# Patient Record
Sex: Female | Born: 1970 | Race: White | Hispanic: No | Marital: Married | Smoking: Never smoker
Health system: Southern US, Community
[De-identification: ages and names within clinical notes are randomized; demographics above are authoritative.]

## PROBLEM LIST (undated history)

## (undated) DIAGNOSIS — Z86711 Personal history of pulmonary embolism: Secondary | ICD-10-CM

## (undated) DIAGNOSIS — G894 Chronic pain syndrome: Secondary | ICD-10-CM

## (undated) DIAGNOSIS — Z86718 Personal history of other venous thrombosis and embolism: Secondary | ICD-10-CM

## (undated) DIAGNOSIS — K589 Irritable bowel syndrome without diarrhea: Secondary | ICD-10-CM

## (undated) DIAGNOSIS — L4050 Arthropathic psoriasis, unspecified: Secondary | ICD-10-CM

## (undated) DIAGNOSIS — G43909 Migraine, unspecified, not intractable, without status migrainosus: Secondary | ICD-10-CM

## (undated) DIAGNOSIS — K219 Gastro-esophageal reflux disease without esophagitis: Secondary | ICD-10-CM

## (undated) DIAGNOSIS — K52839 Microscopic colitis, unspecified: Secondary | ICD-10-CM

## (undated) HISTORY — DX: Personal history of other venous thrombosis and embolism: Z86.718

## (undated) HISTORY — DX: Morbid (severe) obesity due to excess calories: E66.01

## (undated) HISTORY — DX: Personal history of pulmonary embolism: Z86.711

## (undated) HISTORY — DX: Migraine, unspecified, not intractable, without status migrainosus: G43.909

## (undated) HISTORY — DX: Microscopic colitis, unspecified: K52.839

## (undated) HISTORY — DX: Irritable bowel syndrome, unspecified: K58.9

## (undated) HISTORY — PX: ESOPHAGOGASTRODUODENOSCOPY: SHX1529

## (undated) HISTORY — DX: Gastro-esophageal reflux disease without esophagitis: K21.9

## (undated) HISTORY — PX: COLONOSCOPY W/ BIOPSIES: SHX1374

## (undated) HISTORY — DX: Chronic pain syndrome: G89.4

## (undated) HISTORY — DX: Arthropathic psoriasis, unspecified: L40.50

---

## 2015-05-29 DIAGNOSIS — Z800 Family history of malignant neoplasm of digestive organs: Secondary | ICD-10-CM

## 2016-02-27 DIAGNOSIS — M25552 Pain in left hip: Secondary | ICD-10-CM | POA: Diagnosis not present

## 2016-02-27 DIAGNOSIS — M25551 Pain in right hip: Secondary | ICD-10-CM | POA: Diagnosis not present

## 2016-02-27 DIAGNOSIS — F411 Generalized anxiety disorder: Secondary | ICD-10-CM | POA: Diagnosis not present

## 2016-02-28 DIAGNOSIS — L4050 Arthropathic psoriasis, unspecified: Secondary | ICD-10-CM | POA: Diagnosis not present

## 2016-02-28 DIAGNOSIS — K52831 Collagenous colitis: Secondary | ICD-10-CM | POA: Diagnosis not present

## 2016-02-28 DIAGNOSIS — L409 Psoriasis, unspecified: Secondary | ICD-10-CM | POA: Diagnosis not present

## 2016-03-03 DIAGNOSIS — M2550 Pain in unspecified joint: Secondary | ICD-10-CM | POA: Diagnosis not present

## 2016-03-03 DIAGNOSIS — M25551 Pain in right hip: Secondary | ICD-10-CM | POA: Diagnosis not present

## 2016-03-03 DIAGNOSIS — L409 Psoriasis, unspecified: Secondary | ICD-10-CM | POA: Diagnosis not present

## 2016-03-03 DIAGNOSIS — M159 Polyosteoarthritis, unspecified: Secondary | ICD-10-CM | POA: Diagnosis not present

## 2016-03-20 DIAGNOSIS — L400 Psoriasis vulgaris: Secondary | ICD-10-CM | POA: Diagnosis not present

## 2016-03-20 DIAGNOSIS — L408 Other psoriasis: Secondary | ICD-10-CM | POA: Diagnosis not present

## 2016-03-20 DIAGNOSIS — G894 Chronic pain syndrome: Secondary | ICD-10-CM | POA: Diagnosis not present

## 2016-03-20 DIAGNOSIS — Z79899 Other long term (current) drug therapy: Secondary | ICD-10-CM | POA: Diagnosis not present

## 2016-03-24 ENCOUNTER — Encounter: Payer: Self-pay | Admitting: Physician Assistant

## 2016-03-24 ENCOUNTER — Ambulatory Visit (INDEPENDENT_AMBULATORY_CARE_PROVIDER_SITE_OTHER): Payer: BLUE CROSS/BLUE SHIELD | Admitting: Physician Assistant

## 2016-03-24 VITALS — BP 139/83 | HR 106 | Ht 64.0 in | Wt 338.0 lb

## 2016-03-24 DIAGNOSIS — L4050 Arthropathic psoriasis, unspecified: Secondary | ICD-10-CM

## 2016-03-24 DIAGNOSIS — K219 Gastro-esophageal reflux disease without esophagitis: Secondary | ICD-10-CM | POA: Diagnosis not present

## 2016-03-24 DIAGNOSIS — R600 Localized edema: Secondary | ICD-10-CM

## 2016-03-24 DIAGNOSIS — D509 Iron deficiency anemia, unspecified: Secondary | ICD-10-CM | POA: Diagnosis not present

## 2016-03-24 DIAGNOSIS — G44229 Chronic tension-type headache, not intractable: Secondary | ICD-10-CM

## 2016-03-24 DIAGNOSIS — M170 Bilateral primary osteoarthritis of knee: Secondary | ICD-10-CM | POA: Insufficient documentation

## 2016-03-24 DIAGNOSIS — Z13220 Encounter for screening for lipoid disorders: Secondary | ICD-10-CM

## 2016-03-24 DIAGNOSIS — F411 Generalized anxiety disorder: Secondary | ICD-10-CM | POA: Diagnosis not present

## 2016-03-24 DIAGNOSIS — Z86711 Personal history of pulmonary embolism: Secondary | ICD-10-CM

## 2016-03-24 DIAGNOSIS — Z86718 Personal history of other venous thrombosis and embolism: Secondary | ICD-10-CM

## 2016-03-24 DIAGNOSIS — F5101 Primary insomnia: Secondary | ICD-10-CM

## 2016-03-24 DIAGNOSIS — Z131 Encounter for screening for diabetes mellitus: Secondary | ICD-10-CM

## 2016-03-24 HISTORY — DX: Gastro-esophageal reflux disease without esophagitis: K21.9

## 2016-03-24 MED ORDER — DULOXETINE HCL 30 MG PO CPEP: 30.0000 mg | ORAL_CAPSULE | Freq: Every day | ORAL | 1 refills | Status: DC

## 2016-03-24 MED ORDER — BUTALBITAL-APAP-CAFF-COD 50-325-40-30 MG PO CAPS: 1.0000 | ORAL_CAPSULE | ORAL | 2 refills | Status: DC | PRN

## 2016-03-24 MED ORDER — ZOLPIDEM TARTRATE 5 MG PO TABS: 5.0000 mg | ORAL_TABLET | Freq: Every evening | ORAL | 2 refills | Status: DC | PRN

## 2016-03-24 MED ORDER — FERROUS SULFATE 325 (65 FE) MG PO TABS: 325.0000 mg | ORAL_TABLET | Freq: Every day | ORAL | 11 refills | Status: DC

## 2016-03-24 MED ORDER — FUROSEMIDE 20 MG PO TABS: 20.0000 mg | ORAL_TABLET | Freq: Every day | ORAL | 2 refills | Status: DC

## 2016-03-24 NOTE — Patient Instructions (Signed)
Stop paxil. Start cymbalta.  Lasix as needed for swelling.  fiorcet for migraine

## 2016-03-27 DIAGNOSIS — Z86718 Personal history of other venous thrombosis and embolism: Secondary | ICD-10-CM | POA: Insufficient documentation

## 2016-03-27 DIAGNOSIS — G44229 Chronic tension-type headache, not intractable: Secondary | ICD-10-CM | POA: Insufficient documentation

## 2016-03-27 DIAGNOSIS — Z86711 Personal history of pulmonary embolism: Secondary | ICD-10-CM

## 2016-03-27 DIAGNOSIS — R600 Localized edema: Secondary | ICD-10-CM | POA: Insufficient documentation

## 2016-03-27 DIAGNOSIS — F5101 Primary insomnia: Secondary | ICD-10-CM | POA: Insufficient documentation

## 2016-03-27 HISTORY — DX: Personal history of other venous thrombosis and embolism: Z86.718

## 2016-03-27 HISTORY — DX: Personal history of pulmonary embolism: Z86.711

## 2016-03-27 NOTE — Progress Notes (Signed)
Subjective:    Patient ID: Sarah Mayer, female    DOB: [birth year], 46 y.o.   MRN: [id number]  HPI Pt is a 46 yo female who presents to the clinic to establish care.   . Active Ambulatory Problems    Diagnosis Date Noted  . Psoriatic arthritis (HCC) 03/24/2016  . Gastroesophageal reflux disease 03/24/2016  . GAD (generalized anxiety disorder) 03/24/2016  . Iron deficiency anemia 03/24/2016   Resolved Ambulatory Problems    Diagnosis Date Noted  . No Resolved Ambulatory Problems   No Additional Past Medical History   .[name].No family history on file. .. Social History   Social History  . Marital status: Significant Other    Spouse name: N/A  . Number of children: N/A  . Years of education: N/A   Occupational History  . Not on file.   Social History Main Topics  . Smoking status: Never Smoker  . Smokeless tobacco: Never Used  . Alcohol use No  . Drug use: No  . Sexual activity: Not Currently   Other Topics Concern  . Not on file   Social History Narrative  . No narrative on file   Pt would like flu shot.   Pt has worsening anxiety and insomnia she needs refills.   She also has occasional swelling of bilateral extermities.   She just started treatment for psoratic arthritis.    Review of Systems  All other systems reviewed and are negative.      Objective:   Physical Exam  Constitutional: She is oriented to person, place, and time. She appears well-developed and well-nourished.  HENT:  Head: Normocephalic and atraumatic.  Cardiovascular: Normal rate, regular rhythm and normal heart sounds.   Pulmonary/Chest: Effort normal and breath sounds normal.  Neurological: She is alert and oriented to person, place, and time.  Psychiatric: She has a normal mood and affect. Her behavior is normal.          Assessment & Plan:  [name].[name].Sarah Mayer was seen today for establish care.  Diagnoses and all orders for this visit:  Psoriatic arthritis  (HCC)  Gastroesophageal reflux disease, esophagitis presence not specified  GAD (generalized anxiety disorder) -     DULoxetine (CYMBALTA) 30 MG capsule; Take 1 capsule (30 mg total) by mouth daily.  Iron deficiency anemia, unspecified iron deficiency anemia type -     ferrous sulfate 325 (65 FE) MG tablet; Take 1 tablet (325 mg total) by mouth daily with breakfast. -     CBC with Differential/Platelet -     Ferritin  Screening for lipid disorders -     Lipid panel  Screening for diabetes mellitus -     COMPLETE METABOLIC PANEL WITH GFR  Chronic tension-type headache, not intractable -     butalbital-acetaminophen-caffeine (FIORICET WITH CODEINE) 50-325-40-30 MG capsule; Take 1 capsule by mouth every 4 (four) hours as needed for headache.  Bilateral lower extremity edema -     furosemide (LASIX) 20 MG tablet; Take 1 tablet (20 mg total) by mouth daily.  Primary insomnia -     zolpidem (AMBIEN) 5 MG tablet; Take 1 tablet (5 mg total) by mouth at bedtime as needed for sleep.  History of pulmonary embolism  History of DVT (deep vein thrombosis)   Switched paxil out for cymbalta. Hopefully will help with pain as well. Follow up in 4-6 weeks.   Discussed symptomatic care of swelling with leg elevation and compression stockins. Only use lasix as needed.   Discussed as  needed usage of fiorcet for tension headaches. Discussed prevention with stress relief/massage.

## 2016-04-02 ENCOUNTER — Telehealth: Payer: Self-pay | Admitting: Physician Assistant

## 2016-04-02 NOTE — Telephone Encounter (Signed)
Pt called and wanted to make sure its okay for her take  Tapentadol 100mg  2 xday with her Cymbalta because her pain Dr has prescribed Tapentadol for her.?? Please call pt and let her know.  Patient also states she was missing her Ambien Rx that Les[facility] RubensteinJade [facility] [place] [facility] [address]. Patient was seen last week and called back a couple times last week and left a message on Amber's line but states she has not heard anything back.  Please call patient

## 2016-04-06 ENCOUNTER — Other Ambulatory Visit: Payer: Self-pay | Admitting: Physician Assistant

## 2016-04-06 DIAGNOSIS — F5101 Primary insomnia: Secondary | ICD-10-CM

## 2016-04-06 MED ORDER — ZOLPIDEM TARTRATE 5 MG PO TABS: 5.0000 mg | ORAL_TABLET | Freq: Every evening | ORAL | 2 refills | Status: DC | PRN

## 2016-04-06 NOTE — Telephone Encounter (Signed)
Jade can you look into this? 

## 2016-04-06 NOTE — Telephone Encounter (Signed)
Ambien was sent to pharmacy on 03/24/16. Not sure what happened but will sign and can fax again.   Yes ok to take tapentadol with cymbalta.

## 2016-04-06 NOTE — Telephone Encounter (Signed)
Pt informed. Pt expressed understanding and is agreeable. Diona Peregoy CMA, RT 

## 2016-04-08 DIAGNOSIS — H04123 Dry eye syndrome of bilateral lacrimal glands: Secondary | ICD-10-CM | POA: Diagnosis not present

## 2016-04-10 DIAGNOSIS — M533 Sacrococcygeal disorders, not elsewhere classified: Secondary | ICD-10-CM | POA: Diagnosis not present

## 2016-04-10 DIAGNOSIS — Z79899 Other long term (current) drug therapy: Secondary | ICD-10-CM | POA: Diagnosis not present

## 2016-04-10 DIAGNOSIS — G894 Chronic pain syndrome: Secondary | ICD-10-CM | POA: Diagnosis not present

## 2016-04-15 DIAGNOSIS — Z13220 Encounter for screening for lipoid disorders: Secondary | ICD-10-CM | POA: Diagnosis not present

## 2016-04-15 DIAGNOSIS — D509 Iron deficiency anemia, unspecified: Secondary | ICD-10-CM | POA: Diagnosis not present

## 2016-04-15 DIAGNOSIS — Z131 Encounter for screening for diabetes mellitus: Secondary | ICD-10-CM | POA: Diagnosis not present

## 2016-04-16 LAB — COMPLETE METABOLIC PANEL WITH GFR
ALT: 16 U/L (ref 6–29)
AST: 16 U/L (ref 10–30)
Albumin: 4.4 g/dL (ref 3.6–5.1)
Alkaline Phosphatase: 103 U/L (ref 33–115)
BILIRUBIN TOTAL: 0.4 mg/dL (ref 0.2–1.2)
BUN: 13 mg/dL (ref 7–25)
CO2: 22 mmol/L (ref 20–31)
Calcium: 9.5 mg/dL (ref 8.6–10.2)
Chloride: 107 mmol/L (ref 98–110)
Creat: 0.91 mg/dL (ref 0.50–1.10)
GFR, EST NON AFRICAN AMERICAN: 77 mL/min (ref >=60)
GFR, Est African American: 89 mL/min (ref >=60)
GLUCOSE: 90 mg/dL (ref 65–99)
Potassium: 4.8 mmol/L (ref 3.5–5.3)
SODIUM: 142 mmol/L (ref 135–146)
TOTAL PROTEIN: 6.6 g/dL (ref 6.1–8.1)

## 2016-04-16 LAB — CBC WITH DIFFERENTIAL/PLATELET
BASOS PCT: 0 %
Basophils Absolute: 0 cells/uL (ref 0–200)
EOS ABS: 180 {cells}/uL (ref 15–500)
Eosinophils Relative: 2 %
HCT: 39.4 % (ref 35.0–45.0)
HEMOGLOBIN: 12.5 g/dL (ref 11.7–15.5)
LYMPHS ABS: 2250 {cells}/uL (ref 850–3900)
Lymphocytes Relative: 25 %
MCH: 25.7 pg — ABNORMAL LOW (ref 27.0–33.0)
MCHC: 31.7 g/dL — ABNORMAL LOW (ref 32.0–36.0)
MCV: 80.9 fL (ref 80.0–100.0)
MONO ABS: 630 {cells}/uL (ref 200–950)
MPV: 10.8 fL (ref 7.5–12.5)
Monocytes Relative: 7 %
NEUTROS ABS: 5940 {cells}/uL (ref 1500–7800)
Neutrophils Relative %: 66 %
Platelets: 355 10*3/uL (ref 140–400)
RBC: 4.87 MIL/uL (ref 3.80–5.10)
RDW: 19.4 % — ABNORMAL HIGH (ref 11.0–15.0)
WBC: 9 10*3/uL (ref 3.8–10.8)

## 2016-04-16 LAB — LIPID PANEL
Cholesterol: 143 mg/dL (ref <200)
HDL: 60 mg/dL (ref >50)
LDL CALC: 70 mg/dL (ref <100)
Total CHOL/HDL Ratio: 2.4 Ratio (ref <5.0)
Triglycerides: 67 mg/dL (ref <150)
VLDL: 13 mg/dL (ref <30)

## 2016-04-16 LAB — FERRITIN: Ferritin: 26 ng/mL (ref 10–232)

## 2016-04-30 DIAGNOSIS — Z6841 Body Mass Index (BMI) 40.0 and over, adult: Secondary | ICD-10-CM | POA: Diagnosis not present

## 2016-04-30 DIAGNOSIS — M170 Bilateral primary osteoarthritis of knee: Secondary | ICD-10-CM | POA: Diagnosis not present

## 2016-04-30 DIAGNOSIS — M160 Bilateral primary osteoarthritis of hip: Secondary | ICD-10-CM | POA: Diagnosis not present

## 2016-05-15 DIAGNOSIS — M533 Sacrococcygeal disorders, not elsewhere classified: Secondary | ICD-10-CM | POA: Diagnosis not present

## 2016-05-15 DIAGNOSIS — G894 Chronic pain syndrome: Secondary | ICD-10-CM | POA: Diagnosis not present

## 2016-05-15 DIAGNOSIS — Z79899 Other long term (current) drug therapy: Secondary | ICD-10-CM | POA: Diagnosis not present

## 2016-05-16 ENCOUNTER — Other Ambulatory Visit: Payer: Self-pay | Admitting: Physician Assistant

## 2016-05-16 DIAGNOSIS — F411 Generalized anxiety disorder: Secondary | ICD-10-CM

## 2016-05-18 ENCOUNTER — Other Ambulatory Visit: Payer: Self-pay | Admitting: *Deleted

## 2016-05-18 DIAGNOSIS — F411 Generalized anxiety disorder: Secondary | ICD-10-CM

## 2016-05-18 MED ORDER — DULOXETINE HCL 30 MG PO CPEP: 30.0000 mg | ORAL_CAPSULE | Freq: Every day | ORAL | 0 refills | Status: DC

## 2016-05-25 ENCOUNTER — Ambulatory Visit (INDEPENDENT_AMBULATORY_CARE_PROVIDER_SITE_OTHER): Payer: BLUE CROSS/BLUE SHIELD | Admitting: Physician Assistant

## 2016-05-25 ENCOUNTER — Encounter: Payer: Self-pay | Admitting: Physician Assistant

## 2016-05-25 VITALS — BP 135/84 | HR 87 | Ht 64.0 in | Wt 346.0 lb

## 2016-05-25 DIAGNOSIS — M797 Fibromyalgia: Secondary | ICD-10-CM | POA: Diagnosis not present

## 2016-05-25 DIAGNOSIS — L4050 Arthropathic psoriasis, unspecified: Secondary | ICD-10-CM

## 2016-05-25 DIAGNOSIS — Z8349 Family history of other endocrine, nutritional and metabolic diseases: Secondary | ICD-10-CM

## 2016-05-25 DIAGNOSIS — R5383 Other fatigue: Secondary | ICD-10-CM

## 2016-05-25 DIAGNOSIS — G894 Chronic pain syndrome: Secondary | ICD-10-CM

## 2016-05-25 DIAGNOSIS — N898 Other specified noninflammatory disorders of vagina: Secondary | ICD-10-CM

## 2016-05-25 DIAGNOSIS — F411 Generalized anxiety disorder: Secondary | ICD-10-CM | POA: Diagnosis not present

## 2016-05-25 DIAGNOSIS — E559 Vitamin D deficiency, unspecified: Secondary | ICD-10-CM

## 2016-05-25 DIAGNOSIS — L298 Other pruritus: Secondary | ICD-10-CM | POA: Diagnosis not present

## 2016-05-25 HISTORY — DX: Chronic pain syndrome: G89.4

## 2016-05-25 LAB — WET PREP FOR TRICH, YEAST, CLUE
Trich, Wet Prep: NONE SEEN
Yeast Wet Prep HPF POC: NONE SEEN

## 2016-05-25 MED ORDER — FLUCONAZOLE 150 MG PO TABS: 150.0000 mg | ORAL_TABLET | Freq: Once | ORAL | 0 refills | Status: AC

## 2016-05-25 NOTE — Progress Notes (Signed)
   Subjective:    Patient ID: Sarah Mayer, female    DOB: 1970/03/23, 46 y.o.   MRN: [id number]  HPI  Pt is a 46 yo morbidly obese female with psoratic arthritis, CPS, and fibromyalgia who presents to the clinic for follow up.   She was seen by neurology and continues to treat with injections but no diagnoses. Neurologist was supposed to make pain clinic referral but was not made. Started on nucynta and gabapentin with some relief. She is concerned because she has gained 14lbs in the last few months since starting this other medication.   She remains on cymbalta. She thinks maybe has helped with mood but not pain. She denies any side effects.   She is having some vaginal itching and discharge. Feels like yeast since just finished antibiotic. No fever, chills, abdominal pain. One sexual partner. No abdominal pain or dysuria.   She has no energy and wants some things checked to make sure she is in normal range. She is sleeping as long as she has [place].    Review of Systems See HPI.     Objective:   Physical Exam  Constitutional: She is oriented to person, place, and time. She appears well-developed and well-nourished.  Morbid obesity.   HENT:  Head: Normocephalic and atraumatic.  Cardiovascular: Normal rate, regular rhythm and normal heart sounds.   Pulmonary/Chest: Effort normal and breath sounds normal.  Abdominal: Soft. Bowel sounds are normal. She exhibits no distension and no mass. There is no tenderness. There is no rebound and no guarding.  Neurological: She is alert and oriented to person, place, and time.  Psychiatric: She has a normal mood and affect. Her behavior is normal.          Assessment & Plan:  Marland KitchenMarland KitchenDiagnoses and all orders for this visit:  Fibromyalgia  Vitamin D deficiency -     Vitamin D 1,25 dihydroxy  Family history of thyroid disease -     TSH -     T4, free  No energy -     B12 -     Vitamin D 1,25 dihydroxy -     TSH -     T4,  free  Psoriatic arthritis (HCC)  GAD (generalized anxiety disorder)  Chronic pain syndrome  Morbid obesity (HCC)  Vaginal itching -     WET PREP FOR TRICH, YEAST, CLUE  Vaginal discharge  Other orders -     fluconazole (DIFLUCAN) 150 MG tablet; Take 1 tablet (150 mg total) by mouth once. -     metroNIDAZOLE (FLAGYL) 500 MG tablet; Take 1 tablet (500 mg total) by mouth 2 (two) times daily. For 7 days.  rheumatology manages psoriatic arthritis.   Get labs to evaluate energy level.  Increased cymbalta to .  Will call with wet prep. Sent diflucan due to hx of just finishing a abx and having hx of yeast infection after abx exposure.   Referral made to pain clinic. Please refer to neurologist/rheumatologist notes for need for pain clinic.   Call insurance and look into belviq/saxenda for weight loss.   Follow up in 3 months.

## 2016-05-25 NOTE — Patient Instructions (Signed)
Get labs.  Increase cymbalta to  daily.    saxenda and belviq

## 2016-05-26 LAB — VITAMIN B12: VITAMIN B 12: 466 pg/mL (ref 200–1100)

## 2016-05-26 LAB — TSH: TSH: 0.59 mIU/L

## 2016-05-26 LAB — T4, FREE: Free T4: 1.2 ng/dL (ref 0.8–1.8)

## 2016-05-26 MED ORDER — METRONIDAZOLE 500 MG PO TABS: 500.0000 mg | ORAL_TABLET | Freq: Two times a day (BID) | ORAL | 0 refills | Status: DC

## 2016-05-27 HISTORY — DX: Morbid (severe) obesity due to excess calories: E66.01

## 2016-05-28 ENCOUNTER — Telehealth: Payer: Self-pay | Admitting: *Deleted

## 2016-05-28 ENCOUNTER — Other Ambulatory Visit: Payer: Self-pay | Admitting: Physician Assistant

## 2016-05-28 LAB — VITAMIN D 1,25 DIHYDROXY
Vitamin D 1, 25 (OH)2 Total: 52 pg/mL (ref 18–72)
Vitamin D2 1, 25 (OH)2: <8 pg/mL
Vitamin D3 1, 25 (OH)2: 52 pg/mL

## 2016-05-28 MED ORDER — LIRAGLUTIDE -WEIGHT MANAGEMENT 18 MG/3ML ~~LOC~~ SOPN
0.6000 mg | PEN_INJECTOR | Freq: Every day | SUBCUTANEOUS | 1 refills | Status: DC
Start: 2016-05-28 — End: 2016-08-25

## 2016-05-28 NOTE — Telephone Encounter (Signed)
saxenda sent. Explain taper up weekly start at .  and then end at . Nausea is biggest side effect. This is a daily injectable.

## 2016-05-28 NOTE — Telephone Encounter (Signed)
Patient called and states her insurance company will cover belviq and saxenda. She is ok to try either one. Please send prescription to her pharmacy.

## 2016-05-29 NOTE — Telephone Encounter (Signed)
Left message on patient's vm with instruction

## 2016-06-01 DIAGNOSIS — M2550 Pain in unspecified joint: Secondary | ICD-10-CM | POA: Diagnosis not present

## 2016-06-02 ENCOUNTER — Telehealth: Payer: Self-pay | Admitting: Physician Assistant

## 2016-06-02 NOTE — Telephone Encounter (Signed)
Sent pa through cover my meds waiting on determination - CF °

## 2016-06-04 NOTE — Telephone Encounter (Signed)
I checked Prior auth on cover my meds and medication has been approved I will call pharmacy and patient to let them know. - CF

## 2016-06-04 NOTE — Telephone Encounter (Signed)
Received [facility] from [facility] Saxenda was approved from 05/03/16 - 09/30/16. PA# [employer] [place] [id number] [facility] was notified and patient picked medication up - CF

## 2016-06-05 DIAGNOSIS — L400 Psoriasis vulgaris: Secondary | ICD-10-CM | POA: Diagnosis not present

## 2016-06-05 DIAGNOSIS — Z79899 Other long term (current) drug therapy: Secondary | ICD-10-CM | POA: Diagnosis not present

## 2016-06-12 ENCOUNTER — Telehealth: Payer: Self-pay | Admitting: *Deleted

## 2016-06-12 MED ORDER — AMBULATORY NON FORMULARY MEDICATION: 1 refills | Status: DC

## 2016-06-12 NOTE — Telephone Encounter (Signed)
Pen needles for saxenda

## 2016-06-16 DIAGNOSIS — G894 Chronic pain syndrome: Secondary | ICD-10-CM | POA: Diagnosis not present

## 2016-06-16 DIAGNOSIS — Z79899 Other long term (current) drug therapy: Secondary | ICD-10-CM | POA: Diagnosis not present

## 2016-06-16 DIAGNOSIS — M533 Sacrococcygeal disorders, not elsewhere classified: Secondary | ICD-10-CM | POA: Diagnosis not present

## 2016-07-06 ENCOUNTER — Other Ambulatory Visit: Payer: Self-pay | Admitting: Physician Assistant

## 2016-07-06 MED ORDER — DULOXETINE HCL 60 MG PO CPEP: 60.0000 mg | ORAL_CAPSULE | Freq: Every day | ORAL | 0 refills | Status: DC

## 2016-07-06 MED ORDER — DULOXETINE HCL 30 MG PO CPEP: 30.0000 mg | ORAL_CAPSULE | Freq: Every day | ORAL | 0 refills | Status: DC

## 2016-07-06 NOTE — Telephone Encounter (Signed)
Rx sent. Pt advised. Follow up appt made.

## 2016-07-06 NOTE — Telephone Encounter (Signed)
Ok to send cymbalta 90mg  daily for one month. Follow up and lets see if helping. I would like office visit to discuss progress.

## 2016-07-06 NOTE — Telephone Encounter (Signed)
Pt is calling for a refill on her Cymbalta, at last OV she was increased to 60mg . Pt states she had doubled up on her 30mg  tablets in the meantime. She does not notice a major difference. Reports she is still crying "very often" and does not know if PCP wants to try to increase the dose again or maintain at current level. Pended Rx refill for PCP at 60mg  dose.

## 2016-07-13 ENCOUNTER — Other Ambulatory Visit: Payer: Self-pay | Admitting: Physician Assistant

## 2016-07-13 ENCOUNTER — Other Ambulatory Visit: Payer: Self-pay | Admitting: *Deleted

## 2016-07-13 DIAGNOSIS — R600 Localized edema: Secondary | ICD-10-CM

## 2016-07-13 MED ORDER — DULOXETINE HCL 30 MG PO CPEP: 30.0000 mg | ORAL_CAPSULE | Freq: Every day | ORAL | 0 refills | Status: DC

## 2016-07-13 MED ORDER — DULOXETINE HCL 60 MG PO CPEP: 60.0000 mg | ORAL_CAPSULE | Freq: Every day | ORAL | 0 refills | Status: DC

## 2016-07-14 DIAGNOSIS — M533 Sacrococcygeal disorders, not elsewhere classified: Secondary | ICD-10-CM | POA: Diagnosis not present

## 2016-07-14 DIAGNOSIS — Z79899 Other long term (current) drug therapy: Secondary | ICD-10-CM | POA: Diagnosis not present

## 2016-07-14 DIAGNOSIS — G894 Chronic pain syndrome: Secondary | ICD-10-CM | POA: Diagnosis not present

## 2016-07-17 ENCOUNTER — Other Ambulatory Visit: Payer: Self-pay | Admitting: Physician Assistant

## 2016-07-17 DIAGNOSIS — F5101 Primary insomnia: Secondary | ICD-10-CM

## 2016-07-30 ENCOUNTER — Other Ambulatory Visit: Payer: Self-pay | Admitting: Physician Assistant

## 2016-08-03 ENCOUNTER — Ambulatory Visit: Payer: BLUE CROSS/BLUE SHIELD | Admitting: Physician Assistant

## 2016-08-10 DIAGNOSIS — M545 Low back pain: Secondary | ICD-10-CM | POA: Diagnosis not present

## 2016-08-10 DIAGNOSIS — M8589 Other specified disorders of bone density and structure, multiple sites: Secondary | ICD-10-CM | POA: Diagnosis not present

## 2016-08-10 DIAGNOSIS — Z79899 Other long term (current) drug therapy: Secondary | ICD-10-CM | POA: Diagnosis not present

## 2016-08-10 DIAGNOSIS — M25551 Pain in right hip: Secondary | ICD-10-CM | POA: Diagnosis not present

## 2016-08-10 DIAGNOSIS — M5136 Other intervertebral disc degeneration, lumbar region: Secondary | ICD-10-CM | POA: Diagnosis not present

## 2016-08-13 ENCOUNTER — Ambulatory Visit: Payer: PRIVATE HEALTH INSURANCE

## 2016-08-14 ENCOUNTER — Telehealth: Payer: Self-pay | Admitting: Physician Assistant

## 2016-08-14 DIAGNOSIS — L4050 Arthropathic psoriasis, unspecified: Secondary | ICD-10-CM

## 2016-08-14 NOTE — Telephone Encounter (Signed)
Pt called and stated she would like to switch rheumatologist because she does not like the one she is currently going to. She would like a referral to another Rheumatology office and was recommended Dr. Corliss Skainseveshwar in [place]. Thanks

## 2016-08-17 NOTE — Telephone Encounter (Signed)
Ok for referral to be made and let patient know to except a call.

## 2016-08-21 ENCOUNTER — Telehealth: Payer: Self-pay | Admitting: Rheumatology

## 2016-08-21 NOTE — Telephone Encounter (Signed)
I have not seen a referral on this patient.

## 2016-08-21 NOTE — Addendum Note (Signed)
Addended by: Donne AnonBENDER, Abigaile Rossie L on: 08/21/2016 10:30 AM   Modules accepted: Orders

## 2016-08-21 NOTE — Telephone Encounter (Signed)
Done

## 2016-08-21 NOTE — Telephone Encounter (Signed)
Patient called to check the status of his referral

## 2016-08-23 ENCOUNTER — Other Ambulatory Visit: Payer: Self-pay | Admitting: Family Medicine

## 2016-08-24 ENCOUNTER — Ambulatory Visit (INDEPENDENT_AMBULATORY_CARE_PROVIDER_SITE_OTHER): Payer: BLUE CROSS/BLUE SHIELD | Admitting: Physician Assistant

## 2016-08-24 ENCOUNTER — Encounter: Payer: Self-pay | Admitting: Physician Assistant

## 2016-08-24 VITALS — BP 150/85 | HR 92 | Temp 98.5°F | Ht 64.0 in | Wt 327.0 lb

## 2016-08-24 DIAGNOSIS — R197 Diarrhea, unspecified: Secondary | ICD-10-CM

## 2016-08-24 LAB — CBC WITH DIFFERENTIAL/PLATELET
BASOS ABS: 64 {cells}/uL (ref 0–200)
Basophils Relative: 1 %
EOS ABS: 320 {cells}/uL (ref 15–500)
Eosinophils Relative: 5 %
HCT: 42.7 % (ref 35.0–45.0)
HEMOGLOBIN: 14 g/dL (ref 11.7–15.5)
LYMPHS ABS: 2624 {cells}/uL (ref 850–3900)
Lymphocytes Relative: 41 %
MCH: 28.9 pg (ref 27.0–33.0)
MCHC: 32.8 g/dL (ref 32.0–36.0)
MCV: 88 fL (ref 80.0–100.0)
MONOS PCT: 15 %
MPV: 10.7 fL (ref 7.5–12.5)
Monocytes Absolute: 960 cells/uL — ABNORMAL HIGH (ref 200–950)
NEUTROS ABS: 2432 {cells}/uL (ref 1500–7800)
NEUTROS PCT: 38 %
Platelets: 404 10*3/uL — ABNORMAL HIGH (ref 140–400)
RBC: 4.85 MIL/uL (ref 3.80–5.10)
RDW: 14 % (ref 11.0–15.0)
WBC: 6.4 10*3/uL (ref 3.8–10.8)

## 2016-08-24 LAB — COMPREHENSIVE METABOLIC PANEL
ALBUMIN: 3.9 g/dL (ref 3.6–5.1)
ALT: 18 U/L (ref 6–29)
AST: 16 U/L (ref 10–35)
Alkaline Phosphatase: 103 U/L (ref 33–115)
BUN: 8 mg/dL (ref 7–25)
CALCIUM: 10.7 mg/dL — AB (ref 8.6–10.2)
CHLORIDE: 103 mmol/L (ref 98–110)
CO2: 25 mmol/L (ref 20–31)
Creat: 0.87 mg/dL (ref 0.50–1.10)
Glucose, Bld: 97 mg/dL (ref 65–99)
POTASSIUM: 3.7 mmol/L (ref 3.5–5.3)
Sodium: 139 mmol/L (ref 135–146)
TOTAL PROTEIN: 6.6 g/dL (ref 6.1–8.1)
Total Bilirubin: 0.3 mg/dL (ref 0.2–1.2)

## 2016-08-24 MED ORDER — DIPHENOXYLATE-ATROPINE 2.5-0.025 MG PO TABS: ORAL_TABLET | ORAL | 3 refills | Status: DC

## 2016-08-24 MED ORDER — CULTURELLE DIGESTIVE HEALTH PO CAPS: 1.0000 | ORAL_CAPSULE | Freq: Every day | ORAL | 11 refills | Status: DC

## 2016-08-24 NOTE — Patient Instructions (Addendum)
-   Plan to go downstairs to the lab for blood work & stool studies today - Start daily probiotic - Take Lomotil 1-2 tablets every 6 hours as needed for diarrhea - Stop Imodium - Bland diet for the next 3 days or until you have a solid bowel movement - Follow-up with GI doctor   Parke SimmersBland Diet A bland diet consists of foods that do not have a lot of fat or fiber. Foods without fat or fiber are easier for the body to digest. They are also less likely to irritate your mouth, throat, stomach, and other parts of your gastrointestinal tract. A bland diet is sometimes called a BRAT diet. What is my plan? Your health care provider or dietitian may recommend specific changes to your diet to prevent and treat your symptoms, such as:  Eating small meals often.  Cooking food until it is soft enough to chew easily.  Chewing your food well.  Drinking fluids slowly.  Not eating foods that are very spicy, sour, or fatty.  Not eating citrus fruits, such as oranges and grapefruit.  What do I need to know about this diet?  Eat a variety of foods from the bland diet food list.  Do not follow a bland diet longer than you have to.  Ask your health care provider whether you should take vitamins. What foods can I eat? Grains  Hot cereals, such as cream of wheat. Bread, crackers, or tortillas made from refined white flour. Rice. Vegetables Canned or cooked vegetables. Mashed or boiled potatoes. Fruits Bananas. Applesauce. Other types of cooked or canned fruit with the skin and seeds removed, such as canned peaches or pears. Meats and Other Protein Sources Scrambled eggs. Creamy peanut butter or other nut butters. Lean, well-cooked meats, such as chicken or fish. Tofu. Soups or broths. Dairy Low-fat dairy products, such as milk, cottage cheese, or yogurt. Beverages Water. Herbal tea. Apple juice. Sweets and Desserts Pudding. Custard. Fruit gelatin. Ice cream. Fats and Oils Mild salad dressings.  Canola or olive oil. The items listed above may not be a complete list of allowed foods or beverages. Contact your dietitian for more options. What foods are not recommended? Foods and ingredients that are often not recommended include:  Spicy foods, such as hot sauce or salsa.  Fried foods.  Sour foods, such as pickled or fermented foods.  Raw vegetables or fruits, especially citrus or berries.  Caffeinated drinks.  Alcohol.  Strongly flavored seasonings or condiments.  The items listed above may not be a complete list of foods and beverages that are not allowed. Contact your dietitian for more information. This information is not intended to replace advice given to you by your health care provider. Make sure you discuss any questions you have with your health care provider. Document Released: 06/03/2015 Document Revised: 07/18/2015 Document Reviewed: 02/21/2014 Elsevier Interactive Patient Education  2018 [employer]

## 2016-08-24 NOTE — Progress Notes (Signed)
HPI:                                                                Sarah Mayer is a 46 y.o. female who presents to [facility] today for diarrhea  Patient with PMH collagenous colitis, IBS with diarrhea, GERD, and psoriatic arthritis on Humira reports diarrhea x 1.5 months. Diarrhea is mostly watery. Denies blood or mucus. Denies nausea, vomiting or abdominal pain. Endorses intermittent low-grade fevers. She is on Humira for psoriatic arthritis. She states diarrhea has not stopped since doing a bowel prep for a colonoscopy. Reports she has been taking Imodium without relief. She denies recent antibiotic use. No recent hospitalizations or potential exposures to C. Diff.    Past Medical History:  Diagnosis Date  . Chronic pain syndrome 05/25/2016  . Gastroesophageal reflux disease 03/24/2016  . History of DVT (deep vein thrombosis) 03/27/2016  . History of pulmonary embolism 03/27/2016  . IBS (irritable bowel syndrome)   . Microscopic colitis   . Migraine   . Morbid obesity (HCC) 05/27/2016  . Psoriatic arthritis [facility])    Past Surgical History:  Procedure Laterality Date  . COLONOSCOPY W/ BIOPSIES    . ESOPHAGOGASTRODUODENOSCOPY     Social History  Substance Use Topics  . Smoking status: Never Smoker  . Smokeless tobacco: Never Used  . Alcohol use No   family history is not on file.  ROS: Review of Systems  Constitutional: Positive for fever. Negative for chills, malaise/fatigue and weight loss.  HENT: Negative.   Respiratory: Negative.   Cardiovascular: Negative.   Gastrointestinal: Positive for diarrhea. Negative for blood in stool, melena, nausea and vomiting.  Skin: Positive for rash ("cat scratch").  Neurological: Negative.      Medications: Current Outpatient Prescriptions  Medication Sig Dispense Refill  . Adalimumab 40 MG/0.8ML PNKT Inject 40 mg into the skin.    [name] AMBULATORY NON FORMULARY MEDICATION Ultra Fine  6mm  pen needles to be used with Saxenda 100 each 1  . butalbital-acetaminophen-caffeine (FIORICET WITH CODEINE) 50-325-40-30 MG capsule Take 1 capsule by mouth every 4 (four) hours as needed for headache. 30 capsule 2  . Cholecalciferol (VITAMIN D3) 5000 units CAPS Take by mouth.    . Cyanocobalamin (B-12) 1000 MCG CAPS Take 1 tablet by mouth daily.    . cyclobenzaprine (FLEXERIL) 10 MG tablet Take 5-10 mg by mouth.    . DULoxetine (CYMBALTA) 30 MG capsule Take 1 capsule (30 mg total) by mouth daily. Total 90mg  daily. 90 capsule 0  . DULoxetine (CYMBALTA) 60 MG capsule Take 1 capsule (60 mg total) by mouth daily. Total 90mg  daily. 90 capsule 0  . ferrous sulfate 325 (65 FE) MG tablet Take 1 tablet (325 mg total) by mouth daily with breakfast. 30 tablet 11  . fexofenadine (ALLEGRA) 180 MG tablet Take 180 mg by mouth.    . furosemide (LASIX) 20 MG tablet TAKE 1 TABLET (20 MG TOTAL) BY MOUTH DAILY. 30 tablet 2  . gabapentin (NEURONTIN) 300 MG capsule Take 600 mg by mouth.    [name] HYDROcodone-acetaminophen (NORCO/VICODIN) 5-325 MG tablet Take by mouth.    Raylene Miyamoto ER 100 MG 12 hr tablet     . omeprazole (PRILOSEC) 20 MG capsule Take  20 mg by mouth.    . zolpidem (AMBIEN) 5 MG tablet TAKE 1 TABLET BY MOUTH AT BEDTIME AS NEEDED 30 tablet 2  . diphenoxylate-atropine (LOMOTIL) 2.5-0.025 MG tablet One to 2 tablets by mouth 4 times a day as needed for diarrhea. 30 tablet 3  . Lactobacillus-Inulin (CULTURELLE DIGESTIVE HEALTH) CAPS Take 1 capsule by mouth daily. 30 capsule 11  . SAXENDA 18 MG/3ML SOPN INJECT 0.6 MG INTO THE SKIN FOR 1 WEEK, THEN INCREASE BY 0.6 MG WEEKLY UNTIL REACHES 3MG  15 mL 1   No current facility-administered medications for this visit.    No Known Allergies     Objective:  BP (!) 150/85   Pulse 92   Temp 98.5 F (36.9 C)   Ht 5\' 4"  (1.626 m)   Wt (!) 327 lb (148.3 kg)   BMI 56.13 kg/m  Gen: well-groomed, cooperative, morbidly obese, not ill-appearing, no distress HEENT:  normal conjunctiva, no icterus, oropharynx clear, moist mucus membranes, neck supple, trachea midline Pulm: Normal work of breathing, normal phonation, clear to auscultation bilaterally, no wheezes, rales or rhonchi CV: Normal rate, regular rhythm, s1 and s2 distinct, no murmurs, clicks or rubs  GI: abdomen obese, soft, nondistended, nontender Neuro: alert and oriented x 3, EOM's intact, no tremor MSK: moving all extremities, normal gait and station, no peripheral edema Lymph: no cervical or tonsillar adenopathy Skin: warm, dry, intact; multiple 0.5cm excoriated lesions on left arm   No results found for this or any previous visit (from the past 72 hour(s)). No results found.    Assessment and Plan: 46 y.o. female with   1. Diarrhea, unspecified type - checking CBC, CMP, stool culture, ova/parasite, and C. Diff PCR - stopping Imodium and starting Lomotil - daily probiotic - bland diet for the next 72 hours or until normal stooling - follow-up with GI doctor  Patient education and anticipatory guidance given Patient agrees with treatment plan Follow-up as needed if symptoms worsen or fail to improve  Levonne Hubertharley E. Cummings PA-C

## 2016-08-25 ENCOUNTER — Telehealth: Payer: Self-pay

## 2016-08-25 ENCOUNTER — Other Ambulatory Visit: Payer: Self-pay | Admitting: Family Medicine

## 2016-08-25 NOTE — Telephone Encounter (Signed)
New Rx sent to pharmacy this AM.  Pt is able to pick up med from pharmacy. -EH/RMA

## 2016-08-25 NOTE — Telephone Encounter (Signed)
This was not a PA issue.  Rx was sent electronically and denied.  Pt is approved for saxenda til 09/23/16. -EH/RMA

## 2016-08-25 NOTE — Telephone Encounter (Signed)
-----   Message from Charley Elizabeth Cummings, PA-C sent at 08/24/2016  4:12 PM EDT ----- Regarding: PA HI Ev, Patient states her Saxenda was denied. Can you look into this? Thanks!! 

## 2016-08-25 NOTE — Telephone Encounter (Signed)
Thank you :)

## 2016-08-25 NOTE — Telephone Encounter (Deleted)
-----   Message from [facility] Cummings, [place]-C sent at 08/24/2016  4:12 PM EDT ----- Regarding: PA HI Ev, Patient states her [facility] [employer]axenda was denied. Can you look into this? Thanks!!

## 2016-08-25 NOTE — Telephone Encounter (Signed)
Okay, thanks! I'm still not sure what that means. Can we look into why the patient isn't able to get her medication?

## 2016-08-27 NOTE — Progress Notes (Signed)
1. Calcium is elevated. *Ask if she is taking a calcium supplement? If not, I would like her to return for additional blood work 2. Blood counts are normal Still awaiting stool studies

## 2016-08-30 ENCOUNTER — Encounter: Payer: Self-pay | Admitting: Physician Assistant

## 2016-08-30 DIAGNOSIS — R197 Diarrhea, unspecified: Secondary | ICD-10-CM | POA: Insufficient documentation

## 2016-09-04 DIAGNOSIS — L400 Psoriasis vulgaris: Secondary | ICD-10-CM | POA: Diagnosis not present

## 2016-09-04 DIAGNOSIS — Z79899 Other long term (current) drug therapy: Secondary | ICD-10-CM | POA: Diagnosis not present

## 2016-09-04 DIAGNOSIS — T07XXXA Unspecified multiple injuries, initial encounter: Secondary | ICD-10-CM | POA: Diagnosis not present

## 2016-09-09 ENCOUNTER — Other Ambulatory Visit: Payer: Self-pay | Admitting: Physician Assistant

## 2016-09-09 DIAGNOSIS — R197 Diarrhea, unspecified: Secondary | ICD-10-CM | POA: Diagnosis not present

## 2016-09-10 LAB — OVA AND PARASITE EXAMINATION: OP: NONE SEEN

## 2016-09-11 DIAGNOSIS — G894 Chronic pain syndrome: Secondary | ICD-10-CM | POA: Diagnosis not present

## 2016-09-11 DIAGNOSIS — M533 Sacrococcygeal disorders, not elsewhere classified: Secondary | ICD-10-CM | POA: Diagnosis not present

## 2016-09-11 DIAGNOSIS — Z79899 Other long term (current) drug therapy: Secondary | ICD-10-CM | POA: Diagnosis not present

## 2016-09-11 LAB — CLOSTRIDIUM DIFFICILE BY PCR: Toxigenic C. Difficile by PCR: NOT DETECTED

## 2016-09-12 LAB — CALCIUM, IONIZED: CALCIUM ION: 6 mg/dL — AB (ref 4.8–5.6)

## 2016-09-13 LAB — STOOL CULTURE

## 2016-09-13 NOTE — Progress Notes (Signed)
Stool studies are negative. Diarrhea is not infectious. Likely colitis or IBS flare. Recommend follow-up with her GI doctor

## 2016-09-16 DIAGNOSIS — Z800 Family history of malignant neoplasm of digestive organs: Secondary | ICD-10-CM | POA: Diagnosis not present

## 2016-09-16 DIAGNOSIS — K2270 Barrett's esophagus without dysplasia: Secondary | ICD-10-CM | POA: Diagnosis not present

## 2016-09-16 DIAGNOSIS — K580 Irritable bowel syndrome with diarrhea: Secondary | ICD-10-CM | POA: Diagnosis not present

## 2016-09-16 DIAGNOSIS — K219 Gastro-esophageal reflux disease without esophagitis: Secondary | ICD-10-CM | POA: Diagnosis not present

## 2016-09-16 LAB — PTH, INTACT AND CALCIUM: Calcium: 10.4 mg/dL — ABNORMAL HIGH (ref 8.6–10.2)

## 2016-09-16 NOTE — Progress Notes (Signed)
Please let patient know, there was a problem with handling her blood sample on the lab's end, and unfortunately her parathyroid hormone could not be resulted The lab should have contacted her about this She will unfortunately need to undergo another blood draw. However, this should be at no charge to her because of the lab error I will place the order again and she can pick up her lab slip from our front desk

## 2016-09-17 ENCOUNTER — Telehealth: Payer: Self-pay | Admitting: *Deleted

## 2016-09-17 ENCOUNTER — Other Ambulatory Visit: Payer: Self-pay | Admitting: Physician Assistant

## 2016-09-17 DIAGNOSIS — E210 Primary hyperparathyroidism: Secondary | ICD-10-CM

## 2016-09-17 NOTE — Telephone Encounter (Signed)
Called [employer] our Account representative and left a detailed VM informing her that this pt's labwork was not performed due to an error on the labs part. And since the lab made this error it is their responsibility to contact the patient and rectify the situation at no cost to her. To our knowledge this has not been done and this is why I was reaching out to her to see if she could look into this matter for us. I left the pt's name, DOB, collection date, accession #, and pcp's name. Asked that she call back regarding this.Sarah Mayer, Sarah Mayer Sarah Mayer

## 2016-09-17 NOTE — Telephone Encounter (Signed)
Sarah Mayer from [facility] reports that PTH couldn't be ran due to their mistake. He did say the the calcium will be ran and resulted. -EH/RMA

## 2016-09-18 DIAGNOSIS — E210 Primary hyperparathyroidism: Secondary | ICD-10-CM | POA: Insufficient documentation

## 2016-09-18 LAB — PTH, INTACT AND CALCIUM
CALCIUM: 9.7 mg/dL (ref 8.6–10.2)
PTH: 125 pg/mL — AB (ref 14–64)

## 2016-09-18 NOTE — Progress Notes (Signed)
Labs show that parathyroid hormone is elevated. This is the cause of the high calcium. I am going to refer you to endocrinology for further management

## 2016-09-18 NOTE — Addendum Note (Signed)
Addended by: Gena FrayUMMINGS, Zorah Backes E on: 09/18/2016 03:01 PM   Modules accepted: Orders

## 2016-09-23 ENCOUNTER — Telehealth: Payer: Self-pay | Admitting: Endocrinology

## 2016-09-23 NOTE — Telephone Encounter (Signed)
OK, can I please work the afternoon of 10/08/16?

## 2016-09-23 NOTE — Telephone Encounter (Signed)
Referring provider for Endo referral calling to see about getting patient in sooner. Stated she was unsure if patient's issue was worth having them come in sooner, but wanted to at least reach out and check with Endo providers.

## 2016-09-23 NOTE — Telephone Encounter (Signed)
Dr. Everardo AllEllison would you be willing to work this NP in to the week of your return?

## 2016-09-24 DIAGNOSIS — K208 Other esophagitis: Secondary | ICD-10-CM | POA: Diagnosis not present

## 2016-09-24 DIAGNOSIS — Z79899 Other long term (current) drug therapy: Secondary | ICD-10-CM | POA: Diagnosis not present

## 2016-09-24 DIAGNOSIS — K2930 Chronic superficial gastritis without bleeding: Secondary | ICD-10-CM | POA: Diagnosis not present

## 2016-09-24 DIAGNOSIS — Z8371 Family history of colonic polyps: Secondary | ICD-10-CM | POA: Diagnosis not present

## 2016-09-24 DIAGNOSIS — K449 Diaphragmatic hernia without obstruction or gangrene: Secondary | ICD-10-CM | POA: Diagnosis not present

## 2016-09-24 DIAGNOSIS — K648 Other hemorrhoids: Secondary | ICD-10-CM | POA: Diagnosis not present

## 2016-09-24 DIAGNOSIS — Z800 Family history of malignant neoplasm of digestive organs: Secondary | ICD-10-CM | POA: Diagnosis not present

## 2016-09-24 DIAGNOSIS — K5730 Diverticulosis of large intestine without perforation or abscess without bleeding: Secondary | ICD-10-CM | POA: Diagnosis not present

## 2016-09-24 DIAGNOSIS — K317 Polyp of stomach and duodenum: Secondary | ICD-10-CM | POA: Diagnosis not present

## 2016-09-24 DIAGNOSIS — K2950 Unspecified chronic gastritis without bleeding: Secondary | ICD-10-CM | POA: Diagnosis not present

## 2016-09-24 DIAGNOSIS — K3189 Other diseases of stomach and duodenum: Secondary | ICD-10-CM | POA: Diagnosis not present

## 2016-09-24 DIAGNOSIS — K2270 Barrett's esophagus without dysplasia: Secondary | ICD-10-CM | POA: Diagnosis not present

## 2016-09-24 DIAGNOSIS — Z86010 Personal history of colonic polyps: Secondary | ICD-10-CM | POA: Diagnosis not present

## 2016-09-24 DIAGNOSIS — R7303 Prediabetes: Secondary | ICD-10-CM | POA: Diagnosis not present

## 2016-09-24 DIAGNOSIS — K52831 Collagenous colitis: Secondary | ICD-10-CM | POA: Diagnosis not present

## 2016-09-24 DIAGNOSIS — Z6841 Body Mass Index (BMI) 40.0 and over, adult: Secondary | ICD-10-CM | POA: Diagnosis not present

## 2016-09-24 DIAGNOSIS — R197 Diarrhea, unspecified: Secondary | ICD-10-CM | POA: Diagnosis not present

## 2016-09-24 DIAGNOSIS — K210 Gastro-esophageal reflux disease with esophagitis: Secondary | ICD-10-CM | POA: Diagnosis not present

## 2016-09-25 ENCOUNTER — Other Ambulatory Visit: Payer: Self-pay | Admitting: Physician Assistant

## 2016-09-25 ENCOUNTER — Telehealth: Payer: Self-pay | Admitting: Physician Assistant

## 2016-09-25 DIAGNOSIS — G44229 Chronic tension-type headache, not intractable: Secondary | ICD-10-CM

## 2016-09-25 NOTE — Telephone Encounter (Signed)
LM for pt to call back to schedule NP appt with Everardo AllEllison

## 2016-09-25 NOTE — Telephone Encounter (Signed)
Pt advised of status update 

## 2016-09-25 NOTE — Telephone Encounter (Signed)
-----   Message from [facility] Cumm[facility], [place] sent at 09/24/2016  5:00 PM EDT ----- Let patient know that I did call endocrinology to request an earlier appointment on her behalf The desk stated that 1 month is the typical wait time, but that they would send a message to the providers to see if they would like to see her sooner

## 2016-09-28 DIAGNOSIS — R0602 Shortness of breath: Secondary | ICD-10-CM | POA: Diagnosis not present

## 2016-09-28 DIAGNOSIS — M5136 Other intervertebral disc degeneration, lumbar region: Secondary | ICD-10-CM | POA: Diagnosis not present

## 2016-09-28 DIAGNOSIS — G4710 Hypersomnia, unspecified: Secondary | ICD-10-CM | POA: Diagnosis not present

## 2016-09-28 DIAGNOSIS — L4050 Arthropathic psoriasis, unspecified: Secondary | ICD-10-CM | POA: Diagnosis not present

## 2016-09-30 DIAGNOSIS — K52831 Collagenous colitis: Secondary | ICD-10-CM | POA: Diagnosis not present

## 2016-09-30 DIAGNOSIS — R198 Other specified symptoms and signs involving the digestive system and abdomen: Secondary | ICD-10-CM | POA: Diagnosis not present

## 2016-10-05 ENCOUNTER — Other Ambulatory Visit: Payer: Self-pay | Admitting: Physician Assistant

## 2016-10-05 DIAGNOSIS — G44229 Chronic tension-type headache, not intractable: Secondary | ICD-10-CM

## 2016-10-06 ENCOUNTER — Ambulatory Visit: Payer: BLUE CROSS/BLUE SHIELD

## 2016-10-07 ENCOUNTER — Other Ambulatory Visit: Payer: Self-pay | Admitting: Physician Assistant

## 2016-10-07 ENCOUNTER — Telehealth: Payer: Self-pay | Admitting: *Deleted

## 2016-10-07 DIAGNOSIS — R600 Localized edema: Secondary | ICD-10-CM

## 2016-10-07 NOTE — Telephone Encounter (Signed)
Pre Authorization sent to cover my meds. [id number]  request has been approved  Your PA request has been approved. Additional information will be provided in the approval communication. (Message [id number])  Patient and pharm notified

## 2016-10-08 ENCOUNTER — Encounter: Payer: Self-pay | Admitting: Endocrinology

## 2016-10-08 ENCOUNTER — Ambulatory Visit (INDEPENDENT_AMBULATORY_CARE_PROVIDER_SITE_OTHER): Payer: BLUE CROSS/BLUE SHIELD | Admitting: Endocrinology

## 2016-10-08 VITALS — BP 122/80 | HR 89 | Ht 64.0 in | Wt 319.0 lb

## 2016-10-08 DIAGNOSIS — R197 Diarrhea, unspecified: Secondary | ICD-10-CM | POA: Diagnosis not present

## 2016-10-08 DIAGNOSIS — R12 Heartburn: Secondary | ICD-10-CM | POA: Diagnosis not present

## 2016-10-08 DIAGNOSIS — K52831 Collagenous colitis: Secondary | ICD-10-CM | POA: Diagnosis not present

## 2016-10-08 DIAGNOSIS — E210 Primary hyperparathyroidism: Secondary | ICD-10-CM | POA: Diagnosis not present

## 2016-10-08 DIAGNOSIS — K219 Gastro-esophageal reflux disease without esophagitis: Secondary | ICD-10-CM | POA: Diagnosis not present

## 2016-10-08 NOTE — Progress Notes (Signed)
Subjective:    Patient ID: Sarah Mayer, female    DOB: 03-28-1970, 46 y.o.   MRN: [id number]  HPI Pt is referred by Randalyn Rhea, PA, for hypercalcemia.  Pt was noted to have mild hypercalcemia in 2018 (it was normal in 2017). she has never had osteoporosis, thyroid probs, sarcoidosis, cancer, PUD, or pancreatitis.  Only bony fracture was right ankle many years ago.  He does not take vitamin-D or A supplements.  Pt denies taking antacids, Li++, or HCTZ.  She has h/o urolithiasis many years ago (uncertain cause).   Past Medical History:  Diagnosis Date  . Chronic pain syndrome 05/25/2016  . Gastroesophageal reflux disease 03/24/2016  . History of DVT (deep vein thrombosis) 03/27/2016  . History of pulmonary embolism 03/27/2016  . IBS (irritable bowel syndrome)   . Microscopic colitis   . Migraine   . Morbid obesity (HCC) 05/27/2016  . Psoriatic arthritis [facility])     Past Surgical History:  Procedure Laterality Date  . COLONOSCOPY W/ BIOPSIES    . ESOPHAGOGASTRODUODENOSCOPY      Social History   Social History  . Marital status: Significant Other    Spouse name: N/A  . Number of children: N/A  . Years of education: N/A   Occupational History  . Not on file.   Social History Main Topics  . Smoking status: Never Smoker  . Smokeless tobacco: Never Used  . Alcohol use No  . Drug use: No  . Sexual activity: Not Currently   Other Topics Concern  . Not on file   Social History Narrative  . No narrative on file    Current Outpatient Prescriptions on File Prior to Visit  Medication Sig Dispense Refill  . Adalimumab 40 MG/0.8ML PNKT Inject 40 mg into the skin.    [name] AMBULATORY NON FORMULARY MEDICATION Ultra Fine  6mm pen needles to be used with Saxenda 100 each 1  . butalbital-acetaminophen-caffeine (FIORICET WITH CODEINE) 50-325-40-30 MG capsule TAKE ONE CAPSULE BY MOUTH EVERY 4 HOURS AS NEEDED FOR HEADACHE 30 capsule 0  . cyclobenzaprine (FLEXERIL) 10 MG tablet Take 5-10 mg by  mouth.    . diphenoxylate-atropine (LOMOTIL) 2.5-0.025 MG tablet One to 2 tablets by mouth 4 times a day as needed for diarrhea. 30 tablet 3  . DULoxetine (CYMBALTA) 30 MG capsule Take 1 capsule (30 mg total) by mouth daily. Total 90mg  daily. 90 capsule 0  . DULoxetine (CYMBALTA) 60 MG capsule Take 1 capsule (60 mg total) by mouth daily. Total 90mg  daily. 90 capsule 0  . fexofenadine (ALLEGRA) 180 MG tablet Take 180 mg by mouth.    . furosemide (LASIX) 20 MG tablet TAKE 1 TABLET (20 MG TOTAL) BY MOUTH DAILY. 30 tablet 2  . gabapentin (NEURONTIN) 300 MG capsule Take 600 mg by mouth.    [name] HYDROcodone-acetaminophen (NORCO/VICODIN) 5-325 MG tablet Take by mouth.    [name] SAXENDA 18 MG/3ML SOPN INJECT 0.6 MG INTO THE SKIN FOR 1 WEEK, THEN INCREASE BY 0.6 MG WEEKLY UNTIL REACHES 3MG  15 mL 1  . zolpidem (AMBIEN) 5 MG tablet TAKE 1 TABLET BY MOUTH AT BEDTIME AS NEEDED 30 tablet 2  . Cyanocobalamin (B-12) 1000 MCG CAPS Take 1 tablet by mouth daily.    . ferrous sulfate 325 (65 FE) MG tablet Take 1 tablet (325 mg total) by mouth daily with breakfast. (Patient not taking: Reported on 10/08/2016) 30 tablet 11  . Lactobacillus-Inulin (CULTURELLE DIGESTIVE HEALTH) CAPS Take 1 capsule by mouth daily. (Patient not  taking: Reported on 10/08/2016) 30 capsule 11  . NUCYNTA ER 100 MG 12 hr tablet     . omeprazole (PRILOSEC) 20 MG capsule Take 20 mg by mouth.     No current facility-administered medications on file prior to visit.     No Known Allergies  No family history on file.  BP 122/80   Pulse 89   Ht 5\' 4"  (1.626 m)   Wt (!) 319 lb (144.7 kg)   SpO2 95%   BMI 54.76 kg/m    Review of Systems Denies weight loss, visual loss, cough, edema, diarrhea, sore throat, palpitations, hematuria, or numbness; she has chronic back pain, dry skin, frequent urination, intermitt diarrhea, and depression. She has irreg menses.      Objective:   Physical Exam VS: see vs page GEN: no distress HEAD: head: no  deformity eyes: no periorbital swelling, no proptosis external nose and ears are normal mouth: no lesion seen NECK: supple, thyroid is not enlarged CHEST WALL: no deformity LUNGS: clear to auscultation CV: reg rate and rhythm, no murmur ABD: abdomen is soft, nontender.  no hepatosplenomegaly.  not distended.  no hernia MUSCULOSKELETAL: muscle bulk and strength are grossly normal.  no obvious joint swelling.  gait is steady with a cane EXTEMITIES: no deformity.  no ulcer on the feet.  feet are of normal color and temp.  no edema PULSES: dorsalis pedis intact bilat.  no carotid bruit NEURO:  cn 2-12 grossly intact.   readily moves all 4's.  sensation is intact to touch on the feet SKIN:  Normal texture and temperature.  No rash or suspicious lesion is visible.   NODES:  None palpable at the neck PSYCH: alert, well-oriented.  Does not appear anxious nor depressed.    Lab Results  Component Value Date   PTH 125 (H) 09/17/2016   CALCIUM 9.7 09/17/2016   CAION 6.0 (H) 09/09/2016   I have reviewed outside records, and summarized:  Pt was noted to have elevated Ca++, and referred here.  Main problem addressed was diarrhea.  Cause was colitis of uncertain etiology.      Assessment & Plan:  Hyperparathyroidism, probably primary  Patient Instructions  blood tests are requested for you today.  We'll let you know about the results. Let's also check the bone density test.  you will receive a phone call, about a day and time for an appointment.

## 2016-10-08 NOTE — Patient Instructions (Signed)
blood tests are requested for you today.  We'll let you know about the results. Let's also check the bone density test.  you will receive a phone call, about a day and time for an appointment.

## 2016-10-09 ENCOUNTER — Other Ambulatory Visit: Payer: Self-pay | Admitting: Physician Assistant

## 2016-10-09 LAB — VITAMIN D 25 HYDROXY (VIT D DEFICIENCY, FRACTURES): VITD: 25.26 ng/mL — AB (ref 30.00–100.00)

## 2016-10-12 ENCOUNTER — Encounter: Payer: Self-pay | Admitting: Physician Assistant

## 2016-10-12 ENCOUNTER — Ambulatory Visit (INDEPENDENT_AMBULATORY_CARE_PROVIDER_SITE_OTHER): Payer: BLUE CROSS/BLUE SHIELD | Admitting: Physician Assistant

## 2016-10-12 VITALS — BP 135/85 | HR 84 | Ht 64.0 in | Wt 320.0 lb

## 2016-10-12 DIAGNOSIS — R21 Rash and other nonspecific skin eruption: Secondary | ICD-10-CM | POA: Diagnosis not present

## 2016-10-12 DIAGNOSIS — K2270 Barrett's esophagus without dysplasia: Secondary | ICD-10-CM | POA: Diagnosis not present

## 2016-10-12 DIAGNOSIS — E210 Primary hyperparathyroidism: Secondary | ICD-10-CM

## 2016-10-12 DIAGNOSIS — L4050 Arthropathic psoriasis, unspecified: Secondary | ICD-10-CM | POA: Diagnosis not present

## 2016-10-12 DIAGNOSIS — K52831 Collagenous colitis: Secondary | ICD-10-CM | POA: Diagnosis not present

## 2016-10-12 DIAGNOSIS — M2550 Pain in unspecified joint: Secondary | ICD-10-CM

## 2016-10-12 DIAGNOSIS — F5101 Primary insomnia: Secondary | ICD-10-CM

## 2016-10-12 DIAGNOSIS — G894 Chronic pain syndrome: Secondary | ICD-10-CM | POA: Diagnosis not present

## 2016-10-12 LAB — PTH, INTACT AND CALCIUM
Calcium: 10.2 mg/dL (ref 8.6–10.2)
PTH: 102 pg/mL — AB (ref 14–64)

## 2016-10-12 MED ORDER — DESVENLAFAXINE SUCCINATE ER 100 MG PO TB24: 100.0000 mg | ORAL_TABLET | Freq: Every day | ORAL | 1 refills | Status: DC

## 2016-10-12 MED ORDER — ZOLPIDEM TARTRATE 5 MG PO TABS: 5.0000 mg | ORAL_TABLET | Freq: Every evening | ORAL | 5 refills | Status: DC | PRN

## 2016-10-12 MED ORDER — RABEPRAZOLE SODIUM 20 MG PO TBEC: 20.0000 mg | DELAYED_RELEASE_TABLET | Freq: Every day | ORAL | 11 refills | Status: DC

## 2016-10-12 MED ORDER — LIRAGLUTIDE -WEIGHT MANAGEMENT 18 MG/3ML ~~LOC~~ SOPN: 3.0000 mg | PEN_INJECTOR | Freq: Every day | SUBCUTANEOUS | 1 refills | Status: DC

## 2016-10-12 NOTE — Patient Instructions (Signed)
Take 30mg  of cymbalta and 1/2 tablet of pristiq for 10 days then stop cymbalta and increase to 1 tablet of pristiq.

## 2016-10-12 NOTE — Progress Notes (Signed)
Subjective:    Patient ID: Sarah Mayer, female    DOB: 29-Oct-1970, 46 y.o.   MRN: [id number]  HPI  Pt is a 46 yo morbidly obese female who presents to the clinic for follow up.   She is being seen by endocrinology for hyperparathyroidism found due to elevated calcium.   She has ongoing barrett's esophagus and collenagous colitis that was recently dx by GI. aciphex and zantac are helping GERD symptoms. Pt is on cholestryamine and entocort for colitis. Colonoscopy on august 2nd.    she has lost from 346 to 320. 26lbs since April on saxenda. She does feel like it is helping her. She would like refill. She is watching her calories but she is not exercising.   She sees rheumatology for chronic pain, psoratric arthritis, arthragia. She feels like tretment is not helping her pain. She would like 2nd opinion. I started her on cymbalta for pain and she states it is not doing anything. She would like to try something else.   Pt has a new rash on bilateral feet. Not itching, burning etc. Started after colonoscopy.  .. Active Ambulatory Problems    Diagnosis Date Noted  . Psoriatic arthritis (HCC) 03/24/2016  . Gastroesophageal reflux disease 03/24/2016  . GAD (generalized anxiety disorder) 03/24/2016  . Iron deficiency anemia 03/24/2016  . History of pulmonary embolism 03/27/2016  . History of DVT (deep vein thrombosis) 03/27/2016  . Chronic tension-type headache, not intractable 03/27/2016  . Primary insomnia 03/27/2016  . Bilateral lower extremity edema 03/27/2016  . Chronic pain syndrome 05/25/2016  . Morbid obesity (HCC) 05/27/2016  . Serum calcium elevated 08/30/2016  . Diarrhea 08/30/2016  . Hyperparathyroidism, primary (HCC) 09/18/2016  . Collagenous colitis 10/18/2016  . Barrett's esophagus without dysplasia 10/18/2016  . Arthralgia 10/18/2016  . Rash and nonspecific skin eruption 10/18/2016   Resolved Ambulatory Problems    Diagnosis Date Noted  . No Resolved Ambulatory  Problems   Past Medical History:  Diagnosis Date  . Chronic pain syndrome 05/25/2016  . Gastroesophageal reflux disease 03/24/2016  . History of DVT (deep vein thrombosis) 03/27/2016  . History of pulmonary embolism 03/27/2016  . IBS (irritable bowel syndrome)   . Microscopic colitis   . Migraine   . Morbid obesity (HCC) 05/27/2016  . Psoriatic arthritis (HCC)      Review of Systems    see HPI.  Objective:   Physical Exam  Constitutional: She is oriented to person, place, and time. She appears well-developed and well-nourished.  Morbid obesity.   HENT:  Head: Normocephalic and atraumatic.  Cardiovascular: Normal rate, regular rhythm and normal heart sounds.   Pulmonary/Chest: Effort normal and breath sounds normal.  Neurological: She is alert and oriented to person, place, and time.  Skin:  Pinpoint macular red dots with no scales, warmth, tendernss on bilateral feel. Almost appear like petechia.   Psychiatric: She has a normal mood and affect. Her behavior is normal.          Assessment & Plan:   Marland KitchenMarland KitchenJakera was seen today for follow-up.  Diagnoses and all orders for this visit:  Psoriatic arthritis (HCC) -     Ambulatory referral to Rheumatology  Primary insomnia -     zolpidem (AMBIEN) 5 MG tablet; Take 1 tablet (5 mg total) by mouth at bedtime as needed.  Primary hyperparathyroidism (HCC)  Barrett's esophagus without dysplasia -     RABEprazole (ACIPHEX) 20 MG tablet; Take 1 tablet (20 mg total) by mouth daily.  Collagenous colitis  Morbid obesity (HCC) -     Liraglutide -Weight Management (SAXENDA) 18 MG/3ML SOPN; Inject 3 mg into the skin daily.  Chronic pain syndrome -     desvenlafaxine (PRISTIQ) 100 MG 24 hr tablet; Take 1 tablet (100 mg total) by mouth daily. -     Ambulatory referral to Rheumatology  Arthralgia, unspecified joint -     Ambulatory referral to Rheumatology  Rash and nonspecific skin eruption        Pt is not happen with  Rheumatologist and would like 2nd opinion placed referral. Not clear if psoriatic arthritis is firm dx but appears patient has been treated for this by rheumatology.   Taper off cymbalta and start pristiq. Follow up in 4-6 weeks.   .Discussed low carb diet with 1500 calories and 80g of protein.  Exercising at least 150 minutes a week.  [employer] could be a Chief Technology Officer.  Continue with saxenda. Next milestone would be 300lbs.   Rash unclear dx. Suggested follow up with dermatologist.

## 2016-10-13 ENCOUNTER — Telehealth: Payer: Self-pay | Admitting: Endocrinology

## 2016-10-13 DIAGNOSIS — R21 Rash and other nonspecific skin eruption: Secondary | ICD-10-CM | POA: Diagnosis not present

## 2016-10-13 DIAGNOSIS — T07XXXA Unspecified multiple injuries, initial encounter: Secondary | ICD-10-CM | POA: Diagnosis not present

## 2016-10-13 DIAGNOSIS — L299 Pruritus, unspecified: Secondary | ICD-10-CM | POA: Diagnosis not present

## 2016-10-13 NOTE — Telephone Encounter (Signed)
Yes, it was ordered.  Please refer call to [facility].

## 2016-10-13 NOTE — Telephone Encounter (Signed)
Patient says she was supposed to be referred for bone density test. She has not heard anything.  Ty,  -LL

## 2016-10-13 NOTE — Telephone Encounter (Signed)
I see the order was placed, should I check with [facility]?? Thanks!

## 2016-10-15 DIAGNOSIS — G4733 Obstructive sleep apnea (adult) (pediatric): Secondary | ICD-10-CM | POA: Diagnosis not present

## 2016-10-15 DIAGNOSIS — R0902 Hypoxemia: Secondary | ICD-10-CM | POA: Diagnosis not present

## 2016-10-15 NOTE — Telephone Encounter (Signed)
Called and made patient appt. At [facility] [place] for 9/6 @ 8:30am

## 2016-10-15 NOTE — Telephone Encounter (Signed)
I do not schedule bone destiny it should go to [facility]

## 2016-10-15 NOTE — Telephone Encounter (Signed)
Called and notified patient of appt. Day & time.

## 2016-10-15 NOTE — Telephone Encounter (Signed)
Thank you! I knew that!! I am sorry, I misread. Have a great day!

## 2016-10-18 DIAGNOSIS — M2550 Pain in unspecified joint: Secondary | ICD-10-CM | POA: Insufficient documentation

## 2016-10-18 DIAGNOSIS — R21 Rash and other nonspecific skin eruption: Secondary | ICD-10-CM | POA: Insufficient documentation

## 2016-10-18 DIAGNOSIS — K2270 Barrett's esophagus without dysplasia: Secondary | ICD-10-CM | POA: Insufficient documentation

## 2016-10-18 DIAGNOSIS — K52831 Collagenous colitis: Secondary | ICD-10-CM | POA: Insufficient documentation

## 2016-10-22 ENCOUNTER — Ambulatory Visit: Payer: BLUE CROSS/BLUE SHIELD

## 2016-10-22 DIAGNOSIS — Z789 Other specified health status: Secondary | ICD-10-CM

## 2016-10-22 DIAGNOSIS — K429 Umbilical hernia without obstruction or gangrene: Secondary | ICD-10-CM

## 2016-10-22 DIAGNOSIS — L578 Other skin changes due to chronic exposure to nonionizing radiation: Secondary | ICD-10-CM

## 2016-10-22 DIAGNOSIS — Z0000 Encounter for general adult medical examination without abnormal findings: Secondary | ICD-10-CM

## 2016-10-22 DIAGNOSIS — K219 Gastro-esophageal reflux disease without esophagitis: Secondary | ICD-10-CM | POA: Diagnosis not present

## 2016-10-22 DIAGNOSIS — R198 Other specified symptoms and signs involving the digestive system and abdomen: Secondary | ICD-10-CM | POA: Diagnosis not present

## 2016-10-28 ENCOUNTER — Ambulatory Visit: Payer: BLUE CROSS/BLUE SHIELD

## 2016-10-28 DIAGNOSIS — L4050 Arthropathic psoriasis, unspecified: Secondary | ICD-10-CM | POA: Diagnosis not present

## 2016-10-28 DIAGNOSIS — M79671 Pain in right foot: Secondary | ICD-10-CM | POA: Diagnosis not present

## 2016-10-28 DIAGNOSIS — M533 Sacrococcygeal disorders, not elsewhere classified: Secondary | ICD-10-CM | POA: Diagnosis not present

## 2016-10-28 DIAGNOSIS — G894 Chronic pain syndrome: Secondary | ICD-10-CM | POA: Diagnosis not present

## 2016-10-28 DIAGNOSIS — M79642 Pain in left hand: Secondary | ICD-10-CM | POA: Diagnosis not present

## 2016-10-28 DIAGNOSIS — M79641 Pain in right hand: Secondary | ICD-10-CM | POA: Diagnosis not present

## 2016-10-28 DIAGNOSIS — M79672 Pain in left foot: Secondary | ICD-10-CM | POA: Diagnosis not present

## 2016-10-28 DIAGNOSIS — Z79899 Other long term (current) drug therapy: Secondary | ICD-10-CM | POA: Diagnosis not present

## 2016-10-29 ENCOUNTER — Ambulatory Visit
Admission: RE | Admit: 2016-10-29 | Discharge: 2016-10-29 | Disposition: A | Discharge status: Home / Self Care | Payer: BLUE CROSS/BLUE SHIELD | Source: Ambulatory Visit | Attending: Endocrinology | Admitting: Endocrinology

## 2016-10-29 DIAGNOSIS — E669 Obesity, unspecified: Secondary | ICD-10-CM | POA: Diagnosis not present

## 2016-10-29 DIAGNOSIS — Z780 Asymptomatic menopausal state: Secondary | ICD-10-CM | POA: Diagnosis not present

## 2016-10-29 DIAGNOSIS — G4733 Obstructive sleep apnea (adult) (pediatric): Secondary | ICD-10-CM | POA: Diagnosis not present

## 2016-10-29 DIAGNOSIS — M8588 Other specified disorders of bone density and structure, other site: Secondary | ICD-10-CM | POA: Diagnosis not present

## 2016-10-29 DIAGNOSIS — K589 Irritable bowel syndrome without diarrhea: Secondary | ICD-10-CM | POA: Diagnosis not present

## 2016-10-29 DIAGNOSIS — Z79899 Other long term (current) drug therapy: Secondary | ICD-10-CM | POA: Diagnosis not present

## 2016-10-29 DIAGNOSIS — E210 Primary hyperparathyroidism: Secondary | ICD-10-CM

## 2016-11-02 ENCOUNTER — Inpatient Hospital Stay: Payer: BLUE CROSS/BLUE SHIELD

## 2016-11-02 ENCOUNTER — Other Ambulatory Visit: Payer: Self-pay | Admitting: Physician Assistant

## 2016-11-02 DIAGNOSIS — K429 Umbilical hernia without obstruction or gangrene: Secondary | ICD-10-CM

## 2016-11-02 DIAGNOSIS — Z79899 Other long term (current) drug therapy: Secondary | ICD-10-CM | POA: Diagnosis not present

## 2016-11-02 DIAGNOSIS — F5101 Primary insomnia: Secondary | ICD-10-CM

## 2016-11-02 DIAGNOSIS — L4050 Arthropathic psoriasis, unspecified: Secondary | ICD-10-CM | POA: Diagnosis not present

## 2016-11-02 DIAGNOSIS — M5136 Other intervertebral disc degeneration, lumbar region: Secondary | ICD-10-CM | POA: Diagnosis not present

## 2016-11-02 DIAGNOSIS — M1710 Unilateral primary osteoarthritis, unspecified knee: Secondary | ICD-10-CM | POA: Diagnosis not present

## 2016-11-03 ENCOUNTER — Ambulatory Visit: Payer: BLUE CROSS/BLUE SHIELD

## 2016-11-03 DIAGNOSIS — R21 Rash and other nonspecific skin eruption: Secondary | ICD-10-CM | POA: Diagnosis not present

## 2016-11-03 DIAGNOSIS — T07XXXA Unspecified multiple injuries, initial encounter: Secondary | ICD-10-CM | POA: Diagnosis not present

## 2016-11-03 DIAGNOSIS — L400 Psoriasis vulgaris: Secondary | ICD-10-CM | POA: Diagnosis not present

## 2016-11-04 ENCOUNTER — Ambulatory Visit: Payer: BLUE CROSS/BLUE SHIELD

## 2016-11-04 ENCOUNTER — Telehealth: Payer: BLUE CROSS/BLUE SHIELD

## 2016-11-10 ENCOUNTER — Other Ambulatory Visit: Payer: Self-pay | Admitting: Physician Assistant

## 2016-11-10 DIAGNOSIS — F5101 Primary insomnia: Secondary | ICD-10-CM

## 2016-11-10 DIAGNOSIS — G44229 Chronic tension-type headache, not intractable: Secondary | ICD-10-CM

## 2016-11-21 ENCOUNTER — Other Ambulatory Visit: Payer: Self-pay | Admitting: Physician Assistant

## 2016-11-21 DIAGNOSIS — G894 Chronic pain syndrome: Secondary | ICD-10-CM

## 2016-11-23 ENCOUNTER — Encounter: Payer: Self-pay | Admitting: Physician Assistant

## 2016-11-23 ENCOUNTER — Ambulatory Visit (INDEPENDENT_AMBULATORY_CARE_PROVIDER_SITE_OTHER): Payer: BLUE CROSS/BLUE SHIELD | Admitting: Physician Assistant

## 2016-11-23 VITALS — BP 128/61 | HR 76 | Ht 64.0 in | Wt 333.0 lb

## 2016-11-23 DIAGNOSIS — R413 Other amnesia: Secondary | ICD-10-CM | POA: Diagnosis not present

## 2016-11-23 DIAGNOSIS — G4733 Obstructive sleep apnea (adult) (pediatric): Secondary | ICD-10-CM | POA: Diagnosis not present

## 2016-11-23 DIAGNOSIS — Z23 Encounter for immunization: Secondary | ICD-10-CM | POA: Diagnosis not present

## 2016-11-23 DIAGNOSIS — F339 Major depressive disorder, recurrent, unspecified: Secondary | ICD-10-CM

## 2016-11-23 DIAGNOSIS — G894 Chronic pain syndrome: Secondary | ICD-10-CM | POA: Diagnosis not present

## 2016-11-23 DIAGNOSIS — F329 Major depressive disorder, single episode, unspecified: Secondary | ICD-10-CM | POA: Insufficient documentation

## 2016-11-23 MED ORDER — DESVENLAFAXINE SUCCINATE ER 100 MG PO TB24: 100.0000 mg | ORAL_TABLET | Freq: Every day | ORAL | 1 refills | Status: DC

## 2016-11-23 MED ORDER — VORTIOXETINE HBR 10 MG PO TABS: 1.0000 | ORAL_TABLET | Freq: Every day | ORAL | 1 refills | Status: DC

## 2016-11-23 MED ORDER — LIRAGLUTIDE -WEIGHT MANAGEMENT 18 MG/3ML ~~LOC~~ SOPN: 3.0000 mg | PEN_INJECTOR | Freq: Every day | SUBCUTANEOUS | 2 refills | Status: DC

## 2016-11-23 NOTE — Progress Notes (Addendum)
Subjective:    Patient ID: Sarah Mayer, female    DOB: Apr 05, 1970, 46 y.o.   MRN: [id number]  HPI  Pt is a 46 yo female with multiple chronic conditions that presents to the clinic to follow up on pristiq.   She feels no change in mood or pain with pristiq. At last visit she had lost weight down to 319 but since she has gained back up to 333. She remains on saxenda. She does admit her appetite has increased. The only other medication change is humira to sterlera from her new rheumatologist Dr. Janace Aris. She is not exercising due to pain. She recently was dx with gastroparesis. Gabapentin was increased in march.   She continues to see endocrinologist Dr. Everardo All. He found DEXA to be mildly positive for osteoporosis. He did not recommend medication. He will follow up in 6 months.   For the last 3 months she has had some memory loss. She just doesn't feel as sharp as she used too.   .. Active Ambulatory Problems    Diagnosis Date Noted  . Psoriatic arthritis (HCC) 03/24/2016  . Gastroesophageal reflux disease 03/24/2016  . GAD (generalized anxiety disorder) 03/24/2016  . Iron deficiency anemia 03/24/2016  . History of pulmonary embolism 03/27/2016  . History of DVT (deep vein thrombosis) 03/27/2016  . Chronic tension-type headache, not intractable 03/27/2016  . Primary insomnia 03/27/2016  . Bilateral lower extremity edema 03/27/2016  . Chronic pain syndrome 05/25/2016  . Morbid obesity (HCC) 05/27/2016  . Serum calcium elevated 08/30/2016  . Diarrhea 08/30/2016  . Hyperparathyroidism, primary (HCC) 09/18/2016  . Collagenous colitis 10/18/2016  . Barrett's esophagus without dysplasia 10/18/2016  . Arthralgia 10/18/2016  . Rash and nonspecific skin eruption 10/18/2016  . Memory changes 11/23/2016  . Major depression, recurrent, chronic (HCC) 11/23/2016  . OSA (obstructive sleep apnea) 11/23/2016   Resolved Ambulatory Problems    Diagnosis Date Noted  . No Resolved Ambulatory  Problems   Past Medical History:  Diagnosis Date  . Chronic pain syndrome 05/25/2016  . Gastroesophageal reflux disease 03/24/2016  . History of DVT (deep vein thrombosis) 03/27/2016  . History of pulmonary embolism 03/27/2016  . IBS (irritable bowel syndrome)   . Microscopic colitis   . Migraine   . Morbid obesity (HCC) 05/27/2016  . Psoriatic arthritis (HCC)      Review of Systems     Objective:   Physical Exam  Constitutional: She is oriented to person, place, and time. She appears well-developed and well-nourished.  Morbidly obese.   HENT:  Head: Normocephalic and atraumatic.  Cardiovascular: Normal rate, regular rhythm and normal heart sounds.   Pulmonary/Chest: Effort normal and breath sounds normal.  Neurological: She is alert and oriented to person, place, and time.  Psychiatric: She has a normal mood and affect. Her behavior is normal.          Assessment & Plan:  Marland KitchenMarland KitchenDiagnoses and all orders for this visit:  Chronic pain syndrome -     Discontinue: desvenlafaxine (PRISTIQ) 100 MG 24 hr tablet; Take 1 tablet (100 mg total) by mouth daily. -     Amb Referral to Bariatric Surgery  Influenza vaccine needed -     Flu Vaccine QUAD 6+ mos PF IM (Fluarix Quad PF)  Morbid obesity (HCC) -     Liraglutide -Weight Management (SAXENDA) 18 MG/3ML SOPN; Inject 3 mg into the skin daily. -     Amb Referral to Bariatric Surgery  Memory changes  Major depression, recurrent,  chronic (HCC)  OSA (obstructive sleep apnea)  Other orders -     vortioxetine HBr (TRINTELLIX) 10 MG TABS; Take 1 tablet (10 mg total) by mouth daily.     .. Depression screen [facility] 2/9 11/23/2016 05/25/2016  Decreased Interest 1 1  Down, Depressed, Hopeless 1 3  PHQ - 2 Score 2 4  Altered sleeping 3 2  Tired, decreased energy 3 3  Change in appetite 1 2  Feeling bad or failure about yourself  1 0  Trouble concentrating 3 2  Moving slowly or fidgety/restless 3 3  Suicidal thoughts 0 0  PHQ-9 Score  16 16   ... MMSE - Mini Mental State Exam 11/23/2016  Orientation to time 5  Orientation to Place 5  Registration 3  Attention/ Calculation 5  Recall 3  Language- name 2 objects 2  Language- repeat 1  Language- follow 3 step command 3  Language- read & follow direction 1  Write a sentence 1  Copy design 1  Total score 30    Mini mental status was perfect. Unclear etiology. Could be some of her medications. For mood/pain will switch from pristiq to trintellix. Half tablet for 2 weeks then go to full tablet of trintellix. This change could help with cognitive function as well.   Continue to follow up with rheumatology/neurology/endocrinology.   Pt still declines and treatment of OSA. She wants to lose weight.  Would like for patient to consider surgery options. Will make referral.   Refilled saxenda.  [name].Discussed low carb diet with 1500 calories and 80g of protein.  Exercising at least 150 minutes a week.  [employer] could be a Chief Technology Officer.   [name].Spent 30 minutes with patient and greater than 50 percent of visit spent counseling patient regarding treatment plan.

## 2016-11-25 DIAGNOSIS — Z79899 Other long term (current) drug therapy: Secondary | ICD-10-CM | POA: Diagnosis not present

## 2016-11-25 DIAGNOSIS — G894 Chronic pain syndrome: Secondary | ICD-10-CM | POA: Diagnosis not present

## 2016-11-25 DIAGNOSIS — M533 Sacrococcygeal disorders, not elsewhere classified: Secondary | ICD-10-CM | POA: Diagnosis not present

## 2016-11-28 ENCOUNTER — Encounter: Payer: Self-pay | Admitting: Physician Assistant

## 2016-11-28 NOTE — Addendum Note (Signed)
Addended by: Jomarie Longs on: 11/28/2016 11:26 PM   Modules accepted: Orders

## 2016-12-02 DIAGNOSIS — Z79899 Other long term (current) drug therapy: Secondary | ICD-10-CM | POA: Diagnosis not present

## 2016-12-02 DIAGNOSIS — M5136 Other intervertebral disc degeneration, lumbar region: Secondary | ICD-10-CM | POA: Diagnosis not present

## 2016-12-02 DIAGNOSIS — Z01419 Encounter for gynecological examination (general) (routine) without abnormal findings: Secondary | ICD-10-CM | POA: Diagnosis not present

## 2016-12-02 DIAGNOSIS — N938 Other specified abnormal uterine and vaginal bleeding: Secondary | ICD-10-CM | POA: Diagnosis not present

## 2016-12-02 DIAGNOSIS — L4050 Arthropathic psoriasis, unspecified: Secondary | ICD-10-CM | POA: Diagnosis not present

## 2016-12-02 DIAGNOSIS — Z124 Encounter for screening for malignant neoplasm of cervix: Secondary | ICD-10-CM | POA: Diagnosis not present

## 2016-12-02 DIAGNOSIS — M1710 Unilateral primary osteoarthritis, unspecified knee: Secondary | ICD-10-CM | POA: Diagnosis not present

## 2016-12-02 DIAGNOSIS — R232 Flushing: Secondary | ICD-10-CM | POA: Diagnosis not present

## 2016-12-03 LAB — HM PAP SMEAR: HM PAP: NEGATIVE

## 2016-12-16 DIAGNOSIS — N938 Other specified abnormal uterine and vaginal bleeding: Secondary | ICD-10-CM | POA: Diagnosis not present

## 2016-12-16 DIAGNOSIS — N926 Irregular menstruation, unspecified: Secondary | ICD-10-CM | POA: Diagnosis not present

## 2016-12-16 DIAGNOSIS — R6882 Decreased libido: Secondary | ICD-10-CM | POA: Diagnosis not present

## 2016-12-16 DIAGNOSIS — R232 Flushing: Secondary | ICD-10-CM | POA: Diagnosis not present

## 2016-12-16 DIAGNOSIS — E669 Obesity, unspecified: Secondary | ICD-10-CM | POA: Diagnosis not present

## 2016-12-20 ENCOUNTER — Other Ambulatory Visit: Payer: Self-pay | Admitting: Physician Assistant

## 2016-12-22 ENCOUNTER — Ambulatory Visit (INDEPENDENT_AMBULATORY_CARE_PROVIDER_SITE_OTHER): Payer: BLUE CROSS/BLUE SHIELD | Admitting: Physician Assistant

## 2016-12-22 ENCOUNTER — Encounter: Payer: Self-pay | Admitting: Physician Assistant

## 2016-12-22 VITALS — BP 101/80 | HR 89 | Ht 64.0 in | Wt 344.0 lb

## 2016-12-22 DIAGNOSIS — J0100 Acute maxillary sinusitis, unspecified: Secondary | ICD-10-CM | POA: Diagnosis not present

## 2016-12-22 DIAGNOSIS — M25552 Pain in left hip: Secondary | ICD-10-CM | POA: Diagnosis not present

## 2016-12-22 DIAGNOSIS — R059 Cough, unspecified: Secondary | ICD-10-CM

## 2016-12-22 DIAGNOSIS — M25551 Pain in right hip: Secondary | ICD-10-CM | POA: Insufficient documentation

## 2016-12-22 DIAGNOSIS — R05 Cough: Secondary | ICD-10-CM

## 2016-12-22 DIAGNOSIS — R253 Fasciculation: Secondary | ICD-10-CM | POA: Insufficient documentation

## 2016-12-22 DIAGNOSIS — G245 Blepharospasm: Secondary | ICD-10-CM

## 2016-12-22 DIAGNOSIS — R058 Other specified cough: Secondary | ICD-10-CM | POA: Insufficient documentation

## 2016-12-22 MED ORDER — DOXYCYCLINE HYCLATE 100 MG PO TABS: 100.0000 mg | ORAL_TABLET | Freq: Two times a day (BID) | ORAL | 0 refills | Status: DC

## 2016-12-22 MED ORDER — BENZONATATE 100 MG PO CAPS: 200.0000 mg | ORAL_CAPSULE | Freq: Three times a day (TID) | ORAL | 0 refills | Status: DC | PRN

## 2016-12-22 NOTE — Progress Notes (Signed)
Subjective:    Patient ID: Sarah Mayer, female    DOB: 1971/01/17, 46 y.o.   MRN: [id number]  HPI  Patient is a 46 year old morbidly obese pleasant female who presents to the clinic with sinus pressure, nasal drainage, headache, productive cough, ear popping, congestion.  She has been battling these symptoms for the last week and a half.  She has not been taking anything over-the-counter.  She states that over-the-counter medications do not seem to help her.  She denies any fever or chills.  She has felt some shortness of breath and chest tightness.  She mentions some right eye twitching since earlier this month.  She wonders if I had any suggestions on things that could help to stop.  She denies starting any new medications.  She has never had anything like this happen before.  She continues to seek instruction on chronic pain.  Her most concerning pain right now is her bilateral hip pain.  She does see pain management but they have had no solution to this pain.  She wonders if I know anything she could do.  She is unable to sleep at night because she cannot lay on her back she cannot lay on her belly and she cannot  She has been on Saxenda for weight loss.  At first she was losing weight and felt her appetite was controlled.  Now she is not feeling the same effects.  She has gained over 20 pounds since August of this year.  .. Active Ambulatory Problems    Diagnosis Date Noted  . Psoriatic arthritis (HCC) 03/24/2016  . Gastroesophageal reflux disease 03/24/2016  . GAD (generalized anxiety disorder) 03/24/2016  . Iron deficiency anemia 03/24/2016  . History of pulmonary embolism 03/27/2016  . History of DVT (deep vein thrombosis) 03/27/2016  . Chronic tension-type headache, not intractable 03/27/2016  . Primary insomnia 03/27/2016  . Bilateral lower extremity edema 03/27/2016  . Chronic pain syndrome 05/25/2016  . Morbid obesity (HCC) 05/27/2016  . Serum calcium elevated 08/30/2016   . Diarrhea 08/30/2016  . Hyperparathyroidism, primary (HCC) 09/18/2016  . Collagenous colitis 10/18/2016  . Barrett's esophagus without dysplasia 10/18/2016  . Arthralgia 10/18/2016  . Rash and nonspecific skin eruption 10/18/2016  . Memory changes 11/23/2016  . Major depression, recurrent, chronic (HCC) 11/23/2016  . OSA (obstructive sleep apnea) 11/23/2016  . Twitching 12/22/2016  . Cough 12/22/2016  . Bilateral hip pain 12/22/2016   Resolved Ambulatory Problems    Diagnosis Date Noted  . No Resolved Ambulatory Problems   Past Medical History:  Diagnosis Date  . Chronic pain syndrome 05/25/2016  . Gastroesophageal reflux disease 03/24/2016  . History of DVT (deep vein thrombosis) 03/27/2016  . History of pulmonary embolism 03/27/2016  . IBS (irritable bowel syndrome)   . Microscopic colitis   . Migraine   . Morbid obesity (HCC) 05/27/2016  . Psoriatic arthritis (HCC)       Review of Systems See HPI.     Objective:   Physical Exam  Constitutional: She appears well-developed and well-nourished.  Obese.  HENT:  Head: Normocephalic and atraumatic.  Right Ear: External ear normal.  Left Ear: External ear normal.  TM's clear.  Tenderness over maxillary sinuses.  ordoress breath.  Oropharynx erythematous without tonsil enlargment or exudate.  Nasal turbinates red and swollen.   Eyes: Conjunctivae are normal.  No notice eye twitching obeserved.   Neck: Normal range of motion. Neck supple.  Cardiovascular: Normal rate, regular rhythm and normal heart sounds.  Pulmonary/Chest: Effort normal and breath sounds normal. She has no wheezes.  Lymphadenopathy:    She has cervical adenopathy.  Psychiatric: She has a normal mood and affect. Her behavior is normal.          Assessment & Plan:  [name].[name].[name] was seen today for cough and headache.  Diagnoses and all orders for this visit:  Acute non-recurrent maxillary sinusitis -     doxycycline (VIBRA-TABS) 100 MG tablet; Take  1 tablet (100 mg total) by mouth 2 (two) times daily. For 10 days.  Cough -     doxycycline (VIBRA-TABS) 100 MG tablet; Take 1 tablet (100 mg total) by mouth 2 (two) times daily. For 10 days. -     benzonatate (TESSALON) 100 MG capsule; Take 2 capsules (200 mg total) by mouth 3 (three) times daily as needed for cough.  Blepharospasm  Bilateral hip pain  Morbid obesity (HCC)   Symptomatic care discussed regarding sinus infection. Call back if not improving.   Cough could lingure.   Discussed most common causes of eye twitching. HO given. Encouraged warm compresses.   Discussed tumeric/heat/tens unit for hip pain. Continue to follow up with pain clinic. Weight loss certainly could help.   Discussed obesity. Gave her number to call. Referral was made at last visit. Continue on saxenda for now but doubt ins will continue to pay with weight gain.   [name].Spent 30 minutes with patient and greater than 50 percent of visit spent counseling patient regarding treatment plan.

## 2016-12-22 NOTE — Patient Instructions (Addendum)
Blepharospasm Blepharospasm is a sudden tightening (spasm) of the muscles around your eyes (orbicularis oculi). It causes attacks of abnormal and uncontrollable blinking that come and go without warning. This type of abnormal muscle movement is called dystonia. Dystonia usually affects both eyes, but it does not affect other facial muscles or other parts of the body. Blepharospasm does not cause vision loss or lead to other serious physical problems. What are the causes? Blepharospasm is caused by an abnormality in a part of your brain that is called the basal ganglion. The exact cause of the abnormality is not known. Stress, fatigue, eye irritation, or bright light may trigger attacks of blepharospasm. What increases the risk? You may be at greater risk for blepharospasm if you:  Have a family history of blepharospasm.  Are female.  Are 5550-45 years old.  What are the signs or symptoms? Eye irritation and dryness are the first symptoms of blepharospasm. Your eyes may also feel tired or irritated when they are exposed to bright lights. As blepharospasm gets worse, uncontrollable blinking becomes more frequent. Other eye symptoms may include:  Uncontrolled and tight closing.  Watering.  Dryness.  Eye muscle pain.  Gritty sensation.  Sensitivity to light.  How is this diagnosed? Your health care provider may diagnose blepharospasm based on your symptoms and your medical history. Your health care provider may also do a physical exam to confirm the diagnosis. There are no blood tests or other tests to help diagnose blepharospasm. How is this treated? There is no cure for blepharospasm. Treatments that are used to manage the condition may include:  Botulinum toxin injections. Botulinum toxin is a substance that is produced by bacteria that cause muscle paralysis. Injecting this toxin into the muscles around the eye may control blepharospasm for up to three months. Injections are done with  a tiny needle. They can be repeated as needed.  Medicines. These include muscle relaxants, sedatives, and medicines that are called anticholinergics. Treatment with medicine is less successful than using injections.  Surgery. If other treatments have not worked, you may need surgery to remove part of the orbicularis oculi muscles (myectomy).  Follow these instructions at home: Several home care management strategies may help. You may have to try different techniques to find what works best for you. These may include:  Avoiding triggers that may bring on your attacks.  Resting and avoiding stress as much as possible.  Applying gentle pressure to the side of your eye or face. Sometimes this can stop an attack.  Doing a specific activity that can halt an attack. Examples include laughing, singing, yawning, and chewing.  Wearing dark glasses and a sun visor to protect your eyes from bright light.  Trying alternative treatments such as acupuncture, biofeedback, hypnosis, or meditation.  Going to sleep or taking a nap. Blepharospasm usually stops during sleep.  Contact a health care provider if:  Your attacks get worse or happen more often.  You are anxious or depressed because of your attacks. This information is not intended to replace advice given to you by your health care provider. Make sure you discuss any questions you have with your health care provider. Document Released: 02/12/2003 Document Revised: 07/18/2015 Document Reviewed: 10/03/2013 Elsevier Interactive Patient Education  2018 [employer]   Sinusitis, Adult Sinusitis is soreness and inflammation of your sinuses. Sinuses are hollow spaces in the bones around your face. Your sinuses are located:  Around your eyes.  In the middle of your forehead.  Behind your  nose.  In your cheekbones.  Your sinuses and nasal passages are lined with a stringy fluid (mucus). Mucus normally drains out of your sinuses. When your  nasal tissues become inflamed or swollen, the mucus can become trapped or blocked so air cannot flow through your sinuses. This allows bacteria, viruses, and funguses to grow, which leads to infection. Sinusitis can develop quickly and last for 7?10 days (acute) or for more than 12 weeks (chronic). Sinusitis often develops after a cold. What are the causes? This condition is caused by anything that creates swelling in the sinuses or stops mucus from draining, including:  Allergies.  Asthma.  Bacterial or viral infection.  Abnormally shaped bones between the nasal passages.  Nasal growths that contain mucus (nasal polyps).  Narrow sinus openings.  Pollutants, such as chemicals or irritants in the air.  A foreign object stuck in the nose.  A fungal infection. This is rare.  What increases the risk? The following factors may make you more likely to develop this condition:  Having allergies or asthma.  Having had a recent cold or respiratory tract infection.  Having structural deformities or blockages in your nose or sinuses.  Having a weak immune system.  Doing a lot of swimming or diving.  Overusing nasal sprays.  Smoking.  What are the signs or symptoms? The main symptoms of this condition are pain and a feeling of pressure around the affected sinuses. Other symptoms include:  Upper toothache.  Earache.  Headache.  Bad breath.  Decreased sense of smell and taste.  A cough that may get worse at night.  Fatigue.  Fever.  Thick drainage from your nose. The drainage is often green and it may contain pus (purulent).  Stuffy nose or congestion.  Postnasal drip. This is when extra mucus collects in the throat or back of the nose.  Swelling and warmth over the affected sinuses.  Sore throat.  Sensitivity to light.  How is this diagnosed? This condition is diagnosed based on symptoms, a medical history, and a physical exam. To find out if your condition  is acute or chronic, your health care provider may:  Look in your nose for signs of nasal polyps.  Tap over the affected sinus to check for signs of infection.  View the inside of your sinuses using an imaging device that has a light attached (endoscope).  If your health care provider suspects that you have chronic sinusitis, you may also:  Be tested for allergies.  Have a sample of mucus taken from your nose (nasal culture) and checked for bacteria.  Have a mucus sample examined to see if your sinusitis is related to an allergy.  If your sinusitis does not respond to treatment and it lasts longer than 8 weeks, you may have an MRI or CT scan to check your sinuses. These scans also help to determine how severe your infection is. In rare cases, a bone biopsy may be done to rule out more serious types of fungal sinus disease. How is this treated? Treatment for sinusitis depends on the cause and whether your condition is chronic or acute. If a virus is causing your sinusitis, your symptoms will go away on their own within 10 days. You may be given medicines to relieve your symptoms, including:  Topical nasal decongestants. They shrink swollen nasal passages and let mucus drain from your sinuses.  Antihistamines. These drugs block inflammation that is triggered by allergies. This can help to ease swelling in your nose  and sinuses.  Topical nasal corticosteroids. These are nasal sprays that ease inflammation and swelling in your nose and sinuses.  Nasal saline washes. These rinses can help to get rid of thick mucus in your nose.  If your condition is caused by bacteria, you will be given an antibiotic medicine. If your condition is caused by a fungus, you will be given an antifungal medicine. Surgery may be needed to correct underlying conditions, such as narrow nasal passages. Surgery may also be needed to remove polyps. Follow these instructions at home: Medicines  Take, use, or apply  over-the-counter and prescription medicines only as told by your health care provider. These may include nasal sprays.  If you were prescribed an antibiotic medicine, take it as told by your health care provider. Do not stop taking the antibiotic even if you start to feel better. Hydrate and Humidify  Drink enough water to keep your urine clear or pale yellow. Staying hydrated will help to thin your mucus.  Use a cool mist humidifier to keep the humidity level in your home above 50%.  Inhale steam for 10-15 minutes, 3-4 times a day or as told by your health care provider. You can do this in the bathroom while a hot shower is running.  Limit your exposure to cool or dry air. Rest  Rest as much as possible.  Sleep with your head raised (elevated).  Make sure to get enough sleep each night. General instructions  Apply a warm, moist washcloth to your face 3-4 times a day or as told by your health care provider. This will help with discomfort.  Wash your hands often with soap and water to reduce your exposure to viruses and other germs. If soap and water are not available, use hand sanitizer.  Do not smoke. Avoid being around people who are smoking (secondhand smoke).  Keep all follow-up visits as told by your health care provider. This is important. Contact a health care provider if:  You have a fever.  Your symptoms get worse.  Your symptoms do not improve within 10 days. Get help right away if:  You have a severe headache.  You have persistent vomiting.  You have pain or swelling around your face or eyes.  You have vision problems.  You develop confusion.  Your neck is stiff.  You have trouble breathing. This information is not intended to replace advice given to you by your health care provider. Make sure you discuss any questions you have with your health care provider. Document Released: 02/09/2005 Document Revised: 10/06/2015 Document Reviewed:  12/05/2014 Elsevier Interactive Patient Education  2017 [employer] Inc.   Consider tumeric 500mg  bid and tens unit with heat and biofreeze.

## 2016-12-24 DIAGNOSIS — Z79899 Other long term (current) drug therapy: Secondary | ICD-10-CM | POA: Diagnosis not present

## 2016-12-24 DIAGNOSIS — M549 Dorsalgia, unspecified: Secondary | ICD-10-CM | POA: Diagnosis not present

## 2016-12-24 DIAGNOSIS — G4700 Insomnia, unspecified: Secondary | ICD-10-CM | POA: Diagnosis not present

## 2016-12-24 DIAGNOSIS — M5136 Other intervertebral disc degeneration, lumbar region: Secondary | ICD-10-CM | POA: Diagnosis not present

## 2016-12-25 ENCOUNTER — Other Ambulatory Visit: Payer: Self-pay | Admitting: Physician Assistant

## 2016-12-25 ENCOUNTER — Other Ambulatory Visit: Payer: Self-pay | Admitting: *Deleted

## 2016-12-25 ENCOUNTER — Telehealth: Payer: Self-pay | Admitting: Physician Assistant

## 2016-12-25 DIAGNOSIS — M79643 Pain in unspecified hand: Secondary | ICD-10-CM | POA: Diagnosis not present

## 2016-12-25 DIAGNOSIS — M1990 Unspecified osteoarthritis, unspecified site: Secondary | ICD-10-CM | POA: Diagnosis not present

## 2016-12-25 DIAGNOSIS — L4050 Arthropathic psoriasis, unspecified: Secondary | ICD-10-CM | POA: Diagnosis not present

## 2016-12-25 DIAGNOSIS — M797 Fibromyalgia: Secondary | ICD-10-CM | POA: Diagnosis not present

## 2016-12-25 DIAGNOSIS — R600 Localized edema: Secondary | ICD-10-CM

## 2016-12-25 MED ORDER — PREDNISONE 50 MG PO TABS: ORAL_TABLET | ORAL | 0 refills | Status: DC

## 2016-12-25 MED ORDER — FUROSEMIDE 20 MG PO TABS: 20.0000 mg | ORAL_TABLET | Freq: Every day | ORAL | 1 refills | Status: DC

## 2016-12-25 NOTE — Telephone Encounter (Signed)
Patient called and advised that she is not feeling any better. And her cough has gotten worse and the prescription you gave her for cough is not helping especially at night. Please Send

## 2016-12-25 NOTE — Telephone Encounter (Signed)
I sent prednisone for 5 days. Delsym for cough. Consider humidfer. Suck on hard candy.

## 2016-12-25 NOTE — Telephone Encounter (Signed)
Patient advised.

## 2016-12-28 DIAGNOSIS — Z79899 Other long term (current) drug therapy: Secondary | ICD-10-CM | POA: Diagnosis not present

## 2016-12-28 DIAGNOSIS — G894 Chronic pain syndrome: Secondary | ICD-10-CM | POA: Diagnosis not present

## 2016-12-29 DIAGNOSIS — Z1231 Encounter for screening mammogram for malignant neoplasm of breast: Secondary | ICD-10-CM | POA: Diagnosis not present

## 2016-12-29 LAB — HM MAMMOGRAPHY

## 2016-12-30 LAB — HM MAMMOGRAPHY

## 2017-01-04 ENCOUNTER — Ambulatory Visit (INDEPENDENT_AMBULATORY_CARE_PROVIDER_SITE_OTHER): Payer: BLUE CROSS/BLUE SHIELD | Admitting: Physician Assistant

## 2017-01-04 NOTE — Progress Notes (Signed)
No show

## 2017-01-05 LAB — HM MAMMOGRAPHY

## 2017-01-08 ENCOUNTER — Ambulatory Visit: Payer: BLUE CROSS/BLUE SHIELD | Admitting: Physician Assistant

## 2017-01-12 ENCOUNTER — Encounter: Payer: Self-pay | Admitting: Physician Assistant

## 2017-01-12 ENCOUNTER — Ambulatory Visit (INDEPENDENT_AMBULATORY_CARE_PROVIDER_SITE_OTHER): Payer: BLUE CROSS/BLUE SHIELD | Admitting: Physician Assistant

## 2017-01-12 VITALS — BP 114/76 | HR 84 | Ht 64.02 in | Wt 352.0 lb

## 2017-01-12 DIAGNOSIS — R202 Paresthesia of skin: Secondary | ICD-10-CM

## 2017-01-12 DIAGNOSIS — G44229 Chronic tension-type headache, not intractable: Secondary | ICD-10-CM

## 2017-01-12 DIAGNOSIS — R200 Anesthesia of skin: Secondary | ICD-10-CM | POA: Diagnosis not present

## 2017-01-12 DIAGNOSIS — F339 Major depressive disorder, recurrent, unspecified: Secondary | ICD-10-CM | POA: Diagnosis not present

## 2017-01-12 DIAGNOSIS — M25522 Pain in left elbow: Secondary | ICD-10-CM | POA: Diagnosis not present

## 2017-01-12 DIAGNOSIS — M79602 Pain in left arm: Secondary | ICD-10-CM | POA: Insufficient documentation

## 2017-01-12 DIAGNOSIS — R059 Cough, unspecified: Secondary | ICD-10-CM

## 2017-01-12 DIAGNOSIS — W1840XA Slipping, tripping and stumbling without falling, unspecified, initial encounter: Secondary | ICD-10-CM | POA: Diagnosis not present

## 2017-01-12 DIAGNOSIS — R05 Cough: Secondary | ICD-10-CM

## 2017-01-12 MED ORDER — MONTELUKAST SODIUM 10 MG PO TABS: 10.0000 mg | ORAL_TABLET | Freq: Every day | ORAL | 3 refills | Status: DC

## 2017-01-12 MED ORDER — VORTIOXETINE HBR 20 MG PO TABS: 20.0000 mg | ORAL_TABLET | Freq: Every day | ORAL | 1 refills | Status: DC

## 2017-01-12 MED ORDER — ALBUTEROL SULFATE HFA 108 (90 BASE) MCG/ACT IN AERS
2.0000 | INHALATION_SPRAY | Freq: Four times a day (QID) | RESPIRATORY_TRACT | 2 refills | Status: DC | PRN
Start: 2017-01-12 — End: 2017-06-25

## 2017-01-12 NOTE — Patient Instructions (Signed)
mucinex 600mg  twice a day.  Consider humidifer.  Restart singulair.

## 2017-01-12 NOTE — Progress Notes (Signed)
Subjective:    Patient ID: Sarah Mayer, female    DOB: Oct 11, 1970, 46 y.o.   MRN: [id number]  HPI  Pt is a 46 yo morbidly obese female with extensive PMH who comes in for follow up of iniatiion of trintellix for depression.   .. Active Ambulatory Problems    Diagnosis Date Noted  . Psoriatic arthritis (HCC) 03/24/2016  . Gastroesophageal reflux disease 03/24/2016  . GAD (generalized anxiety disorder) 03/24/2016  . Iron deficiency anemia 03/24/2016  . History of pulmonary embolism 03/27/2016  . History of DVT (deep vein thrombosis) 03/27/2016  . Chronic tension-type headache, not intractable 03/27/2016  . Primary insomnia 03/27/2016  . Bilateral lower extremity edema 03/27/2016  . Chronic pain syndrome 05/25/2016  . Morbid obesity (HCC) 05/27/2016  . Serum calcium elevated 08/30/2016  . Diarrhea 08/30/2016  . Hyperparathyroidism, primary (HCC) 09/18/2016  . Collagenous colitis 10/18/2016  . Barrett's esophagus without dysplasia 10/18/2016  . Arthralgia 10/18/2016  . Rash and nonspecific skin eruption 10/18/2016  . Memory changes 11/23/2016  . Major depression, recurrent, chronic (HCC) 11/23/2016  . OSA (obstructive sleep apnea) 11/23/2016  . Twitching 12/22/2016  . Cough 12/22/2016  . Bilateral hip pain 12/22/2016  . Tripping over things 01/12/2017  . Left arm pain 01/12/2017  . Left elbow pain 01/12/2017  . Numbness and tingling in left arm 01/17/2017   Resolved Ambulatory Problems    Diagnosis Date Noted  . No Resolved Ambulatory Problems   Past Medical History:  Diagnosis Date  . Chronic pain syndrome 05/25/2016  . Gastroesophageal reflux disease 03/24/2016  . History of DVT (deep vein thrombosis) 03/27/2016  . History of pulmonary embolism 03/27/2016  . IBS (irritable bowel syndrome)   . Microscopic colitis   . Migraine   . Morbid obesity (HCC) 05/27/2016  . Psoriatic arthritis (HCC)    Pt feels like trintellix is greatly helping with cognition, brain fog but  not really helping with depression. She is still really down and sad a lot. She has no motivation to get up and get things going. She denies any SI/HC thoughts. Her pain continues to her down and not able to keep moving.   Pt is on saxenda but continues to gain weight. She gained 8lbs in last 3 weeks. She does not feel like she is eating more. She remains inactive. She would like referral to Dr. Beatrix Shipper[facility]ernadez at [facility].   Pt  Continues to have a mostly dry cough with occasional clear production. Not constant. No SOB, wheezing, fever, chills, sinus pressure, ear pain. Not taking anything for it. She recently finished course of doxycyline. She takes claritin every day.   Pt also complains of shooting left forearm pain with numbness and tingling off and on for past few months. No known trigger. When pain occurs she drops whatever is in her hands. She feels like she senses heat more than what she should. She feels like warm things ar hot. Pt has always been "clumsy" but in last 2 weeks she has been tripping a lot. She has continue daily headaches. No family hx of MS.     [facility], rheumat[facility].   Review of Systems See HPI.     Objective:   Physical Exam  Constitutional: She is oriented to person, place, and time. She appears well-developed and well-nourished.  Morbid obesity.   HENT:  Head: Normocephalic and atraumatic.  Cardiovascular: Normal rate, regular rhythm and normal heart sounds.  Pulmonary/Chest: Effort normal and breath sounds normal. No respiratory  distress. She has no wheezes. She has no rales. She exhibits no tenderness.  Musculoskeletal:  Left arm:  NROM.  Hand grip 5/5.  Tenderness to palpation over lateral epicondye.  Negative phalens or tinels.   Neurological: She is alert and oriented to person, place, and time. She has normal reflexes. No cranial nerve deficit. Coordination normal.  Psychiatric: She has a normal mood and affect. Her behavior is normal.           Assessment & Plan:  [name].[name].Diagnoses and all orders for this visit:  Major depression, recurrent, chronic (HCC) -     vortioxetine HBr (TRINTELLIX) 20 MG TABS; Take 20 mg daily by mouth.  Left elbow pain  Left arm pain  Cough -     montelukast (SINGULAIR) 10 MG tablet; Take 1 tablet (10 mg total) at bedtime by mouth. -     albuterol (PROVENTIL HFA;VENTOLIN HFA) 108 (90 Base) MCG/ACT inhaler; Inhale 2 puffs every 6 (six) hours as needed into the lungs for wheezing or shortness of breath.  Morbid obesity (HCC) -     Amb Referral to Bariatric Surgery  Tripping over things -     MR Brain W Wo Contrast; Future  Chronic tension-type headache, not intractable -     MR Brain W Wo Contrast; Future  Numbness and tingling in left arm -     MR Brain W Wo Contrast; Future   Increased trintellix to 20mg  daily. Follow up in 2 months.  Hopefully this will help more with depression.   For cough could be some post viral cough syndrome. Started singulair and given as needed albuterol. Follow up with spirometry if not improving.   Made referral to bariatrics. I do not think saxenda will continue to be paid for since she has been gaining weight.   Since there was some tenderness over left lateral epicondylitis will treat for tennis elbow. Placed in aircast. Discussed as needed NSAID, ice, rest.  Follow up in 1 month. Consider EMG's if no improvement.   Due to shooting pain and numbness in left forearm, headaches, twitching, tripping I would like to get an MRI of head.

## 2017-01-14 ENCOUNTER — Other Ambulatory Visit: Payer: Self-pay | Admitting: Physician Assistant

## 2017-01-14 ENCOUNTER — Encounter: Payer: Self-pay | Admitting: Physician Assistant

## 2017-01-17 DIAGNOSIS — R200 Anesthesia of skin: Secondary | ICD-10-CM | POA: Insufficient documentation

## 2017-01-17 DIAGNOSIS — R202 Paresthesia of skin: Secondary | ICD-10-CM

## 2017-01-17 NOTE — Telephone Encounter (Signed)
Refilled fiorcet 

## 2017-01-18 ENCOUNTER — Other Ambulatory Visit: Payer: Self-pay | Admitting: Physician Assistant

## 2017-01-20 DIAGNOSIS — M533 Sacrococcygeal disorders, not elsewhere classified: Secondary | ICD-10-CM | POA: Diagnosis not present

## 2017-01-20 DIAGNOSIS — Z79899 Other long term (current) drug therapy: Secondary | ICD-10-CM | POA: Diagnosis not present

## 2017-01-20 DIAGNOSIS — G894 Chronic pain syndrome: Secondary | ICD-10-CM | POA: Diagnosis not present

## 2017-01-21 ENCOUNTER — Ambulatory Visit (INDEPENDENT_AMBULATORY_CARE_PROVIDER_SITE_OTHER): Payer: BLUE CROSS/BLUE SHIELD | Admitting: Physician Assistant

## 2017-01-21 ENCOUNTER — Encounter: Payer: Self-pay | Admitting: Physician Assistant

## 2017-01-21 VITALS — BP 134/86 | HR 81 | Temp 98.0°F | Ht 64.0 in | Wt 355.0 lb

## 2017-01-21 DIAGNOSIS — J40 Bronchitis, not specified as acute or chronic: Secondary | ICD-10-CM

## 2017-01-21 DIAGNOSIS — B370 Candidal stomatitis: Secondary | ICD-10-CM | POA: Diagnosis not present

## 2017-01-21 DIAGNOSIS — J329 Chronic sinusitis, unspecified: Secondary | ICD-10-CM

## 2017-01-21 MED ORDER — PREDNISONE 50 MG PO TABS: ORAL_TABLET | ORAL | 0 refills | Status: DC

## 2017-01-21 MED ORDER — NYSTATIN 100000 UNIT/ML MT SUSP: 500000.0000 [IU] | Freq: Four times a day (QID) | OROMUCOSAL | 1 refills | Status: DC

## 2017-01-21 MED ORDER — AZITHROMYCIN 250 MG PO TABS: ORAL_TABLET | ORAL | 0 refills | Status: DC

## 2017-01-21 MED ORDER — HYDROCODONE-HOMATROPINE 5-1.5 MG/5ML PO SYRP: 5.0000 mL | ORAL_SOLUTION | Freq: Every evening | ORAL | 0 refills | Status: DC | PRN

## 2017-01-21 NOTE — Progress Notes (Deleted)
   Subjective:    Patient ID: Sarah Mayer, female    DOB: 1970-03-30, 46 y.o.   MRN: [id number]  HPI    Review of Systems     Objective:   Physical Exam        Assessment & Plan:

## 2017-01-21 NOTE — Patient Instructions (Signed)

## 2017-01-21 NOTE — Progress Notes (Addendum)
Subjective:    Patient ID: Sarah Mayer, female    DOB: 13-Oct-1970, 46 y.o.   MRN: [id number]  HPI Ms. Dareen Pianonderson is a 46 y/o female who presents today for worsening cough and congestion. She states that she feels like she got a little bit better after being prescribed doxycycline for her sinusitis, however it never cleared up completely. She reports green nasal discharge, sinus congestion, sinus pressure, headache, productive cough, and "rattling" in her chest when she breathes and coughs, low grade fever of 99-100.74F for the last 2 nights, and chest tightness. She states the cough has worsened and she is unable to sleep because it is keeping her up at night. She also reports she thinks she has developed a yeast infection in her mouth from her last dose of antibiotics. She denies ear pain, and sore throat.  .. Active Ambulatory Problems    Diagnosis Date Noted  . Psoriatic arthritis (HCC) 03/24/2016  . Gastroesophageal reflux disease 03/24/2016  . GAD (generalized anxiety disorder) 03/24/2016  . Iron deficiency anemia 03/24/2016  . History of pulmonary embolism 03/27/2016  . History of DVT (deep vein thrombosis) 03/27/2016  . Chronic tension-type headache, not intractable 03/27/2016  . Primary insomnia 03/27/2016  . Bilateral lower extremity edema 03/27/2016  . Chronic pain syndrome 05/25/2016  . Morbid obesity (HCC) 05/27/2016  . Serum calcium elevated 08/30/2016  . Diarrhea 08/30/2016  . Hyperparathyroidism, primary (HCC) 09/18/2016  . Collagenous colitis 10/18/2016  . Barrett's esophagus without dysplasia 10/18/2016  . Arthralgia 10/18/2016  . Rash and nonspecific skin eruption 10/18/2016  . Memory changes 11/23/2016  . Major depression, recurrent, chronic (HCC) 11/23/2016  . OSA (obstructive sleep apnea) 11/23/2016  . Twitching 12/22/2016  . Cough 12/22/2016  . Bilateral hip pain 12/22/2016  . Tripping over things 01/12/2017  . Left arm pain 01/12/2017  . Left elbow pain  01/12/2017  . Numbness and tingling in left arm 01/17/2017   Resolved Ambulatory Problems    Diagnosis Date Noted  . No Resolved Ambulatory Problems   Past Medical History:  Diagnosis Date  . Chronic pain syndrome 05/25/2016  . Gastroesophageal reflux disease 03/24/2016  . History of DVT (deep vein thrombosis) 03/27/2016  . History of pulmonary embolism 03/27/2016  . IBS (irritable bowel syndrome)   . Microscopic colitis   . Migraine   . Morbid obesity (HCC) 05/27/2016  . Psoriatic arthritis (HCC)      Review of Systems  Constitutional: Positive for fatigue and fever.  HENT: Positive for sinus pressure and sinus pain. Negative for ear pain and sore throat.   Respiratory: Positive for cough and chest tightness.   Neurological: Positive for headaches.      Objective:   Physical Exam  Constitutional: She is oriented to person, place, and time. She appears well-developed and well-nourished.  HENT:  Head: Normocephalic and atraumatic.  Right Ear: External ear normal.  Left Ear: External ear normal.  Nose: Nose normal.  Yellow film on tongue.  Tenderness over fontal sinuses.  oropharynx erythematous without tonsil swelling or exudate.   Eyes: Conjunctivae are normal. Right eye exhibits no discharge. Left eye exhibits no discharge.  Cardiovascular: Normal rate and regular rhythm.  Pulmonary/Chest: Effort normal and breath sounds normal. She has no wheezes. She has no rhonchi. She has no rales.  With deep breathing cough ensued.  Lymphadenopathy:    She has no cervical adenopathy.  Neurological: She is alert and oriented to person, place, and time.  Skin: Skin is warm and  dry.  Psychiatric: She has a normal mood and affect. Her behavior is normal. Thought content normal.  Vitals reviewed.     Assessment & Plan:  .[name].[name].[name] was seen today for cough, headache and sinus pressure.  Diagnoses and all orders for this visit:  Sinobronchitis -     predniSONE (DELTASONE) 50 MG tablet;  Take one tablet for 5 days. -     azithromycin (ZITHROMAX) 250 MG tablet; Take 2 tablets now and then one tablet for 4 days. -     HYDROcodone-homatropine (HYCODAN) 5-1.5 MG/5ML syrup; Take 5 mLs by mouth at bedtime as needed.  Oral thrush -     nystatin (MYCOSTATIN) 100000 UNIT/ML suspension; Take 5 mLs (500,000 Units total) by mouth 4 (four) times daily. Swish for 30 seconds and spit out.   Sinobronchitis - Discussed that bronchitis is typically viral but with her being immunosuppressed I am concerned for bacterial infection combined with a bacterial sinus infection. Will prescribe azythromycin and prednisone to reduce inflammation and open her airways. Advised patient that she can continue symptomatic treatment with cough losenges, menthol and her albuterol inhaler. Advised patient that she can use her inhaler every 2-4 hours if needed but this should be for short term, not long term use. Also prescribed patient Hycodan cough syrup to help relieve her night time coughing. Advised patient that this cough syrup contains hydrocodone so it may make her sleepy. She should only take it at night before bedtime and should not drive after using the cough syrup.  [place] controlled substance registry reviewed with no concerns.   Oral Thrush - Prescribed Nystatin mouth wash for possible yeast infection due to recent antibiotic use. Advised patient that she can use this 3-4 times per day for relief of symptoms.

## 2017-01-26 DIAGNOSIS — M1710 Unilateral primary osteoarthritis, unspecified knee: Secondary | ICD-10-CM | POA: Diagnosis not present

## 2017-01-26 DIAGNOSIS — M549 Dorsalgia, unspecified: Secondary | ICD-10-CM | POA: Diagnosis not present

## 2017-01-26 DIAGNOSIS — Z79899 Other long term (current) drug therapy: Secondary | ICD-10-CM | POA: Diagnosis not present

## 2017-01-26 DIAGNOSIS — M5136 Other intervertebral disc degeneration, lumbar region: Secondary | ICD-10-CM | POA: Diagnosis not present

## 2017-02-08 DIAGNOSIS — G894 Chronic pain syndrome: Secondary | ICD-10-CM | POA: Diagnosis not present

## 2017-02-09 ENCOUNTER — Ambulatory Visit (INDEPENDENT_AMBULATORY_CARE_PROVIDER_SITE_OTHER): Payer: BLUE CROSS/BLUE SHIELD | Admitting: Physician Assistant

## 2017-02-09 ENCOUNTER — Encounter: Payer: Self-pay | Admitting: Physician Assistant

## 2017-02-09 VITALS — BP 106/72 | HR 77

## 2017-02-09 DIAGNOSIS — L089 Local infection of the skin and subcutaneous tissue, unspecified: Secondary | ICD-10-CM

## 2017-02-09 DIAGNOSIS — T148XXA Other injury of unspecified body region, initial encounter: Secondary | ICD-10-CM

## 2017-02-09 MED ORDER — DOXYCYCLINE HYCLATE 100 MG PO TABS: 100.0000 mg | ORAL_TABLET | Freq: Two times a day (BID) | ORAL | 0 refills | Status: DC

## 2017-02-09 NOTE — Progress Notes (Deleted)
a 

## 2017-02-09 NOTE — Patient Instructions (Signed)

## 2017-02-09 NOTE — Progress Notes (Addendum)
   Subjective:    Patient ID: Sarah Mayer, female    DOB: 06/30/1970, [age] y.o.   MRN: [id number]  HPI Patient is a 46 y/o female with extensive PMH who presents for laceration of her right lower leg. She reports she ran into a bed frame 2 weeks ago and cut her lower right leg. She reports that it bled a lot for a long time. She states she cleans it with hydrogen peroxide daily and applies Neosporin daily. She has kept it covered with a bandaid. She changes the bandage at night and reports a greenish drainage. She denies fever, chills, or nausea.  Review of Systems See HPI, all other systems reviewed are negative    Objective:   Physical Exam  Constitutional: She is oriented to person, place, and time. She appears well-developed and well-nourished.  HENT:  Head: Normocephalic and atraumatic.  Neurological: She is alert and oriented to person, place, and time.  Skin: Skin is warm and dry. Laceration noted.  4 cm horizontal laceration of the anterior lower leg, just above her ankle. No drainage, mildly tender to palpation, mildly erythematous. Edges of the wound have a darker brownish/black discoloration.  Psychiatric: She has a normal mood and affect. Her behavior is normal. Thought content normal.  Vitals reviewed.     Assessment & Plan:  .[name].[name].Sarah Mayer was seen today for leg wound.  Diagnoses and all orders for this visit:  Wound infection -     doxycycline (VIBRA-TABS) 100 MG tablet; Take 1 tablet (100 mg total) by mouth 2 (two) times daily. For 10 days for wound infection.  Wound infection of lower leg - Cleansed the wound with Hibiclens, covered wound with gauze and applied Coban. Provided patient with prescription for doxycycline for 10 days. Advised patient to cleanse the wound with Hibiclens once a day and alternate between keeping it covered with a bandage and leaving it uncovered to allow oxygen to aid in wound healing. Discussed with patient that she should follow-up if her  symptoms worsen or she develops increased pain, swelling, erythema, warmth or drainage, or develops systemic symptoms such as fever, chills, or nausea.

## 2017-02-24 ENCOUNTER — Other Ambulatory Visit: Payer: Self-pay | Admitting: Physician Assistant

## 2017-02-24 DIAGNOSIS — M797 Fibromyalgia: Secondary | ICD-10-CM | POA: Diagnosis not present

## 2017-02-24 DIAGNOSIS — Z79899 Other long term (current) drug therapy: Secondary | ICD-10-CM | POA: Diagnosis not present

## 2017-02-24 DIAGNOSIS — L4050 Arthropathic psoriasis, unspecified: Secondary | ICD-10-CM | POA: Diagnosis not present

## 2017-02-24 DIAGNOSIS — M1990 Unspecified osteoarthritis, unspecified site: Secondary | ICD-10-CM | POA: Diagnosis not present

## 2017-02-24 DIAGNOSIS — M25522 Pain in left elbow: Secondary | ICD-10-CM | POA: Diagnosis not present

## 2017-02-24 DIAGNOSIS — M79643 Pain in unspecified hand: Secondary | ICD-10-CM | POA: Diagnosis not present

## 2017-02-25 ENCOUNTER — Other Ambulatory Visit: Payer: Self-pay | Admitting: Physician Assistant

## 2017-02-25 DIAGNOSIS — G894 Chronic pain syndrome: Secondary | ICD-10-CM | POA: Diagnosis not present

## 2017-02-26 ENCOUNTER — Telehealth: Payer: Self-pay

## 2017-02-26 ENCOUNTER — Encounter: Payer: Self-pay | Admitting: Physician Assistant

## 2017-02-26 DIAGNOSIS — L089 Local infection of the skin and subcutaneous tissue, unspecified: Secondary | ICD-10-CM

## 2017-02-26 DIAGNOSIS — Z79899 Other long term (current) drug therapy: Secondary | ICD-10-CM | POA: Diagnosis not present

## 2017-02-26 DIAGNOSIS — T148XXA Other injury of unspecified body region, initial encounter: Principal | ICD-10-CM

## 2017-02-26 DIAGNOSIS — M549 Dorsalgia, unspecified: Secondary | ICD-10-CM | POA: Diagnosis not present

## 2017-02-26 DIAGNOSIS — M5136 Other intervertebral disc degeneration, lumbar region: Secondary | ICD-10-CM | POA: Diagnosis not present

## 2017-02-26 MED ORDER — DOXYCYCLINE HYCLATE 100 MG PO TABS: 100.0000 mg | ORAL_TABLET | Freq: Two times a day (BID) | ORAL | 0 refills | Status: DC

## 2017-02-26 NOTE — Telephone Encounter (Signed)
Patient states it is red and warm. Denies all other symptoms. She is going to send a picture.

## 2017-02-26 NOTE — Telephone Encounter (Signed)
Sent msg to patient via mychart.

## 2017-02-26 NOTE — Telephone Encounter (Signed)
[name] called and left a message stating the wound has not healed and she would like another round of antibiotics. Please advise.

## 2017-02-26 NOTE — Telephone Encounter (Signed)
Can she send me a picture via mychart? antibiotics do not help the wound heal it only clears infection if wound is infected. I would like to see what looks like. Is it warm, oozing, swollen, redness? Any fever, chills?

## 2017-03-02 DIAGNOSIS — G894 Chronic pain syndrome: Secondary | ICD-10-CM | POA: Diagnosis not present

## 2017-03-09 ENCOUNTER — Encounter: Payer: Self-pay | Admitting: Physician Assistant

## 2017-03-09 ENCOUNTER — Ambulatory Visit (INDEPENDENT_AMBULATORY_CARE_PROVIDER_SITE_OTHER): Payer: BLUE CROSS/BLUE SHIELD | Admitting: Physician Assistant

## 2017-03-09 VITALS — BP 115/76 | HR 85 | Ht 64.0 in

## 2017-03-09 DIAGNOSIS — G894 Chronic pain syndrome: Secondary | ICD-10-CM | POA: Diagnosis not present

## 2017-03-09 DIAGNOSIS — R21 Rash and other nonspecific skin eruption: Secondary | ICD-10-CM | POA: Diagnosis not present

## 2017-03-09 MED ORDER — CLOBETASOL PROPIONATE 0.05 % EX CREA: 1.0000 "application " | TOPICAL_CREAM | Freq: Two times a day (BID) | CUTANEOUS | 2 refills | Status: DC

## 2017-03-09 MED ORDER — HYDROXYZINE HCL 50 MG PO TABS: 50.0000 mg | ORAL_TABLET | Freq: Every evening | ORAL | 1 refills | Status: DC | PRN

## 2017-03-09 NOTE — Progress Notes (Signed)
   Subjective:    Patient ID: Sarah Mayer, female    DOB: 02/25/1970, 47 y.o.   MRN: [id number]  HPI  Pt is a 47 yo female with psoratic arthritis, collagenous colitis, hyper parathyroidism who presents to the clinic with a rash that comes and goes. She is on stelarga since august 2018. For the last week she has noticed the rash. It appears on both of her anterior lower legs and very red with definite borders. Slightly warm to touch but itchy. Seems to resolve in am. No fever, chills. Never noticed anything like this before.   .. Active Ambulatory Problems    Diagnosis Date Noted  . Psoriatic arthritis (HCC) 03/24/2016  . Gastroesophageal reflux disease 03/24/2016  . GAD (generalized anxiety disorder) 03/24/2016  . Iron deficiency anemia 03/24/2016  . History of pulmonary embolism 03/27/2016  . History of DVT (deep vein thrombosis) 03/27/2016  . Chronic tension-type headache, not intractable 03/27/2016  . Primary insomnia 03/27/2016  . Bilateral lower extremity edema 03/27/2016  . Chronic pain syndrome 05/25/2016  . Morbid obesity (HCC) 05/27/2016  . Serum calcium elevated 08/30/2016  . Diarrhea 08/30/2016  . Hyperparathyroidism, primary (HCC) 09/18/2016  . Collagenous colitis 10/18/2016  . Barrett's esophagus without dysplasia 10/18/2016  . Arthralgia 10/18/2016  . Rash and nonspecific skin eruption 10/18/2016  . Memory changes 11/23/2016  . Major depression, recurrent, chronic (HCC) 11/23/2016  . OSA (obstructive sleep apnea) 11/23/2016  . Twitching 12/22/2016  . Cough 12/22/2016  . Bilateral hip pain 12/22/2016  . Tripping over things 01/12/2017  . Left arm pain 01/12/2017  . Left elbow pain 01/12/2017  . Numbness and tingling in left arm 01/17/2017   Resolved Ambulatory Problems    Diagnosis Date Noted  . No Resolved Ambulatory Problems   Past Medical History:  Diagnosis Date  . Chronic pain syndrome 05/25/2016  . Gastroesophageal reflux disease 03/24/2016  .  History of DVT (deep vein thrombosis) 03/27/2016  . History of pulmonary embolism 03/27/2016  . IBS (irritable bowel syndrome)   . Microscopic colitis   . Migraine   . Morbid obesity (HCC) 05/27/2016  . Psoriatic arthritis (HCC)       Review of Systems    see HPI.  Objective:   Physical Exam  Constitutional: She appears well-developed and well-nourished.  Morbidly obese.   Cardiovascular: Normal rate, regular rhythm and normal heart sounds.  Skin:  Bilateral anterior lower legs to cover most of anterior surface area erythematous patch with definte borders. No scaling. Areas of scabbed over lesion all over legs. All this seen from picture could not load.   In office no erythema, warmth, swelling.          Assessment & Plan:  [name].[name].Diagnoses and all orders for this visit:  Rash and nonspecific skin eruption -     clobetasol cream (TEMOVATE) 0.05 %; Apply 1 application topically 2 (two) times daily. -     hydrOXYzine (ATARAX/VISTARIL) 50 MG tablet; Take 1 tablet (50 mg total) by mouth at bedtime and may repeat dose one time if needed. For itching.   I suspect rash is associated with either her autoimmune diseases or medication side effect. She has dermatologist. I suggest follow up. In meantime reassured her that it did not appear to be infected. Treated with stronger topical steroid to mix with good moisturizer and vistaril for itching. Follow up as needed.

## 2017-03-09 NOTE — Patient Instructions (Signed)
Start using cream when itching.  Vistaril for itching.  Cool compresses.  I suspect auto-immune.

## 2017-03-15 ENCOUNTER — Ambulatory Visit (INDEPENDENT_AMBULATORY_CARE_PROVIDER_SITE_OTHER): Payer: BLUE CROSS/BLUE SHIELD | Admitting: Physician Assistant

## 2017-03-15 ENCOUNTER — Encounter: Payer: Self-pay | Admitting: Physician Assistant

## 2017-03-15 VITALS — BP 148/70 | HR 76 | Ht 64.0 in | Wt 350.0 lb

## 2017-03-15 DIAGNOSIS — G44229 Chronic tension-type headache, not intractable: Secondary | ICD-10-CM

## 2017-03-15 DIAGNOSIS — R4789 Other speech disturbances: Secondary | ICD-10-CM | POA: Diagnosis not present

## 2017-03-15 DIAGNOSIS — G43011 Migraine without aura, intractable, with status migrainosus: Secondary | ICD-10-CM

## 2017-03-15 DIAGNOSIS — R413 Other amnesia: Secondary | ICD-10-CM

## 2017-03-15 NOTE — Patient Instructions (Addendum)
emgality to consider injections for migraine.  MRI for headaches.

## 2017-03-15 NOTE — Addendum Note (Signed)
Addended by: Jomarie LongsBREEBACK, Marceil Welp L on: 03/15/2017 05:32 PM   Modules accepted: Level of Service

## 2017-03-15 NOTE — Progress Notes (Addendum)
   Subjective:    Patient ID: Sarah Mayer, female    DOB: May 06, 1970, 47 y.o.   MRN: [id number]  HPI  Pt is a 47 year old female with a history of extensive PMH who comes in for a follow up of a dose increase of Trintellix for depression. At the last visit she reported episodes of  Cognitive fog and difficulty finding words mid-sentence. Once the dose was increased to 20mg , those episodes stopped, but have recently re-surface a few times in the past few weeks. She believes the medication has benefited her mood and she has had less crying spells than normal. She would like to continue the medication at this dose. She does still struggle with her depression and anxiety as her psoriatic arthritis and fibromyalgia are still not under control    Headaches : Has had headaches since early teens, at this time she has approximately 3 heachaches a week with durations lasting between 1-3 days. She has tried topamax which resulted in a rash, Inderal which did not provide preventive relief, Imitrex oral and by injection which caused negative side effects including sedation and dizziness. She describes headache as one side that radiates to the back on eye and alternates sides.    Review of Systems  Constitutional: Positive for chills and fever.  HENT: Negative for congestion.   Cardiovascular: Negative for chest pain and palpitations.  Neurological: Positive for headaches.  Psychiatric/Behavioral: Negative for confusion and suicidal ideas.       Objective:   Physical Exam  Constitutional: She is oriented to person, place, and time. She appears well-developed and well-nourished. No distress.  HENT:  Head: Normocephalic and atraumatic.  Cardiovascular: Normal rate, regular rhythm and normal heart sounds. Exam reveals no gallop and no friction rub.  No murmur heard. Pulmonary/Chest: Effort normal and breath sounds normal.  Neurological: She is alert and oriented to person, place, and time.   Psychiatric: She has a normal mood and affect. Her behavior is normal.   Vitals:   03/15/17 1341  BP: (!) 148/70  Pulse: 76       Assessment & Plan:  [name] was seen today for depression.  Diagnoses and all orders for this visit:  Chronic tension-type headache, not intractable  Intractable migraine without aura and with status migrainosus  Memory changes  Word finding difficulty   [name] was given an option today regarding her chronic headaches. We discussed [employer] for migraine prevention and will be calling for a prior authorization as well as samples to begin the loading dose. She is going to continue the Trintellix 20mg  and will consider getting an MRI to evaluate her chronic migraines and word finding difficulty. We will contact her once we hear back on [facility].

## 2017-03-16 ENCOUNTER — Other Ambulatory Visit: Payer: Self-pay

## 2017-03-16 DIAGNOSIS — G44229 Chronic tension-type headache, not intractable: Secondary | ICD-10-CM

## 2017-03-17 ENCOUNTER — Encounter: Payer: Self-pay | Admitting: Physician Assistant

## 2017-03-17 ENCOUNTER — Ambulatory Visit (INDEPENDENT_AMBULATORY_CARE_PROVIDER_SITE_OTHER): Payer: BLUE CROSS/BLUE SHIELD | Admitting: Physician Assistant

## 2017-03-17 VITALS — BP 122/71 | HR 71 | Ht 64.0 in | Wt 349.0 lb

## 2017-03-17 DIAGNOSIS — Z0000 Encounter for general adult medical examination without abnormal findings: Secondary | ICD-10-CM | POA: Diagnosis not present

## 2017-03-17 DIAGNOSIS — Z131 Encounter for screening for diabetes mellitus: Secondary | ICD-10-CM

## 2017-03-17 DIAGNOSIS — M25551 Pain in right hip: Secondary | ICD-10-CM

## 2017-03-17 DIAGNOSIS — Z13220 Encounter for screening for lipoid disorders: Secondary | ICD-10-CM

## 2017-03-17 DIAGNOSIS — Z23 Encounter for immunization: Secondary | ICD-10-CM

## 2017-03-17 DIAGNOSIS — E559 Vitamin D deficiency, unspecified: Secondary | ICD-10-CM

## 2017-03-17 DIAGNOSIS — L4050 Arthropathic psoriasis, unspecified: Secondary | ICD-10-CM

## 2017-03-17 DIAGNOSIS — R7989 Other specified abnormal findings of blood chemistry: Secondary | ICD-10-CM

## 2017-03-17 DIAGNOSIS — M25552 Pain in left hip: Secondary | ICD-10-CM | POA: Diagnosis not present

## 2017-03-17 MED ORDER — METHYLPREDNISOLONE ACETATE 40 MG/ML IJ SUSP
40.0000 mg | Freq: Once | INTRAMUSCULAR | Status: AC
  Administered 2017-03-17: 40 mg via INTRAMUSCULAR

## 2017-03-17 MED ORDER — LIRAGLUTIDE -WEIGHT MANAGEMENT 18 MG/3ML ~~LOC~~ SOPN: 3.0000 mg | PEN_INJECTOR | Freq: Every day | SUBCUTANEOUS | 2 refills | Status: DC

## 2017-03-17 NOTE — Progress Notes (Signed)
Subjective:     Sarah Mayer is a 47 y.o.,morbidly obese female  and is here for a comprehensive physical exam. The patient reports problems - pt would like to discuss getting a steroid shot like she got in october for her overall hip pain. [facility] and seemed to help for a few months. .  Social History   Socioeconomic History  . Marital status: Significant Other    Spouse name: Not on file  . Number of children: Not on file  . Years of education: Not on file  . Highest education level: Not on file  Social Needs  . Financial resource strain: Not on file  . Food insecurity - worry: Not on file  . Food insecurity - inability: Not on file  . Transportation needs - medical: Not on file  . Transportation needs - non-medical: Not on file  Occupational History  . Not on file  Tobacco Use  . Smoking status: Never Smoker  . Smokeless tobacco: Never Used  Substance and Sexual Activity  . Alcohol use: No  . Drug use: No  . Sexual activity: Not Currently  Other Topics Concern  . Not on file  Social History Narrative  . Not on file   Health Maintenance  Topic Date Due  . HIV Screening  05/18/1986  . MAMMOGRAM  12/31/2018  . PAP SMEAR  12/04/2019  . TETANUS/TDAP  08/12/2021  . INFLUENZA VACCINE  Completed    The following portions of the patient's history were reviewed and updated as appropriate: allergies, current medications, past family history, past medical history, past social history, past surgical history and problem list.  Review of Systems Pertinent items noted in HPI and remainder of comprehensive ROS otherwise negative.   Objective:    BP 122/71   Pulse 71   Ht 5\' 4"  (1.626 m)   Wt (!) 349 lb (158.3 kg)   BMI 59.91 kg/m  General appearance: alert, cooperative, appears stated age and morbidly obese Head: Normocephalic, without obvious abnormality, atraumatic Eyes: conjunctivae/corneas clear. PERRL, EOM's intact.  Fundi benign. Ears: normal TM's and external ear canals both ears Nose: Nares normal. Septum midline. Mucosa normal. No drainage or sinus tenderness. Throat: lips, mucosa, and tongue normal; teeth and gums normal Neck: no adenopathy, no carotid bruit, no JVD, supple, symmetrical, trachea midline and thyroid not enlarged, symmetric, no tenderness/mass/nodules Back: symmetric, no curvature. ROM normal. No CVA tenderness. Lungs: clear to auscultation bilaterally Heart: regular rate and rhythm, S1, S2 normal, no murmur, click, rub or gallop Abdomen: soft, non-tender; bowel sounds normal; no masses,  no organomegaly Extremities: extremities normal, atraumatic, no cyanosis or edema Pulses: 2+ and symmetric Skin: Skin color, texture, turgor normal. No rashes or lesions Lymph nodes: Cervical, supraclavicular, and axillary nodes normal. Neurologic: Alert and oriented X 3, normal strength and tone. Normal symmetric reflexes. Normal coordination and gait    Assessment:    Healthy female exam.      Plan:    Sarah Mayer.Sarah Mayer.Sarah CruiseKristin was seen today for annual exam.  Diagnoses and all orders for this visit:  Routine physical examination -     Lipid Panel w/reflex Direct LDL -     COMPLETE METABOLIC PANEL WITH GFR -     TSH -     Vitamin D 1,25 dihydroxy -     B12  Vitamin D deficiency -     Vitamin D 1,25 dihydroxy  Screening for diabetes mellitus -     COMPLETE METABOLIC  PANEL WITH GFR  Screening for lipid disorders -     Lipid Panel w/reflex Direct LDL  Need for pneumococcal vaccination  Bilateral hip pain -     methylPREDNISolone acetate (DEPO-MEDROL) injection 40 mg  Psoriatic arthritis (HCC) -     methylPREDNISolone acetate (DEPO-MEDROL) injection 40 mg  Morbidly obese (HCC) -     Liraglutide -Weight Management (SAXENDA) 18 MG/3ML SOPN; Inject 3 mg into the skin daily.  Other orders -     Pneumococcal polysaccharide vaccine 23-valent greater than or equal to 2yo  subcutaneous/IM   .Sarah Mayer Depression screen [facility] 2/9 03/15/2017 11/23/2016 05/25/2016  Decreased Interest 1 1 1   Down, Depressed, Hopeless 1 1 3   PHQ - 2 Score 2 2 4   Altered sleeping 3 3 2   Tired, decreased energy 3 3 3   Change in appetite 1 1 2   Feeling bad or failure about yourself  0 1 0  Trouble concentrating 1 3 2   Moving slowly or fidgety/restless 1 3 3   Suicidal thoughts 0 0 0  PHQ-9 Score 11 16 16    .. GAD 7 : Generalized Anxiety Score 03/15/2017  Nervous, Anxious, on Edge 1  Control/stop worrying 1  Worry too much - different things 1  Trouble relaxing 2  Restless 0  Easily annoyed or irritable 0  Afraid - awful might happen 0  Total GAD 7 Score 5   Pap and mammogram up to date.  Given pneumonia vaccine before 65 due to her immunosuppressed state.  Fasting labs ordered.   Discussed 150 minutes of exercise a week.  Encouraged vitamin D 1000 units and Calcium 1300mg  or 4 servings of dairy a day.  Sarah Mayer.Discussed low carb diet with 1500 calories and 80g of protein.  Exercising at least 150 minutes a week.  [employer] could be a Chief Technology Officer.  Refilled saxenda to stay on it.  Referral made for bariatric surgery and has appt in February.    We don't have to review for rheumatology. Pt filled out request today.  Depo medrol given for flare per patient rheumatologist has given this to her before and worked well.     See After Visit Summary for Counseling Recommendations

## 2017-03-17 NOTE — Patient Instructions (Signed)

## 2017-03-19 ENCOUNTER — Encounter: Payer: Self-pay | Admitting: Physician Assistant

## 2017-03-22 DIAGNOSIS — G894 Chronic pain syndrome: Secondary | ICD-10-CM | POA: Diagnosis not present

## 2017-03-22 DIAGNOSIS — Z79899 Other long term (current) drug therapy: Secondary | ICD-10-CM | POA: Diagnosis not present

## 2017-03-22 DIAGNOSIS — M545 Low back pain: Secondary | ICD-10-CM | POA: Diagnosis not present

## 2017-03-22 DIAGNOSIS — M25559 Pain in unspecified hip: Secondary | ICD-10-CM | POA: Diagnosis not present

## 2017-03-22 DIAGNOSIS — M542 Cervicalgia: Secondary | ICD-10-CM | POA: Diagnosis not present

## 2017-03-22 DIAGNOSIS — Z79891 Long term (current) use of opiate analgesic: Secondary | ICD-10-CM | POA: Diagnosis not present

## 2017-03-22 LAB — COMPLETE METABOLIC PANEL WITH GFR
AG Ratio: 1.5 (calc) (ref 1.0–2.5)
ALBUMIN MSPROF: 3.8 g/dL (ref 3.6–5.1)
ALKALINE PHOSPHATASE (APISO): 116 U/L — AB (ref 33–115)
ALT: 18 U/L (ref 6–29)
AST: 18 U/L (ref 10–35)
BILIRUBIN TOTAL: 0.5 mg/dL (ref 0.2–1.2)
BUN: 9 mg/dL (ref 7–25)
CHLORIDE: 103 mmol/L (ref 98–110)
CO2: 28 mmol/L (ref 20–32)
CREATININE: 0.87 mg/dL (ref 0.50–1.10)
Calcium: 9.9 mg/dL (ref 8.6–10.2)
GFR, EST AFRICAN AMERICAN: 93 mL/min/{1.73_m2} (ref >=60)
GFR, Est Non African American: 80 mL/min/{1.73_m2} (ref >=60)
GLUCOSE: 84 mg/dL (ref 65–99)
Globulin: 2.5 g/dL (calc) (ref 1.9–3.7)
Potassium: 4.3 mmol/L (ref 3.5–5.3)
Sodium: 140 mmol/L (ref 135–146)
TOTAL PROTEIN: 6.3 g/dL (ref 6.1–8.1)

## 2017-03-22 LAB — LIPID PANEL W/REFLEX DIRECT LDL
Cholesterol: 140 mg/dL (ref <200)
HDL: 48 mg/dL — ABNORMAL LOW (ref >50)
LDL Cholesterol (Calc): 78 mg/dL (calc)
NON-HDL CHOLESTEROL (CALC): 92 mg/dL (ref <130)
TRIGLYCERIDES: 62 mg/dL (ref <150)
Total CHOL/HDL Ratio: 2.9 (calc) (ref <5.0)

## 2017-03-22 LAB — TSH: TSH: 0.3 m[IU]/L — AB

## 2017-03-22 LAB — TEST AUTHORIZATION

## 2017-03-22 LAB — VITAMIN B12: VITAMIN B 12: 387 pg/mL (ref 200–1100)

## 2017-03-22 LAB — T3, FREE: T3 FREE: 3 pg/mL (ref 2.3–4.2)

## 2017-03-22 LAB — VITAMIN D 1,25 DIHYDROXY
VITAMIN D 1, 25 (OH) TOTAL: 64 pg/mL (ref 18–72)
VITAMIN D3 1, 25 (OH): 64 pg/mL

## 2017-03-22 LAB — T3: T3 TOTAL: 91 ng/dL (ref 76–181)

## 2017-03-22 LAB — T4: T4 TOTAL: 8.4 ug/dL (ref 5.1–11.9)

## 2017-03-22 LAB — T4, FREE: Free T4: 1.3 ng/dL (ref 0.8–1.8)

## 2017-03-23 ENCOUNTER — Encounter: Payer: Self-pay | Admitting: Physician Assistant

## 2017-03-23 DIAGNOSIS — G894 Chronic pain syndrome: Secondary | ICD-10-CM | POA: Diagnosis not present

## 2017-03-25 DIAGNOSIS — R200 Anesthesia of skin: Secondary | ICD-10-CM | POA: Diagnosis not present

## 2017-03-25 DIAGNOSIS — M79609 Pain in unspecified limb: Secondary | ICD-10-CM | POA: Diagnosis not present

## 2017-03-30 DIAGNOSIS — G894 Chronic pain syndrome: Secondary | ICD-10-CM | POA: Diagnosis not present

## 2017-04-01 DIAGNOSIS — Z79899 Other long term (current) drug therapy: Secondary | ICD-10-CM | POA: Diagnosis not present

## 2017-04-02 ENCOUNTER — Other Ambulatory Visit: Payer: Self-pay | Admitting: Physician Assistant

## 2017-04-02 ENCOUNTER — Telehealth: Payer: Self-pay | Admitting: *Deleted

## 2017-04-02 DIAGNOSIS — R7989 Other specified abnormal findings of blood chemistry: Secondary | ICD-10-CM | POA: Diagnosis not present

## 2017-04-02 LAB — TSH: TSH: 0.76 mIU/L

## 2017-04-02 NOTE — Telephone Encounter (Signed)
TSH ordered to recheck levels. 

## 2017-04-05 ENCOUNTER — Other Ambulatory Visit: Payer: Self-pay | Admitting: Physician Assistant

## 2017-04-09 DIAGNOSIS — Z79891 Long term (current) use of opiate analgesic: Secondary | ICD-10-CM | POA: Diagnosis not present

## 2017-04-09 DIAGNOSIS — Z79899 Other long term (current) drug therapy: Secondary | ICD-10-CM | POA: Diagnosis not present

## 2017-04-09 DIAGNOSIS — M792 Neuralgia and neuritis, unspecified: Secondary | ICD-10-CM | POA: Diagnosis not present

## 2017-04-09 DIAGNOSIS — G609 Hereditary and idiopathic neuropathy, unspecified: Secondary | ICD-10-CM | POA: Diagnosis not present

## 2017-04-09 DIAGNOSIS — G894 Chronic pain syndrome: Secondary | ICD-10-CM | POA: Diagnosis not present

## 2017-04-09 DIAGNOSIS — F4542 Pain disorder with related psychological factors: Secondary | ICD-10-CM | POA: Diagnosis not present

## 2017-04-11 ENCOUNTER — Other Ambulatory Visit: Payer: Self-pay | Admitting: Physician Assistant

## 2017-04-11 DIAGNOSIS — F339 Major depressive disorder, recurrent, unspecified: Secondary | ICD-10-CM

## 2017-04-12 ENCOUNTER — Encounter: Payer: Self-pay | Admitting: Endocrinology

## 2017-04-12 ENCOUNTER — Ambulatory Visit: Payer: BLUE CROSS/BLUE SHIELD | Admitting: Endocrinology

## 2017-04-12 DIAGNOSIS — E210 Primary hyperparathyroidism: Secondary | ICD-10-CM

## 2017-04-12 LAB — VITAMIN D 25 HYDROXY (VIT D DEFICIENCY, FRACTURES): VITD: 36.71 ng/mL (ref 30.00–100.00)

## 2017-04-12 NOTE — Patient Instructions (Addendum)
blood tests are requested for you today.  We'll let you know about the results. Our plan will be to have plenty of vitamin-D, to see if that can keep the parathyroid level is los as possible.  Please come back for a follow-up appointment in 6 months

## 2017-04-12 NOTE — Progress Notes (Signed)
Subjective:    Patient ID: Sarah Mayer, female    DOB: March 15, 1970, 47 y.o.   MRN: [id number]  HPI Pt return for f/u of primary hyperparathyroidism (dx'ed 2018; she has never had osteoporosis; only bony fracture was right ankle many years ago, but she has h/o urolithiasis many years ago (uncertain cause); DEXA in 2018 showed worst T-score was -1.1; she takes vit-D, 2000 units/d).  she has leg cramps Past Medical History:  Diagnosis Date  . Chronic pain syndrome 05/25/2016  . Gastroesophageal reflux disease 03/24/2016  . History of DVT (deep vein thrombosis) 03/27/2016  . History of pulmonary embolism 03/27/2016  . IBS (irritable bowel syndrome)   . Microscopic colitis   . Migraine   . Morbid obesity (HCC) 05/27/2016  . Psoriatic arthritis [facility](HCC)     Past Surgical History:  Procedure Laterality Date  . COLONOSCOPY W/ BIOPSIES    . ESOPHAGOGASTRODUODENOSCOPY      Social History   Socioeconomic History  . Marital status: Significant Other    Spouse name: Not on file  . Number of children: Not on file  . Years of education: Not on file  . Highest education level: Not on file  Social Needs  . Financial resource strain: Not on file  . Food insecurity - worry: Not on file  . Food insecurity - inability: Not on file  . Transportation needs - medical: Not on file  . Transportation needs - non-medical: Not on file  Occupational History  . Not on file  Tobacco Use  . Smoking status: Never Smoker  . Smokeless tobacco: Never Used  Substance and Sexual Activity  . Alcohol use: No  . Drug use: No  . Sexual activity: Not Currently  Other Topics Concern  . Not on file  Social History Narrative  . Not on file    Current Outpatient Medications on File Prior to Visit  Medication Sig Dispense Refill  . albuterol (PROVENTIL HFA;VENTOLIN HFA) 108 (90 Base) MCG/ACT inhaler Inhale 2 puffs every 6 (six) hours as needed into the lungs for wheezing or shortness of breath. 1 Inhaler 2  .  AMBULATORY NON FORMULARY MEDICATION Ultra Fine  6mm pen needles to be used with Saxenda 100 each 1  . butalbital-acetaminophen-caffeine (FIORICET WITH CODEINE) 50-325-40-30 MG capsule TAKE ONE CAPSULE BY MOUTH EVERY 4 HOURS AS NEEDED 30 capsule 1  . clobetasol cream (TEMOVATE) 0.05 % Apply 1 application topically 2 (two) times daily. 60 g 2  . cyclobenzaprine (FLEXERIL) 10 MG tablet Take 5-10 mg by mouth.    . diclofenac sodium (VOLTAREN) 1 % GEL APPLY 4 GRAMS TOPICALLY FOUR (4) TIMES A DAY.  3  . fexofenadine (ALLEGRA) 180 MG tablet Take 180 mg by mouth.    . fluocinonide cream (LIDEX) 0.05 % Apply topically.    . furosemide (LASIX) 20 MG tablet Take 1 tablet (20 mg total) by mouth daily. 90 tablet 1  . gabapentin (NEURONTIN) 300 MG capsule Take 600 mg by mouth.    [name]. HYDROcodone-acetaminophen (NORCO/VICODIN) 5-325 MG tablet Take by mouth.    . hydrOXYzine (ATARAX/VISTARIL) 50 MG tablet Take 1 tablet (50 mg total) by mouth at bedtime and may repeat dose one time if needed. For itching. 30 tablet 1  . HYSINGLA ER 30 MG T24A     . Liraglutide -Weight Management (SAXENDA) 18 MG/3ML SOPN Inject 3 mg into the skin daily. 4 pen 2  . montelukast (SINGULAIR) 10 MG tablet Take 1 tablet (10 mg total) at bedtime  by mouth. 90 tablet 3  . nystatin (MYCOSTATIN) 100000 UNIT/ML suspension Take 5 mLs (500,000 Units total) by mouth 4 (four) times daily. Swish for 30 seconds and spit out. 180 mL 1  . Omega-3 Fatty Acids (FISH OIL) 1000 MG CPDR Take by mouth.    . RABEprazole (ACIPHEX) 20 MG tablet Take 1 tablet (20 mg total) by mouth daily. 30 tablet 11  . ranitidine (ZANTAC) 150 MG capsule Take 150 mg by mouth 2 (two) times daily.    . Salicylic Acid 6 % SHAM     . STELARA 90 MG/ML SOSY injection     . vortioxetine HBr (TRINTELLIX) 20 MG TABS Take 20 mg daily by mouth. 90 tablet 1  . zolpidem (AMBIEN) 5 MG tablet Take 1 tablet (5 mg total) by mouth at bedtime as needed. 30 tablet 5   No current  facility-administered medications on file prior to visit.     Allergies  Allergen Reactions  . Imitrex [Sumatriptan]     Dizziness/somulence/nausea  . Topamax [Topiramate]     rash  . Penicillins Rash  . Sulfa Antibiotics Rash  . Sumatriptan Succinate Nausea And Vomiting    19 Jan 2011    History reviewed. No pertinent family history.  BP 110/70 (BP Location: Left Arm, Patient Position: Sitting, Cuff Size: Normal)   Pulse 80   Wt (!) 343 lb (155.6 kg)   SpO2 95%   BMI 58.88 kg/m   Review of Systems Denies numbness    Objective:   Physical Exam VITAL SIGNS:  See vs page GENERAL: no distress.  In wheelchair NECK: There is no palpable thyroid enlargement.  No thyroid nodule is palpable.  No palpable lymphadenopathy at the anterior neck.       Assessment & Plan:  Primary hyperparathyroidism: due for recheck Vit-D def: due for recheck.  Patient Instructions  blood tests are requested for you today.  We'll let you know about the results. Our plan will be to have plenty of vitamin-D, to see if that can keep the parathyroid level is los as possible.  Please come back for a follow-up appointment in 6 months

## 2017-04-13 LAB — PTH, INTACT AND CALCIUM
Calcium: 10.5 mg/dL — ABNORMAL HIGH (ref 8.6–10.2)
PTH: 90 pg/mL — AB (ref 14–64)

## 2017-04-15 DIAGNOSIS — G894 Chronic pain syndrome: Secondary | ICD-10-CM | POA: Diagnosis not present

## 2017-04-18 ENCOUNTER — Encounter: Payer: Self-pay | Admitting: Physician Assistant

## 2017-04-19 DIAGNOSIS — M542 Cervicalgia: Secondary | ICD-10-CM | POA: Diagnosis not present

## 2017-04-19 DIAGNOSIS — Z79899 Other long term (current) drug therapy: Secondary | ICD-10-CM | POA: Diagnosis not present

## 2017-04-19 DIAGNOSIS — Z79891 Long term (current) use of opiate analgesic: Secondary | ICD-10-CM | POA: Diagnosis not present

## 2017-04-19 DIAGNOSIS — G894 Chronic pain syndrome: Secondary | ICD-10-CM | POA: Diagnosis not present

## 2017-04-19 DIAGNOSIS — M25559 Pain in unspecified hip: Secondary | ICD-10-CM | POA: Diagnosis not present

## 2017-04-19 DIAGNOSIS — M545 Low back pain: Secondary | ICD-10-CM | POA: Diagnosis not present

## 2017-04-21 ENCOUNTER — Encounter: Payer: Self-pay | Admitting: Physician Assistant

## 2017-04-22 ENCOUNTER — Ambulatory Visit: Payer: BLUE CROSS/BLUE SHIELD | Admitting: Physician Assistant

## 2017-04-22 DIAGNOSIS — J208 Acute bronchitis due to other specified organisms: Secondary | ICD-10-CM | POA: Diagnosis not present

## 2017-04-22 DIAGNOSIS — J101 Influenza due to other identified influenza virus with other respiratory manifestations: Secondary | ICD-10-CM | POA: Diagnosis not present

## 2017-05-03 ENCOUNTER — Other Ambulatory Visit: Payer: Self-pay | Admitting: Physician Assistant

## 2017-05-03 DIAGNOSIS — F5101 Primary insomnia: Secondary | ICD-10-CM

## 2017-05-07 ENCOUNTER — Other Ambulatory Visit: Payer: Self-pay | Admitting: Physician Assistant

## 2017-05-07 DIAGNOSIS — R21 Rash and other nonspecific skin eruption: Secondary | ICD-10-CM

## 2017-05-12 DIAGNOSIS — L4050 Arthropathic psoriasis, unspecified: Secondary | ICD-10-CM | POA: Diagnosis not present

## 2017-05-13 DIAGNOSIS — Z79899 Other long term (current) drug therapy: Secondary | ICD-10-CM | POA: Diagnosis not present

## 2017-05-13 DIAGNOSIS — M797 Fibromyalgia: Secondary | ICD-10-CM | POA: Diagnosis not present

## 2017-05-13 DIAGNOSIS — M79643 Pain in unspecified hand: Secondary | ICD-10-CM | POA: Diagnosis not present

## 2017-05-13 DIAGNOSIS — L4050 Arthropathic psoriasis, unspecified: Secondary | ICD-10-CM | POA: Diagnosis not present

## 2017-05-13 DIAGNOSIS — M1990 Unspecified osteoarthritis, unspecified site: Secondary | ICD-10-CM | POA: Diagnosis not present

## 2017-05-14 DIAGNOSIS — L408 Other psoriasis: Secondary | ICD-10-CM | POA: Diagnosis not present

## 2017-05-17 DIAGNOSIS — Z79899 Other long term (current) drug therapy: Secondary | ICD-10-CM | POA: Diagnosis not present

## 2017-05-17 DIAGNOSIS — Z79891 Long term (current) use of opiate analgesic: Secondary | ICD-10-CM | POA: Diagnosis not present

## 2017-05-17 DIAGNOSIS — G5600 Carpal tunnel syndrome, unspecified upper limb: Secondary | ICD-10-CM | POA: Diagnosis not present

## 2017-05-17 DIAGNOSIS — M542 Cervicalgia: Secondary | ICD-10-CM | POA: Diagnosis not present

## 2017-05-17 DIAGNOSIS — M25559 Pain in unspecified hip: Secondary | ICD-10-CM | POA: Diagnosis not present

## 2017-05-17 DIAGNOSIS — M545 Low back pain: Secondary | ICD-10-CM | POA: Diagnosis not present

## 2017-05-17 DIAGNOSIS — G894 Chronic pain syndrome: Secondary | ICD-10-CM | POA: Diagnosis not present

## 2017-06-04 ENCOUNTER — Ambulatory Visit: Payer: BLUE CROSS/BLUE SHIELD

## 2017-06-04 DIAGNOSIS — L570 Actinic keratosis: Secondary | ICD-10-CM

## 2017-06-04 DIAGNOSIS — L814 Other melanin hyperpigmentation: Secondary | ICD-10-CM

## 2017-06-04 DIAGNOSIS — L723 Sebaceous cyst: Secondary | ICD-10-CM

## 2017-06-04 DIAGNOSIS — D229 Melanocytic nevi, unspecified: Secondary | ICD-10-CM

## 2017-06-04 NOTE — Progress Notes
[facility]  Clinic Visit    Patient's Name:                 Vanessa Terry  Medical Record Number:  [id number]  Date of Birth:                  01-01-1971  Date of Visit:                  06/04/2017  Referring Provider:          Bradd Canary., MD  Last dermatology clinic visit: Visit date not found    CC: lesion on the left buttock, cheek, skin check    HPI: This is a Dermatology Clinic visit for Vanessa Terry who is a(n) 47 y.o. old female who reports a lesion on the left buttock, present on and off for years, gets worse with pressure/prolonged sitting, drained in the past, hoping it can be removed.  Also with a scaly lesion on the right cheek for weeks, no treatment, asymptomatic.  Otherwise denies any new, changing, growing, bleeding or concerning lesions.      ???PMH/Meds/All/Soc/Fam hx:   Personal Hx skin cancer:  no history of melanoma  Fam Hx skin cancer:  no FH of melanoma    PAST MEDICAL HISTORY:   Past Medical History:   Diagnosis Date   ??? Coccyx pain    ??? Constipation    ??? Tinnitus of left ear    ??? Vertigo        MEDICATIONS:  Current Outpatient Prescriptions   Medication Sig   ??? alprazolam 0.25 mg tablet Take 1 tablet 30 mins prior to procedure, repeat as needed. Maximum: 2mg s in 24 hrs.[name]   ??? cefuroxime 250 mg tablet Take 250 mg by mouth two (2) times daily.   ??? ciprofloxacin 500 mg tablet    ??? estrogens, conjugated, 0.3 mg tablet Take 0.3 mg by mouth daily Patient unsure of dose. [name]   ??? follitropin beta (FOLLISTIM) 900 unit/1.08 mL injection Inject under the skin daily.   ??? leuprolide 7.5 mg injection Inject 7.5 mg into the muscle every twenty eight (28) days.   ??? menotropins 75 units injection Inject into the muscle once.   ??? naproxen 500 mg tablet Take one pill twice daily for the next week and then every 12 hours as needed.   ??? nitrofurantoin, macrocrystal monohydrate, (MACROBID) 100 mg capsule    ??? phenazopyridine 200 mg tablet ??? sertraline 25 mg tablet Take 1 tablet (25 mg total) by mouth daily.     No current facility-administered medications for this visit.    [name]    ALLERGIES:  Allergies   Allergen Reactions   ??? Sulfamethoxazole-Trimethoprim      Eye swelling       SOCIAL HISTORY:  Social History     Social History   ??? Marital status: Married     Spouse name: N/A   ??? Number of children: N/A   ??? Years of education: N/A     Social History Main Topics   ??? Smoking status: Former Smoker   ??? Smokeless tobacco: Never Used      Comment: quit 10 yrs ago, smoked socially x15 yrs   ??? Alcohol use No   ??? Drug use: No   ??? Sexual activity: Yes     Partners: Male     Birth control/ protection: None     Other Topics Concern   ??? None  Social History Narrative    Home: Lives in [place] with husband and 2 kids (twins); originally from [place], then [place] x[place] yrs moved here in 2010; recent changes: no changes other than potentially moving dad to come live with her (in [place]) as she is DPOA and managing his health care, going back and forth to [place] to are for dad;  living situation and social supports: good    Family and Relationships: sister lives in [place], one in [place], 2 brothers in [place], dad is in [place] as above. Husband and kids as above.     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  Total body skin exam includes scalp, head/face, conjunctivae/lids, lips/gums/teeth, neck, chest/breasts/axillae, back, abdomen, groin/genitalia/buttocks, left upper extremity, right upper extremity, left lower extremity, right lower extremity, digits/nails  Patient declines a genital and groin exam.  Right cheek with thin gritty papule  Left buttock with erythematous nodule, overlying scale, non-tender, non-fluctuant.  Scattered light brown evenly pigmented macules and thin papules on the trunk and extremities.  Several light brown macules, evenly pigmented with moth eaten borders on the face, hands      ASSESSMENT AND PLAN:  1. Actinic Keratosis, sites involved: right cheek  - Natural history reviewed including small risk of conversion to squamous cell carcinoma over time  - Importance of self-skin exam and photoprotection reviewed  - Discussed benefits and risks of liquid nitrogen treatment, including but not limited to scarring, postinflammatory pigment changes, blistering, pain, infection  - Pt opts for treatment: LN2 administered for 12 sec x 1 cycle x 1 lesions  - Discussed post-LN2 care and natural hx of PIH and expected time course for improvement    2. Nevi and Lentigines  - Patient reassured regarding benign nature of the lesion based on TODAY's exam  - Patient counseled that lesions can change over time and to RTC if lesion grows rapidly, bleeds, develops multiple colors, or becomes painful as this may represent malignant change  - ABCDE's of melanoma reviewed with patient  - Sunprotection reviewed with patient    3. Inflamed EIC - resolving - left buttock  - schedule with derm surg for removal once not inflamed    - importance of photoprotection was reviewed including wearing broad spectrum sunscreen or sunblock, physical barrier protection such as wide brimmed hats and long sleeved shirts, and avoiding peak sun exposure from 10am to 4pm    Return to Clinic: 1 year, or sooner pending new/changing lesions  Discussed plan with patient and/or primary caretaker.  Patient to call clinic with any questions / concerns. Reviewed side effects of treatment(s) and/or medication(s) with patient and/or primary caretaker.  Handout provided: AVS    Vernona Rieger L. Allissa Albright

## 2017-06-08 ENCOUNTER — Other Ambulatory Visit: Payer: Self-pay | Admitting: Physician Assistant

## 2017-06-08 DIAGNOSIS — F339 Major depressive disorder, recurrent, unspecified: Secondary | ICD-10-CM

## 2017-06-16 ENCOUNTER — Other Ambulatory Visit: Payer: Self-pay | Admitting: *Deleted

## 2017-06-16 DIAGNOSIS — R600 Localized edema: Secondary | ICD-10-CM

## 2017-06-16 MED ORDER — BUTALBITAL-APAP-CAFF-COD 50-325-40-30 MG PO CAPS: ORAL_CAPSULE | ORAL | 1 refills | Status: DC

## 2017-06-16 MED ORDER — FUROSEMIDE 20 MG PO TABS: 20.0000 mg | ORAL_TABLET | Freq: Every day | ORAL | 1 refills | Status: DC

## 2017-06-24 ENCOUNTER — Telehealth: Payer: Self-pay | Admitting: Physician Assistant

## 2017-06-24 NOTE — Telephone Encounter (Signed)
Received fax from [facility] that Saxenda was approved from 05/25/2017 through 10/22/2017. Pharmacy notified and forms sent to scan.   Reference ID: [id number].

## 2017-06-25 ENCOUNTER — Ambulatory Visit (INDEPENDENT_AMBULATORY_CARE_PROVIDER_SITE_OTHER): Payer: BLUE CROSS/BLUE SHIELD | Admitting: Physician Assistant

## 2017-06-25 ENCOUNTER — Encounter: Payer: Self-pay | Admitting: Physician Assistant

## 2017-06-25 VITALS — BP 137/78 | HR 73 | Ht 64.0 in | Wt 329.0 lb

## 2017-06-25 DIAGNOSIS — L089 Local infection of the skin and subcutaneous tissue, unspecified: Secondary | ICD-10-CM | POA: Diagnosis not present

## 2017-06-25 DIAGNOSIS — R05 Cough: Secondary | ICD-10-CM

## 2017-06-25 DIAGNOSIS — R059 Cough, unspecified: Secondary | ICD-10-CM

## 2017-06-25 DIAGNOSIS — L4050 Arthropathic psoriasis, unspecified: Secondary | ICD-10-CM

## 2017-06-25 DIAGNOSIS — J209 Acute bronchitis, unspecified: Secondary | ICD-10-CM

## 2017-06-25 DIAGNOSIS — L409 Psoriasis, unspecified: Secondary | ICD-10-CM

## 2017-06-25 MED ORDER — DOXYCYCLINE HYCLATE 100 MG PO TABS: 100.0000 mg | ORAL_TABLET | Freq: Two times a day (BID) | ORAL | 0 refills | Status: DC

## 2017-06-25 MED ORDER — KETOCONAZOLE 2 % EX CREA: 1.0000 "application " | TOPICAL_CREAM | Freq: Two times a day (BID) | CUTANEOUS | 1 refills | Status: DC

## 2017-06-25 MED ORDER — ALBUTEROL SULFATE HFA 108 (90 BASE) MCG/ACT IN AERS: 2.0000 | INHALATION_SPRAY | Freq: Four times a day (QID) | RESPIRATORY_TRACT | 2 refills | Status: DC | PRN

## 2017-06-25 MED ORDER — PREDNISONE 20 MG PO TABS: ORAL_TABLET | ORAL | 0 refills | Status: DC

## 2017-06-25 NOTE — Progress Notes (Signed)
Subjective:    Patient ID: Sarah Mayer, female    DOB: 1970-11-25, 47 y.o.   MRN: [id number]  HPI  Pt is a 47 yo obese female with psoriatic arthritis, psoriasis who presents to the clinic with cough, SOB, chest tightness and bilateral feet swollen, warm and tender.   She has been switched to simphoni as new biologic. She had one really good day but overall not notice and significant relief. She cannot stand for longer than 3 minutes. She cannot walk more than 50 ft without resting. She needs rx for power wheel chair.   She is concerned with her cough. It is productive. She feels tired and weak. Her chest does feel tight. No fever. She has had hot flashes.   Her feet are painful and itchy. She denies any known trauma. She did wear her flip flops in someon yard over the weekend. She thought is was psoriasis and been putting steroid cream on them. They seem to be getting worse.   .. Active Ambulatory Problems    Diagnosis Date Noted  . Psoriatic arthritis (HCC) 03/24/2016  . Gastroesophageal reflux disease 03/24/2016  . GAD (generalized anxiety disorder) 03/24/2016  . Iron deficiency anemia 03/24/2016  . History of pulmonary embolism 03/27/2016  . History of DVT (deep vein thrombosis) 03/27/2016  . Chronic tension-type headache, not intractable 03/27/2016  . Primary insomnia 03/27/2016  . Bilateral lower extremity edema 03/27/2016  . Chronic pain syndrome 05/25/2016  . Morbidly obese (HCC) 05/27/2016  . Serum calcium elevated 08/30/2016  . Diarrhea 08/30/2016  . Hyperparathyroidism, primary (HCC) 09/18/2016  . Collagenous colitis 10/18/2016  . Barrett's esophagus without dysplasia 10/18/2016  . Arthralgia 10/18/2016  . Rash and nonspecific skin eruption 10/18/2016  . Memory changes 11/23/2016  . Major depression, recurrent, chronic (HCC) 11/23/2016  . OSA (obstructive sleep apnea) 11/23/2016  . Twitching 12/22/2016  . Cough 12/22/2016  . Bilateral hip pain 12/22/2016  .  Tripping over things 01/12/2017  . Left arm pain 01/12/2017  . Left elbow pain 01/12/2017  . Numbness and tingling in left arm 01/17/2017  . Word finding difficulty 03/15/2017  . Intractable migraine without aura and with status migrainosus 03/15/2017   Resolved Ambulatory Problems    Diagnosis Date Noted  . No Resolved Ambulatory Problems   Past Medical History:  Diagnosis Date  . Chronic pain syndrome 05/25/2016  . Gastroesophageal reflux disease 03/24/2016  . History of DVT (deep vein thrombosis) 03/27/2016  . History of pulmonary embolism 03/27/2016  . IBS (irritable bowel syndrome)   . Microscopic colitis   . Migraine   . Morbid obesity (HCC) 05/27/2016  . Psoriatic arthritis (HCC)      Review of Systems See HPI.     Objective:   Physical Exam  Constitutional: She is oriented to person, place, and time. She appears well-developed and well-nourished.  Obese.   HENT:  Head: Normocephalic and atraumatic.  Right Ear: External ear normal.  Left Ear: External ear normal.  Nose: Nose normal.  Mouth/Throat: Oropharynx is clear and moist.  Eyes: Pupils are equal, round, and reactive to light. Conjunctivae and EOM are normal.  Neck: Normal range of motion. Neck supple.  Cardiovascular: Normal rate and regular rhythm.  Pulmonary/Chest: Effort normal and breath sounds normal. She has no wheezes. She exhibits tenderness.  Lymphadenopathy:    She has no cervical adenopathy.  Neurological: She is alert and oriented to person, place, and time.  Psychiatric: She has a normal mood and affect. Her behavior  is normal.          Assessment & Plan:  Marland KitchenMarland KitchenShawnia was seen today for cough.  Diagnoses and all orders for this visit:  Acute bronchitis, unspecified organism -     doxycycline (VIBRA-TABS) 100 MG tablet; Take 1 tablet (100 mg total) by mouth 2 (two) times daily. -     predniSONE (DELTASONE) 20 MG tablet; Take 3 tablets for 3 days, take 2 tablets for 3 days, take 1 tablet for 3  days, take 1/2 tablet for 4 days. -     albuterol (PROVENTIL HFA;VENTOLIN HFA) 108 (90 Base) MCG/ACT inhaler; Inhale 2 puffs into the lungs every 6 (six) hours as needed for wheezing or shortness of breath.  Skin infection -     ketoconazole (NIZORAL) 2 % cream; Apply 1 application topically 2 (two) times daily. To affected areas.  Psoriatic arthritis (HCC)  Cough -     albuterol (PROVENTIL HFA;VENTOLIN HFA) 108 (90 Base) MCG/ACT inhaler; Inhale 2 puffs into the lungs every 6 (six) hours as needed for wheezing or shortness of breath.  Psoriasis  Other orders -     AMBULATORY NON FORMULARY MEDICATION; Power wheelchair due to not being able to ambulate.  treated bronchitis and bilateral foot infection with doxycycline. Albuterol inhaler for cough.  I do think there could be a fungal element to the feet. Stop topical steroid. Start antifungal cream.  Pt would be a great candidate for power wheelchair due to her inability to ambulate.   Follow up in one week.

## 2017-06-28 ENCOUNTER — Encounter: Payer: Self-pay | Admitting: Physician Assistant

## 2017-06-28 ENCOUNTER — Ambulatory Visit: Payer: BLUE CROSS/BLUE SHIELD | Admitting: Physician Assistant

## 2017-06-28 MED ORDER — AMBULATORY NON FORMULARY MEDICATION
0 refills | Status: DC
Start: 2017-06-28 — End: 2021-01-01

## 2017-06-29 DIAGNOSIS — R52 Pain, unspecified: Secondary | ICD-10-CM | POA: Diagnosis not present

## 2017-07-02 ENCOUNTER — Encounter: Payer: Self-pay | Admitting: Physician Assistant

## 2017-07-02 ENCOUNTER — Ambulatory Visit (INDEPENDENT_AMBULATORY_CARE_PROVIDER_SITE_OTHER): Payer: BLUE CROSS/BLUE SHIELD | Admitting: Physician Assistant

## 2017-07-02 VITALS — BP 96/83 | HR 84 | Temp 98.6°F | Wt 327.9 lb

## 2017-07-02 DIAGNOSIS — E559 Vitamin D deficiency, unspecified: Secondary | ICD-10-CM | POA: Diagnosis not present

## 2017-07-02 DIAGNOSIS — E211 Secondary hyperparathyroidism, not elsewhere classified: Secondary | ICD-10-CM | POA: Diagnosis not present

## 2017-07-02 DIAGNOSIS — E039 Hypothyroidism, unspecified: Secondary | ICD-10-CM

## 2017-07-02 DIAGNOSIS — E538 Deficiency of other specified B group vitamins: Secondary | ICD-10-CM | POA: Diagnosis not present

## 2017-07-02 DIAGNOSIS — R600 Localized edema: Secondary | ICD-10-CM

## 2017-07-02 MED ORDER — ONDANSETRON 8 MG PO TBDP: 8.0000 mg | ORAL_TABLET | Freq: Three times a day (TID) | ORAL | 3 refills | Status: DC | PRN

## 2017-07-02 NOTE — Progress Notes (Signed)
Subjective:    Patient ID: Sarah Mayer, female    DOB: [birth year], 47 y.o.   MRN: [id number]  HPI  Pt is a 47 yo morbidly obese female with extensive comorbidies who presents to the clinic to follow up on bilateral foot swelling and pain. She was given doxycycline and has 3 days left. She remains on lasix  daily. She is using ketoconazole cream for last 7 days. Her feet are better but still remain painful and have not healed completely.   She does feel like her cough, SOB, and chest tightness has improved. .  She sees endocrinology and placed on vitamin D for secondary hyperparathyroidism.   .. Active Ambulatory Problems    Diagnosis Date Noted  . Psoriatic arthritis (HCC) 03/24/2016  . Gastroesophageal reflux disease 03/24/2016  . GAD (generalized anxiety disorder) 03/24/2016  . Iron deficiency anemia 03/24/2016  . History of pulmonary embolism 03/27/2016  . History of DVT (deep vein thrombosis) 03/27/2016  . Chronic tension-type headache, not intractable 03/27/2016  . Primary insomnia 03/27/2016  . Bilateral lower extremity edema 03/27/2016  . Chronic pain syndrome 05/25/2016  . Morbidly obese (HCC) 05/27/2016  . Serum calcium elevated 08/30/2016  . Diarrhea 08/30/2016  . Hyperparathyroidism, primary (HCC) 09/18/2016  . Collagenous colitis 10/18/2016  . Barrett's esophagus without dysplasia 10/18/2016  . Arthralgia 10/18/2016  . Rash and nonspecific skin eruption 10/18/2016  . Memory changes 11/23/2016  . Major depression, recurrent, chronic (HCC) 11/23/2016  . OSA (obstructive sleep apnea) 11/23/2016  . Twitching 12/22/2016  . Cough 12/22/2016  . Bilateral hip pain 12/22/2016  . Tripping over things 01/12/2017  . Left arm pain 01/12/2017  . Left elbow pain 01/12/2017  . Numbness and tingling in left arm 01/17/2017  . Word finding difficulty 03/15/2017  . Intractable migraine without aura and with status migrainosus 03/15/2017  . Edema of both feet 07/04/2017   . B12 deficiency 07/04/2017  . Acquired hypothyroidism 07/04/2017  . Vitamin D deficiency 07/04/2017  . Secondary hyperparathyroidism, non-renal (HCC) 07/04/2017   Resolved Ambulatory Problems    Diagnosis Date Noted  . No Resolved Ambulatory Problems   Past Medical History:  Diagnosis Date  . Chronic pain syndrome 05/25/2016  . Gastroesophageal reflux disease 03/24/2016  . History of DVT (deep vein thrombosis) 03/27/2016  . History of pulmonary embolism 03/27/2016  . IBS (irritable bowel syndrome)   . Microscopic colitis   . Migraine   . Morbid obesity (HCC) 05/27/2016  . Psoriatic arthritis (HCC)         Review of Systems  All other systems reviewed and are negative.      Objective:   Physical Exam  Constitutional: She is oriented to person, place, and time. She appears well-developed and well-nourished.  HENT:  Head: Normocephalic and atraumatic.  Neck: Normal range of motion. Neck supple. No thyromegaly present.  Cardiovascular: Normal rate and regular rhythm.  Pulmonary/Chest: Effort normal and breath sounds normal.  Neurological: She is alert and oriented to person, place, and time.  Skin:  Bilateral foot edema 1+. Healing sore with scabs over bilateral dorsal feet. Some redness but no warmth or discharge.   Psychiatric: She has a normal mood and affect. Her behavior is normal.          Assessment & Plan:  Marland KitchenMarland KitchenRayhana was seen today for follow-up.  Diagnoses and all orders for this visit:  Edema of both feet  Secondary hyperparathyroidism, non-renal (HCC) -     PTH, Intact and Calcium  Vitamin  D deficiency -     Vitamin D 1,25 dihydroxy  Acquired hypothyroidism -     TSH -     T4, free -     T3  B12 deficiency -     B12 and Folate Panel  Other orders -     ondansetron (ZOFRAN-ODT) 8 MG disintegrating tablet; Take 1 tablet (8 mg total) by mouth every 8 (eight) hours as needed for nausea.   Feet do appear to be improving. Continue lasix. Finish  doxycycline. Placed in unna boots today. I think the compression will allow feet to heal more quickly. Continue ketoconazole after unna boots come off in 5 days. Follow up in 1 week.   Recheck labs. Will forward to endocrinology.

## 2017-07-04 DIAGNOSIS — R600 Localized edema: Secondary | ICD-10-CM | POA: Insufficient documentation

## 2017-07-04 DIAGNOSIS — E538 Deficiency of other specified B group vitamins: Secondary | ICD-10-CM | POA: Insufficient documentation

## 2017-07-04 DIAGNOSIS — E039 Hypothyroidism, unspecified: Secondary | ICD-10-CM | POA: Insufficient documentation

## 2017-07-04 DIAGNOSIS — E559 Vitamin D deficiency, unspecified: Secondary | ICD-10-CM | POA: Insufficient documentation

## 2017-07-04 DIAGNOSIS — E211 Secondary hyperparathyroidism, not elsewhere classified: Secondary | ICD-10-CM | POA: Insufficient documentation

## 2017-07-06 ENCOUNTER — Ambulatory Visit (INDEPENDENT_AMBULATORY_CARE_PROVIDER_SITE_OTHER): Payer: BLUE CROSS/BLUE SHIELD | Admitting: Physician Assistant

## 2017-07-06 ENCOUNTER — Encounter: Payer: Self-pay | Admitting: Physician Assistant

## 2017-07-06 ENCOUNTER — Telehealth: Payer: Self-pay | Admitting: Physician Assistant

## 2017-07-06 VITALS — BP 124/81 | HR 75

## 2017-07-06 DIAGNOSIS — G43011 Migraine without aura, intractable, with status migrainosus: Secondary | ICD-10-CM

## 2017-07-06 DIAGNOSIS — R600 Localized edema: Secondary | ICD-10-CM

## 2017-07-06 DIAGNOSIS — K52831 Collagenous colitis: Secondary | ICD-10-CM | POA: Diagnosis not present

## 2017-07-06 DIAGNOSIS — G9332 Myalgic encephalomyelitis/chronic fatigue syndrome: Secondary | ICD-10-CM | POA: Insufficient documentation

## 2017-07-06 DIAGNOSIS — R5382 Chronic fatigue, unspecified: Secondary | ICD-10-CM | POA: Diagnosis not present

## 2017-07-06 DIAGNOSIS — G894 Chronic pain syndrome: Secondary | ICD-10-CM

## 2017-07-06 DIAGNOSIS — L409 Psoriasis, unspecified: Secondary | ICD-10-CM | POA: Insufficient documentation

## 2017-07-06 MED ORDER — FLUOCINONIDE 0.05 % EX CREA: TOPICAL_CREAM | Freq: Two times a day (BID) | CUTANEOUS | 2 refills | Status: DC

## 2017-07-06 MED ORDER — GALCANEZUMAB-GNLM 120 MG/ML ~~LOC~~ SOAJ: 120.0000 mg | SUBCUTANEOUS | 5 refills | Status: DC

## 2017-07-06 MED ORDER — DIAZEPAM 5 MG PO TABS: ORAL_TABLET | ORAL | 0 refills | Status: DC

## 2017-07-06 MED ORDER — HYOSCYAMINE SULFATE ER 0.375 MG PO TB12: 0.3750 mg | ORAL_TABLET | Freq: Two times a day (BID) | ORAL | 2 refills | Status: DC

## 2017-07-06 MED ORDER — MODAFINIL 100 MG PO TABS: 100.0000 mg | ORAL_TABLET | Freq: Every day | ORAL | 2 refills | Status: DC

## 2017-07-06 NOTE — Progress Notes (Deleted)
28th started

## 2017-07-06 NOTE — Telephone Encounter (Signed)
Sent!

## 2017-07-06 NOTE — Progress Notes (Signed)
Subjective:    Patient ID: Sarah Mayer, female    DOB: April 09, 1970, 47 y.o.   MRN: [id number]  HPI  47 year old female presents for follow-up of multiple complaints:  Psoriatic rash on feet- she continues to have the rash on her feet, however it has gotten better since her visit last week. She left the unna boots on for about 24 hours and then took them off due to pain. Her biggest complaint with them now is the pain. She cannot stand due to the pain. She has not been using the ketoconazole cream. The rash is not itchy.  Migraines- she continues to have migraines despite taking gabapentin daily. She comes in to get first dose of emgality.   Fatigue- she continues to have fatigue. She explains that she had one 6-hour period several weeks ago after receiving an infusion for her psoriatic arthritis where she did not feel as fatigued and was able to leave her house and go to the shoe store with her mother, although she was still in pain during that time. She does not normally have this kind of energy. She is not able to exercise lately. Her physical therapist recommended exercising/swimming in the pool for activity, however she has not been able to gain access to a pool.  Colitis- she explains that whenever she eats lettuce or spinach she will have loose stools for several days and stomach cramps. She has tried taking Immodium during these episodes and it has not helped.   .. Active Ambulatory Problems    Diagnosis Date Noted  . Psoriatic arthritis (HCC) 03/24/2016  . Gastroesophageal reflux disease 03/24/2016  . GAD (generalized anxiety disorder) 03/24/2016  . Iron deficiency anemia 03/24/2016  . History of pulmonary embolism 03/27/2016  . History of DVT (deep vein thrombosis) 03/27/2016  . Chronic tension-type headache, not intractable 03/27/2016  . Primary insomnia 03/27/2016  . Bilateral lower extremity edema 03/27/2016  . Chronic pain syndrome 05/25/2016  . Morbidly obese (HCC)  05/27/2016  . Serum calcium elevated 08/30/2016  . Diarrhea 08/30/2016  . Hyperparathyroidism, primary (HCC) 09/18/2016  . Collagenous colitis 10/18/2016  . Barrett's esophagus without dysplasia 10/18/2016  . Arthralgia 10/18/2016  . Rash and nonspecific skin eruption 10/18/2016  . Memory changes 11/23/2016  . Major depression, recurrent, chronic (HCC) 11/23/2016  . OSA (obstructive sleep apnea) 11/23/2016  . Twitching 12/22/2016  . Cough 12/22/2016  . Bilateral hip pain 12/22/2016  . Tripping over things 01/12/2017  . Left arm pain 01/12/2017  . Left elbow pain 01/12/2017  . Numbness and tingling in left arm 01/17/2017  . Word finding difficulty 03/15/2017  . Intractable migraine without aura and with status migrainosus 03/15/2017  . Edema of both feet 07/04/2017  . B12 deficiency 07/04/2017  . Acquired hypothyroidism 07/04/2017  . Vitamin D deficiency 07/04/2017  . Secondary hyperparathyroidism, non-renal (HCC) 07/04/2017   Resolved Ambulatory Problems    Diagnosis Date Noted  . No Resolved Ambulatory Problems   Past Medical History:  Diagnosis Date  . Chronic pain syndrome 05/25/2016  . Gastroesophageal reflux disease 03/24/2016  . History of DVT (deep vein thrombosis) 03/27/2016  . History of pulmonary embolism 03/27/2016  . IBS (irritable bowel syndrome)   . Microscopic colitis   . Migraine   . Morbid obesity (HCC) 05/27/2016  . Psoriatic arthritis (HCC)      Review of Systems  All other systems reviewed and are negative.      Objective:   Physical Exam  Constitutional: She is oriented  to person, place, and time. She appears well-developed and well-nourished.  obese  HENT:  Head: Normocephalic and atraumatic.  Eyes: Pupils are equal, round, and reactive to light. EOM are normal.  Cardiovascular: Normal rate, regular rhythm and normal heart sounds.  Pulmonary/Chest: Effort normal and breath sounds normal.  Neurological: She is alert and oriented to person, place,  and time.  Skin: Skin is warm and dry. Rash noted.          Assessment & Plan:  Marland KitchenMarland KitchenDiagnoses and all orders for this visit:  Edema of both feet  Chronic fatigue -     modafinil (PROVIGIL) 100 MG tablet; Take 1 tablet (100 mg total) by mouth daily.  Intractable migraine without aura and with status migrainosus -     Galcanezumab-gnlm (EMGALITY) 120 MG/ML SOAJ; Inject 120 mg into the skin every 30 (thirty) days.  Chronic pain syndrome  Collagenous colitis -     hyoscyamine (LEVBID) 0.375 MG 12 hr tablet; Take 1 tablet (0.375 mg total) by mouth 2 (two) times daily.  Psoriasis -     fluocinonide cream (LIDEX) 0.05 %; Apply topically 2 (two) times daily.   Feet look better. Increase lasix to get the remainder of the swelling down. Continue with compression and elevation. Continue with desitin and ketoconazole. Follow up in 4 weeks. Likely this is going to take time. Get your feet in the sun.   For IBS like symptoms given levbid to try. Follow up in 4 weeks.   For migraines started emgality. Discussed how to use and side effects. Follow up in 1 month.   For chronic fatigue discussed many causes such as her weight, medications, depression. She is falling asleep all the time. Will try provigil. Follow up in 1 month.   [name].Spent 40 minutes with patient and greater than 50 percent of visit spent counseling patient regarding treatment plan.

## 2017-07-06 NOTE — Telephone Encounter (Signed)
Pt needs pre-meds for claustrophobia before MRI. Routing.

## 2017-07-06 NOTE — Patient Instructions (Addendum)
Increase lasix  twice a day. Continue with compression and elevation. Continue with destin and ketoconazole.

## 2017-07-07 NOTE — Telephone Encounter (Signed)
Pt advised.

## 2017-07-09 ENCOUNTER — Telehealth: Payer: Self-pay | Admitting: Physician Assistant

## 2017-07-09 NOTE — Telephone Encounter (Signed)
Received fax from [facility] that Emgality was approved from 07/09/2017 through 10/09/2017. Pharmacy notified and forms sent to scan.   Reference ID: [id number].

## 2017-07-10 ENCOUNTER — Ambulatory Visit (HOSPITAL_BASED_OUTPATIENT_CLINIC_OR_DEPARTMENT_OTHER)
Admission: RE | Admit: 2017-07-10 | Discharge: 2017-07-10 | Disposition: A | Discharge status: Home / Self Care | Payer: BLUE CROSS/BLUE SHIELD | Source: Ambulatory Visit | Attending: Physician Assistant | Admitting: Physician Assistant

## 2017-07-10 DIAGNOSIS — G44229 Chronic tension-type headache, not intractable: Secondary | ICD-10-CM | POA: Diagnosis not present

## 2017-07-10 MED ORDER — GADOBENATE DIMEGLUMINE 529 MG/ML IV SOLN
20.0000 mL | Freq: Once | INTRAVENOUS | Status: AC | PRN
  Administered 2017-07-10: 20 mL via INTRAVENOUS

## 2017-07-11 NOTE — Progress Notes (Signed)
Great news MRI is normal.

## 2017-07-12 ENCOUNTER — Encounter: Payer: Self-pay | Admitting: Physician Assistant

## 2017-07-16 DIAGNOSIS — L4050 Arthropathic psoriasis, unspecified: Secondary | ICD-10-CM | POA: Diagnosis not present

## 2017-07-16 DIAGNOSIS — M25569 Pain in unspecified knee: Secondary | ICD-10-CM | POA: Diagnosis not present

## 2017-07-19 ENCOUNTER — Encounter: Payer: Self-pay | Admitting: Physician Assistant

## 2017-07-19 DIAGNOSIS — R21 Rash and other nonspecific skin eruption: Secondary | ICD-10-CM

## 2017-07-20 MED ORDER — HYDROXYZINE HCL 50 MG PO TABS
50.0000 mg | ORAL_TABLET | Freq: Every evening | ORAL | 1 refills | Status: DC | PRN
Start: 2017-07-20 — End: 2017-12-21

## 2017-07-22 ENCOUNTER — Other Ambulatory Visit: Payer: Self-pay | Admitting: Physician Assistant

## 2017-07-22 DIAGNOSIS — G43011 Migraine without aura, intractable, with status migrainosus: Secondary | ICD-10-CM

## 2017-07-23 ENCOUNTER — Ambulatory Visit (INDEPENDENT_AMBULATORY_CARE_PROVIDER_SITE_OTHER): Payer: BLUE CROSS/BLUE SHIELD | Admitting: Physician Assistant

## 2017-07-23 ENCOUNTER — Encounter: Payer: Self-pay | Admitting: Physician Assistant

## 2017-07-23 VITALS — BP 140/83 | HR 94

## 2017-07-23 DIAGNOSIS — T148XXA Other injury of unspecified body region, initial encounter: Secondary | ICD-10-CM

## 2017-07-23 DIAGNOSIS — R42 Dizziness and giddiness: Secondary | ICD-10-CM

## 2017-07-23 DIAGNOSIS — R300 Dysuria: Secondary | ICD-10-CM

## 2017-07-23 DIAGNOSIS — R233 Spontaneous ecchymoses: Secondary | ICD-10-CM

## 2017-07-23 DIAGNOSIS — L299 Pruritus, unspecified: Secondary | ICD-10-CM | POA: Diagnosis not present

## 2017-07-23 DIAGNOSIS — K2100 Gastro-esophageal reflux disease with esophagitis, without bleeding: Secondary | ICD-10-CM

## 2017-07-23 DIAGNOSIS — L089 Local infection of the skin and subcutaneous tissue, unspecified: Secondary | ICD-10-CM

## 2017-07-23 DIAGNOSIS — N3000 Acute cystitis without hematuria: Secondary | ICD-10-CM | POA: Diagnosis not present

## 2017-07-23 DIAGNOSIS — R238 Other skin changes: Secondary | ICD-10-CM

## 2017-07-23 LAB — POCT URINALYSIS DIPSTICK
BILIRUBIN UA: NEGATIVE
Blood, UA: NEGATIVE
Glucose, UA: NEGATIVE
Ketones, UA: NEGATIVE
Nitrite, UA: NEGATIVE
PH UA: 5.5 (ref 5.0–8.0)
Protein, UA: NEGATIVE
Spec Grav, UA: 1.015 (ref 1.010–1.025)
Urobilinogen, UA: 0.2 E.U./dL

## 2017-07-23 LAB — COMPLETE METABOLIC PANEL WITH GFR
AG Ratio: 1.8 (calc) (ref 1.0–2.5)
ALBUMIN MSPROF: 4.4 g/dL (ref 3.6–5.1)
ALT: 22 U/L (ref 6–29)
AST: 16 U/L (ref 10–35)
Alkaline phosphatase (APISO): 145 U/L — ABNORMAL HIGH (ref 33–115)
BUN: 11 mg/dL (ref 7–25)
CALCIUM: 9.8 mg/dL (ref 8.6–10.2)
CO2: 25 mmol/L (ref 20–32)
Chloride: 103 mmol/L (ref 98–110)
Creat: 0.8 mg/dL (ref 0.50–1.10)
GFR, EST NON AFRICAN AMERICAN: 88 mL/min/{1.73_m2} (ref >=60)
GFR, Est African American: 102 mL/min/{1.73_m2} (ref >=60)
GLOBULIN: 2.4 g/dL (ref 1.9–3.7)
Glucose, Bld: 89 mg/dL (ref 65–99)
POTASSIUM: 4.7 mmol/L (ref 3.5–5.3)
SODIUM: 138 mmol/L (ref 135–146)
Total Bilirubin: 0.4 mg/dL (ref 0.2–1.2)
Total Protein: 6.8 g/dL (ref 6.1–8.1)

## 2017-07-23 MED ORDER — SUCRALFATE 1 G PO TABS: 1.0000 g | ORAL_TABLET | Freq: Four times a day (QID) | ORAL | 0 refills | Status: DC

## 2017-07-23 MED ORDER — DOXYCYCLINE HYCLATE 100 MG PO TABS: 100.0000 mg | ORAL_TABLET | Freq: Two times a day (BID) | ORAL | 0 refills | Status: DC

## 2017-07-23 NOTE — Patient Instructions (Addendum)
3-5 days change duoderm. [name]

## 2017-07-23 NOTE — Progress Notes (Signed)
Subjective:    Patient ID: Sarah Mayer, female    DOB: 30-Apr-1970, 47 y.o.   MRN: [id number]  HPI  Patient is a 47 year old pleasant female who presents to the clinic to follow-up after an episode of easy bruising, itching, rash over the weekend.  She was seen by rheumatology and given her second dose of the biological agent Symphony.  The original injection she did very well.  The next day she started having itching on her left knee that began to spread over both legs bilaterally with bruising and itching.  She was sent over hydroxyzine which helped with the itching and now most symptoms are resolved.  She does have still have some residual bruising.  She denies any actual site reaction.  Patient currently on Zantac and AcipHex for GERD.  She still has occasional times where she needs something a little extra.  She wonders if she could have something for this.  Usually this is when she eats something overtly spicy or acidic.  Overall Zantac and AcipHex control her symptoms.  Patient's wound on bilateral feet as well as swelling has improved significantly.  She still does have some surrounding redness and open wound on her left dorsal foot.  She is concerned because she is losing her insurance this week and does not know when she can come back for follow-up.  She denies any fever or chills.  She does feel a little dizzy mostly when she stands.  She also has some dysuria.  She wonders if she could have urinary tract infection.  She is taken some cranberry tablets which seem to have helped some.  .. Active Ambulatory Problems    Diagnosis Date Noted  . Psoriatic arthritis (HCC) 03/24/2016  . Gastroesophageal reflux disease 03/24/2016  . GAD (generalized anxiety disorder) 03/24/2016  . Iron deficiency anemia 03/24/2016  . History of pulmonary embolism 03/27/2016  . History of DVT (deep vein thrombosis) 03/27/2016  . Chronic tension-type headache, not intractable 03/27/2016  . Primary  insomnia 03/27/2016  . Bilateral lower extremity edema 03/27/2016  . Chronic pain syndrome 05/25/2016  . Morbidly obese (HCC) 05/27/2016  . Serum calcium elevated 08/30/2016  . Diarrhea 08/30/2016  . Hyperparathyroidism, primary (HCC) 09/18/2016  . Collagenous colitis 10/18/2016  . Barrett's esophagus without dysplasia 10/18/2016  . Arthralgia 10/18/2016  . Rash and nonspecific skin eruption 10/18/2016  . Memory changes 11/23/2016  . Major depression, recurrent, chronic (HCC) 11/23/2016  . OSA (obstructive sleep apnea) 11/23/2016  . Twitching 12/22/2016  . Cough 12/22/2016  . Bilateral hip pain 12/22/2016  . Tripping over things 01/12/2017  . Left arm pain 01/12/2017  . Left elbow pain 01/12/2017  . Numbness and tingling in left arm 01/17/2017  . Word finding difficulty 03/15/2017  . Intractable migraine without aura and with status migrainosus 03/15/2017  . Edema of both feet 07/04/2017  . B12 deficiency 07/04/2017  . Acquired hypothyroidism 07/04/2017  . Vitamin D deficiency 07/04/2017  . Secondary hyperparathyroidism, non-renal (HCC) 07/04/2017  . Psoriasis 07/06/2017  . Chronic fatigue 07/06/2017  . Easy bruising 07/26/2017   Resolved Ambulatory Problems    Diagnosis Date Noted  . No Resolved Ambulatory Problems   Past Medical History:  Diagnosis Date  . Chronic pain syndrome 05/25/2016  . Gastroesophageal reflux disease 03/24/2016  . History of DVT (deep vein thrombosis) 03/27/2016  . History of pulmonary embolism 03/27/2016  . IBS (irritable bowel syndrome)   . Microscopic colitis   . Migraine   . Morbid obesity (  HCC) 05/27/2016  . Psoriatic arthritis (HCC)      Review of Systems  All other systems reviewed and are negative.      Objective:   Physical Exam  Constitutional: She is oriented to person, place, and time. She appears well-developed and well-nourished.  Obese.   HENT:  Head: Normocephalic and atraumatic.  Neck: Normal range of motion. Neck supple.   Cardiovascular: Normal rate and regular rhythm.  Pulmonary/Chest: Effort normal and breath sounds normal.  No CVA tenderness.   Abdominal: Soft. Bowel sounds are normal. She exhibits no distension and no mass. There is no tenderness. There is no rebound and no guarding. No hernia.  Neurological: She is alert and oriented to person, place, and time.  Skin:  Left dorsal foot- two open ulcer like wounds with no warmth but some erythema.   Scant bilateral edema of feet and ankles.   Diffuse bruising over bilateral legs.   Psychiatric: She has a normal mood and affect. Her behavior is normal.          Assessment & Plan:  [name].[name].Diagnoses and all orders for this visit:  Easy bruising -     CBC w/Diff/Platelet  Itching -     COMPLETE METABOLIC PANEL WITH GFR  Gastroesophageal reflux disease with esophagitis -     sucralfate (CARAFATE) 1 g tablet; Take 1 tablet (1 g total) by mouth 4 (four) times daily.  Wound infection -     doxycycline (VIBRA-TABS) 100 MG tablet; Take 1 tablet (100 mg total) by mouth 2 (two) times daily.  Dysuria -     POCT urinalysis dipstick -     Urine Culture -     nitrofurantoin, macrocrystal-monohydrate, (MACROBID) 100 MG capsule; Take 1 capsule (100 mg total) by mouth 2 (two) times daily.  Dizziness  Acute cystitis without hematuria -     nitrofurantoin, macrocrystal-monohydrate, (MACROBID) 100 MG capsule; Take 1 capsule (100 mg total) by mouth 2 (two) times daily.   .. Results for orders placed or performed in visit on 07/23/17  Urine Culture  Result Value Ref Range   MICRO NUMBER: [id number]    SPECIMEN QUALITY: ADEQUATE    Sample Source NOT GIVEN    STATUS: FINAL    ISOLATE 1: Escherichia coli (A)       Susceptibility   Escherichia coli - URINE CULTURE, REFLEX    AMOX/CLAVULANIC <=2 Sensitive     AMPICILLIN 8 Sensitive     AMPICILLIN/SULBACTAM 4 Sensitive     CEFAZOLIN* <=4 Not Reportable      * For infections other than uncomplicated  UTIcaused by E. coli, K. pneumoniae or P. mirabilis:Cefazolin is resistant if MIC > or = 8 mcg/mL.(Distinguishing susceptible versus intermediatefor isolates with MIC < or = 4 mcg/mL requiresadditional testing.)For uncomplicated UTI caused by E. coli,K. pneumoniae or P. mirabilis: Cefazolin issusceptible if MIC <32 mcg/mL and predictssusceptible to the oral agents cefaclor, cefdinir,cefpodoxime, cefprozil, cefuroxime, cephalexinand loracarbef.    CEFEPIME <=1 Sensitive     CEFTRIAXONE <=1 Sensitive     CIPROFLOXACIN <=0.25 Sensitive     LEVOFLOXACIN <=0.12 Sensitive     ERTAPENEM <=0.5 Sensitive     GENTAMICIN <=1 Sensitive     IMIPENEM <=0.25 Sensitive     NITROFURANTOIN <=16 Sensitive     PIP/TAZO <=4 Sensitive     TOBRAMYCIN <=1 Sensitive     TRIMETH/SULFA* <=20 Sensitive      * For infections other than uncomplicated UTIcaused by E. coli, K. pneumoniae or P. mirabilis:Cefazolin  is resistant if MIC > or = 8 mcg/mL.(Distinguishing susceptible versus intermediatefor isolates with MIC < or = 4 mcg/mL requiresadditional testing.)For uncomplicated UTI caused by E. coli,K. pneumoniae or P. mirabilis: Cefazolin issusceptible if MIC <32 mcg/mL and predictssusceptible to the oral agents cefaclor, cefdinir,cefpodoxime, cefprozil, cefuroxime, cephalexinand loracarbef.Legend:S = Susceptible  I = IntermediateR = Resistant  NS = Not susceptible* = Not tested  NR = Not reported**NN = See antimicrobic comments  CBC w/Diff/Platelet  Result Value Ref Range   WBC 7.8 3.8 - 10.8 Thousand/uL   RBC 5.43 (H) 3.80 - 5.10 Million/uL   Hemoglobin 15.2 11.7 - 15.5 g/dL   HCT 16.1 09.6 - 04.5 %   MCV 82.9 80.0 - 100.0 fL   MCH 28.0 27.0 - 33.0 pg   MCHC 33.8 32.0 - 36.0 g/dL   RDW 40.9 81.1 - 91.4 %   Platelets 374 140 - 400 Thousand/uL   MPV 11.4 7.5 - 12.5 fL   Neutro Abs 4,610 1,500 - 7,800 cells/uL   Lymphs Abs 2,324 850 - 3,900 cells/uL   WBC mixed population 655 200 - 950 cells/uL   Eosinophils Absolute 140  15 - 500 cells/uL   Basophils Absolute 70 0 - 200 cells/uL   Neutrophils Relative % 59.1 %   Total Lymphocyte 29.8 %   Monocytes Relative 8.4 %   Eosinophils Relative 1.8 %   Basophils Relative 0.9 %  COMPLETE METABOLIC PANEL WITH GFR  Result Value Ref Range   Glucose, Bld 89 65 - 99 mg/dL   BUN 11 7 - 25 mg/dL   Creat 7.82 9.56 - 2.13 mg/dL   GFR, Est Non African American 88 > OR = 60 mL/min/1.52m2   GFR, Est African American 102 > OR = 60 mL/min/1.8m2   BUN/Creatinine Ratio NOT APPLICABLE 6 - 22 (calc)   Sodium 138 135 - 146 mmol/L   Potassium 4.7 3.5 - 5.3 mmol/L   Chloride 103 98 - 110 mmol/L   CO2 25 20 - 32 mmol/L   Calcium 9.8 8.6 - 10.2 mg/dL   Total Protein 6.8 6.1 - 8.1 g/dL   Albumin 4.4 3.6 - 5.1 g/dL   Globulin 2.4 1.9 - 3.7 g/dL (calc)   AG Ratio 1.8 1.0 - 2.5 (calc)   Total Bilirubin 0.4 0.2 - 1.2 mg/dL   Alkaline phosphatase (APISO) 145 (H) 33 - 115 U/L   AST 16 10 - 35 U/L   ALT 22 6 - 29 U/L  POCT urinalysis dipstick  Result Value Ref Range   Color, UA light yellow    Clarity, UA clear    Glucose, UA Negative Negative   Bilirubin, UA neg    Ketones, UA neg    Spec Grav, UA 1.015 1.010 - 1.025   Blood, UA neg    pH, UA 5.5 5.0 - 8.0   Protein, UA Negative Negative   Urobilinogen, UA 0.2 0.2 or 1.0 E.U./dL   Nitrite, UA neg    Leukocytes, UA Trace (A) Negative   Appearance     Odor     Original dipstick showed trace leukocytes.  This had to do culture before treating.  Culture came back positive for E. coli.  Macrobid sent to pharmacy.  Follow-up as needed.  Stat CBC was ordered.  Platelets and hemoglobin look good.  No metabolic reason for easy bruising.  I do suggest that she follow-up with her rheumatologist this could be a reaction to her Symphony.  We did place some DuoDERM over  her wound and told her to keep on for 3 to 5 days and remove and replace.  I would like for her to follow-up in 3 to 4 weeks.  There is a chance she may lose some  insurance so I did print off a copy of doxycycline if she saw signs of infection such as warmth, redness, increasing swelling, fever, chills, body aches.  I did give patient some Carafate to add as needed to her AcipHex and Zantac.  Follow-up as needed.  It is likely that her increase in dizziness is due to dehydration from the sun this weekend or possible urinary tract infection.  Follow-up if this worsens.  Encourage patient to stay hydrated.  [name].Spent 30 minutes with patient and greater than 50 percent of visit spent counseling patient regarding treatment plan.

## 2017-07-24 LAB — CBC WITH DIFFERENTIAL/PLATELET
BASOS ABS: 70 {cells}/uL (ref 0–200)
Basophils Relative: 0.9 %
Eosinophils Absolute: 140 cells/uL (ref 15–500)
Eosinophils Relative: 1.8 %
HEMATOCRIT: 45 % (ref 35.0–45.0)
HEMOGLOBIN: 15.2 g/dL (ref 11.7–15.5)
LYMPHS ABS: 2324 {cells}/uL (ref 850–3900)
MCH: 28 pg (ref 27.0–33.0)
MCHC: 33.8 g/dL (ref 32.0–36.0)
MCV: 82.9 fL (ref 80.0–100.0)
MPV: 11.4 fL (ref 7.5–12.5)
Monocytes Relative: 8.4 %
NEUTROS ABS: 4610 {cells}/uL (ref 1500–7800)
NEUTROS PCT: 59.1 %
Platelets: 374 10*3/uL (ref 140–400)
RBC: 5.43 10*6/uL — ABNORMAL HIGH (ref 3.80–5.10)
RDW: 13.3 % (ref 11.0–15.0)
Total Lymphocyte: 29.8 %
WBC: 7.8 10*3/uL (ref 3.8–10.8)
WBCMIX: 655 {cells}/uL (ref 200–950)

## 2017-07-25 LAB — URINE CULTURE
MICRO NUMBER: 90659073
SPECIMEN QUALITY:: ADEQUATE

## 2017-07-26 ENCOUNTER — Encounter: Payer: Self-pay | Admitting: Physician Assistant

## 2017-07-26 DIAGNOSIS — R233 Spontaneous ecchymoses: Secondary | ICD-10-CM | POA: Insufficient documentation

## 2017-07-26 DIAGNOSIS — R238 Other skin changes: Secondary | ICD-10-CM | POA: Insufficient documentation

## 2017-07-26 MED ORDER — NITROFURANTOIN MONOHYD MACRO 100 MG PO CAPS: 100.0000 mg | ORAL_CAPSULE | Freq: Two times a day (BID) | ORAL | 0 refills | Status: DC

## 2017-07-26 NOTE — Progress Notes (Signed)
Call pt: urine culture grew e.coli therefore consistent with UTI.   Will send macrobid to pharmacy to treat. I do not see on med list.

## 2017-07-27 NOTE — Progress Notes (Signed)
Pt has seen results on MyChart and message also sent for patient to call back if any questions.

## 2017-07-31 ENCOUNTER — Encounter: Payer: Self-pay | Admitting: Physician Assistant

## 2017-08-03 ENCOUNTER — Encounter: Payer: Self-pay | Admitting: Physician Assistant

## 2017-08-15 ENCOUNTER — Other Ambulatory Visit: Payer: Self-pay | Admitting: Family Medicine

## 2017-08-15 ENCOUNTER — Other Ambulatory Visit: Payer: Self-pay | Admitting: Physician Assistant

## 2017-08-15 DIAGNOSIS — R600 Localized edema: Secondary | ICD-10-CM

## 2017-08-15 DIAGNOSIS — K2100 Gastro-esophageal reflux disease with esophagitis, without bleeding: Secondary | ICD-10-CM

## 2017-08-16 NOTE — Telephone Encounter (Signed)
Sent to pcp for review and signature.Toshiro Hanken Lynetta, CMA  

## 2017-08-17 ENCOUNTER — Encounter: Payer: Self-pay | Admitting: Physician Assistant

## 2017-08-17 ENCOUNTER — Ambulatory Visit (INDEPENDENT_AMBULATORY_CARE_PROVIDER_SITE_OTHER): Payer: PRIVATE HEALTH INSURANCE | Admitting: Physician Assistant

## 2017-08-17 VITALS — BP 102/47 | HR 102

## 2017-08-17 DIAGNOSIS — B379 Candidiasis, unspecified: Secondary | ICD-10-CM | POA: Diagnosis not present

## 2017-08-17 DIAGNOSIS — G44229 Chronic tension-type headache, not intractable: Secondary | ICD-10-CM | POA: Diagnosis not present

## 2017-08-17 DIAGNOSIS — F339 Major depressive disorder, recurrent, unspecified: Secondary | ICD-10-CM

## 2017-08-17 DIAGNOSIS — M7918 Myalgia, other site: Secondary | ICD-10-CM | POA: Diagnosis not present

## 2017-08-17 DIAGNOSIS — G894 Chronic pain syndrome: Secondary | ICD-10-CM

## 2017-08-17 DIAGNOSIS — R5382 Chronic fatigue, unspecified: Secondary | ICD-10-CM | POA: Diagnosis not present

## 2017-08-17 DIAGNOSIS — L299 Pruritus, unspecified: Secondary | ICD-10-CM

## 2017-08-17 MED ORDER — AMITRIPTYLINE HCL 50 MG PO TABS: 50.0000 mg | ORAL_TABLET | Freq: Every day | ORAL | 1 refills | Status: DC

## 2017-08-17 MED ORDER — MODAFINIL 200 MG PO TABS: 200.0000 mg | ORAL_TABLET | Freq: Every day | ORAL | 1 refills | Status: DC

## 2017-08-17 MED ORDER — GABAPENTIN 600 MG PO TABS: ORAL_TABLET | ORAL | 1 refills | Status: DC

## 2017-08-17 NOTE — Progress Notes (Signed)
Subjective:    Patient ID: Sarah Mayer, female    DOB: Jul 04, 1970, 47 y.o.   MRN: [id number]  HPI Patient is a 47 yo female with a past medical history of chronic pain syndrome, migraines, hypothyroidism, and psoriatic arthritis presenting to the clinic today for itching.  She has had episodic itching for several years with worsening over the past couple months. She describes episodes of unbearable itching all over her body. She also has seasonal allergies for which she takes Careers adviserAllegra. She denies taking any specific medications for the period of time she has had itching.  She has diarrhea and nausea chronically but over the past year her nausea has worsened. She admits to abdominal pain after eating lettuce or spinach. She has a history of gastroparesis which she thinks contributes to these symptoms. She denies food allergies.  She describes an episode of severe pain following a 2.5 hour car ride to visit her brother. She had pain with movement and sitting. Around that same time she experienced intense burning/tingling pain of her bilateral hands following the use of a hand towel. Since then she admits to uncomfortable paresthesias of bilateral hands and forearms.   Due to changes in her insurance and worry over being able to see pain management and get her meds, she tapered her gabapentin in May. She is taking two-thirds of her prescribed dose. She has also stopped taking her Hysingla and takes her other pain medications only sporadically.  She admits daily migraines since mid May. She has tried several medications recently and historically without relief. She states the only thing that helps is trigger point injections. She also feels that her fatigue is not completely improved.  She has had increased vaginal discharge and itching starting this week. She believes she has a yeast infection given a recent course of antibiotics.  Review of Systems See HPI .[name]. Active Ambulatory Problems   Diagnosis Date Noted  . Psoriatic arthritis (HCC) 03/24/2016  . Gastroesophageal reflux disease 03/24/2016  . GAD (generalized anxiety disorder) 03/24/2016  . Iron deficiency anemia 03/24/2016  . History of pulmonary embolism 03/27/2016  . History of DVT (deep vein thrombosis) 03/27/2016  . Chronic tension-type headache, not intractable 03/27/2016  . Primary insomnia 03/27/2016  . Bilateral lower extremity edema 03/27/2016  . Chronic pain syndrome 05/25/2016  . Morbidly obese (HCC) 05/27/2016  . Serum calcium elevated 08/30/2016  . Diarrhea 08/30/2016  . Collagenous colitis 10/18/2016  . Barrett's esophagus without dysplasia 10/18/2016  . Arthralgia 10/18/2016  . Rash and nonspecific skin eruption 10/18/2016  . Memory changes 11/23/2016  . Major depression, recurrent, chronic (HCC) 11/23/2016  . OSA (obstructive sleep apnea) 11/23/2016  . Twitching 12/22/2016  . Cough 12/22/2016  . Bilateral hip pain 12/22/2016  . Tripping over things 01/12/2017  . Left arm pain 01/12/2017  . Left elbow pain 01/12/2017  . Numbness and tingling in left arm 01/17/2017  . Word finding difficulty 03/15/2017  . Intractable migraine without aura and with status migrainosus 03/15/2017  . Edema of both feet 07/04/2017  . B12 deficiency 07/04/2017  . Acquired hypothyroidism 07/04/2017  . Vitamin D deficiency 07/04/2017  . Secondary hyperparathyroidism, non-renal (HCC) 07/04/2017  . Psoriasis 07/06/2017  . Chronic fatigue 07/06/2017  . Easy bruising 07/26/2017  . Myofascial pain 08/19/2017  . Itching 08/19/2017  . Yeast infection 08/19/2017   Resolved Ambulatory Problems    Diagnosis Date Noted  . Hyperparathyroidism, primary (HCC) 09/18/2016   Past Medical History:  Diagnosis Date  .  Chronic pain syndrome 05/25/2016  . Gastroesophageal reflux disease 03/24/2016  . History of DVT (deep vein thrombosis) 03/27/2016  . History of pulmonary embolism 03/27/2016  . IBS (irritable bowel syndrome)   .  Microscopic colitis   . Migraine   . Morbid obesity (HCC) 05/27/2016  . Psoriatic arthritis (HCC)        Objective:   Physical Exam  Constitutional: She is oriented to person, place, and time. She appears well-developed.  Non ambulatory.   HENT:  Head: Normocephalic and atraumatic.  Right Ear: External ear normal.  Left Ear: External ear normal.  Eyes: Conjunctivae and EOM are normal.  Cardiovascular: Normal rate and regular rhythm.  Pulmonary/Chest: Effort normal and breath sounds normal.  Musculoskeletal:  Tenderness and tightness of paraspinous cervical muscles.   Neurological: She is alert and oriented to person, place, and time.  Skin:  Healed macular lesions of bilateral legs. Almost appear like brusies or scaring from sores.   Psychiatric: She has a normal mood and affect. Her behavior is normal.     .. Vitals:   08/17/17 1324  BP: (!) 102/47  Pulse: (!) 102  SpO2: 96%   .[name]  Assessment & Plan:  Marland KitchenMarland KitchenEverley was seen today for urticaria.  Diagnoses and all orders for this visit:  Itching -     COMPLETE METABOLIC PANEL WITH GFR -     CBC w/Diff/Platelet  Chronic pain syndrome -     gabapentin (NEURONTIN) 600 MG tablet; One tabs PO TID  Chronic tension-type headache, not intractable -     amitriptyline (ELAVIL) 50 MG tablet; Take 1 tablet (50 mg total) by mouth at bedtime.  Major depression, recurrent, chronic (HCC)  Myofascial pain -     gabapentin (NEURONTIN) 600 MG tablet; One tabs PO TID -     COMPLETE METABOLIC PANEL WITH GFR -     CBC w/Diff/Platelet -     amitriptyline (ELAVIL) 50 MG tablet; Take 1 tablet (50 mg total) by mouth at bedtime.  Chronic fatigue -     modafinil (PROVIGIL) 200 MG tablet; Take 1 tablet (200 mg total) by mouth daily.  Yeast infection  Other orders -     fluconazole (DIFLUCAN) 150 MG tablet; Take 1 tablet (150 mg total) by mouth once for 1 dose.  discussed how itching could be a different presentation of pain. Continue to  treat symptomatically. We could consider referral to allergist but at this time with her insurance not allowing many specialist visits that is not a good idea. Her itching did start when her hyslinga and gabapentin were decreased and stopped. Increase gabapentin.   Sent diflucan for yeast infection after abx.   Increased provigil for fatigue.   I would like to send pt to headache specialist but she cannot afford specialist copay at this point. Tried topamax, propranolol, gabapentin, emgality. Trigger point injections have helped in the past. Request them today.   Trigger Point Injection   Pre-operative diagnosis: myofascial pain  Post-operative diagnosis: myofascial pain  After risks and benefits were explained including bleeding, infection, worsening of the pain, damage to the area being injected, weakness, allergic reaction to medications, vascular injection, and nerve damage, signed consent was obtained.  All questions were answered.    The area of the trigger point was identified and the skin prepped three times with alcohol and the alcohol allowed to dry.  Next, a 22 gauge 1 inch needle was placed in the area of the trigger point.  Once reproduction  of the pain was elicited and negative aspiration confirmed, the trigger point was injected and the needle removed.    The patient did tolerate the procedure well and there were not complications.    Medication used: 10 mg Kenalog; 4 mL 1% Mepivicaine.    Trigger points injected: 6    Trigger point(s) location(s):  cervical paraspinal muscles.

## 2017-08-19 ENCOUNTER — Encounter: Payer: Self-pay | Admitting: Physician Assistant

## 2017-08-19 DIAGNOSIS — M7918 Myalgia, other site: Secondary | ICD-10-CM | POA: Insufficient documentation

## 2017-08-19 DIAGNOSIS — L299 Pruritus, unspecified: Secondary | ICD-10-CM | POA: Insufficient documentation

## 2017-08-19 DIAGNOSIS — B379 Candidiasis, unspecified: Secondary | ICD-10-CM | POA: Insufficient documentation

## 2017-08-19 MED ORDER — FLUCONAZOLE 150 MG PO TABS: 150.0000 mg | ORAL_TABLET | Freq: Once | ORAL | 0 refills | Status: AC

## 2017-08-29 ENCOUNTER — Ambulatory Visit

## 2017-08-29 DIAGNOSIS — O039 Complete or unspecified spontaneous abortion without complication: Secondary | ICD-10-CM

## 2017-08-29 DIAGNOSIS — N898 Other specified noninflammatory disorders of vagina: Secondary | ICD-10-CM

## 2017-08-29 DIAGNOSIS — F430 Acute stress reaction: Secondary | ICD-10-CM

## 2017-08-29 MED ORDER — PAROXETINE HCL 10 MG PO TABS
10 mg | ORAL_TABLET | Freq: Every day | ORAL | 0 refills | Status: AC
Start: 2017-08-29 — End: 2017-11-03

## 2017-08-29 MED ORDER — PAROXETINE HCL 10 MG PO TABS
10 mg | ORAL_TABLET | Freq: Every day | ORAL | 0 refills | Status: AC
Start: 2017-08-29 — End: 2017-08-30

## 2017-08-29 NOTE — Progress Notes
[facility]  [facility]  [address]  [place] [address]  Phone: [phone]  FAX: [phone]      Subjective:     CC: No chief complaint on file.    HPI:   [facility] is a 47 y.o. female with yello green vaginal discharge x 1 day.   6/28: patient had spontaneous miscarriage (no heartbeat detected on fetal USG).  Pt is managing expectantly. Has not had any vaginal bloody discharge. Denies any fevers, tachycardia, chills, abdominal tenderness, emesis, or rash.   Denies any dysuria. Requests blood test for infection.     H/o depression following similar miscarriage in past, Rx sertraline however never started. 10 years ago prescribed low dose paroxetine with good effect. Request having prescription available to her if she develops symptoms of depression. She is not planning on trying for pregnancy again.      She has been Rh neg in past and received rhogam.     Her young daughter is with her today in the exam room.       Review of Systems  A 14 system review performed; all systems negative except for as documented in hpi.       Past Medical History  Patient Active Problem List   Diagnosis   ??? Tinnitus of left ear   ??? Vertigo   ??? Coccyx pain   ??? Toe pain, left   ??? History of in vitro fertilization   ??? Abscess   ??? Hyperlipidemia, unspecified   ??? Mood changes   ??? Family history of colorectal cancer   ??? Family history of colon cancer requiring screening colonoscopy       Medications/Supplements  No outpatient prescriptions have been marked as taking for the 08/29/17 encounter (Office Visit) with Delos Haring., MD.         All  Allergies   Allergen Reactions   ??? Sulfamethoxazole-Trimethoprim      Eye swelling       Social:  Social History     Social History   ??? Marital status: Married     Spouse name: N/A   ??? Number of children: N/A   ??? Years of education: N/A     Occupational History   ??? Not on file.     Social History Main Topics ??? Smoking status: Former Smoker   ??? Smokeless tobacco: Never Used      Comment: quit 10 yrs ago, smoked socially x15 yrs   ??? Alcohol use No   ??? Drug use: No   ??? Sexual activity: Yes     Partners: Male     Birth control/ protection: None     Other Topics Concern   ??? Not on file     Social History Narrative    Home: Lives in [place] with husband and 2 kids (twins); originally from [place], then [place] x[place] yrs moved here in 2010; recent changes: no changes other than potentially moving dad to come live with her (in [place]) as she is DPOA and managing his health care, going back and forth to [place] to are for dad;  living situation and social supports: good    Family and Relationships: sister lives in [place], one in [place], 2 brothers in [place], dad is in [place] as above. Husband and kids as above.     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     General: alert, well appearing, and in no distress, oriented to person, place, and time and normal appearing weight.  Head: Atraumatic, normocephalic  Eyes: pupils equal and reactive, extraocular eye movements intact, sclera anicteric.  Heart: normal rate and regular rhythm, S1 and S2 normal. Peripheral pulses: normal  Lungs: clear to auscultation, no wheezes, rales or rhonchi, symmetric air entry and normal work of breathing.  Abdomen: tenderness noted llq,. no uterine tenderness or suprapubic tenderness. , no rebound tenderness noted  MSK: no joint tenderness, deformity or swelling.  Skin: normal coloration and turgor, no rashes, no suspicious skin lesions noted.  Neuro: alert, oriented, normal speech, no focal findings or movement disorder noted  Psych:  Normal affect, insight, and judgment. Assessment/Plan       1. Spontaneous abortion in first trimester  - Obstetric Blood Type and Antibody Screen  -expectant management per REI  -- hCG, Total Beta, Quantitative    2. Vaginal discharge low clinical suspicion for endometritis given stability of vital signs, no uterine tenderness.   - CBC & Auto Differential  - Bacterial Culture Urine  -return precautions given.     3. Acute stress disorder with h/o MDD  -Rx given for PARoxetine 10 mg tablet; Take 1 tablet (10 mg total) by mouth daily.  Dispense: 30 tablet; Refill: 0  -pt advised to establish care and f/u new PMD, Dr. Brantley Persons in 2-4 weeeks, or earlier if mood symptoms worsen      The above plan of care, diagnosis, orders, and follow-up were discussed with the patient.  Questions related to this recommended plan of care were answered.    Arian Mcquitty B. Elmon Kirschner, MD  08/29/2017 at 12:44 PM

## 2017-08-30 LAB — Differential Automated: NEUTROPHIL PERCENT, AUTO: 59.6 (ref 1.30–3.40)

## 2017-08-30 LAB — CBC: WHITE BLOOD CELL COUNT: 7.2 10*3/uL (ref 4.16–9.95)

## 2017-08-30 LAB — hCG, Total Beta, Quantitative: HCG, INTACT & BETA: 32219 m[IU]/mL

## 2017-08-31 ENCOUNTER — Ambulatory Visit

## 2017-08-31 LAB — Bacterial Culture Urine: BACTERIAL CULTURE URINE: NO GROWTH

## 2017-09-06 ENCOUNTER — Inpatient Hospital Stay: Admit: 2017-09-06 | Discharge: 2017-09-06 | Discharge status: Against Medical Advice | Source: Home / Self Care

## 2017-09-06 NOTE — ED Notes
This patient has left prior to being evaluated by MD.  At time of departure, patient was accompanied by her husband, was in no distress, well appearing.  She was encouraged to return for any concerns.

## 2017-09-15 ENCOUNTER — Encounter: Payer: Self-pay | Admitting: Physician Assistant

## 2017-09-16 ENCOUNTER — Ambulatory Visit (INDEPENDENT_AMBULATORY_CARE_PROVIDER_SITE_OTHER): Payer: PRIVATE HEALTH INSURANCE | Admitting: Family Medicine

## 2017-09-16 VITALS — BP 143/93 | HR 95 | Temp 98.1°F | Wt 339.0 lb

## 2017-09-16 DIAGNOSIS — L4050 Arthropathic psoriasis, unspecified: Secondary | ICD-10-CM | POA: Diagnosis not present

## 2017-09-16 DIAGNOSIS — R509 Fever, unspecified: Secondary | ICD-10-CM | POA: Diagnosis not present

## 2017-09-16 DIAGNOSIS — M791 Myalgia, unspecified site: Secondary | ICD-10-CM

## 2017-09-16 DIAGNOSIS — N3001 Acute cystitis with hematuria: Secondary | ICD-10-CM | POA: Diagnosis not present

## 2017-09-16 DIAGNOSIS — R109 Unspecified abdominal pain: Secondary | ICD-10-CM | POA: Diagnosis not present

## 2017-09-16 DIAGNOSIS — R300 Dysuria: Secondary | ICD-10-CM

## 2017-09-16 LAB — COMPLETE METABOLIC PANEL WITH GFR
AG Ratio: 1.5 (calc) (ref 1.0–2.5)
ALT: 30 U/L — AB (ref 6–29)
AST: 17 U/L (ref 10–35)
Albumin: 3.6 g/dL (ref 3.6–5.1)
Alkaline phosphatase (APISO): 163 U/L — ABNORMAL HIGH (ref 33–115)
BUN: 15 mg/dL (ref 7–25)
CALCIUM: 9.4 mg/dL (ref 8.6–10.2)
CO2: 29 mmol/L (ref 20–32)
CREATININE: 0.98 mg/dL (ref 0.50–1.10)
Chloride: 102 mmol/L (ref 98–110)
GFR, EST AFRICAN AMERICAN: 80 mL/min/{1.73_m2} (ref >=60)
GFR, EST NON AFRICAN AMERICAN: 69 mL/min/{1.73_m2} (ref >=60)
GLOBULIN: 2.4 g/dL (ref 1.9–3.7)
Glucose, Bld: 96 mg/dL (ref 65–99)
POTASSIUM: 3.9 mmol/L (ref 3.5–5.3)
Sodium: 138 mmol/L (ref 135–146)
Total Bilirubin: 0.9 mg/dL (ref 0.2–1.2)
Total Protein: 6 g/dL — ABNORMAL LOW (ref 6.1–8.1)

## 2017-09-16 LAB — SEDIMENTATION RATE: Sed Rate: 82 mm/h — ABNORMAL HIGH (ref 0–20)

## 2017-09-16 LAB — POCT URINALYSIS DIPSTICK
Bilirubin, UA: NEGATIVE
Glucose, UA: NEGATIVE
Ketones, UA: NEGATIVE
NITRITE UA: NEGATIVE
PROTEIN UA: POSITIVE — AB
SPEC GRAV UA: 1.015 (ref 1.010–1.025)
Urobilinogen, UA: 0.2 E.U./dL
pH, UA: 6 (ref 5.0–8.0)

## 2017-09-16 LAB — CBC WITH DIFFERENTIAL/PLATELET
Basophils Absolute: 56 cells/uL (ref 0–200)
Basophils Relative: 0.3 %
EOS PCT: 0.5 %
Eosinophils Absolute: 93 cells/uL (ref 15–500)
HCT: 39.1 % (ref 35.0–45.0)
Hemoglobin: 13.1 g/dL (ref 11.7–15.5)
LYMPHS ABS: 870 {cells}/uL (ref 850–3900)
MCH: 28.1 pg (ref 27.0–33.0)
MCHC: 33.5 g/dL (ref 32.0–36.0)
MCV: 83.7 fL (ref 80.0–100.0)
MPV: 11.9 fL (ref 7.5–12.5)
Monocytes Relative: 5.6 %
NEUTROS PCT: 88.9 %
Neutro Abs: 16447 cells/uL — ABNORMAL HIGH (ref 1500–7800)
PLATELETS: 174 10*3/uL (ref 140–400)
RBC: 4.67 10*6/uL (ref 3.80–5.10)
RDW: 12.7 % (ref 11.0–15.0)
TOTAL LYMPHOCYTE: 4.7 %
WBC mixed population: 1036 cells/uL — ABNORMAL HIGH (ref 200–950)
WBC: 18.5 10*3/uL — AB (ref 3.8–10.8)

## 2017-09-16 LAB — CK: Total CK: 30 U/L (ref 29–143)

## 2017-09-16 MED ORDER — PREDNISONE 5 MG (48) PO TBPK: ORAL_TABLET | ORAL | 0 refills | Status: DC

## 2017-09-16 MED ORDER — CEFDINIR 300 MG PO CAPS: 300.0000 mg | ORAL_CAPSULE | Freq: Two times a day (BID) | ORAL | 0 refills | Status: DC

## 2017-09-16 NOTE — Patient Instructions (Addendum)
Thank you for coming in today.   [facility]  Start omnicef for infection  Wait to take the prednisone until you hear about labs.   Itching Zyrtec (certizine) twice daily.   Recheck as needed.   Call or go to the emergency room if you get worse, have trouble breathing, have chest pains, or palpitations.

## 2017-09-16 NOTE — Progress Notes (Signed)
Sarah Mayer is a 47 y.o. female who presents to [facility]: Primary Care Sports Medicine today for UTI symptoms, fevers and chills, body aches, itching, psoriatic arthritis.  Seher has a pertinent medical history for adequately treated psoriatic arthritis.  She has an established relationship with a rheumatologist but is having trouble getting her infusions on a regular scheduled basis due to her variety of issues related to her insurance and cost.  She notes that she has failed Humira.  She was due for her repeat infusion last week.  She is interested in a different rheumatologist office if possible.  She notes however over the last week or so she is been developing fevers and body aches Reiger's and chills.  She measured a temperature of over 103 degrees and she is been using Tylenol which has helped.  The fever seemed to be weaning off along with the worst of the body aches.  Over the last few days she is developed urinary frequency urgency and dysuria.  She has a recent history for urinary tract infection last month.  She notes her symptoms are consistent with previous UTI.  She denies any tick bites.  She notes with the onset of her body aches and fever she is been having worsening itching.  Previously this is been a sign that her psoriatic arthritis has been worsening.    ROS as above:  Exam:  BP (!) 143/93   Pulse 95   Temp 98.1 F (36.7 C) (Oral)   Wt (!) 339 lb (153.8 kg)   BMI 58.19 kg/m  Gen: Well NAD nontoxic appearing.  Obese. HEENT: EOMI,  MMM Lungs: Normal work of breathing. CTABL Heart: RRR no MRG Abd: NABS, Soft. Nondistended, Nontender no CVA angle tenderness to percussion Exts: Brisk capillary refill, warm and well perfused.   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  Lab and  Radiology Results Results for orders placed or performed in visit on 09/16/17 (from the past 72 hour(s))  POCT Urinalysis Dipstick     Status: Abnormal   Collection Time: 09/16/17 11:18 AM  Result Value Ref Range   Color, UA yellow    Clarity, UA clear    Glucose, UA Negative Negative   Bilirubin, UA negative    Ketones, UA negative    Spec Grav, UA 1.015 1.010 - 1.025   Blood, UA large    pH, UA 6.0 5.0 - 8.0   Protein, UA Positive (A) Negative   Urobilinogen, UA 0.2 0.2 or 1.0 E.U./dL   Nitrite, UA negative    Leukocytes, UA Small (1+) (A) Negative   Appearance     Odor     No results found.    Assessment and Plan: 47 y.o. female with  Urinary tract infection.  History of fever but no fever today.  Patient does not appear to be critically ill.  Plan for empiric treatment with Omnicef.  This should work well against common urinary pathogens and also is not on her allergy list.  Culture pending.  Fevers and body aches.  Unclear etiology.  This may be related to her urinary tract infection however if she were to have serious systemic illness such as sepsis from urinary source I would expect her to be worsening and not improving.  Regardless we will check CBC with differential metabolic panel CK and sed rate.  I suspect the most likely cause is an  exacerbation of her psoriatic arthritis.  Will prescribe prednisone for use once were sure that she does not have a serious infection.  Ultimately she will benefit from prompt rheumatologic care.  Follow-up with PCP later next week.  [facility]   Orders Placed This Encounter  Procedures  . Urine Culture  . CBC with Differential/Platelet  . COMPLETE METABOLIC PANEL WITH GFR  . CK  . Sedimentation rate  . Ambulatory referral to Rheumatology    Referral Priority:   Routine    Referral Type:   Consultation    Referral Reason:   Specialty Services Required    Requested Specialty:   Rheumatology    Number  of Visits Requested:   1  . POCT Urinalysis Dipstick   Meds ordered this encounter  Medications  . cefdinir (OMNICEF) 300 MG capsule    Sig: Take 1 capsule (300 mg total) by mouth 2 (two) times daily.    Dispense:  20 capsule    Refill:  0  . predniSONE (STERAPRED UNI-PAK 48 TAB) 5 MG (48) TBPK tablet    Sig: 12 day dosepack po    Dispense:  48 tablet    Refill:  0     Historical information moved to improve visibility of documentation.  Past Medical History:  Diagnosis Date  . Chronic pain syndrome 05/25/2016  . Gastroesophageal reflux disease 03/24/2016  . History of DVT (deep vein thrombosis) 03/27/2016  . History of pulmonary embolism 03/27/2016  . IBS (irritable bowel syndrome)   . Microscopic colitis   . Migraine   . Morbid obesity (HCC) 05/27/2016  . Psoriatic arthritis [facility](HCC)    Past Surgical History:  Procedure Laterality Date  . COLONOSCOPY W/ BIOPSIES    . ESOPHAGOGASTRODUODENOSCOPY     Social History   Tobacco Use  . Smoking status: Never Smoker  . Smokeless tobacco: Never Used  Substance Use Topics  . Alcohol use: No   family history is not on file.  Medications: Current Outpatient Medications  Medication Sig Dispense Refill  . albuterol (PROVENTIL HFA;VENTOLIN HFA) 108 (90 Base) MCG/ACT inhaler Inhale 2 puffs into the lungs every 6 (six) hours as needed for wheezing or shortness of breath. 1 Inhaler 2  . AMBULATORY NON FORMULARY MEDICATION Ultra Fine  6mm pen needles to be used with Saxenda 100 each 1  . AMBULATORY NON FORMULARY MEDICATION Power wheelchair due to not being able to ambulate. 1 Device 0  . amitriptyline (ELAVIL) 50 MG tablet Take 1 tablet (50 mg total) by mouth at bedtime. 30 tablet 1  . augmented betamethasone dipropionate (DIPROLENE-AF) 0.05 % ointment     . butalbital-acetaminophen-caffeine (FIORICET WITH CODEINE) 50-325-40-30 MG capsule TAKE ONE CAPSULE BY MOUTH EVERY 4 HOURS AS NEEDED 30 capsule 0  . cyclobenzaprine (FLEXERIL) 10 MG tablet  Take 5-10 mg by mouth.    . diazepam (VALIUM) 5 MG tablet Take 1 tab PO 2 hours before procedure or imaging. 2 tablet 0  . diclofenac sodium (VOLTAREN) 1 % GEL APPLY 4 GRAMS TOPICALLY FOUR (4) TIMES A DAY.  3  . EMGALITY 120 MG/ML SOAJ INJECT 120 MG UNDER THE SKIN EVERY 30 DAYS 1 mL 0  . fexofenadine (ALLEGRA) 180 MG tablet Take 180 mg by mouth.    . fluocinonide cream (LIDEX) 0.05 % Apply topically 2 (two) times daily. 60 g 2  . furosemide (LASIX) 20 MG tablet TAKE 1 TABLET(20 MG) BY MOUTH DAILY 90 tablet 0  . gabapentin (NEURONTIN) 300 MG  capsule Take 600 mg by mouth.    . gabapentin (NEURONTIN) 600 MG tablet One tabs PO TID 180 tablet 1  . HYDROcodone-acetaminophen (NORCO/VICODIN) 5-325 MG tablet Take by mouth.    . hydrOXYzine (ATARAX/VISTARIL) 50 MG tablet Take 1 tablet (50 mg total) by mouth at bedtime and may repeat dose one time if needed. For itching. 30 tablet 1  . hyoscyamine (LEVBID) 0.375 MG 12 hr tablet Take 1 tablet (0.375 mg total) by mouth 2 (two) times daily. 60 tablet 2  . HYSINGLA ER 30 MG T24A     . ketoconazole (NIZORAL) 2 % cream Apply 1 application topically 2 (two) times daily. To affected areas. 60 g 1  . modafinil (PROVIGIL) 200 MG tablet Take 1 tablet (200 mg total) by mouth daily. 60 tablet 1  . montelukast (SINGULAIR) 10 MG tablet Take 1 tablet (10 mg total) at bedtime by mouth. 90 tablet 3  . Omega-3 Fatty Acids (FISH OIL) 1000 MG CPDR Take by mouth.    . ondansetron (ZOFRAN-ODT) 8 MG disintegrating tablet Take 1 tablet (8 mg total) by mouth every 8 (eight) hours as needed for nausea. 30 tablet 3  . RABEprazole (ACIPHEX) 20 MG tablet Take 1 tablet (20 mg total) by mouth daily. 30 tablet 11  . ranitidine (ZANTAC) 150 MG capsule Take 150 mg by mouth 2 (two) times daily.    . Salicylic Acid 6 % SHAM     . SAXENDA 18 MG/3ML SOPN INJECT 3 MG UNDER THE SKIN DAILY 12 pen 1  . sucralfate (CARAFATE) 1 g tablet TAKE 1 TABLET(1 GRAM) BY MOUTH FOUR TIMES DAILY 120 tablet 0    . TRINTELLIX 20 MG TABS tablet TAKE 1 TABLET BY MOUTH EVERY DAY 90 tablet 0  . zolpidem (AMBIEN) 5 MG tablet TAKE 1 TABLET BY MOUTH EVERY NIGHT AT BEDTIME 30 tablet 5  . cefdinir (OMNICEF) 300 MG capsule Take 1 capsule (300 mg total) by mouth 2 (two) times daily. 20 capsule 0  . predniSONE (STERAPRED UNI-PAK 48 TAB) 5 MG (48) TBPK tablet 12 day dosepack po 48 tablet 0   No current facility-administered medications for this visit.    Allergies  Allergen Reactions  . Moxifloxacin Hcl In Nacl Rash  . Clarithromycin     Unable to remember  . Imitrex [Sumatriptan]     Dizziness/somulence/nausea  . Nsaids     IRRITATES "COLITIS"  . Topamax [Topiramate]     rash  . Moxifloxacin Rash  . Penicillins Rash  . Sulfa Antibiotics Rash  . Sumatriptan Succinate Nausea And Vomiting    19 Jan 2011     Discussed warning signs or symptoms. Please see discharge instructions. Patient expresses understanding.

## 2017-09-17 ENCOUNTER — Ambulatory Visit

## 2017-09-17 DIAGNOSIS — N3001 Acute cystitis with hematuria: Secondary | ICD-10-CM

## 2017-09-17 DIAGNOSIS — R300 Dysuria: Secondary | ICD-10-CM

## 2017-09-17 MED ORDER — CIPROFLOXACIN HCL 500 MG PO TABS
500 mg | ORAL_TABLET | Freq: Two times a day (BID) | ORAL | 0 refills | Status: AC
Start: 2017-09-17 — End: 2018-04-23

## 2017-09-17 MED ADMIN — CEFTRIAXONE 350 MG/ML IM INJECTION: 1 g | INTRAMUSCULAR | @ 23:00:00 | Stop: 2017-09-17 | NDC 00409733201

## 2017-09-17 NOTE — Progress Notes
[facility] INTERNAL MEDICINE & PEDIATRICS  [facility]AT[facility]  [address]  [place] [address]  Phone: [phone]  FAX: [phone]    URGENT CARE    Subjective:     CC: Urinary symptoms for 7 days    HPI: Vanessa Terry is a 47 y.o. female who presents with: dysuria and hematuria.  Symptoms for 1 week  Initially had dizziness and then foul smelling urine.   Saw reproductive endocrinologist and they took a culture and started on bactrim and then developed left eye swelling  Stopped bactrim after 1 day and started macrobid 100 BID and has completed 3 days.   Symptoms are a little better but not resolved, urgency is a little better  Still with dysuria and nausea and blood when wiping.     Had a miscarriage last week.  Not sure of culture results from outside lab.    Medications  No outpatient prescriptions have been marked as taking for the 09/17/17 encounter (Office Visit) with Naomie Dean., MD.       Review of Systems  The following review of systems completed and were negative except as described above: Constitutional, Respiratory, GI and GU    Objective:     Physical Exam  BP 107/68  ~ Pulse 58  ~ Temp 37.2 ???C (99 ???F) (Tympanic)  ~ Ht 5' 4'' (1.626 m)  ~ Wt 141 lb (64 kg)  ~ LMP 06/20/2017 (Exact Date)  ~ BMI 24.20 kg/m???     General: alert, well appearing, and in no distress  CVS: Regular rate and rhythm, normal S1/S2, no murmurs, normal pulses and capillary fill  Lungs: clear to auscultation, no wheezes, rales, or rhonchi, no tachypnea, retractions, or cyanosis  Abdomen: Abdomen is soft, +suprapubic tenderness, normal bowel sounds  MSK: no CVA tenderness, normal gait  Skin: no rash, no lesions.    UA with moderate blood and small leuks    Assessment:     Vanessa Terry is a 47 y.o. female who presents with likely partially treated cystitis/early pyelonephritis.    Plan:     1. Push fluids, may use OTC Pyridium for symptomatic relief 2. Given elevated temp and lack of clear improvement on macrobid will give dose of ceftriaxone 1 g in clinic today  3. Ciprofloxacin 500 mg PO BID for 7 days  4. Urine culture sent. Will follow-up results with patient   5. To try and obtain outside urine results and let me know via messaging.     The patient was advised to call or return should symptoms should she develop fevers, new back pain, persistent vomiting, or no improvement 48 hours after starting antibiotics.    The above plan of care, diagnosis, orders, and follow-up were discussed with the patient.  Questions related to this recommended plan of care were answered.    Naomie Dean, MD  09/17/2017 at 3:00 PM

## 2017-09-17 NOTE — Nursing Note
Verified patients name,date of birth and mediatation allergies. 1 gram of Ceftriaxone given in right gluteal. Patient tolerated procedure well, patient observed for 15 min, no adverse reaction noted.

## 2017-09-18 LAB — UA,Dipstick: PROTEIN: NEGATIVE (ref 1.005–1.030)

## 2017-09-18 LAB — UA,Microscopic: RBCS: 6 {cells}/uL (ref 0–11)

## 2017-09-18 LAB — URINE CULTURE
MICRO NUMBER:: 90882427
SPECIMEN QUALITY:: ADEQUATE

## 2017-09-19 LAB — Bacterial Culture Urine: BACTERIAL CULTURE URINE: <10000 — AB

## 2017-09-20 ENCOUNTER — Encounter: Payer: Self-pay | Admitting: Family Medicine

## 2017-09-21 ENCOUNTER — Encounter: Payer: Self-pay | Admitting: Physician Assistant

## 2017-09-21 DIAGNOSIS — F339 Major depressive disorder, recurrent, unspecified: Secondary | ICD-10-CM

## 2017-09-21 MED ORDER — VORTIOXETINE HBR 20 MG PO TABS: 20.0000 mg | ORAL_TABLET | Freq: Every day | ORAL | 0 refills | Status: DC

## 2017-10-04 ENCOUNTER — Telehealth: Payer: Self-pay | Admitting: Physician Assistant

## 2017-10-04 NOTE — Telephone Encounter (Signed)
Received fax from [employer] that Modafinal requires a PA. Information has been sent to the insurance company. Awaiting determination.

## 2017-10-11 ENCOUNTER — Ambulatory Visit: Payer: BLUE CROSS/BLUE SHIELD | Admitting: Endocrinology

## 2017-10-12 NOTE — Telephone Encounter (Signed)
Insurance approved.

## 2017-10-26 ENCOUNTER — Ambulatory Visit (INDEPENDENT_AMBULATORY_CARE_PROVIDER_SITE_OTHER): Payer: PRIVATE HEALTH INSURANCE | Admitting: Family Medicine

## 2017-10-26 ENCOUNTER — Encounter: Payer: Self-pay | Admitting: Family Medicine

## 2017-10-26 VITALS — BP 130/92 | HR 84 | Temp 97.8°F | Ht 64.0 in

## 2017-10-26 DIAGNOSIS — Z23 Encounter for immunization: Secondary | ICD-10-CM

## 2017-10-26 DIAGNOSIS — L4050 Arthropathic psoriasis, unspecified: Secondary | ICD-10-CM

## 2017-10-26 DIAGNOSIS — B3781 Candidal esophagitis: Secondary | ICD-10-CM

## 2017-10-26 MED ORDER — FLUCONAZOLE 200 MG PO TABS: 200.0000 mg | ORAL_TABLET | Freq: Every day | ORAL | 1 refills | Status: DC

## 2017-10-26 NOTE — Progress Notes (Signed)
Sarah Mayer is a 47 y.o. female who presents to [facility]: Primary Care [facility] Medicine today for yeast infection in throat.   She has a history of collagenous colitis for which she is currently on a 60 day course of Entocort. She has noticed some white on the back of her throat. She has had EGD confirmed esophageal candidiasis while on Entocort in the past and says that this looks and feel just like those episodes. She says in the past she has taken oral fluconazole and the injection has cleared up. Additionally, she says she notices that her neck and cheeks get swollen periodically. She wonders if this is concerning or side effect of steroids.      ROS as above:  Exam:  BP (!) 130/92   Pulse 84   Temp 97.8 F (36.6 C) (Oral)   Ht 5\' 4"  (1.626 m)   BMI 58.19 kg/m  Wt Readings from Last 5 Encounters:  09/16/17 (!) 339 lb (153.8 kg)  07/02/17 (!) 327 lb 14.4 oz (148.7 kg)  06/25/17 (!) 329 lb (149.2 kg)  04/12/17 (!) 343 lb (155.6 kg)  03/17/17 (!) 349 lb (158.3 kg)    Gen: Well NAD HEENT: EOMI,  MMM. Normal appearing throat with no erythema or white plaques. No cervical or supraclavicular lymphadenopathy.  Lungs: Normal work of breathing. CTABL Heart: RRR no MRG Abd: NABS, Soft. Nondistended, Nontender Exts: Brisk capillary refill, warm and well perfused.   Lab and Radiology Results No results found for this or any previous visit (from the past 72 hour(s)). No results found.    Assessment and Plan: 47 y.o. female with likely esophageal candidiasis. She has had EC in the past while on Entocort and says that she looks and feel just like these episodes. The plan will be to start oral fluconazole to treat the candidiasis. Additionally, her neck and cheek swelling is likely a side effect of chronic steroids and is not concerning. She should return to clinic if symptoms not  improving.   Additionally patient was referred to rheumatology at last visit.  She notes that location will not work and she needs a second referral.  New referral for psoriatic arthritis.  Orders Placed This Encounter  Procedures  . Ambulatory referral to Rheumatology    Referral Priority:   Routine    Referral Type:   Consultation    Referral Reason:   Specialty Services Required    Requested Specialty:   Rheumatology    Number of Visits Requested:   1   Meds ordered this encounter  Medications  . fluconazole (DIFLUCAN) 200 MG tablet    Sig: Take 1 tablet (200 mg total) by mouth daily.    Dispense:  14 tablet    Refill:  1     Historical information moved to improve visibility of documentation.  Past Medical History:  Diagnosis Date  . Chronic pain syndrome 05/25/2016  . Gastroesophageal reflux disease 03/24/2016  . History of DVT (deep vein thrombosis) 03/27/2016  . History of pulmonary embolism 03/27/2016  . IBS (irritable bowel syndrome)   . Microscopic colitis   . Migraine   . Morbid obesity (HCC) 05/27/2016  . Psoriatic arthritis [facility])    Past Surgical History:  Procedure Laterality Date  . COLONOSCOPY W/ BIOPSIES    . ESOPHAGOGASTRODUODENOSCOPY     Social History   Tobacco Use  . Smoking status: Never Smoker  . Smokeless tobacco: Never Used  Substance Use  Topics  . Alcohol use: No   family history is not on file.  Medications: Current Outpatient Medications  Medication Sig Dispense Refill  . albuterol (PROVENTIL HFA;VENTOLIN HFA) 108 (90 Base) MCG/ACT inhaler Inhale 2 puffs into the lungs every 6 (six) hours as needed for wheezing or shortness of breath. 1 Inhaler 2  . AMBULATORY NON FORMULARY MEDICATION Ultra Fine  54mm pen needles to be used with Saxenda 100 each 1  . AMBULATORY NON FORMULARY MEDICATION Power wheelchair due to not being able to ambulate. 1 Device 0  . amitriptyline (ELAVIL) 50 MG tablet Take 1 tablet (50 mg total) by mouth at bedtime. 30 tablet  1  . augmented betamethasone dipropionate (DIPROLENE-AF) 0.05 % ointment     . butalbital-acetaminophen-caffeine (FIORICET WITH CODEINE) 50-325-40-30 MG capsule TAKE ONE CAPSULE BY MOUTH EVERY 4 HOURS AS NEEDED 30 capsule 0  . cyclobenzaprine (FLEXERIL) 10 MG tablet Take 5-10 mg by mouth.    . diazepam (VALIUM) 5 MG tablet Take 1 tab PO 2 hours before procedure or imaging. 2 tablet 0  . diclofenac sodium (VOLTAREN) 1 % GEL APPLY 4 GRAMS TOPICALLY FOUR (4) TIMES A DAY.  3  . EMGALITY 120 MG/ML SOAJ INJECT 120 MG UNDER THE SKIN EVERY 30 DAYS 1 mL 0  . fexofenadine (ALLEGRA) 180 MG tablet Take 180 mg by mouth.    . fluocinonide cream (LIDEX) 0.05 % Apply topically 2 (two) times daily. 60 g 2  . furosemide (LASIX) 20 MG tablet TAKE 1 TABLET(20 MG) BY MOUTH DAILY 90 tablet 0  . gabapentin (NEURONTIN) 300 MG capsule Take 600 mg by mouth.    . gabapentin (NEURONTIN) 600 MG tablet One tabs PO TID 180 tablet 1  . HYDROcodone-acetaminophen (NORCO/VICODIN) 5-325 MG tablet Take by mouth.    . hydrOXYzine (ATARAX/VISTARIL) 50 MG tablet Take 1 tablet (50 mg total) by mouth at bedtime and may repeat dose one time if needed. For itching. 30 tablet 1  . hyoscyamine (LEVBID) 0.375 MG 12 hr tablet Take 1 tablet (0.375 mg total) by mouth 2 (two) times daily. 60 tablet 2  . HYSINGLA ER 30 MG T24A     . ketoconazole (NIZORAL) 2 % cream Apply 1 application topically 2 (two) times daily. To affected areas. 60 g 1  . modafinil (PROVIGIL) 200 MG tablet Take 1 tablet (200 mg total) by mouth daily. 60 tablet 1  . montelukast (SINGULAIR) 10 MG tablet Take 1 tablet (10 mg total) at bedtime by mouth. 90 tablet 3  . Omega-3 Fatty Acids (FISH OIL) 1000 MG CPDR Take by mouth.    . ondansetron (ZOFRAN-ODT) 8 MG disintegrating tablet Take 1 tablet (8 mg total) by mouth every 8 (eight) hours as needed for nausea. 30 tablet 3  . predniSONE (STERAPRED UNI-PAK 48 TAB) 5 MG (48) TBPK tablet 12 day dosepack po 48 tablet 0  .  RABEprazole (ACIPHEX) 20 MG tablet Take 1 tablet (20 mg total) by mouth daily. 30 tablet 11  . ranitidine (ZANTAC) 150 MG capsule Take 150 mg by mouth 2 (two) times daily.    . Salicylic Acid 6 % SHAM     . SAXENDA 18 MG/3ML SOPN INJECT 3 MG UNDER THE SKIN DAILY 12 pen 1  . sucralfate (CARAFATE) 1 g tablet TAKE 1 TABLET(1 GRAM) BY MOUTH FOUR TIMES DAILY 120 tablet 0  . vortioxetine HBr (TRINTELLIX) 20 MG TABS tablet Take 1 tablet (20 mg total) by mouth daily. 90 tablet 0  . zolpidem (AMBIEN)  5 MG tablet TAKE 1 TABLET BY MOUTH EVERY NIGHT AT BEDTIME 30 tablet 5  . fluconazole (DIFLUCAN) 200 MG tablet Take 1 tablet (200 mg total) by mouth daily. 14 tablet 1   No current facility-administered medications for this visit.    Allergies  Allergen Reactions  . Moxifloxacin Hcl In Nacl Rash  . Clarithromycin     Unable to remember  . Imitrex [Sumatriptan]     Dizziness/somulence/nausea  . Nsaids     IRRITATES "COLITIS"  . Topamax [Topiramate]     rash  . Moxifloxacin Rash  . Penicillins Rash  . Sulfa Antibiotics Rash  . Sumatriptan Succinate Nausea And Vomiting    19 Jan 2011     Discussed warning signs or symptoms. Please see discharge instructions. Patient expresses understanding.  I personally was present and performed or re-performed the history, physical exam and medical decision-making activities of this service and have verified that the service and findings are accurately documented in the student's note. ___________________________________________ Clementeen Graham M.D., ABFM., CAQSM. Primary Care and Sports Medicine Adjunct Instructor of Family Medicine  [employer]

## 2017-10-26 NOTE — Patient Instructions (Signed)
Thank you for coming in today. Start fluconazole daily or 14 days.  Let me know if not doing well.

## 2017-11-02 MED ORDER — PAROXETINE HCL 10 MG PO TABS
10 mg | ORAL_TABLET | Freq: Every day | ORAL | 3 refills | Status: AC
Start: 2017-11-02 — End: ?

## 2017-11-27 ENCOUNTER — Other Ambulatory Visit: Payer: Self-pay | Admitting: Physician Assistant

## 2017-11-27 DIAGNOSIS — F5101 Primary insomnia: Secondary | ICD-10-CM

## 2017-11-30 ENCOUNTER — Encounter: Payer: Self-pay | Admitting: Physician Assistant

## 2017-12-01 ENCOUNTER — Ambulatory Visit (INDEPENDENT_AMBULATORY_CARE_PROVIDER_SITE_OTHER): Payer: PRIVATE HEALTH INSURANCE | Admitting: Physician Assistant

## 2017-12-01 ENCOUNTER — Encounter: Payer: Self-pay | Admitting: Physician Assistant

## 2017-12-01 ENCOUNTER — Ambulatory Visit (INDEPENDENT_AMBULATORY_CARE_PROVIDER_SITE_OTHER): Payer: PRIVATE HEALTH INSURANCE

## 2017-12-01 VITALS — BP 132/97 | HR 80 | Ht 64.0 in

## 2017-12-01 DIAGNOSIS — M7989 Other specified soft tissue disorders: Secondary | ICD-10-CM | POA: Diagnosis not present

## 2017-12-01 DIAGNOSIS — G44319 Acute post-traumatic headache, not intractable: Secondary | ICD-10-CM | POA: Diagnosis not present

## 2017-12-01 DIAGNOSIS — M7918 Myalgia, other site: Secondary | ICD-10-CM

## 2017-12-01 DIAGNOSIS — E559 Vitamin D deficiency, unspecified: Secondary | ICD-10-CM | POA: Diagnosis not present

## 2017-12-01 DIAGNOSIS — G43011 Migraine without aura, intractable, with status migrainosus: Secondary | ICD-10-CM

## 2017-12-01 DIAGNOSIS — L659 Nonscarring hair loss, unspecified: Secondary | ICD-10-CM

## 2017-12-01 DIAGNOSIS — E211 Secondary hyperparathyroidism, not elsewhere classified: Secondary | ICD-10-CM

## 2017-12-01 DIAGNOSIS — K580 Irritable bowel syndrome with diarrhea: Secondary | ICD-10-CM

## 2017-12-01 MED ORDER — LEVOCETIRIZINE DIHYDROCHLORIDE 5 MG PO TABS: 5.0000 mg | ORAL_TABLET | Freq: Every evening | ORAL | 4 refills | Status: DC

## 2017-12-01 MED ORDER — ERENUMAB-AOOE 140 MG/ML ~~LOC~~ SOAJ: 1.0000 "pen " | SUBCUTANEOUS | 2 refills | Status: DC

## 2017-12-01 MED ORDER — DICYCLOMINE HCL 10 MG PO CAPS: 10.0000 mg | ORAL_CAPSULE | Freq: Three times a day (TID) | ORAL | 5 refills | Status: DC

## 2017-12-01 MED ORDER — HYDROCODONE-ACETAMINOPHEN 5-325 MG PO TABS: 1.0000 | ORAL_TABLET | Freq: Four times a day (QID) | ORAL | 0 refills | Status: DC | PRN

## 2017-12-01 NOTE — Patient Instructions (Signed)
Concussion, Adult  A concussion is a brain injury from a direct hit (blow) to the head or body. This blow causes the brain to shake quickly back and forth inside the skull. This can damage brain cells and cause chemical changes in the brain. A concussion may also be known as a mild traumatic brain injury (TBI).  Concussions are usually not life-threatening, but the effects of a concussion can be serious. If you have a concussion, you are more likely to experience concussion-like symptoms after a direct blow to the head in the future.  What are the causes?  This condition is caused by:  · A direct blow to the head, such as from running into another player during a game, being hit in a fight, or hitting your head on a hard surface.  · A jolt of the head or neck that causes the brain to move back and forth inside the skull, such as in a car crash.    What are the signs or symptoms?  The signs of a concussion can be hard to notice. Early on, they may be missed by you, family members, and health care providers. You may look fine but act or feel differently.  Symptoms are usually temporary, but they may last for days, weeks, or even longer. Some symptoms may appear right away but other symptoms may not show up for hours or days. Every head injury is different. Symptoms may include:  · Headaches. This can include a feeling of pressure in the head.  · Memory problems.  · Trouble concentrating, organizing, or making decisions.  · Slowness in thinking, acting or reacting, speaking, or reading.  · Confusion.  · Fatigue.  · Changes in eating or sleeping patterns.  · Problems with coordination or balance.  · Nausea or vomiting.  · Numbness or tingling.  · Sensitivity to light or noise.  · Vision or hearing problems.  · Reduced sense of smell.  · Irritability or mood changes.  · Dizziness.  · Lack of motivation.  · Seeing or hearing things that other people do not see or hear (hallucinations).    How is this diagnosed?  This  condition is diagnosed based on:  · Your symptoms.  · A description of your injury.    You may also have tests, including:  · Imaging tests, such as a CT scan or MRI. These are done to look for signs of brain injury.  · Neuropsychological tests. These measure your thinking, understanding, learning, and remembering abilities.    How is this treated?  This condition is treated with physical and mental rest and careful observation, usually at home. If the concussion is severe, you may need to stay home from work for a while. You may be referred to a concussion clinic or to other health care providers for management. It is important that you tell your health care provider if:  · You are taking any medicines, including prescription medicines, over-the-counter medicines, and natural remedies. Some medicines, such as blood thinners (anticoagulants) and aspirin, may increase the chance of complications, such as bleeding.  · You are taking or have taken alcohol or illegal drugs. Alcohol and certain other drugs may slow your recovery and can put you at risk of further injury.    How fast you will recover from a concussion depends on many factors, such as how severe your concussion is, what part of your brain was injured, how old you are, and how healthy you were before   the concussion. Recovery can take time. It is important to wait to return to activity until a health care provider says it is safe to do that and your symptoms are completely gone.  Follow these instructions at home:  Activity  · Limit activities that require a lot of thought or concentration. These may include:  ? Doing homework or job-related work.  ? Watching TV.  ? Working on the computer.  ? Playing memory games and puzzles.  · Rest. Rest helps the brain to heal. Make sure you:  ? Get plenty of sleep at night. Avoid staying up late at night.  ? Keep the same bedtime hours on weekends and weekdays.  ? Rest during the day. Take naps or rest breaks when you  feel tired.  · Having another concussion before the first one has healed can be dangerous. Do not do high-risk activities that could cause a second concussion, such as riding a bicycle or playing sports.  · Ask your health care provider when you can return to your normal activities, such as school, work, athletics, driving, riding a bicycle, or using heavy machinery. Your ability to react may be slower after a brain injury. Never do these activities if you are dizzy. Your health care provider will likely give you a plan for gradually returning to activities.  General instructions  · Take over-the-counter and prescription medicines only as told by your health care provider.  · Do not drink alcohol until your health care provider says you can.  · If it is harder than usual to remember things, write them down.  · If you are easily distracted, try to do one thing at a time. For example, do not try to watch TV while fixing dinner.  · Talk with family members or close friends when making important decisions.  · Watch your symptoms and tell others to do the same. Complications sometimes occur after a concussion. Older adults with a brain injury may have a higher risk of serious complications, such as a blood clot in the brain.  · Tell your teachers, school nurse, school counselor, coach, athletic trainer, or work manager about your injury, symptoms, and restrictions. Tell them about what you can or cannot do. They should watch for:  ? Increased problems with attention or concentration.  ? Increased difficulty remembering or learning new information.  ? Increased time needed to complete tasks or assignments.  ? Increased irritability or decreased ability to cope with stress.  ? Increased symptoms.  · Keep all follow-up visits as told by your health care provider. This is important.  How is this prevented?  It is very important to avoid another brain injury, especially as you recover. In rare cases, another injury can lead  to permanent brain damage, brain swelling, or death. The risk of this is greatest during the first 7-10 days after a head injury. Avoid injuries by:  · Wearing a seat belt when riding in a car.  · Wearing a helmet when biking, skiing, skateboarding, skating, or doing similar activities.  · Avoiding activities that could lead to a second concussion, such as contact or recreational sports, until your health care provider says it is okay.  · Taking safety measures in your home, such as:  ? Removing clutter and tripping hazards from floors and stairways.  ? Using grab bars in bathrooms and handrails by stairs.  ? Placing non-slip mats on floors and in bathtubs.  ? Improving lighting in dim areas.      Contact a health care provider if:  · Your symptoms get worse.  · You have new symptoms.  · You continue to have symptoms for more than 2 weeks.  Get help right away if:  · You have severe or worsening headaches.  · You have weakness or numbness in any part of your body.  · Your coordination gets worse.  · You vomit repeatedly.  · You are sleepier.  · The pupil of one eye is larger than the other.  · You have convulsions or a seizure.  · Your speech is slurred.  · Your fatigue, confusion, or irritability gets worse.  · You cannot recognize people or places.  · You have neck pain.  · It is difficult to wake you up.  · You have unusual behavior changes.  · You lose consciousness.  Summary  · A concussion is a brain injury from a direct hit (blow) to the head or body.  · A concussion may also be called a mild traumatic brain injury (TBI).  · You may have imaging tests and neuropsychological tests to diagnose a concussion.  · This condition is treated with physical and mental rest and careful observation.  · Ask your health care provider when you can return to your normal activities, such as school, work, athletics, driving, riding a bicycle, or using heavy machinery. Follow safety instructions as told by your health care  provider.  This information is not intended to replace advice given to you by your health care provider. Make sure you discuss any questions you have with your health care provider.  Document Released: 05/02/2003 Document Revised: 01/21/2016 Document Reviewed: 01/21/2016  Elsevier Interactive Patient Education © 2018 [employer]

## 2017-12-01 NOTE — Progress Notes (Signed)
Call pt: no fractures, no hematoma, soft tissue swelling in the right forehead. Continue with ice, rest, norco as needed for pain.

## 2017-12-01 NOTE — Progress Notes (Signed)
l °

## 2017-12-02 LAB — CBC WITH DIFFERENTIAL/PLATELET
BASOS PCT: 1.1 %
Basophils Absolute: 81 cells/uL (ref 0–200)
EOS ABS: 252 {cells}/uL (ref 15–500)
Eosinophils Relative: 3.4 %
HCT: 41.9 % (ref 35.0–45.0)
Hemoglobin: 13.9 g/dL (ref 11.7–15.5)
Lymphs Abs: 1880 cells/uL (ref 850–3900)
MCH: 28 pg (ref 27.0–33.0)
MCHC: 33.2 g/dL (ref 32.0–36.0)
MCV: 84.5 fL (ref 80.0–100.0)
MONOS PCT: 7.6 %
MPV: 11.7 fL (ref 7.5–12.5)
NEUTROS PCT: 62.5 %
Neutro Abs: 4625 cells/uL (ref 1500–7800)
PLATELETS: 345 10*3/uL (ref 140–400)
RBC: 4.96 10*6/uL (ref 3.80–5.10)
RDW: 12.4 % (ref 11.0–15.0)
TOTAL LYMPHOCYTE: 25.4 %
WBC mixed population: 562 cells/uL (ref 200–950)
WBC: 7.4 10*3/uL (ref 3.8–10.8)

## 2017-12-02 LAB — B12 AND FOLATE PANEL
FOLATE: 10.1 ng/mL
Vitamin B-12: 333 pg/mL (ref 200–1100)

## 2017-12-02 LAB — PTH, INTACT AND CALCIUM
Calcium: 10 mg/dL (ref 8.6–10.2)
PTH: 63 pg/mL (ref 14–64)

## 2017-12-02 LAB — TSH: TSH: 1.53 mIU/L

## 2017-12-02 LAB — VITAMIN D 25 HYDROXY (VIT D DEFICIENCY, FRACTURES): VIT D 25 HYDROXY: 35 ng/mL (ref 30–100)

## 2017-12-03 ENCOUNTER — Encounter: Payer: Self-pay | Admitting: Physician Assistant

## 2017-12-03 DIAGNOSIS — K589 Irritable bowel syndrome without diarrhea: Secondary | ICD-10-CM | POA: Insufficient documentation

## 2017-12-03 NOTE — Progress Notes (Signed)
Subjective:    Patient ID: Sarah Mayer, female    DOB: [birth year], 47 y.o.   MRN: [id number]  HPI Pt is a 47 yo obese female who presents to the clinic after she feel yesterday and hit her eye on the door handle to her closet. Her right eye is very swollen and tender. She has some right to middle forehead tenderness and bruising. No vision changes. She does have a headache. No n/v.   She was given levbid and works well for IBS but too expensive. Would like to try something cheaper.   emgality has not really decreased headaches. She wonders if there is another injection to try for migraines.   She never got labs that were ordered to follow up on b12/vitamin D. Would like reordered. Notice more hair loss over past few months. She has had to come off a lot of medications due to cost and insurance changes.   She does mention that she needs another rheumatologist. The two sent either would not take her or she had been to and did not like.  .. Active Ambulatory Problems    Diagnosis Date Noted  . Psoriatic arthritis (HCC) 03/24/2016  . Gastroesophageal reflux disease 03/24/2016  . GAD (generalized anxiety disorder) 03/24/2016  . Iron deficiency anemia 03/24/2016  . History of pulmonary embolism 03/27/2016  . History of DVT (deep vein thrombosis) 03/27/2016  . Chronic tension-type headache, not intractable 03/27/2016  . Primary insomnia 03/27/2016  . Bilateral lower extremity edema 03/27/2016  . Chronic pain syndrome 05/25/2016  . Morbidly obese (HCC) 05/27/2016  . Serum calcium elevated 08/30/2016  . Diarrhea 08/30/2016  . Collagenous colitis 10/18/2016  . Barrett's esophagus without dysplasia 10/18/2016  . Arthralgia 10/18/2016  . Rash and nonspecific skin eruption 10/18/2016  . Memory changes 11/23/2016  . Major depression, recurrent, chronic (HCC) 11/23/2016  . OSA (obstructive sleep apnea) 11/23/2016  . Twitching 12/22/2016  . Cough 12/22/2016  . Bilateral hip pain  12/22/2016  . Tripping over things 01/12/2017  . Left arm pain 01/12/2017  . Left elbow pain 01/12/2017  . Numbness and tingling in left arm 01/17/2017  . Word finding difficulty 03/15/2017  . Intractable migraine without aura and with status migrainosus 03/15/2017  . Edema of both feet 07/04/2017  . B12 deficiency 07/04/2017  . Acquired hypothyroidism 07/04/2017  . Vitamin D deficiency 07/04/2017  . Secondary hyperparathyroidism, non-renal (HCC) 07/04/2017  . Psoriasis 07/06/2017  . Chronic fatigue 07/06/2017  . Easy bruising 07/26/2017  . Myofascial pain 08/19/2017  . Itching 08/19/2017  . Yeast infection 08/19/2017  . IBS (irritable bowel syndrome) 12/03/2017   Resolved Ambulatory Problems    Diagnosis Date Noted  . Hyperparathyroidism, primary (HCC) 09/18/2016   Past Medical History:  Diagnosis Date  . Microscopic colitis   . Migraine   . Morbid obesity (HCC) 05/27/2016    Review of Systems See HPI.     Objective:   Physical Exam  Constitutional: She is oriented to person, place, and time. She appears well-developed and well-nourished.  Obese. In wheelchair.   HENT:  Head: Normocephalic and atraumatic.  Right Ear: External ear normal.  Left Ear: External ear normal.  Eyes: Pupils are equal, round, and reactive to light. EOM are normal.  Right upper eyelid bruised and swollen. Tender to touch over eyelid and into right forehead and nasal bridge.  Conjunctiva is white no hemorrhage.   Cardiovascular: Normal rate and regular rhythm.  Pulmonary/Chest: Effort normal and breath sounds normal.  Lymphadenopathy:  She has no cervical adenopathy.  Neurological: She is alert and oriented to person, place, and time.  Psychiatric: She has a normal mood and affect. Her behavior is normal.          Assessment & Plan:  Marland KitchenMarland KitchenDiagnoses and all orders for this visit:  Acute post-traumatic headache, not intractable -     CT Maxillofacial WO CM -      HYDROcodone-acetaminophen (NORCO/VICODIN) 5-325 MG tablet; Take 1-2 tablets by mouth every 6 (six) hours as needed for moderate pain.  Serum calcium elevated -     PTH, Intact and Calcium  Vitamin D deficiency -     VITAMIN D 25 Hydroxy (Vit-D Deficiency, Fractures)  Hair loss -     TSH -     B12 and Folate Panel -     CBC with Differential/Platelet  Myofascial pain  Intractable migraine without aura and with status migrainosus -     Erenumab-aooe (AIMOVIG) 140 MG/ML SOAJ; Inject 1 pen into the skin every 30 (thirty) days.  Irritable bowel syndrome with diarrhea -     dicyclomine (BENTYL) 10 MG capsule; Take 1 capsule (10 mg total) by mouth 3 (three) times daily before meals.  Secondary hyperparathyroidism, non-renal (HCC)  Other orders -     levocetirizine (XYZAL) 5 MG tablet; Take 1 tablet (5 mg total) by mouth every evening.   CT to rule out fracture due to acute trauma. Discussed will take some time to heal and likely has a mild concussion. HO given. Encouraged ice and brain rest for next few days.  STAT CT negative for fractures.   Replaced levbid with bentyl.   Labs rechecked. Discussed hair loss and medication/stress induced.   Replaced emgality with emgality.   Needs allergy medication that is prescription not OTc.

## 2017-12-03 NOTE — Progress Notes (Signed)
PTH is great. Vitamin d in normal range but on low normal. Certainly continue supplementation at least 2000 units daily.  Thyroid is PERFECT. B12 low normal. What supplementation are you doing with this?

## 2017-12-09 ENCOUNTER — Encounter: Payer: Self-pay | Admitting: Physician Assistant

## 2017-12-09 ENCOUNTER — Telehealth: Payer: Self-pay | Admitting: Family Medicine

## 2017-12-09 NOTE — Telephone Encounter (Signed)
Received fax from [employer] that [employer] requires a PA. Information has been sent to the insurance company. Awaiting determination.   

## 2017-12-14 ENCOUNTER — Ambulatory Visit: Payer: PRIVATE HEALTH INSURANCE | Admitting: Physician Assistant

## 2017-12-14 NOTE — Telephone Encounter (Signed)
Approvedon October 18 for [employer] Request Reference Number: [id number]. [employer] INJ 140MG /ML is approved through 06/10/2018. For further questions, call [phone].

## 2017-12-15 ENCOUNTER — Encounter: Payer: Self-pay | Admitting: Family Medicine

## 2017-12-15 ENCOUNTER — Ambulatory Visit: Payer: PRIVATE HEALTH INSURANCE | Admitting: Physician Assistant

## 2017-12-15 ENCOUNTER — Ambulatory Visit (INDEPENDENT_AMBULATORY_CARE_PROVIDER_SITE_OTHER): Payer: PRIVATE HEALTH INSURANCE | Admitting: Family Medicine

## 2017-12-15 VITALS — BP 123/84 | HR 96 | Ht 64.0 in

## 2017-12-15 DIAGNOSIS — R21 Rash and other nonspecific skin eruption: Secondary | ICD-10-CM

## 2017-12-15 DIAGNOSIS — M25512 Pain in left shoulder: Secondary | ICD-10-CM | POA: Diagnosis not present

## 2017-12-15 DIAGNOSIS — L4050 Arthropathic psoriasis, unspecified: Secondary | ICD-10-CM | POA: Diagnosis not present

## 2017-12-15 MED ORDER — PREDNISONE 5 MG (48) PO TBPK: ORAL_TABLET | ORAL | 0 refills | Status: DC

## 2017-12-15 MED ORDER — MUPIROCIN 2 % EX OINT: TOPICAL_OINTMENT | CUTANEOUS | 3 refills | Status: DC

## 2017-12-15 NOTE — Patient Instructions (Addendum)
Thank you for coming in today. I am worried about a rotator cuff tendonitis.  Work on strength and stretch for the shoulder.  We will do a lab test for the rash.  Apply the ointment to the rash.   Take prednisone for flair and shoulder.   Recheck if not improving.

## 2017-12-15 NOTE — Progress Notes (Signed)
Sarah Mayer is a 47 y.o. female who presents to [facility] today for rash left arm.  Jericho notes a rash on her left arm.  Initially it was macular and erythematous and then developed blistering and is slowly improving.  She notes is not been very itchy or painful.  She has not had much treatment tried yet.  No fevers or chills.  No new medications.  She has done some reading and is worried this may be shingles.  She thinks she did have chickenpox as a child.  She notes her sister has never had chickenpox.  Additionally she notes left shoulder pain.  She fell somewhat on October 8 and developed shoulder pain on October 12.  She denies immediate pain or swelling after the fall.  She notes pain worse with overhead motion and reaching back.  She denies any radiating pain weakness or numbness.  Pain is slowly improving.  She has not tried much treatment yet.  Lastly she notes some right hand stiffness and swelling consistent with psoriatic arthritis.  As noted previously she is off current treatment in the process of being reestablished with rheumatologist.   ROS as above:  Exam:  BP 123/84   Pulse 96   Ht 5\' 4"  (1.626 m)   BMI 58.19 kg/m  Wt Readings from Last 5 Encounters:  09/16/17 (!) 339 lb (153.8 kg)  07/02/17 (!) 327 lb 14.4 oz (148.7 kg)  06/25/17 (!) 329 lb (149.2 kg)  04/12/17 (!) 343 lb (155.6 kg)  03/17/17 (!) 349 lb (158.3 kg)    Gen: Well NAD HEENT: EOMI,  MMM Lungs: Normal work of breathing. CTABL Heart: RRR no MRG Abd: NABS, Soft. Nondistended, Nontender Exts: Brisk capillary refill, warm and well perfused.  Left shoulder normal-appearing nontender normal motion pain with abduction. Positive Hawkins and Neer's test. Positive empty can test. Shoulder strength 4/5 to abduction otherwise normal.  Contralateral right shoulder nontender normal  motion normal strength negative impingement testing.  Skin: Small area of irritation and slight ulceration are healing blister left forearm.  Right hand normal-appearing nontender decreased flexion at the MCPs.       Assessment and Plan: 47 y.o. female with  Rash left arm unclear etiology.  Reasonable to assess for shingles with PCR of 1 of the small blister still remaining contents.  She is outside of treatment window tract.  Watchful waiting.  Reasonable to apply topical mupirocin antibiotic ointment as well.  Left shoulder pain: Likely rotator cuff tendinitis.  Patient does have intact preserved strength therefore I think full-thickness rotator cuff tear very unlikely.  Plan for trial of home exercise program and recheck if not improving.  Right hand stiffness: Likely psoriatic arthritis exacerbation.  Short tapering course of prednisone.   Orders Placed This Encounter  Procedures  . Varicella-zoster by PCR    Left forearm rash blister   Meds ordered this encounter  Medications  . predniSONE (STERAPRED UNI-PAK 48 TAB) 5 MG (48) TBPK tablet    Sig: 12 day dosepack po    Dispense:  48 tablet    Refill:  0  . mupirocin ointment (BACTROBAN) 2 %    Sig: Apply to affected area BID for 7 days.    Dispense:  30 g    Refill:  3     Historical information moved to improve visibility of documentation.  Past Medical History:  Diagnosis Date  . Chronic pain syndrome 05/25/2016  .  Gastroesophageal reflux disease 03/24/2016  . History of DVT (deep vein thrombosis) 03/27/2016  . History of pulmonary embolism 03/27/2016  . IBS (irritable bowel syndrome)   . Microscopic colitis   . Migraine   . Morbid obesity (HCC) 05/27/2016  . Psoriatic arthritis [facility])    Past Surgical History:  Procedure Laterality Date  . COLONOSCOPY W/ BIOPSIES    . ESOPHAGOGASTRODUODENOSCOPY     Social History   Tobacco Use  . Smoking status: Never Smoker  . Smokeless tobacco: Never Used  Substance Use  Topics  . Alcohol use: No   family history is not on file.  Medications: Current Outpatient Medications  Medication Sig Dispense Refill  . albuterol (PROVENTIL HFA;VENTOLIN HFA) 108 (90 Base) MCG/ACT inhaler Inhale 2 puffs into the lungs every 6 (six) hours as needed for wheezing or shortness of breath. 1 Inhaler 2  . AMBULATORY NON FORMULARY MEDICATION Ultra Fine  6mm pen needles to be used with Saxenda 100 each 1  . AMBULATORY NON FORMULARY MEDICATION Power wheelchair due to not being able to ambulate. 1 Device 0  . amitriptyline (ELAVIL) 50 MG tablet Take 1 tablet (50 mg total) by mouth at bedtime. 30 tablet 1  . augmented betamethasone dipropionate (DIPROLENE-AF) 0.05 % ointment     . butalbital-acetaminophen-caffeine (FIORICET WITH CODEINE) 50-325-40-30 MG capsule TAKE 1 CAPSULE BY MOUTH EVERY 4 HOURS AS NEEDED 30 capsule 1  . cyclobenzaprine (FLEXERIL) 10 MG tablet Take 5-10 mg by mouth.    . diazepam (VALIUM) 5 MG tablet Take 1 tab PO 2 hours before procedure or imaging. 2 tablet 0  . diclofenac sodium (VOLTAREN) 1 % GEL APPLY 4 GRAMS TOPICALLY FOUR (4) TIMES A DAY.  3  . dicyclomine (BENTYL) 10 MG capsule Take 1 capsule (10 mg total) by mouth 3 (three) times daily before meals. 90 capsule 5  . EMGALITY 120 MG/ML SOAJ INJECT 120 MG UNDER THE SKIN EVERY 30 DAYS 1 mL 0  . Erenumab-aooe (AIMOVIG) 140 MG/ML SOAJ Inject 1 pen into the skin every 30 (thirty) days. 1 pen 2  . fexofenadine (ALLEGRA) 180 MG tablet Take 180 mg by mouth.    . fluocinonide cream (LIDEX) 0.05 % Apply topically 2 (two) times daily. 60 g 2  . furosemide (LASIX) 20 MG tablet TAKE 1 TABLET(20 MG) BY MOUTH DAILY 90 tablet 0  . gabapentin (NEURONTIN) 300 MG capsule Take 600 mg by mouth.    . gabapentin (NEURONTIN) 600 MG tablet One tabs PO TID 180 tablet 1  . HYDROcodone-acetaminophen (NORCO/VICODIN) 5-325 MG tablet Take 1-2 tablets by mouth every 6 (six) hours as needed for moderate pain. 40 tablet 0  . hydrOXYzine  (ATARAX/VISTARIL) 50 MG tablet Take 1 tablet (50 mg total) by mouth at bedtime and may repeat dose one time if needed. For itching. 30 tablet 1  . ketoconazole (NIZORAL) 2 % cream Apply 1 application topically 2 (two) times daily. To affected areas. 60 g 1  . levocetirizine (XYZAL) 5 MG tablet Take 1 tablet (5 mg total) by mouth every evening. 90 tablet 4  . modafinil (PROVIGIL) 200 MG tablet Take 1 tablet (200 mg total) by mouth daily. 60 tablet 1  . montelukast (SINGULAIR) 10 MG tablet Take 1 tablet (10 mg total) at bedtime by mouth. 90 tablet 3  . Omega-3 Fatty Acids (FISH OIL) 1000 MG CPDR Take by mouth.    . ondansetron (ZOFRAN-ODT) 8 MG disintegrating tablet Take 1 tablet (8 mg total) by mouth every 8 (eight)  hours as needed for nausea. 30 tablet 3  . RABEprazole (ACIPHEX) 20 MG tablet Take 1 tablet (20 mg total) by mouth daily. 30 tablet 11  . ranitidine (ZANTAC) 150 MG capsule Take 150 mg by mouth 2 (two) times daily.    . Salicylic Acid 6 % SHAM     . SAXENDA 18 MG/3ML SOPN INJECT 3 MG UNDER THE SKIN DAILY 12 pen 1  . sucralfate (CARAFATE) 1 g tablet TAKE 1 TABLET(1 GRAM) BY MOUTH FOUR TIMES DAILY 120 tablet 0  . vortioxetine HBr (TRINTELLIX) 20 MG TABS tablet Take 1 tablet (20 mg total) by mouth daily. 90 tablet 0  . zolpidem (AMBIEN) 5 MG tablet TAKE 1 TABLET BY MOUTH EVERY NIGHT AT BEDTIME 30 tablet 5  . mupirocin ointment (BACTROBAN) 2 % Apply to affected area BID for 7 days. 30 g 3  . predniSONE (STERAPRED UNI-PAK 48 TAB) 5 MG (48) TBPK tablet 12 day dosepack po 48 tablet 0   No current facility-administered medications for this visit.    Allergies  Allergen Reactions  . Moxifloxacin Hcl In Nacl Rash  . Clarithromycin     Unable to remember  . Imitrex [Sumatriptan]     Dizziness/somulence/nausea  . Nsaids     IRRITATES "COLITIS"  . Topamax [Topiramate]     rash  . Moxifloxacin Rash  . Penicillins Rash  . Sulfa Antibiotics Rash  . Sumatriptan Succinate Nausea And  Vomiting    19 Jan 2011     Discussed warning signs or symptoms. Please see discharge instructions. Patient expresses understanding.

## 2017-12-16 ENCOUNTER — Encounter: Payer: Self-pay | Admitting: Family Medicine

## 2017-12-17 LAB — VARICELLA-ZOSTER BY PCR: VZV DNA, QL PCR: NOT DETECTED

## 2017-12-21 ENCOUNTER — Other Ambulatory Visit: Payer: Self-pay | Admitting: Physician Assistant

## 2017-12-21 DIAGNOSIS — M7918 Myalgia, other site: Secondary | ICD-10-CM

## 2017-12-21 DIAGNOSIS — F339 Major depressive disorder, recurrent, unspecified: Secondary | ICD-10-CM

## 2017-12-21 DIAGNOSIS — R21 Rash and other nonspecific skin eruption: Secondary | ICD-10-CM

## 2017-12-21 DIAGNOSIS — G44229 Chronic tension-type headache, not intractable: Secondary | ICD-10-CM

## 2017-12-24 ENCOUNTER — Encounter: Payer: Self-pay | Admitting: Physician Assistant

## 2017-12-28 ENCOUNTER — Ambulatory Visit: Payer: PRIVATE HEALTH INSURANCE | Admitting: Physician Assistant

## 2017-12-29 ENCOUNTER — Encounter: Payer: Self-pay | Admitting: Physician Assistant

## 2017-12-29 ENCOUNTER — Ambulatory Visit (INDEPENDENT_AMBULATORY_CARE_PROVIDER_SITE_OTHER): Payer: PRIVATE HEALTH INSURANCE | Admitting: Physician Assistant

## 2017-12-29 VITALS — BP 141/93 | HR 83 | Ht 64.0 in | Wt 339.0 lb

## 2017-12-29 DIAGNOSIS — R296 Repeated falls: Secondary | ICD-10-CM | POA: Insufficient documentation

## 2017-12-29 DIAGNOSIS — G44319 Acute post-traumatic headache, not intractable: Secondary | ICD-10-CM | POA: Diagnosis not present

## 2017-12-29 DIAGNOSIS — M25512 Pain in left shoulder: Secondary | ICD-10-CM

## 2017-12-29 DIAGNOSIS — K2270 Barrett's esophagus without dysplasia: Secondary | ICD-10-CM

## 2017-12-29 DIAGNOSIS — G9332 Myalgic encephalomyelitis/chronic fatigue syndrome: Secondary | ICD-10-CM

## 2017-12-29 DIAGNOSIS — L989 Disorder of the skin and subcutaneous tissue, unspecified: Secondary | ICD-10-CM | POA: Diagnosis not present

## 2017-12-29 DIAGNOSIS — R5382 Chronic fatigue, unspecified: Secondary | ICD-10-CM

## 2017-12-29 MED ORDER — HYDROCODONE-ACETAMINOPHEN 5-325 MG PO TABS: ORAL_TABLET | ORAL | 0 refills | Status: DC

## 2017-12-29 MED ORDER — BUPROPION HCL ER (XL) 150 MG PO TB24: 150.0000 mg | ORAL_TABLET | ORAL | 0 refills | Status: DC

## 2017-12-29 MED ORDER — RANITIDINE HCL 150 MG PO CAPS: 150.0000 mg | ORAL_CAPSULE | Freq: Two times a day (BID) | ORAL | 5 refills | Status: DC

## 2017-12-29 MED ORDER — RABEPRAZOLE SODIUM 20 MG PO TBEC: 20.0000 mg | DELAYED_RELEASE_TABLET | Freq: Every day | ORAL | 11 refills | Status: DC

## 2017-12-29 NOTE — Progress Notes (Signed)
Subjective:    Patient ID: Sarah Mayer, female    DOB: Dec 21, 1970, 47 y.o.   MRN: [id number]  HPI  Pt is a 47 yo morbidly obese female with CFS, headaches, recurrent falls, generalized skin lesions, psoriatic arthritis who presents to the clinic for follow up.   Her left shoulder is currently still bothering her a lot. 10/23 Dr. Denyse Amass saw her and suspected rotator cuff tendonitis. She has not been able to do stretches due to pain. Prednisone did not seem to help significantly. She struggles just to pull her pants up. She has psoriatic arthritis and wonder how much this is effecting her pain. Has not had treatment with biologics since may. She has new RA appt in December. She has been using diclofenac cream. She is using some norco at bedtime and really helps her to sleep would like to be able to take this at night.   She does report her recurrent falls and need for extra help at times. She has noticed that some nurses are not accommodating. She would like something in chart to note need for help. Last fall was Tuesday. No acute trauma.   She is also struggling since not able to afford pain management. She has been off all opioids since April.  Her fatigue is not any better on provigil. She wonders what the next step is.   She continues to get skin lesions on legs that are itching and create sores.   .. Active Ambulatory Problems    Diagnosis Date Noted  . Psoriatic arthritis (HCC) 03/24/2016  . Gastroesophageal reflux disease 03/24/2016  . GAD (generalized anxiety disorder) 03/24/2016  . Iron deficiency anemia 03/24/2016  . History of pulmonary embolism 03/27/2016  . History of DVT (deep vein thrombosis) 03/27/2016  . Chronic tension-type headache, not intractable 03/27/2016  . Primary insomnia 03/27/2016  . Bilateral lower extremity edema 03/27/2016  . Chronic pain syndrome 05/25/2016  . Morbidly obese (HCC) 05/27/2016  . Serum calcium elevated 08/30/2016  . Diarrhea  08/30/2016  . Collagenous colitis 10/18/2016  . Barrett's esophagus without dysplasia 10/18/2016  . Arthralgia 10/18/2016  . Rash and nonspecific skin eruption 10/18/2016  . Memory changes 11/23/2016  . Major depression, recurrent, chronic (HCC) 11/23/2016  . OSA (obstructive sleep apnea) 11/23/2016  . Twitching 12/22/2016  . Cough 12/22/2016  . Bilateral hip pain 12/22/2016  . Tripping over things 01/12/2017  . Left arm pain 01/12/2017  . Left elbow pain 01/12/2017  . Numbness and tingling in left arm 01/17/2017  . Word finding difficulty 03/15/2017  . Intractable migraine without aura and with status migrainosus 03/15/2017  . Edema of both feet 07/04/2017  . B12 deficiency 07/04/2017  . Acquired hypothyroidism 07/04/2017  . Vitamin D deficiency 07/04/2017  . Secondary hyperparathyroidism, non-renal (HCC) 07/04/2017  . Psoriasis 07/06/2017  . Chronic fatigue 07/06/2017  . Easy bruising 07/26/2017  . Myofascial pain 08/19/2017  . Itching 08/19/2017  . Yeast infection 08/19/2017  . IBS (irritable bowel syndrome) 12/03/2017  . Recurrent falls 12/29/2017   Resolved Ambulatory Problems    Diagnosis Date Noted  . Hyperparathyroidism, primary (HCC) 09/18/2016   Past Medical History:  Diagnosis Date  . Microscopic colitis   . Migraine   . Morbid obesity (HCC) 05/27/2016     Review of Systems See HPI>     Objective:   Physical Exam  Constitutional: She is oriented to person, place, and time. She appears well-developed and well-nourished.  HENT:  Head: Normocephalic and atraumatic.  Cardiovascular:  Normal rate and regular rhythm.  Pulmonary/Chest: Effort normal and breath sounds normal.  Musculoskeletal:  Limited ROM due to pain. Active ROM abduction to 110 degrees.  Tenderness over anterior shoulder to palpation.  Positive hawkins. More pain with internal rOM.  Strength 4/5 of left upper arm.    Neurological: She is alert and oriented to person, place, and time.   Skin: No rash noted.  Bilateral legs with open sores and scabs.   Healing ulceration that appears to be scabbing over on left anterior forearm.   Psychiatric: She has a normal mood and affect. Her behavior is normal.          Assessment & Plan:  Marland KitchenMarland KitchenDiagnoses and all orders for this visit:  Acute pain of left shoulder -     Ambulatory referral to Physical Therapy  Acute post-traumatic headache, not intractable -     HYDROcodone-acetaminophen (NORCO/VICODIN) 5-325 MG tablet; Take 1-2 tablets as needed at bedtime for moderate to severe pain.  Recurrent falls  Generalized skin lesions -     buPROPion (WELLBUTRIN XL) 150 MG 24 hr tablet; Take 1 tablet (150 mg total) by mouth every morning.  Chronic fatigue  Barrett's esophagus without dysplasia -     ranitidine (ZANTAC) 150 MG capsule; Take 1 capsule (150 mg total) by mouth 2 (two) times daily. -     RABEprazole (ACIPHEX) 20 MG tablet; Take 1 tablet (20 mg total) by mouth daily.  Chronic fatigue syndrome -     buPROPion (WELLBUTRIN XL) 150 MG 24 hr tablet; Take 1 tablet (150 mg total) by mouth every morning.  I do think pain will improve when she gets some relief from her autoimmune disease.   added recurrent falls to problem list and made an FYI.   For shoulder: needs PT. Ordered today. Consider difclofeanc cream. Consider injection with sports medicine and MRI if pain persists. Ok for norco at bedtime. Opioid risk tool low risk with pain contract signed. Follow up in 3 months.   For fatigue increase provigil to 2 tablets daily.   Added wellbutrin for depression and general anxiety. Perhaps if anxiety is better she will not feel the need to scratch all the time. Use bactroban for skin lesions. Continue with vistaril for itching. I really think some of lesions could be autoimmune related.   [place] controlled substance database reviewed with no concerns.

## 2017-12-29 NOTE — Patient Instructions (Signed)
Increase provigil to 2 tablets daily.

## 2018-01-02 ENCOUNTER — Encounter: Payer: Self-pay | Admitting: Physician Assistant

## 2018-01-11 ENCOUNTER — Encounter: Payer: Self-pay | Admitting: Physician Assistant

## 2018-01-19 ENCOUNTER — Other Ambulatory Visit: Payer: Self-pay | Admitting: Physician Assistant

## 2018-01-19 ENCOUNTER — Telehealth: Payer: Self-pay

## 2018-01-19 DIAGNOSIS — R21 Rash and other nonspecific skin eruption: Secondary | ICD-10-CM

## 2018-01-19 DIAGNOSIS — G44229 Chronic tension-type headache, not intractable: Secondary | ICD-10-CM

## 2018-01-19 DIAGNOSIS — M7918 Myalgia, other site: Secondary | ICD-10-CM

## 2018-01-19 NOTE — Telephone Encounter (Signed)
Received letter from pharmacy noting the Ranitidine recall.   Called and left pt msg advising that recall was made and advised her that per Lesly Rubensteinjade, she should switch to Pepcid.   Call back info left in case of any questions or concerns

## 2018-01-25 ENCOUNTER — Other Ambulatory Visit: Payer: Self-pay | Admitting: Physician Assistant

## 2018-01-25 DIAGNOSIS — R05 Cough: Secondary | ICD-10-CM

## 2018-01-25 DIAGNOSIS — R059 Cough, unspecified: Secondary | ICD-10-CM

## 2018-02-07 ENCOUNTER — Ambulatory Visit (INDEPENDENT_AMBULATORY_CARE_PROVIDER_SITE_OTHER): Payer: PRIVATE HEALTH INSURANCE | Admitting: Sports Medicine

## 2018-02-07 ENCOUNTER — Encounter: Payer: Self-pay | Admitting: Sports Medicine

## 2018-02-07 DIAGNOSIS — J209 Acute bronchitis, unspecified: Secondary | ICD-10-CM | POA: Diagnosis not present

## 2018-02-07 MED ORDER — FLUTICASONE PROPIONATE 50 MCG/ACT NA SUSP: NASAL | 3 refills | Status: DC

## 2018-02-07 MED ORDER — BENZONATATE 200 MG PO CAPS: 200.0000 mg | ORAL_CAPSULE | Freq: Three times a day (TID) | ORAL | 0 refills | Status: DC | PRN

## 2018-02-07 NOTE — Patient Instructions (Signed)

## 2018-02-07 NOTE — Assessment & Plan Note (Signed)
With significant rhinorrhea, likely postnasal drip syndrome. Only present for 3 days to 4 days now. Flonase, continue Singulair, adding Tessalon Perles. Clear lungs, return to see us if no better in a couple of weeks. No indications for antibiotics.

## 2018-02-07 NOTE — Progress Notes (Signed)
Subjective:    CC: Coughing  HPI: For the past 3 days this 47 year old female has had a mild nonproductive cough, significant runny nose, no facial or sinus pressure, fevers, chills, shortness of breath, chest pain, no GI symptoms, no skin rash.  I reviewed the past medical history, family history, social history, surgical history, and allergies today and no changes were needed.  Please see the problem list section below in epic for further details.  Past Medical History: Past Medical History:  Diagnosis Date  . Chronic pain syndrome 05/25/2016  . Gastroesophageal reflux disease 03/24/2016  . History of DVT (deep vein thrombosis) 03/27/2016  . History of pulmonary embolism 03/27/2016  . IBS (irritable bowel syndrome)   . Microscopic colitis   . Migraine   . Morbid obesity (HCC) 05/27/2016  . Psoriatic arthritis [facility])    Past Surgical History: Past Surgical History:  Procedure Laterality Date  . COLONOSCOPY W/ BIOPSIES    . ESOPHAGOGASTRODUODENOSCOPY     Social History: Social History   Socioeconomic History  . Marital status: Married    Spouse name: Not on file  . Number of children: Not on file  . Years of education: Not on file  . Highest education level: Not on file  Occupational History  . Not on file  Social Needs  . Financial resource strain: Not on file  . Food insecurity:    Worry: Not on file    Inability: Not on file  . Transportation needs:    Medical: Not on file    Non-medical: Not on file  Tobacco Use  . Smoking status: Never Smoker  . Smokeless tobacco: Never Used  Substance and Sexual Activity  . Alcohol use: No  . Drug use: No  . Sexual activity: Not Currently  Lifestyle  . Physical activity:    Days per week: Not on file    Minutes per session: Not on file  . Stress: Not on file  Relationships  . Social connections:    Talks on phone: Not on file    Gets together: Not on file    Attends religious service: Not on file    Active member of club  or organization: Not on file    Attends meetings of clubs or organizations: Not on file    Relationship status: Not on file  Other Topics Concern  . Not on file  Social History Narrative  . Not on file   Family History: No family history on file. Allergies: Allergies  Allergen Reactions  . Moxifloxacin Hcl In Nacl Rash  . Clarithromycin     Unable to remember  . Imitrex [Sumatriptan]     Dizziness/somulence/nausea  . Nsaids     IRRITATES "COLITIS"  . Topamax [Topiramate]     rash  . Moxifloxacin Rash  . Penicillins Rash  . Sulfa Antibiotics Rash  . Sumatriptan Succinate Nausea And Vomiting    19 Jan 2011   Medications: See med rec.  Review of Systems: No fevers, chills, night sweats, weight loss, chest pain, or shortness of breath.   Objective:    General: Well Developed, well nourished, and in no acute distress.  Neuro: Alert and oriented x3, extra-ocular muscles intact, sensation grossly intact.  HEENT: Normocephalic, atraumatic, pupils equal round reactive to light, neck supple, no masses, no lymphadenopathy, thyroid nonpalpable.  Oropharynx, nasopharynx, ear canals unremarkable. Skin: Warm and dry, no rashes. Cardiac: Regular rate and rhythm, no murmurs rubs or gallops, no lower extremity edema.  Respiratory: Clear  to auscultation bilaterally. Not using accessory muscles, speaking in full sentences.  Impression and Recommendations:    Acute bronchitis With significant rhinorrhea, likely postnasal drip syndrome. Only present for 3 days to 4 days now. Flonase, continue Singulair, adding Tessalon Perles. Clear lungs, return to see us if no better in a couple of weeks. No indications for antibiotics. ___________________________________________ Ihor Austinhomas J. Benjamin Stainhekkekandam, M.D., ABFM., CAQSM. Primary Care and Sports Medicine [facility]  Adjunct Professor of Family Medicine  [employer]

## 2018-02-11 ENCOUNTER — Encounter: Payer: Self-pay | Admitting: Physician Assistant

## 2018-02-11 ENCOUNTER — Ambulatory Visit (INDEPENDENT_AMBULATORY_CARE_PROVIDER_SITE_OTHER): Payer: PRIVATE HEALTH INSURANCE | Admitting: Physician Assistant

## 2018-02-11 VITALS — BP 119/89 | HR 99 | Temp 98.7°F | Ht 64.0 in | Wt 339.0 lb

## 2018-02-11 DIAGNOSIS — G9332 Myalgic encephalomyelitis/chronic fatigue syndrome: Secondary | ICD-10-CM

## 2018-02-11 DIAGNOSIS — R350 Frequency of micturition: Secondary | ICD-10-CM | POA: Diagnosis not present

## 2018-02-11 DIAGNOSIS — L4050 Arthropathic psoriasis, unspecified: Secondary | ICD-10-CM

## 2018-02-11 DIAGNOSIS — G894 Chronic pain syndrome: Secondary | ICD-10-CM | POA: Diagnosis not present

## 2018-02-11 DIAGNOSIS — R5382 Chronic fatigue, unspecified: Secondary | ICD-10-CM

## 2018-02-11 DIAGNOSIS — F339 Major depressive disorder, recurrent, unspecified: Secondary | ICD-10-CM

## 2018-02-11 DIAGNOSIS — L409 Psoriasis, unspecified: Secondary | ICD-10-CM | POA: Diagnosis not present

## 2018-02-11 DIAGNOSIS — J40 Bronchitis, not specified as acute or chronic: Secondary | ICD-10-CM

## 2018-02-11 DIAGNOSIS — K2270 Barrett's esophagus without dysplasia: Secondary | ICD-10-CM

## 2018-02-11 DIAGNOSIS — R531 Weakness: Secondary | ICD-10-CM

## 2018-02-11 LAB — POCT URINALYSIS DIPSTICK
Bilirubin, UA: NEGATIVE
GLUCOSE UA: NEGATIVE
Ketones, UA: NEGATIVE
Nitrite, UA: NEGATIVE
Protein, UA: POSITIVE — AB
Spec Grav, UA: 1.02 (ref 1.010–1.025)
Urobilinogen, UA: 0.2 E.U./dL
pH, UA: 6 (ref 5.0–8.0)

## 2018-02-11 MED ORDER — CLOBETASOL PROPIONATE 0.05 % EX FOAM: Freq: Two times a day (BID) | CUTANEOUS | 5 refills | Status: DC

## 2018-02-11 MED ORDER — HYDROCOD POLST-CPM POLST ER 10-8 MG/5ML PO SUER: 5.0000 mL | Freq: Two times a day (BID) | ORAL | 0 refills | Status: DC | PRN

## 2018-02-11 MED ORDER — DICLOFENAC SODIUM 1 % TD GEL: TRANSDERMAL | 3 refills | Status: DC

## 2018-02-11 MED ORDER — RABEPRAZOLE SODIUM 20 MG PO TBEC: 20.0000 mg | DELAYED_RELEASE_TABLET | Freq: Every day | ORAL | 11 refills | Status: DC

## 2018-02-11 MED ORDER — PREDNISONE 20 MG PO TABS: ORAL_TABLET | ORAL | 0 refills | Status: DC

## 2018-02-11 MED ORDER — ONDANSETRON 8 MG PO TBDP: 8.0000 mg | ORAL_TABLET | Freq: Three times a day (TID) | ORAL | 1 refills | Status: DC | PRN

## 2018-02-11 MED ORDER — DOXYCYCLINE HYCLATE 100 MG PO TABS: 100.0000 mg | ORAL_TABLET | Freq: Two times a day (BID) | ORAL | 0 refills | Status: DC

## 2018-02-11 MED ORDER — AMPHETAMINE-DEXTROAMPHET ER 20 MG PO CP24: 20.0000 mg | ORAL_CAPSULE | ORAL | 0 refills | Status: DC

## 2018-02-11 NOTE — Progress Notes (Signed)
Go ahead and start doxycycline give could work for both lower and upper infections.

## 2018-02-11 NOTE — Progress Notes (Signed)
Subjective:    Patient ID: Sarah Mayer, female    DOB: October 08, 1970, 47 y.o.   MRN: [id number]  HPI  Pt is a 47 yo morbidly obese female who presents to the clinic with multiple concerns.   She is worried about not being able to afford rheumatology and wonders if I can manage. She has tried humiria/stelara/symponi  She was having a lot of problems making it to the bathroom to urinate last week without leaking some. No fever, chills, dysuria, flank pain.   She was seen earlier this week for bronchitis. She is worsening. Her sputum is now more productive and she has more sinus pressure and ear pain. She is using albuterol inhaler. She does feel like her chest is tight. Coughing is keeping her up at night.   She is very tired. This is ongoing. provigil was tried with no benefit. As she does less and less she is getting weaker and weaker. PT was prescribed but not able to afford any more.    .. Active Ambulatory Problems    Diagnosis Date Noted  . Psoriatic arthritis (HCC) 03/24/2016  . Gastroesophageal reflux disease 03/24/2016  . GAD (generalized anxiety disorder) 03/24/2016  . Iron deficiency anemia 03/24/2016  . History of pulmonary embolism 03/27/2016  . History of DVT (deep vein thrombosis) 03/27/2016  . Chronic tension-type headache, not intractable 03/27/2016  . Primary insomnia 03/27/2016  . Bilateral lower extremity edema 03/27/2016  . Chronic pain syndrome 05/25/2016  . Morbidly obese (HCC) 05/27/2016  . Serum calcium elevated 08/30/2016  . Diarrhea 08/30/2016  . Collagenous colitis 10/18/2016  . Barrett's esophagus without dysplasia 10/18/2016  . Arthralgia 10/18/2016  . Rash and nonspecific skin eruption 10/18/2016  . Memory changes 11/23/2016  . Major depression, recurrent, chronic (HCC) 11/23/2016  . OSA (obstructive sleep apnea) 11/23/2016  . Twitching 12/22/2016  . Cough 12/22/2016  . Bilateral hip pain 12/22/2016  . Tripping over things 01/12/2017  . Left  arm pain 01/12/2017  . Left elbow pain 01/12/2017  . Numbness and tingling in left arm 01/17/2017  . Word finding difficulty 03/15/2017  . Intractable migraine without aura and with status migrainosus 03/15/2017  . Edema of both feet 07/04/2017  . B12 deficiency 07/04/2017  . Acquired hypothyroidism 07/04/2017  . Vitamin D deficiency 07/04/2017  . Secondary hyperparathyroidism, non-renal (HCC) 07/04/2017  . Psoriasis 07/06/2017  . Chronic fatigue 07/06/2017  . Easy bruising 07/26/2017  . Myofascial pain 08/19/2017  . Itching 08/19/2017  . Yeast infection 08/19/2017  . IBS (irritable bowel syndrome) 12/03/2017  . Recurrent falls 12/29/2017  . Acute bronchitis 02/07/2018  . Weakness 02/11/2018  . 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 Urinary frequency 02/14/2018   Resolved Ambulatory Problems    Diagnosis Date Noted  . Hyperparathyroidism, primary (HCC) 09/18/2016   Past Medical History:  Diagnosis Date  . Microscopic colitis   . Migraine   . Morbid obesity (HCC) 05/27/2016      Review of Systems See HPI.     Objective:   Physical Exam Vitals signs reviewed.  Constitutional:      Appearance: Normal appearance.     Comments: Non ambulatory in wheelchair.   HENT:     Head: Normocephalic and atraumatic.  Cardiovascular:     Rate and Rhythm: Normal rate and regular rhythm.  Pulmonary:     Effort: Pulmonary effort is normal.     Breath sounds: Normal breath sounds.  Neurological:     General: No focal deficit present.  Mental Status: She is alert and oriented to person, place, and time.  Psychiatric:        Mood and Affect: Mood normal.        Behavior: Behavior normal.           Assessment & Plan:  [name].[name].[name] was seen today for follow-up.  Diagnoses and all orders for this visit:  Scalp psoriasis -     clobetasol (OLUX) 0.05 % topical foam; Apply topically 2 (two) times daily.  Psoriatic arthritis (HCC)  Chronic pain syndrome -     diclofenac sodium  (VOLTAREN) 1 % GEL; APPLY 4 GRAMS TOPICALLY FOUR (4) TIMES A DAY.  Major depression, recurrent, chronic (HCC)  Barrett's esophagus without dysplasia -     RABEprazole (ACIPHEX) 20 MG tablet; Take 1 tablet (20 mg total) by mouth daily.  Weakness  Urinary frequency -     POCT Urinalysis Dipstick -     Urine Culture  Bronchitis -     predniSONE (DELTASONE) 20 MG tablet; Take one tablet twice a day. -     doxycycline (VIBRA-TABS) 100 MG tablet; Take 1 tablet (100 mg total) by mouth 2 (two) times daily. -     chlorpheniramine-HYDROcodone (TUSSIONEX) 10-8 MG/5ML SUER; Take 5 mLs by mouth every 12 (twelve) hours as needed for cough (cough, will cause drowsiness.).  Chronic fatigue syndrome -     amphetamine-dextroamphetamine (ADDERALL XR) 20 MG 24 hr capsule; Take 1 capsule (20 mg total) by mouth every morning.  Other orders -     ondansetron (ZOFRAN-ODT) 8 MG disintegrating tablet; Take 1 tablet (8 mg total) by mouth every 8 (eight) hours as needed for nausea.   Explained to her I can not manage her Rheumatology needs. She needs a biologic and I do not prescribe those. Not being on a biologic is causing her scalp psorasis to act up. Added olux.   Bronchitis is worsening and having some sinus symptoms as well. Discussed to start prednisone and if not improving in 2-3 days can start doxycycline. tussinonex for cough only at bedtime. Explained virus can take time to resolve.   .. Results for orders placed or performed in visit on 02/11/18  POCT Urinalysis Dipstick  Result Value Ref Range   Color, UA dark yellow    Clarity, UA cloudy    Glucose, UA Negative Negative   Bilirubin, UA negative    Ketones, UA negative    Spec Grav, UA 1.020 1.010 - 1.025   Blood, UA trace-lysed    pH, UA 6.0 5.0 - 8.0   Protein, UA Positive (A) Negative   Urobilinogen, UA 0.2 0.2 or 1.0 E.U./dL   Nitrite, UA negative    Leukocytes, UA Moderate (2+) (A) Negative   Appearance     Odor     Not having  UA symptoms currently. Will culture.   Stop provigil. Start adderall to fatigue. Discussed side effects. Follow up in 1 month.

## 2018-02-13 ENCOUNTER — Other Ambulatory Visit: Payer: Self-pay | Admitting: Physician Assistant

## 2018-02-13 DIAGNOSIS — R5382 Chronic fatigue, unspecified: Secondary | ICD-10-CM

## 2018-02-13 DIAGNOSIS — G44229 Chronic tension-type headache, not intractable: Secondary | ICD-10-CM

## 2018-02-13 DIAGNOSIS — R21 Rash and other nonspecific skin eruption: Secondary | ICD-10-CM

## 2018-02-13 DIAGNOSIS — M7918 Myalgia, other site: Secondary | ICD-10-CM

## 2018-02-14 ENCOUNTER — Encounter: Payer: Self-pay | Admitting: Physician Assistant

## 2018-02-14 ENCOUNTER — Other Ambulatory Visit: Payer: Self-pay

## 2018-02-14 DIAGNOSIS — R350 Frequency of micturition: Secondary | ICD-10-CM | POA: Insufficient documentation

## 2018-02-14 DIAGNOSIS — L409 Psoriasis, unspecified: Secondary | ICD-10-CM | POA: Insufficient documentation

## 2018-02-14 DIAGNOSIS — R5382 Chronic fatigue, unspecified: Secondary | ICD-10-CM

## 2018-02-14 LAB — URINE CULTURE
MICRO NUMBER:: 91526974
SPECIMEN QUALITY:: ADEQUATE

## 2018-02-14 NOTE — Progress Notes (Signed)
Urine culture confirmed UTI. Doxycycline should work to treat that was already given.

## 2018-02-14 NOTE — Progress Notes (Signed)
Medication was discontinued at last visit

## 2018-02-17 ENCOUNTER — Encounter: Payer: Self-pay | Admitting: Physician Assistant

## 2018-02-17 DIAGNOSIS — K2270 Barrett's esophagus without dysplasia: Secondary | ICD-10-CM

## 2018-02-18 MED ORDER — RABEPRAZOLE SODIUM 20 MG PO TBEC: 20.0000 mg | DELAYED_RELEASE_TABLET | Freq: Two times a day (BID) | ORAL | 11 refills | Status: DC

## 2018-02-21 ENCOUNTER — Telehealth: Payer: Self-pay | Admitting: Physician Assistant

## 2018-02-21 ENCOUNTER — Ambulatory Visit: Payer: PRIVATE HEALTH INSURANCE | Admitting: Sports Medicine

## 2018-02-21 NOTE — Telephone Encounter (Signed)
Received fax from [employer] that Aciphex requires a PA. Information has been sent to the insurance company. Awaiting determination.

## 2018-02-22 NOTE — Telephone Encounter (Signed)
Received fax from [employer] that Aciphex was approved from 02/21/2018 through 02/22/2019.  Pharmacy notified and form sent to scan.

## 2018-03-03 ENCOUNTER — Other Ambulatory Visit: Payer: Self-pay | Admitting: Physician Assistant

## 2018-03-10 ENCOUNTER — Other Ambulatory Visit: Payer: Self-pay | Admitting: Physician Assistant

## 2018-03-10 DIAGNOSIS — F339 Major depressive disorder, recurrent, unspecified: Secondary | ICD-10-CM

## 2018-03-10 DIAGNOSIS — R21 Rash and other nonspecific skin eruption: Secondary | ICD-10-CM

## 2018-03-10 DIAGNOSIS — M7918 Myalgia, other site: Secondary | ICD-10-CM

## 2018-03-10 DIAGNOSIS — G44229 Chronic tension-type headache, not intractable: Secondary | ICD-10-CM

## 2018-03-11 ENCOUNTER — Encounter: Payer: Self-pay | Admitting: Physician Assistant

## 2018-03-11 ENCOUNTER — Ambulatory Visit (INDEPENDENT_AMBULATORY_CARE_PROVIDER_SITE_OTHER): Payer: PRIVATE HEALTH INSURANCE | Admitting: Physician Assistant

## 2018-03-11 VITALS — BP 105/69 | HR 92

## 2018-03-11 DIAGNOSIS — R5382 Chronic fatigue, unspecified: Secondary | ICD-10-CM

## 2018-03-11 DIAGNOSIS — G44319 Acute post-traumatic headache, not intractable: Secondary | ICD-10-CM | POA: Diagnosis not present

## 2018-03-11 DIAGNOSIS — F339 Major depressive disorder, recurrent, unspecified: Secondary | ICD-10-CM | POA: Diagnosis not present

## 2018-03-11 DIAGNOSIS — G8929 Other chronic pain: Secondary | ICD-10-CM

## 2018-03-11 DIAGNOSIS — J40 Bronchitis, not specified as acute or chronic: Secondary | ICD-10-CM

## 2018-03-11 DIAGNOSIS — M79645 Pain in left finger(s): Secondary | ICD-10-CM

## 2018-03-11 DIAGNOSIS — G9332 Myalgic encephalomyelitis/chronic fatigue syndrome: Secondary | ICD-10-CM

## 2018-03-11 DIAGNOSIS — M25512 Pain in left shoulder: Secondary | ICD-10-CM

## 2018-03-11 DIAGNOSIS — R05 Cough: Secondary | ICD-10-CM | POA: Diagnosis not present

## 2018-03-11 DIAGNOSIS — R058 Other specified cough: Secondary | ICD-10-CM

## 2018-03-11 MED ORDER — AMITRIPTYLINE HCL 100 MG PO TABS: 100.0000 mg | ORAL_TABLET | Freq: Every day | ORAL | 1 refills | Status: DC

## 2018-03-11 MED ORDER — PREDNISONE 20 MG PO TABS: ORAL_TABLET | ORAL | 0 refills | Status: DC

## 2018-03-11 MED ORDER — HYDROCODONE-ACETAMINOPHEN 5-325 MG PO TABS: ORAL_TABLET | ORAL | 0 refills | Status: DC

## 2018-03-11 MED ORDER — AMPHETAMINE-DEXTROAMPHET ER 20 MG PO CP24: 20.0000 mg | ORAL_CAPSULE | ORAL | 0 refills | Status: DC

## 2018-03-11 MED ORDER — HYDROCOD POLST-CPM POLST ER 10-8 MG/5ML PO SUER: 5.0000 mL | Freq: Two times a day (BID) | ORAL | 0 refills | Status: DC | PRN

## 2018-03-11 MED ORDER — BUPROPION HCL ER (XL) 150 MG PO TB24: 150.0000 mg | ORAL_TABLET | ORAL | 3 refills | Status: DC

## 2018-03-11 MED ORDER — VORTIOXETINE HBR 20 MG PO TABS: ORAL_TABLET | ORAL | 3 refills | Status: DC

## 2018-03-11 NOTE — Progress Notes (Signed)
Subjective:    Patient ID: Sarah Mayer, female    DOB: 25-Dec-1970, 48 y.o.   MRN: [id number]  HPI  Pt is a 48 yo obese female who presents to the clinic to follow up on cough and pain.   Pt reports to "do this every year. She has a cough that will not go away". She has had abx, inhaler, mucinex. Cough is mostly dry but will at times get some stuff up. Worse when she lays down. On xyzal and singular. She does feel tight in chest but no wheezing. No fever or chills.   She has not been back to rheumatology due to time and money. She is not on anything for psoratic arthritis.   She continues to have chronic and intermittent left thumb swelling and pain as well as left shoulder. No known trauma. voltaren gel does help.   Last visit for CFS adderall was sent. Insurance will not pay for brand needs generic. She is complely exhausted no matter what she does, the day, the time.   .. Active Ambulatory Problems    Diagnosis Date Noted  . Psoriatic arthritis (HCC) 03/24/2016  . Gastroesophageal reflux disease 03/24/2016  . GAD (generalized anxiety disorder) 03/24/2016  . Iron deficiency anemia 03/24/2016  . History of pulmonary embolism 03/27/2016  . History of DVT (deep vein thrombosis) 03/27/2016  . Chronic tension-type headache, not intractable 03/27/2016  . Primary insomnia 03/27/2016  . Bilateral lower extremity edema 03/27/2016  . Chronic pain syndrome 05/25/2016  . Morbidly obese (HCC) 05/27/2016  . Serum calcium elevated 08/30/2016  . Diarrhea 08/30/2016  . Collagenous colitis 10/18/2016  . Barrett's esophagus without dysplasia 10/18/2016  . Arthralgia 10/18/2016  . Rash and nonspecific skin eruption 10/18/2016  . Memory changes 11/23/2016  . Major depression, recurrent, chronic (HCC) 11/23/2016  . OSA (obstructive sleep apnea) 11/23/2016  . Twitching 12/22/2016  . Cough 12/22/2016  . Bilateral hip pain 12/22/2016  . Tripping over things 01/12/2017  . Left arm pain  01/12/2017  . Left elbow pain 01/12/2017  . Numbness and tingling in left arm 01/17/2017  . Word finding difficulty 03/15/2017  . Intractable migraine without aura and with status migrainosus 03/15/2017  . Edema of both feet 07/04/2017  . B12 deficiency 07/04/2017  . Acquired hypothyroidism 07/04/2017  . Vitamin D deficiency 07/04/2017  . Secondary hyperparathyroidism, non-renal (HCC) 07/04/2017  . Psoriasis 07/06/2017  . Chronic fatigue 07/06/2017  . Easy bruising 07/26/2017  . Myofascial pain 08/19/2017  . Itching 08/19/2017  . Yeast infection 08/19/2017  . IBS (irritable bowel syndrome) 12/03/2017  . Recurrent falls 12/29/2017  . Acute bronchitis 02/07/2018  . Weakness 02/11/2018  . 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 Urinary frequency 02/14/2018  . Chronic left shoulder pain 03/15/2018  . Chronic pain of left thumb 03/15/2018   Resolved Ambulatory Problems    Diagnosis Date Noted  . Hyperparathyroidism, primary (HCC) 09/18/2016   Past Medical History:  Diagnosis Date  . Microscopic colitis   . Migraine   . Morbid obesity (HCC) 05/27/2016      Review of Systems See HPI.     Objective:   Physical Exam Vitals signs reviewed.  Constitutional:      Appearance: Normal appearance. She is obese.     Comments: In a wheelchair.   HENT:     Head: Normocephalic and atraumatic.     Mouth/Throat:     Mouth: Mucous membranes are moist.     Pharynx: No oropharyngeal exudate or posterior oropharyngeal  erythema.  Cardiovascular:     Rate and Rhythm: Normal rate and regular rhythm.     Pulses: Normal pulses.  Pulmonary:     Effort: Pulmonary effort is normal.     Breath sounds: Normal breath sounds. No wheezing or rhonchi.  Musculoskeletal:     Comments: Swollen right thumb tender to touch. No warmth.   Left shoulder active/passive ROM normal with some pain with external ROM.  No pain with palpation over bicep tendon insertion. Pain with yergason and hawkins.   Neurological:      General: No focal deficit present.     Mental Status: She is alert and oriented to person, place, and time.  Psychiatric:        Mood and Affect: Mood normal.           Assessment & Plan:  Sarah KitchenMarland KitchenAmiee was seen today for follow-up.  Diagnoses and all orders for this visit:  Post-viral cough syndrome -     predniSONE (DELTASONE) 20 MG tablet; Take 3 tablets for 3 days, take 2 tablets for 3 days, take 1 tablet for 3 days, 1/2 tablet for 4 days. -     amitriptyline (ELAVIL) 100 MG tablet; Take 1 tablet (100 mg total) by mouth at bedtime. -     chlorpheniramine-HYDROcodone (TUSSIONEX) 10-8 MG/5ML SUER; Take 5 mLs by mouth every 12 (twelve) hours as needed for cough (cough, will cause drowsiness.).  Major depression, recurrent, chronic (HCC) -     amitriptyline (ELAVIL) 100 MG tablet; Take 1 tablet (100 mg total) by mouth at bedtime. -     vortioxetine HBr (TRINTELLIX) 20 MG TABS tablet; TAKE 1 TABLET(20 MG) BY MOUTH DAILY  Bronchitis -     chlorpheniramine-HYDROcodone (TUSSIONEX) 10-8 MG/5ML SUER; Take 5 mLs by mouth every 12 (twelve) hours as needed for cough (cough, will cause drowsiness.).  Acute post-traumatic headache, not intractable -     amitriptyline (ELAVIL) 100 MG tablet; Take 1 tablet (100 mg total) by mouth at bedtime.  CFS (chronic fatigue syndrome) -     buPROPion (WELLBUTRIN XL) 150 MG 24 hr tablet; Take 1 tablet (150 mg total) by mouth every morning. -     amphetamine-dextroamphetamine (ADDERALL XR) 20 MG 24 hr capsule; Take 1 capsule (20 mg total) by mouth every morning.  Chronic pain of left thumb -     HYDROcodone-acetaminophen (NORCO/VICODIN) 5-325 MG tablet; Take 1-2 tablets as needed at bedtime for moderate to severe pain.  Chronic left shoulder pain   Longer prednisone taper for cough given. Increased elavil to 100mg . tussionex only for bedtime for the next few days.   Suggest thumb spica for left thumb when flares. Ice as needed and consider voltaren  gel.   I continue to feel like left shoulder pain could be some of her psoratic arthritis flaring and possible bursitis/tendonitis she also had some positive findings for even bicep tendonitis. Pt declined PT. Continue using voltaren. A few norco given for severe pain. MUST get in with rheumatology. Consider xray or sports medicine follow up.

## 2018-03-11 NOTE — Patient Instructions (Addendum)
Increased elavil to 100mg  at bedtime.  Longer prednisone taper.  Continue tessalon pearl.  Humidifier as much as possible.    Cough, Adult  A cough helps to clear your throat and lungs. A cough may last only 2-3 weeks (acute), or it may last longer than 8 weeks (chronic). Many different things can cause a cough. A cough may be a sign of an illness or another medical condition. Follow these instructions at home:  Pay attention to any changes in your cough.  Take medicines only as told by your doctor. ? If you were prescribed an antibiotic medicine, take it as told by your doctor. Do not stop taking it even if you start to feel better. ? Talk with your doctor before you try using a cough medicine.  Drink enough fluid to keep your pee (urine) clear or pale yellow.  If the air is dry, use a cold steam vaporizer or humidifier in your home.  Stay away from things that make you cough at work or at home.  If your cough is worse at night, try using extra pillows to raise your head up higher while you sleep.  Do not smoke, and try not to be around smoke. If you need help quitting, ask your doctor.  Do not have caffeine.  Do not drink alcohol.  Rest as needed. Contact a doctor if:  You have new problems (symptoms).  You cough up yellow fluid (pus).  Your cough does not get better after 2-3 weeks, or your cough gets worse.  Medicine does not help your cough and you are not sleeping well.  You have pain that gets worse or pain that is not helped with medicine.  You have a fever.  You are losing weight and you do not know why.  You have night sweats. Get help right away if:  You cough up blood.  You have trouble breathing.  Your heartbeat is very fast. This information is not intended to replace advice given to you by your health care provider. Make sure you discuss any questions you have with your health care provider. Document Released: 10/23/2010 Document Revised:  07/18/2015 Document Reviewed: 04/18/2014 Elsevier Interactive Patient Education  2019 [employer].    Tennis Elbow Tennis elbow is swelling (inflammation) in your outer forearm, near your elbow. Swelling affects the tissues that connect muscle to bone (tendons). Tennis elbow can happen in any sport or job in which you use your elbow too much. It is caused by doing the same motion over and over. Tennis elbow can cause:  Pain and tenderness in your forearm and the outer part of your elbow. You may have pain all the time, or only when using the arm.  A burning feeling. This runs from your elbow through your arm.  Weak grip in your hand. Follow these instructions at home: Activity  Rest your elbow and wrist. Avoid activities that cause problems, as told by your doctor.  If told by your doctor, wear an elbow strap to reduce stress on the area.  Do physical therapy exercises as told.  If you lift an object, lift it with your palm facing up. This is easier on your elbow. Lifestyle  If your tennis elbow is caused by sports, check your equipment and make sure that: ? You are using it correctly. ? It fits you well.  If your tennis elbow is caused by work or by using a computer, take breaks often to stretch your arm. Talk with your  manager about how you can manage your condition at work. If you have a brace:  Wear the brace as told by your doctor. Remove it only as told by your doctor.  Loosen the brace if your fingers tingle, get numb, or turn cold and blue.  Keep the brace clean.  If the brace is not waterproof, ask your doctor if you may take the brace off for bathing. If you must keep the brace on while bathing: ? Do not let it get wet. ? Cover it with a watertight covering when you take a bath or a shower. General instructions   If told, put ice on the painful area: ? Put ice in a plastic bag. ? Place a towel between your skin and the bag. ? Leave the ice on for 20 minutes,  2-3 times a day.  Take over-the-counter and prescription medicines only as told by your doctor.  Keep all follow-up visits as told by your doctor. This is important. Contact a doctor if:  Your pain does not get better with treatment.  Your pain gets worse.  You have weakness in your forearm, hand, or fingers.  You cannot feel your forearm, hand, or fingers. Summary  Tennis elbow is swelling (inflammation) in your outer forearm, near your elbow.  Tennis elbow is caused by doing the same motion over and over.  Rest your elbow and wrist. Avoid activities that cause problems, as told by your doctor.  If told, put ice on the painful area for 20 minutes, 2-3 times a day. This information is not intended to replace advice given to you by your health care provider. Make sure you discuss any questions you have with your health care provider. Document Released: 07/30/2009 Document Revised: 11/24/2016 Document Reviewed: 11/24/2016 Elsevier Interactive Patient Education  2019 [employer]

## 2018-03-15 ENCOUNTER — Encounter: Payer: Self-pay | Admitting: Physician Assistant

## 2018-03-15 DIAGNOSIS — M79645 Pain in left finger(s): Secondary | ICD-10-CM

## 2018-03-15 DIAGNOSIS — M25512 Pain in left shoulder: Secondary | ICD-10-CM

## 2018-03-15 DIAGNOSIS — G8929 Other chronic pain: Secondary | ICD-10-CM | POA: Insufficient documentation

## 2018-03-16 ENCOUNTER — Encounter: Payer: Self-pay | Admitting: Physician Assistant

## 2018-03-17 ENCOUNTER — Encounter: Payer: Self-pay | Admitting: Physician Assistant

## 2018-03-17 DIAGNOSIS — M79645 Pain in left finger(s): Principal | ICD-10-CM

## 2018-03-17 DIAGNOSIS — G8929 Other chronic pain: Secondary | ICD-10-CM

## 2018-03-17 NOTE — Telephone Encounter (Signed)
Can we call [facility] and find out if they will allow PCP to manage some of their lab orders to decrease specialist appts as she only has 6 in one year?

## 2018-03-18 NOTE — Telephone Encounter (Signed)
Left message with Dr Eliane Decree nurse to see if this is possible. Awaiting callback.

## 2018-03-18 NOTE — Telephone Encounter (Signed)
If you could communicate that to patient. She is just going to have to start with them and see how it goes. She can communicate that she has 6 visits and if they could try to work around that.

## 2018-03-18 NOTE — Telephone Encounter (Signed)
Called Pt - left VM with update

## 2018-03-18 NOTE — Telephone Encounter (Signed)
Received callback from [facility] - they had an established care appointment that was cancelled and not rescheduled. If they need labs they would be done at the same time as the office visit, so it wouldn't count for more than one visit.   They do not order labs to be done unless she is seeing the Provider, so no way for PCP to cut down on that.   She would need to schedule with them to start care.

## 2018-03-21 ENCOUNTER — Other Ambulatory Visit: Payer: Self-pay | Admitting: Physician Assistant

## 2018-03-21 DIAGNOSIS — R600 Localized edema: Secondary | ICD-10-CM

## 2018-03-22 MED ORDER — HYDROCODONE-ACETAMINOPHEN 5-325 MG PO TABS: ORAL_TABLET | ORAL | 0 refills | Status: DC

## 2018-03-23 ENCOUNTER — Telehealth: Payer: Self-pay | Admitting: Physician Assistant

## 2018-03-23 MED ORDER — METHYLPHENIDATE HCL ER 20 MG PO TBCR: 20.0000 mg | EXTENDED_RELEASE_TABLET | Freq: Every day | ORAL | 0 refills | Status: DC

## 2018-03-23 NOTE — Telephone Encounter (Signed)
Patient is aware 

## 2018-03-23 NOTE — Telephone Encounter (Signed)
adderall denied by insurance. Sent concerta to try.

## 2018-04-05 ENCOUNTER — Other Ambulatory Visit: Payer: Self-pay | Admitting: Physician Assistant

## 2018-04-05 DIAGNOSIS — G894 Chronic pain syndrome: Secondary | ICD-10-CM

## 2018-04-06 ENCOUNTER — Other Ambulatory Visit: Payer: Self-pay | Admitting: Physician Assistant

## 2018-04-06 ENCOUNTER — Encounter: Payer: Self-pay | Admitting: Physician Assistant

## 2018-04-06 DIAGNOSIS — G43011 Migraine without aura, intractable, with status migrainosus: Secondary | ICD-10-CM

## 2018-04-06 NOTE — Telephone Encounter (Signed)
Received a fax that generic Concerta has been denied, and it did not meet medical necessity.  Pharmacy has been made aware. There is not an option for an appeal.

## 2018-04-06 NOTE — Addendum Note (Signed)
Addended by: Arvilla Market on: 04/06/2018 03:02 PM   Modules accepted: Orders

## 2018-04-07 NOTE — Addendum Note (Signed)
Addended by: Arvilla Market on: 04/07/2018 01:39 PM   Modules accepted: Orders

## 2018-04-14 ENCOUNTER — Encounter: Payer: Self-pay | Admitting: Physician Assistant

## 2018-04-15 ENCOUNTER — Other Ambulatory Visit: Payer: Self-pay | Admitting: Physician Assistant

## 2018-04-15 MED ORDER — BUTALBITAL-APAP-CAFF-COD 50-325-40-30 MG PO CAPS: 1.0000 | ORAL_CAPSULE | ORAL | 0 refills | Status: DC | PRN

## 2018-04-15 NOTE — Telephone Encounter (Signed)
Can you help her?

## 2018-04-18 NOTE — Telephone Encounter (Signed)
I have sent everything I can send and it does not give me an option to appeal. According to the form that is scanned into her chart it does not cover Adderall. Please advise.

## 2018-04-19 ENCOUNTER — Other Ambulatory Visit: Payer: Self-pay | Admitting: Physician Assistant

## 2018-04-22 ENCOUNTER — Ambulatory Visit: Payer: BLUE CROSS/BLUE SHIELD

## 2018-04-22 DIAGNOSIS — Z13220 Encounter for screening for lipoid disorders: Secondary | ICD-10-CM

## 2018-04-22 DIAGNOSIS — Z1231 Encounter for screening mammogram for malignant neoplasm of breast: Secondary | ICD-10-CM

## 2018-04-22 DIAGNOSIS — Z1329 Encounter for screening for other suspected endocrine disorder: Secondary | ICD-10-CM

## 2018-04-22 DIAGNOSIS — Z131 Encounter for screening for diabetes mellitus: Secondary | ICD-10-CM

## 2018-04-22 NOTE — Progress Notes
Patient: Vanessa Terry  DOB: [birth year]  MRN: [id number]  Date: 04/22/2018       Chief Complaint:   Chief Complaint   Patient presents with   ??? Establish Care       Subjective   Pt is a 48 yo female that is here for establish care. Pt wants to make sure all is ok since her mom and dad have multiple medical issues. Pt has had colonoscopy already due to family history of colon cancer and per pt was negative. Pt states it was done in 2017.     Pt told will be due for pap smear this year. Pt aware that she is due next month.  Pt will schedule for this.     Pt had been trying to get pregnant and was doing hormones but not currently.  Pt states if she gets pregnant she get pregnant.  Pt has a set of 47yo twins- one boy and one girl.    Pt declines immunization flu and tdap. Pt will bring records.  Pt feels she has had Tdap and pt declines the flu vaccine.     Mammogram due and ordered. No surgeries or biopsies done on her breasts per pt.  Pt and I discussed 3D mammograms and pt is interested in this  .   Pt had a cold and now feels better.  She said her symptoms are improved overall.       PMHX, PSHX, Meds, Allergies, Soc Hx, and etc were all reviewed today.     No additional contributory past medical, surgical, family or social history except as documented in care connect.   Complete 14 pointReview of Systems - ROS negative except as documented above     Past Medical History:   Diagnosis Date   ??? Coccyx pain    ??? Constipation    ??? Tinnitus of left ear    ??? Vertigo      Patient Active Problem List   Diagnosis   ??? Tinnitus of left ear   ??? Vertigo   ??? Coccyx pain   ??? Toe pain, left   ??? History of in vitro fertilization   ??? Abscess   ??? Hyperlipidemia, unspecified   ??? Mood changes   ??? Family history of colorectal cancer   ??? Family history of colon cancer requiring screening colonoscopy       Meds:  Outpatient Medications Prior to Visit   Medication Sig   ??? PARoxetine 10 mg tablet Take 1 tablet (10 mg total) by mouth daily. ??? alprazolam 0.25 mg tablet Take 1 tablet 30 mins prior to procedure, repeat as needed. Maximum: 2mg s in 24 hrs.. (Patient not taking: Reported on 08/29/2017.)   ??? cefuroxime 250 mg tablet Take 250 mg by mouth two (2) times daily.   ??? ciprofloxacin 500 mg tablet Take 1 tablet (500 mg total) by mouth two (2) times daily.   ??? estrogens, conjugated, 0.3 mg tablet Take 0.3 mg by mouth daily Patient unsure of dose. [name]   ??? follitropin beta (FOLLISTIM) 900 unit/1.08 mL injection Inject under the skin daily.   ??? leuprolide 7.5 mg injection Inject 7.5 mg into the muscle every twenty eight (28) days.   ??? menotropins 75 units injection Inject into the muscle once.   ??? naproxen 500 mg tablet Take one pill twice daily for the next week and then every 12 hours as needed. (Patient not taking: Reported on 08/29/2017.)   ??? nitrofurantoin, macrocrystal monohydrate, (MACROBID) 100 mg  capsule    ??? phenazopyridine 200 mg tablet      No facility-administered medications prior to visit.        Allergies   Allergen Reactions   ??? Sulfamethoxazole-Trimethoprim      Eye swelling            Objective   Review of Systems   ROS Normal unless checked below: see above    Check if Abnormal  Note Positive Findings    [x]  Constit      []  Eyes      [x]  ENT      []  Thyroid     []  Resp      []  Imm/All      [x]  CV      []  GI      []  GU      []  Endo      []  MS      []  Skin      []  Heme/Lymph      []  Neuro      []  Psych     []  Other       Vitals: BP 108/71  ~ Pulse 67  ~ Temp 36.6 ???C (97.9 ???F) (Oral)  ~ Resp 16  ~ Ht 5' 4'' (1.626 m)  ~ Wt 140 lb (63.5 kg)  ~ LMP 06/20/2017 (Exact Date)  ~ BMI 24.03 kg/m???   Head: Atraumatic and Normocephalic  EENT: PERRLA, EOMI is normal. TM's visible and no erythema or bulging.  External ears are normal.  Buccal mucosa, gums and teeth all are normal and no lesions.  Pharynx is normal without erythema. Nose no discharge. No pain when frontal and maxillary sinuses palp. No LN.   Lungs: CTA B/L No R/R/W Heart: S1S2 RRR, no murmurs  Abdomen: soft, NTND, no masses, BS present, No R/R/G  Ext: no edema, no lesions  Pulses: equal and symmetrical  Skin: Clear of significant lesions  Neuro: Alert and Oriented X 3. CN II-XII intact, KJ +2 Bilaterally      No exam data present  No flowsheet data found.  PH-9 TOTAL SCORE Miller 12/04/2014 04/22/2018   Total Score 12 4   Some encounter information is confidential and restricted. Go to Review Flowsheets activity to see all data.       Labs/Studies:  Lab Results   Component Value Date    NA 138 10/22/2016    K 4.4 10/22/2016    CL 99 10/22/2016    CO2 25 10/22/2016    BUN 13 10/22/2016    CALCIUM 9.7 10/22/2016    ALKPHOS 37 10/22/2016    AST 15 10/22/2016    ALT 14 10/22/2016    BILITOT 0.6 10/22/2016    GLUCOSE 89 10/22/2016     Lab Results   Component Value Date    WBC 7.20 08/29/2017    HGB 12.4 08/29/2017    HCT 36.0 08/29/2017    PLT 263 08/29/2017    RBC 3.81 (L) 08/29/2017    MCV 94.5 08/29/2017         There is no immunization history on file for this patient.    Assessment /Plan           1. Encounter for screening mammogram for breast cancer  Ordered - pt interested in 3D mammogram. Pt is aware of additional charge of $45.00  - POCT urine pregnancy; Future  - POCT urine pregnancy  - Mammogram, screening, bilat breast; Future    2. Screening for endocrine  disorder  Labs ordered  - Comprehensive Metabolic Panel; Future  - CBC & Auto Differential; Future  - TSH with reflex FT4, FT3; Future    3. Diabetes mellitus screening  Labs ordered  - Hgb A1c; Future    4. Screening for hyperlipidemia  Labs ordered.   - Lipid Panel; Future  - Comprehensive Metabolic Panel; Future        Return in about 1 month (around 05/21/2018) for  pap and physical.    Brandy Hale, DO            Time: 60  min was spent with this patient including over 50% Face to face time  discussing medical conditions as above, counseling on nature of conditions and care The above plan of care, diagnosis, orders, and follow-up were discussed with the patient.  Questions related to this recommended plan of care were answered.

## 2018-04-23 ENCOUNTER — Other Ambulatory Visit: Payer: Self-pay | Admitting: Physician Assistant

## 2018-04-23 DIAGNOSIS — M7918 Myalgia, other site: Secondary | ICD-10-CM

## 2018-04-23 DIAGNOSIS — G894 Chronic pain syndrome: Secondary | ICD-10-CM

## 2018-04-29 ENCOUNTER — Ambulatory Visit: Payer: BLUE CROSS/BLUE SHIELD

## 2018-04-29 ENCOUNTER — Ambulatory Visit: Payer: PRIVATE HEALTH INSURANCE | Admitting: Physician Assistant

## 2018-04-29 DIAGNOSIS — Z131 Encounter for screening for diabetes mellitus: Secondary | ICD-10-CM

## 2018-04-29 DIAGNOSIS — Z1329 Encounter for screening for other suspected endocrine disorder: Secondary | ICD-10-CM

## 2018-04-29 DIAGNOSIS — Z13220 Encounter for screening for lipoid disorders: Secondary | ICD-10-CM

## 2018-04-30 LAB — Differential Automated: ABSOLUTE LYMPHOCYTE COUNT: 2.3 10*3/uL (ref 1.30–3.40)

## 2018-04-30 LAB — Lipid Panel: CHOLESTEROL, HDL: 53 mg/dL (ref >50)

## 2018-04-30 LAB — Comprehensive Metabolic Panel
POTASSIUM: 5.1 mmol/L (ref 3.6–5.3)
SODIUM: 140 mmol/L (ref 135–146)

## 2018-04-30 LAB — TSH with reflex FT4, FT3: TSH: 0.83 u[IU]/mL (ref 0.3–4.7)

## 2018-04-30 LAB — CBC: MEAN CORPUSCULAR HEMOGLOBIN: 30.5 pg (ref 26.4–33.4)

## 2018-04-30 LAB — Hgb A1c: HGB A1C - HPLC: 5.5 (ref <5.7)

## 2018-05-04 ENCOUNTER — Ambulatory Visit (INDEPENDENT_AMBULATORY_CARE_PROVIDER_SITE_OTHER): Payer: PRIVATE HEALTH INSURANCE | Admitting: Physician Assistant

## 2018-05-04 ENCOUNTER — Encounter: Payer: Self-pay | Admitting: Physician Assistant

## 2018-05-04 ENCOUNTER — Other Ambulatory Visit: Payer: Self-pay

## 2018-05-04 VITALS — BP 139/87 | HR 85 | Temp 98.1°F

## 2018-05-04 DIAGNOSIS — R5382 Chronic fatigue, unspecified: Secondary | ICD-10-CM | POA: Diagnosis not present

## 2018-05-04 DIAGNOSIS — M79645 Pain in left finger(s): Secondary | ICD-10-CM | POA: Diagnosis not present

## 2018-05-04 DIAGNOSIS — G894 Chronic pain syndrome: Secondary | ICD-10-CM

## 2018-05-04 DIAGNOSIS — R05 Cough: Secondary | ICD-10-CM

## 2018-05-04 DIAGNOSIS — R531 Weakness: Secondary | ICD-10-CM

## 2018-05-04 DIAGNOSIS — G8929 Other chronic pain: Secondary | ICD-10-CM

## 2018-05-04 DIAGNOSIS — R053 Chronic cough: Secondary | ICD-10-CM

## 2018-05-04 DIAGNOSIS — G9332 Myalgic encephalomyelitis/chronic fatigue syndrome: Secondary | ICD-10-CM

## 2018-05-04 DIAGNOSIS — L4050 Arthropathic psoriasis, unspecified: Secondary | ICD-10-CM

## 2018-05-04 MED ORDER — AMPHETAMINE-DEXTROAMPHET ER 30 MG PO CP24: 30.0000 mg | ORAL_CAPSULE | ORAL | 0 refills | Status: DC

## 2018-05-04 MED ORDER — HYDROCODONE-ACETAMINOPHEN 5-325 MG PO TABS: ORAL_TABLET | ORAL | 0 refills | Status: DC

## 2018-05-04 MED ORDER — FAMOTIDINE 40 MG PO TABS: 40.0000 mg | ORAL_TABLET | Freq: Two times a day (BID) | ORAL | 2 refills | Status: DC

## 2018-05-04 MED ORDER — CHLORPHENIRAMINE MALEATE 4 MG PO TABS: 4.0000 mg | ORAL_TABLET | Freq: Two times a day (BID) | ORAL | 0 refills | Status: DC | PRN

## 2018-05-04 NOTE — Progress Notes (Signed)
Subjective:    Patient ID: Sarah Mayer, female    DOB: January 14, 1971, 48 y.o.   MRN: [id number]  HPI  Pt is a 48 yo obese female with psoriatic arthritis, migraines, OSA, CFS, MDD who presents to the clinic for follow up and cough.   Pt just started Taltx with rheumatology. She feels like it could be helping. She has been having large flares of pain. She had to use norco more last week before starting Taltx. She has 3 left from 1/28 rx.   Pt has ongoing cough which is dry. Seems to be worse at night. She is using flonase and xyzal. No fever, chills, wheezing.   She has been more and more dependent on wheel chair. She feels like her legs give out faster and faster. She wonders what the next step is.   .. Active Ambulatory Problems    Diagnosis Date Noted  . Psoriatic arthritis (HCC) 03/24/2016  . Gastroesophageal reflux disease 03/24/2016  . GAD (generalized anxiety disorder) 03/24/2016  . Iron deficiency anemia 03/24/2016  . History of pulmonary embolism 03/27/2016  . History of DVT (deep vein thrombosis) 03/27/2016  . Chronic tension-type headache, not intractable 03/27/2016  . Primary insomnia 03/27/2016  . Bilateral lower extremity edema 03/27/2016  . Chronic pain syndrome 05/25/2016  . Morbid obesity (HCC) 05/27/2016  . Serum calcium elevated 08/30/2016  . Diarrhea 08/30/2016  . Collagenous colitis 10/18/2016  . Barrett's esophagus without dysplasia 10/18/2016  . Arthralgia 10/18/2016  . Rash and nonspecific skin eruption 10/18/2016  . Memory changes 11/23/2016  . Major depression, recurrent, chronic (HCC) 11/23/2016  . OSA (obstructive sleep apnea) 11/23/2016  . Twitching 12/22/2016  . Cough, persistent 12/22/2016  . Bilateral hip pain 12/22/2016  . Tripping over things 01/12/2017  . Left arm pain 01/12/2017  . Left elbow pain 01/12/2017  . Numbness and tingling in left arm 01/17/2017  . Word finding difficulty 03/15/2017  . Intractable migraine without aura and  with status migrainosus 03/15/2017  . Edema of both feet 07/04/2017  . B12 deficiency 07/04/2017  . Acquired hypothyroidism 07/04/2017  . Vitamin D deficiency 07/04/2017  . Secondary hyperparathyroidism, non-renal (HCC) 07/04/2017  . Psoriasis 07/06/2017  . CFS (chronic fatigue syndrome) 07/06/2017  . Easy bruising 07/26/2017  . Myofascial pain 08/19/2017  . Itching 08/19/2017  . Yeast infection 08/19/2017  . IBS (irritable bowel syndrome) 12/03/2017  . Recurrent falls 12/29/2017  . Acute bronchitis 02/07/2018  . Weakness 02/11/2018  . 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 Urinary frequency 02/14/2018  . Chronic left shoulder pain 03/15/2018  . Chronic pain of left thumb 03/15/2018   Resolved Ambulatory Problems    Diagnosis Date Noted  . Hyperparathyroidism, primary (HCC) 09/18/2016   Past Medical History:  Diagnosis Date  . Microscopic colitis   . Migraine       Review of Systems    see HPI.  Objective:   Physical Exam Vitals signs reviewed.  Constitutional:      Appearance: Normal appearance. She is obese.  HENT:     Head: Normocephalic and atraumatic.     Nose: Nose normal.     Mouth/Throat:     Pharynx: Oropharynx is clear.  Cardiovascular:     Rate and Rhythm: Normal rate and regular rhythm.  Pulmonary:     Effort: Pulmonary effort is normal.     Breath sounds: Normal breath sounds. No wheezing or rhonchi.  Neurological:     General: No focal deficit present.  Mental Status: She is alert and oriented to person, place, and time.  Psychiatric:        Mood and Affect: Mood normal.        Behavior: Behavior normal.           Assessment & Plan:  Marland KitchenMarland KitchenDiagnoses and all orders for this visit:  Cough, persistent -     famotidine (PEPCID) 40 MG tablet; Take 1 tablet (40 mg total) by mouth 2 (two) times daily. -     chlorpheniramine (CHLOR-TRIMETON) 4 MG tablet; Take 1 tablet (4 mg total) by mouth 2 (two) times daily as needed for allergies.  Chronic pain of  left thumb -     HYDROcodone-acetaminophen (NORCO/VICODIN) 5-325 MG tablet; Take 1-2 tablets as needed at bedtime for moderate to severe pain.  CFS (chronic fatigue syndrome) -     amphetamine-dextroamphetamine (ADDERALL XR) 30 MG 24 hr capsule; Take 1 capsule (30 mg total) by mouth every morning.  Morbid obesity (HCC) -     Ambulatory referral to Physical Therapy -     Amb Referral to Bariatric Surgery  Weakness -     Ambulatory referral to Physical Therapy  Psoriatic arthritis (HCC) -     Ambulatory referral to Physical Therapy  Chronic pain syndrome -     diclofenac sodium (VOLTAREN) 1 % GEL; APPLY 4 GRAMS EXTERNALLY TO THE AFFECTED AREA FOUR TIMES DAILY   For cough do not see any signs of infection. Could be acid reflux vs allergies. Added chlortrimeton and pepcid. Follow up as needed.   Pt would benefit from strength therapy. She is becoming more and more reliant on wheelchair which is creating muscle atrophy. Referral made to PT. Hopefully one with a pool to start with.   Pt would benefit from weight loss surgery. Not sure if she would be a candidate. Will make referral. She has tried saxenda, numerous diets, struggles to exercise.   Pt has chronic pain. She could benefit from tens unit. Will make referral.   PRN norco given. Last rx 03/22/18.  Marland KitchenMarland KitchenPDMP reviewed during this encounter.   Marland KitchenSpent 40 minutes with patient and greater than 50 percent of visit spent counseling patient regarding treatment plan.

## 2018-05-06 ENCOUNTER — Other Ambulatory Visit: Payer: Self-pay | Admitting: Physician Assistant

## 2018-05-06 DIAGNOSIS — G894 Chronic pain syndrome: Secondary | ICD-10-CM

## 2018-05-09 ENCOUNTER — Other Ambulatory Visit: Payer: Self-pay | Admitting: Physician Assistant

## 2018-05-09 ENCOUNTER — Encounter: Payer: Self-pay | Admitting: Physician Assistant

## 2018-05-09 DIAGNOSIS — R21 Rash and other nonspecific skin eruption: Secondary | ICD-10-CM

## 2018-05-09 MED ORDER — HYDROXYZINE HCL 50 MG PO TABS: ORAL_TABLET | ORAL | 2 refills | Status: DC

## 2018-05-09 MED ORDER — AMBULATORY NON FORMULARY MEDICATION: 0 refills | Status: AC

## 2018-05-09 MED ORDER — DICLOFENAC SODIUM 1 % TD GEL: TRANSDERMAL | 3 refills | Status: DC

## 2018-05-11 NOTE — Telephone Encounter (Signed)
[facility] called and stated they received the referral for Bariatric Surgery and they do not do this at this clinic. Please refer patient to another clinic.

## 2018-05-12 ENCOUNTER — Encounter: Payer: Self-pay | Admitting: Physician Assistant

## 2018-05-16 ENCOUNTER — Ambulatory Visit: Payer: PRIVATE HEALTH INSURANCE

## 2018-05-17 ENCOUNTER — Encounter: Payer: Self-pay | Admitting: Physician Assistant

## 2018-05-18 ENCOUNTER — Ambulatory Visit: Payer: Commercial Managed Care - PPO

## 2018-05-18 MED ORDER — CLOBETASOL PROPIONATE 0.05 % EX CREA: 1.0000 "application " | TOPICAL_CREAM | Freq: Two times a day (BID) | CUTANEOUS | 2 refills | Status: DC

## 2018-05-30 ENCOUNTER — Encounter: Payer: Self-pay | Admitting: Physician Assistant

## 2018-06-01 MED ORDER — FLUCONAZOLE 150 MG PO TABS: 150.0000 mg | ORAL_TABLET | Freq: Once | ORAL | 0 refills | Status: AC

## 2018-06-01 NOTE — Telephone Encounter (Signed)
Scheduled for a 40 minute office visit.

## 2018-06-01 NOTE — Telephone Encounter (Signed)
Needs OV for next week. 40 minute please.

## 2018-06-03 ENCOUNTER — Other Ambulatory Visit: Payer: Self-pay | Admitting: Physician Assistant

## 2018-06-03 DIAGNOSIS — F5101 Primary insomnia: Secondary | ICD-10-CM

## 2018-06-06 ENCOUNTER — Ambulatory Visit (INDEPENDENT_AMBULATORY_CARE_PROVIDER_SITE_OTHER): Payer: PRIVATE HEALTH INSURANCE | Admitting: Physician Assistant

## 2018-06-06 ENCOUNTER — Other Ambulatory Visit: Payer: Self-pay

## 2018-06-06 ENCOUNTER — Encounter: Payer: Self-pay | Admitting: Physician Assistant

## 2018-06-06 VITALS — BP 118/89 | HR 90 | Temp 98.7°F | Ht 64.0 in

## 2018-06-06 DIAGNOSIS — J40 Bronchitis, not specified as acute or chronic: Secondary | ICD-10-CM

## 2018-06-06 DIAGNOSIS — L299 Pruritus, unspecified: Secondary | ICD-10-CM

## 2018-06-06 DIAGNOSIS — R5382 Chronic fatigue, unspecified: Secondary | ICD-10-CM

## 2018-06-06 DIAGNOSIS — J329 Chronic sinusitis, unspecified: Secondary | ICD-10-CM

## 2018-06-06 DIAGNOSIS — G9332 Myalgic encephalomyelitis/chronic fatigue syndrome: Secondary | ICD-10-CM

## 2018-06-06 DIAGNOSIS — M7989 Other specified soft tissue disorders: Secondary | ICD-10-CM | POA: Diagnosis not present

## 2018-06-06 DIAGNOSIS — M79605 Pain in left leg: Secondary | ICD-10-CM

## 2018-06-06 MED ORDER — AMPHETAMINE-DEXTROAMPHET ER 30 MG PO CP24: 30.0000 mg | ORAL_CAPSULE | ORAL | 0 refills | Status: DC

## 2018-06-06 MED ORDER — LEVOFLOXACIN 500 MG PO TABS: 500.0000 mg | ORAL_TABLET | Freq: Every day | ORAL | 0 refills | Status: DC

## 2018-06-06 NOTE — Patient Instructions (Signed)
Increase vistaril to 100mg .  Start levaquin.  Continue adderall.

## 2018-06-06 NOTE — Progress Notes (Signed)
Subjective:    Patient ID: Sarah Mayer, female    DOB: 27-Jun-1970, 48 y.o.   MRN: [id number]  HPI  Pt is a 48 yo female with extensive PMH who presents to the clinic with persistent cough that continues and now worsening again. Her cough has been manageable until her sinus pressure started back up a week ago. Her ear pop and she is very congested. She has a lot of sinus drainage and feels like it is running down her throat. She gets very "choked up in the mornings". No fever, chills. Some chest tightness but not actually SOB. She is using her albuterol but does not seem to give her much relief. She feels like she is "more full of mucus" than "constriction". For ongoing cough is on pepcid. Currently on prednisone for RA.   She started adderall for her fatigue. It does help some to give her energy. She would like to continue. No palpitations or problems sleeping.   Pt does have itching that occurs at night. Vistaril does help but still has nights where the itching is really bad. She gets these sores over her arms and legs from scratching. Triamcinolone cream does help.   She reports tens unit does seem to be helping with her chronic pain. She continues to have hand joint pain and wonders if she could get the tens unit glove to help more.   She is having some discomfort behind left knee. She is also having some left ankle swelling. No fall or known injury. DVT in 2014 that keeps her worried. Not on any anti-coagulant.   Pt would like to go back on saxenda if insurance will pay for it. She was on it and seemed to be working some. She had to go off due to insurance switch.   .. Active Ambulatory Problems    Diagnosis Date Noted  . Psoriatic arthritis (HCC) 03/24/2016  . Gastroesophageal reflux disease 03/24/2016  . GAD (generalized anxiety disorder) 03/24/2016  . Iron deficiency anemia 03/24/2016  . History of pulmonary embolism 03/27/2016  . History of DVT (deep vein thrombosis)  03/27/2016  . Chronic tension-type headache, not intractable 03/27/2016  . Primary insomnia 03/27/2016  . Bilateral lower extremity edema 03/27/2016  . Chronic pain syndrome 05/25/2016  . Morbid obesity (HCC) 05/27/2016  . Serum calcium elevated 08/30/2016  . Diarrhea 08/30/2016  . Collagenous colitis 10/18/2016  . Barrett's esophagus without dysplasia 10/18/2016  . Arthralgia 10/18/2016  . Rash and nonspecific skin eruption 10/18/2016  . Memory changes 11/23/2016  . Major depression, recurrent, chronic (HCC) 11/23/2016  . OSA (obstructive sleep apnea) 11/23/2016  . Twitching 12/22/2016  . Cough, persistent 12/22/2016  . Bilateral hip pain 12/22/2016  . Tripping over things 01/12/2017  . Left arm pain 01/12/2017  . Left elbow pain 01/12/2017  . Numbness and tingling in left arm 01/17/2017  . Word finding difficulty 03/15/2017  . Intractable migraine without aura and with status migrainosus 03/15/2017  . Edema of both feet 07/04/2017  . B12 deficiency 07/04/2017  . Acquired hypothyroidism 07/04/2017  . Vitamin D deficiency 07/04/2017  . Secondary hyperparathyroidism, non-renal (HCC) 07/04/2017  . Psoriasis 07/06/2017  . CFS (chronic fatigue syndrome) 07/06/2017  . Easy bruising 07/26/2017  . Myofascial pain 08/19/2017  . Itching 08/19/2017  . Yeast infection 08/19/2017  . IBS (irritable bowel syndrome) 12/03/2017  . Recurrent falls 12/29/2017  . Acute bronchitis 02/07/2018  . Weakness 02/11/2018  . 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 Urinary frequency 02/14/2018  .  Chronic left shoulder pain 03/15/2018  . Chronic pain of left thumb 03/15/2018   Resolved Ambulatory Problems    Diagnosis Date Noted  . Hyperparathyroidism, primary (HCC) 09/18/2016   Past Medical History:  Diagnosis Date  . Microscopic colitis   . Migraine       Review of Systems    see HPI.  Objective:   Physical Exam Vitals signs reviewed.  Constitutional:      Appearance: Normal appearance. She  is obese.  HENT:     Head: Normocephalic.     Right Ear: Tympanic membrane normal.     Left Ear: Tympanic membrane normal.     Nose: Congestion present.     Mouth/Throat:     Mouth: Mucous membranes are moist.  Eyes:     Conjunctiva/sclera: Conjunctivae normal.  Cardiovascular:     Rate and Rhythm: Normal rate and regular rhythm.  Pulmonary:     Effort: Pulmonary effort is normal.     Breath sounds: No wheezing or rhonchi.  Skin:    Comments: L calf size 56 R calf size 60  Very scant non pitting edema in left foot.   No tenderness to palpation behind the left knee. No bruising, redness, warmth or swelling.   Sores over arms and legs in different stages of healing. Some scabbed over and other freshly open.   Neurological:     General: No focal deficit present.     Mental Status: She is alert.  Psychiatric:        Mood and Affect: Mood normal.           Assessment & Plan:  [name].[name].Sarah CruiseKristin was seen today for sinus problem.  Diagnoses and all orders for this visit:  Sinobronchitis -     levofloxacin (LEVAQUIN) 500 MG tablet; Take 1 tablet (500 mg total) by mouth daily.  CFS (chronic fatigue syndrome) -     amphetamine-dextroamphetamine (ADDERALL XR) 30 MG 24 hr capsule; Take 1 capsule (30 mg total) by mouth every morning. -     amphetamine-dextroamphetamine (ADDERALL XR) 30 MG 24 hr capsule; Take 1 capsule (30 mg total) by mouth every morning. -     amphetamine-dextroamphetamine (ADDERALL XR) 30 MG 24 hr capsule; Take 1 capsule (30 mg total) by mouth every morning.  Itching  Left leg swelling  Left leg pain   Will call to get glove for tens unit via [employer].   Treated for sinobronchitis with levaquin due to finishing doxycycline 2 months ago. Continue with albuterol as needed. Finish prednisone. Continue pepcid and anti-histamines.   Refilled adderall for CFS.   Increase vistaril for itching at night. I think she is reopening sores on body at night and  healing delayed due to RA medication.   Wells Criteria 1, low risk for hx of DVT. She is not very ambulatory. right calf size is actually bigger than left(symptomatic side). Discussed other signs of DVT. If symptoms continue with get a doppler of left leg.  Restart saxenda for weight loss.   [name]..Spent 40 minutes with patient and greater than 50 percent of visit spent counseling patient regarding treatment plan.

## 2018-06-08 ENCOUNTER — Other Ambulatory Visit: Payer: Self-pay | Admitting: Physician Assistant

## 2018-06-09 NOTE — Telephone Encounter (Signed)
Last RF sent 04/15/18 for #30 no RF  Last OV 06/06/18  RX pended, please send if appropriate

## 2018-06-10 MED ORDER — LIRAGLUTIDE -WEIGHT MANAGEMENT 18 MG/3ML ~~LOC~~ SOPN: 0.6000 mg | PEN_INJECTOR | Freq: Every day | SUBCUTANEOUS | 1 refills | Status: DC

## 2018-06-12 ENCOUNTER — Other Ambulatory Visit: Payer: Self-pay | Admitting: Physician Assistant

## 2018-06-12 DIAGNOSIS — R600 Localized edema: Secondary | ICD-10-CM

## 2018-06-16 ENCOUNTER — Ambulatory Visit: Payer: BLUE CROSS/BLUE SHIELD

## 2018-06-23 ENCOUNTER — Ambulatory Visit: Payer: PRIVATE HEALTH INSURANCE | Admitting: Family Medicine

## 2018-06-23 ENCOUNTER — Encounter: Payer: Self-pay | Admitting: Physician Assistant

## 2018-06-23 NOTE — Telephone Encounter (Signed)
Patient advised to go to the ED. She may need an X-ray per Dr Denyse Amass.

## 2018-06-23 NOTE — Telephone Encounter (Signed)
Patient scheduled.

## 2018-06-23 NOTE — Telephone Encounter (Signed)
Left message for patient to call back  

## 2018-06-23 NOTE — Telephone Encounter (Signed)
If she is having trouble breathing then she either needs to be seen in office or go to ER. She will need CXR.

## 2018-06-24 ENCOUNTER — Encounter: Payer: Self-pay | Admitting: Physician Assistant

## 2018-06-27 NOTE — Telephone Encounter (Signed)
Ok to schedule on Wednesday. Please make 40 minutes.

## 2018-06-28 NOTE — Telephone Encounter (Signed)
Scheduled

## 2018-06-29 ENCOUNTER — Encounter: Payer: Self-pay | Admitting: Physician Assistant

## 2018-06-29 ENCOUNTER — Ambulatory Visit (INDEPENDENT_AMBULATORY_CARE_PROVIDER_SITE_OTHER): Payer: PRIVATE HEALTH INSURANCE | Admitting: Physician Assistant

## 2018-06-29 VITALS — BP 132/88 | HR 94 | Temp 97.7°F

## 2018-06-29 DIAGNOSIS — J181 Lobar pneumonia, unspecified organism: Secondary | ICD-10-CM

## 2018-06-29 DIAGNOSIS — J189 Pneumonia, unspecified organism: Secondary | ICD-10-CM

## 2018-06-29 DIAGNOSIS — R05 Cough: Secondary | ICD-10-CM | POA: Diagnosis not present

## 2018-06-29 DIAGNOSIS — R509 Fever, unspecified: Secondary | ICD-10-CM

## 2018-06-29 DIAGNOSIS — R059 Cough, unspecified: Secondary | ICD-10-CM

## 2018-06-29 LAB — POCT INFLUENZA A/B
Influenza A, POC: NEGATIVE
Influenza B, POC: NEGATIVE

## 2018-06-29 MED ORDER — HYDROCOD POLST-CPM POLST ER 10-8 MG/5ML PO SUER: 5.0000 mL | Freq: Two times a day (BID) | ORAL | 0 refills | Status: DC | PRN

## 2018-06-29 MED ORDER — AMBULATORY NON FORMULARY MEDICATION: 0 refills | Status: DC

## 2018-06-29 MED ORDER — BENZONATATE 200 MG PO CAPS: 200.0000 mg | ORAL_CAPSULE | Freq: Two times a day (BID) | ORAL | 0 refills | Status: DC | PRN

## 2018-06-29 NOTE — Progress Notes (Signed)
Call pt: negative for flu.

## 2018-06-29 NOTE — Addendum Note (Signed)
Addended by: Jomarie Longs on: 06/29/2018 02:51 PM   Modules accepted: Orders

## 2018-06-29 NOTE — Patient Instructions (Signed)
Will call with results.  Tessalon pearls and Tussirex sent.  Start carafate 4 times a day.

## 2018-06-29 NOTE — Progress Notes (Signed)
Subjective:    Patient ID: Sarah Mayer, female    DOB: 11-30-1970, 48 y.o.   MRN: [id number]  HPI Pt is a 48 yo female who is immunocompromised with pmh of fibromyalgia, DVT, seasonal allergies, GERD psoriatic arthritis who has been coughing since December. She has been treated with 2 rounds of abx. She went to the ED on 06/23/18  who was recent diagnosed with pneumonia confirmed by xray. Started on zpak. They did not test her for flu or covid. She is very concerned. Her husband is a Programmer, applicationsessential worker other than that she has had very little contact with people. She has finished zpak and does not feel better.   She is here today to be swabbed for flu and covid via drive by.   .. Active Ambulatory Problems    Diagnosis Date Noted  . Psoriatic arthritis (HCC) 03/24/2016  . Gastroesophageal reflux disease 03/24/2016  . GAD (generalized anxiety disorder) 03/24/2016  . Iron deficiency anemia 03/24/2016  . History of pulmonary embolism 03/27/2016  . History of DVT (deep vein thrombosis) 03/27/2016  . Chronic tension-type headache, not intractable 03/27/2016  . Primary insomnia 03/27/2016  . Bilateral lower extremity edema 03/27/2016  . Chronic pain syndrome 05/25/2016  . Morbid obesity (HCC) 05/27/2016  . Serum calcium elevated 08/30/2016  . Diarrhea 08/30/2016  . Collagenous colitis 10/18/2016  . Barrett's esophagus without dysplasia 10/18/2016  . Arthralgia 10/18/2016  . Rash and nonspecific skin eruption 10/18/2016  . Memory changes 11/23/2016  . Major depression, recurrent, chronic (HCC) 11/23/2016  . OSA (obstructive sleep apnea) 11/23/2016  . Twitching 12/22/2016  . Cough, persistent 12/22/2016  . Bilateral hip pain 12/22/2016  . Tripping over things 01/12/2017  . Left arm pain 01/12/2017  . Left elbow pain 01/12/2017  . Numbness and tingling in left arm 01/17/2017  . Word finding difficulty 03/15/2017  . Intractable migraine without aura and with status migrainosus  03/15/2017  . Edema of both feet 07/04/2017  . B12 deficiency 07/04/2017  . Acquired hypothyroidism 07/04/2017  . Vitamin D deficiency 07/04/2017  . Secondary hyperparathyroidism, non-renal (HCC) 07/04/2017  . Psoriasis 07/06/2017  . CFS (chronic fatigue syndrome) 07/06/2017  . Easy bruising 07/26/2017  . Myofascial pain 08/19/2017  . Itching 08/19/2017  . Yeast infection 08/19/2017  . IBS (irritable bowel syndrome) 12/03/2017  . Recurrent falls 12/29/2017  . Acute bronchitis 02/07/2018  . Weakness 02/11/2018  . 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 Urinary frequency 02/14/2018  . Chronic left shoulder pain 03/15/2018  . Chronic pain of left thumb 03/15/2018   Resolved Ambulatory Problems    Diagnosis Date Noted  . Hyperparathyroidism, primary (HCC) 09/18/2016   Past Medical History:  Diagnosis Date  . Microscopic colitis   . Migraine        Review of Systems See HPI.     Objective:   Physical Exam Not done. Drive by swab.        Assessment & Plan:  [name].[name].Sarah Mayer was seen today for cough.  Diagnoses and all orders for this visit:  Cough -     SARS-COV-2 RNA,(COVID-19) QUAL NAAT -     POCT Influenza A/B -     chlorpheniramine-HYDROcodone (TUSSIONEX) 10-8 MG/5ML SUER; Take 5 mLs by mouth every 12 (twelve) hours as needed. -     benzonatate (TESSALON) 200 MG capsule; Take 1 capsule (200 mg total) by mouth 2 (two) times daily as needed for cough.  Fever, unspecified fever cause -     SARS-COV-2 RNA,(COVID-19) QUAL  NAAT -     POCT Influenza A/B  Pneumonia of right lower lobe due to infectious organism (HCC)   .[name] Results for orders placed or performed in visit on 06/29/18  POCT Influenza A/B  Result Value Ref Range   Influenza A, POC Negative Negative   Influenza B, POC Negative Negative   Will call with result.   [name].Suspected COVID-19 self-isolation guidelines.   Please self-isolate for at least 3 days (72 hours) have passed since recovery defined as resolution  of fever without the use of fever-reducing medications and improvement in respiratory symptoms (e.g., cough, shortness of breath), and at least 7 days have passed since symptoms first appeared.   You do not need a negative test to document recovery.  Close contacts of a person with known or suspected COVID-19 should self-monitor their temperature and symptoms of COVID-19, limit outside interaction as much as possible for 14 days, and self-isolate if they develop symptoms.   Wrote her husband out of work until test results come back.   Cough syrup and tessalon given for cough.  Start carafate to see if any reflux component to cough. Continue aciphex.

## 2018-06-30 ENCOUNTER — Other Ambulatory Visit: Payer: Self-pay | Admitting: Physician Assistant

## 2018-07-01 NOTE — Telephone Encounter (Signed)
Please advise on refill.

## 2018-07-03 ENCOUNTER — Encounter: Payer: Self-pay | Admitting: Physician Assistant

## 2018-07-03 LAB — SARS-COV-2 RNA,(COVID-19) QUALITATIVE NAAT: SARS CoV2 RNA: NOT DETECTED

## 2018-07-04 ENCOUNTER — Telehealth: Payer: Self-pay | Admitting: Neurology

## 2018-07-04 NOTE — Progress Notes (Signed)
GREAT news. You do NOT have COVID. Husband can go back to work.   How are you feeling?

## 2018-07-04 NOTE — Telephone Encounter (Signed)
Husband left vm stating we have not called patient with Covid test results.  Left message on machine for patient's husband making him aware that patient viewed results on mychart and responded to message, so we did not call. He is to call back with any questions.

## 2018-07-06 ENCOUNTER — Other Ambulatory Visit: Payer: Self-pay | Admitting: Physician Assistant

## 2018-07-06 DIAGNOSIS — G43011 Migraine without aura, intractable, with status migrainosus: Secondary | ICD-10-CM

## 2018-07-08 ENCOUNTER — Other Ambulatory Visit: Payer: Self-pay | Admitting: Physician Assistant

## 2018-07-08 DIAGNOSIS — J329 Chronic sinusitis, unspecified: Secondary | ICD-10-CM

## 2018-07-08 DIAGNOSIS — J40 Bronchitis, not specified as acute or chronic: Secondary | ICD-10-CM

## 2018-07-08 DIAGNOSIS — K2100 Gastro-esophageal reflux disease with esophagitis, without bleeding: Secondary | ICD-10-CM

## 2018-07-11 ENCOUNTER — Ambulatory Visit: Payer: BLUE CROSS/BLUE SHIELD

## 2018-07-11 ENCOUNTER — Other Ambulatory Visit: Payer: Self-pay | Admitting: Neurology

## 2018-07-11 DIAGNOSIS — R053 Chronic cough: Secondary | ICD-10-CM

## 2018-07-11 DIAGNOSIS — R05 Cough: Secondary | ICD-10-CM

## 2018-07-11 MED ORDER — CHLORPHENIRAMINE MALEATE 4 MG PO TABS: 4.0000 mg | ORAL_TABLET | Freq: Two times a day (BID) | ORAL | 0 refills | Status: DC | PRN

## 2018-07-19 MED ORDER — LEVOFLOXACIN 750 MG PO TABS: 750.0000 mg | ORAL_TABLET | Freq: Every day | ORAL | 0 refills | Status: DC

## 2018-07-28 ENCOUNTER — Encounter: Payer: Self-pay | Admitting: Physician Assistant

## 2018-07-28 DIAGNOSIS — R05 Cough: Secondary | ICD-10-CM

## 2018-07-28 DIAGNOSIS — R053 Chronic cough: Secondary | ICD-10-CM

## 2018-07-28 DIAGNOSIS — R531 Weakness: Secondary | ICD-10-CM

## 2018-07-29 NOTE — Telephone Encounter (Signed)
Please get symbicort sample for patient to leave up front.

## 2018-07-30 ENCOUNTER — Other Ambulatory Visit: Payer: Self-pay | Admitting: Physician Assistant

## 2018-07-30 DIAGNOSIS — K580 Irritable bowel syndrome with diarrhea: Secondary | ICD-10-CM

## 2018-08-01 ENCOUNTER — Ambulatory Visit (INDEPENDENT_AMBULATORY_CARE_PROVIDER_SITE_OTHER): Payer: PRIVATE HEALTH INSURANCE

## 2018-08-01 ENCOUNTER — Other Ambulatory Visit: Payer: Self-pay

## 2018-08-01 DIAGNOSIS — R05 Cough: Secondary | ICD-10-CM

## 2018-08-01 DIAGNOSIS — R531 Weakness: Secondary | ICD-10-CM

## 2018-08-02 ENCOUNTER — Encounter: Payer: Self-pay | Admitting: Physician Assistant

## 2018-08-02 LAB — COMPLETE METABOLIC PANEL WITH GFR
AG Ratio: 1.8 (calc) (ref 1.0–2.5)
ALT: 26 U/L (ref 6–29)
AST: 26 U/L (ref 10–35)
Albumin: 4 g/dL (ref 3.6–5.1)
Alkaline phosphatase (APISO): 110 U/L (ref 31–125)
BUN: 11 mg/dL (ref 7–25)
CO2: 28 mmol/L (ref 20–32)
Calcium: 9.3 mg/dL (ref 8.6–10.2)
Chloride: 103 mmol/L (ref 98–110)
Creat: 0.98 mg/dL (ref 0.50–1.10)
GFR, Est African American: 80 mL/min/{1.73_m2} (ref >=60)
GFR, Est Non African American: 69 mL/min/{1.73_m2} (ref >=60)
Globulin: 2.2 g/dL (calc) (ref 1.9–3.7)
Glucose, Bld: 112 mg/dL — ABNORMAL HIGH (ref 65–99)
Potassium: 4.2 mmol/L (ref 3.5–5.3)
Sodium: 140 mmol/L (ref 135–146)
Total Bilirubin: 0.4 mg/dL (ref 0.2–1.2)
Total Protein: 6.2 g/dL (ref 6.1–8.1)

## 2018-08-02 LAB — CBC WITH DIFFERENTIAL/PLATELET
Absolute Monocytes: 486 cells/uL (ref 200–950)
Basophils Absolute: 51 cells/uL (ref 0–200)
Basophils Relative: 0.8 %
Eosinophils Absolute: 102 cells/uL (ref 15–500)
Eosinophils Relative: 1.6 %
HCT: 38.6 % (ref 35.0–45.0)
Hemoglobin: 12.5 g/dL (ref 11.7–15.5)
Lymphs Abs: 1395 cells/uL (ref 850–3900)
MCH: 26.2 pg — ABNORMAL LOW (ref 27.0–33.0)
MCHC: 32.4 g/dL (ref 32.0–36.0)
MCV: 80.8 fL (ref 80.0–100.0)
MPV: 11.8 fL (ref 7.5–12.5)
Monocytes Relative: 7.6 %
Neutro Abs: 4365 cells/uL (ref 1500–7800)
Neutrophils Relative %: 68.2 %
Platelets: 332 10*3/uL (ref 140–400)
RBC: 4.78 10*6/uL (ref 3.80–5.10)
RDW: 14.3 % (ref 11.0–15.0)
Total Lymphocyte: 21.8 %
WBC: 6.4 10*3/uL (ref 3.8–10.8)

## 2018-08-02 NOTE — Telephone Encounter (Signed)
Call pt: CXR looks great.

## 2018-08-02 NOTE — Telephone Encounter (Signed)
Called and left pt msg of results. Advised her to call or send Mychart msg back concerning pulmonologist/symbicort sample

## 2018-08-02 NOTE — Telephone Encounter (Signed)
Call pt: normal WBC. Normal kidney and liver. Have you heard from pulmonology yet? Did you pick up symbicort sample.

## 2018-08-03 ENCOUNTER — Inpatient Hospital Stay: Payer: PRIVATE HEALTH INSURANCE

## 2018-08-03 DIAGNOSIS — Z1231 Encounter for screening mammogram for malignant neoplasm of breast: Secondary | ICD-10-CM

## 2018-08-04 ENCOUNTER — Telehealth: Payer: BLUE CROSS/BLUE SHIELD

## 2018-08-04 ENCOUNTER — Encounter: Payer: Self-pay | Admitting: Physician Assistant

## 2018-08-04 DIAGNOSIS — Z789 Other specified health status: Secondary | ICD-10-CM

## 2018-08-04 DIAGNOSIS — N6012 Diffuse cystic mastopathy of left breast: Secondary | ICD-10-CM

## 2018-08-04 DIAGNOSIS — N6011 Diffuse cystic mastopathy of right breast: Secondary | ICD-10-CM

## 2018-08-05 ENCOUNTER — Ambulatory Visit: Payer: Commercial Managed Care - PPO

## 2018-08-05 NOTE — Telephone Encounter
Vanessa Terry,    Schedule a NV 1st as pt's blood work would need to be cleared 1st before attending to MRI.

## 2018-08-05 NOTE — Telephone Encounter
Called pt to discuss her mammogram.  It is normal but recommends breast MRI since fibroglandular tissue seen. D/w pt and she would like to do.  It has been ordered. Pt will need to do labs prior to MRI.

## 2018-08-06 ENCOUNTER — Other Ambulatory Visit: Payer: Self-pay | Admitting: Physician Assistant

## 2018-08-06 DIAGNOSIS — R053 Chronic cough: Secondary | ICD-10-CM

## 2018-08-06 DIAGNOSIS — K2100 Gastro-esophageal reflux disease with esophagitis, without bleeding: Secondary | ICD-10-CM

## 2018-08-06 DIAGNOSIS — R059 Cough, unspecified: Secondary | ICD-10-CM

## 2018-08-06 DIAGNOSIS — R05 Cough: Secondary | ICD-10-CM

## 2018-08-08 NOTE — Telephone Encounter
Spoke to the patient, and she will callback to set up a nurse visit.     Upon callback, please set up a nurse visit, orders in [place].    Thank you

## 2018-08-10 MED ORDER — BUMETANIDE 0.5 MG PO TABS: 0.5000 mg | ORAL_TABLET | Freq: Two times a day (BID) | ORAL | 0 refills | Status: DC

## 2018-08-11 ENCOUNTER — Ambulatory Visit: Payer: BLUE CROSS/BLUE SHIELD

## 2018-08-11 ENCOUNTER — Ambulatory Visit: Payer: PRIVATE HEALTH INSURANCE

## 2018-08-11 DIAGNOSIS — N979 Female infertility, unspecified: Secondary | ICD-10-CM

## 2018-08-11 DIAGNOSIS — E782 Mixed hyperlipidemia: Secondary | ICD-10-CM

## 2018-08-11 DIAGNOSIS — Z0110 Encounter for examination of ears and hearing without abnormal findings: Secondary | ICD-10-CM

## 2018-08-11 DIAGNOSIS — L570 Actinic keratosis: Secondary | ICD-10-CM

## 2018-08-11 DIAGNOSIS — F320 Major depressive disorder, single episode, mild: Secondary | ICD-10-CM

## 2018-08-11 DIAGNOSIS — Z1329 Encounter for screening for other suspected endocrine disorder: Secondary | ICD-10-CM

## 2018-08-11 DIAGNOSIS — Z13220 Encounter for screening for lipoid disorders: Secondary | ICD-10-CM

## 2018-08-11 DIAGNOSIS — Z131 Encounter for screening for diabetes mellitus: Secondary | ICD-10-CM

## 2018-08-11 DIAGNOSIS — Z0100 Encounter for examination of eyes and vision without abnormal findings: Secondary | ICD-10-CM

## 2018-08-11 DIAGNOSIS — Z0000 Encounter for general adult medical examination without abnormal findings: Secondary | ICD-10-CM

## 2018-08-11 DIAGNOSIS — Z8742 Personal history of other diseases of the female genital tract: Secondary | ICD-10-CM

## 2018-08-11 DIAGNOSIS — Z124 Encounter for screening for malignant neoplasm of cervix: Secondary | ICD-10-CM

## 2018-08-11 NOTE — Progress Notes
PATIENT: Vanessa Terry  MRN: [id number]  DOB: November 26, 1970  DATE OF SERVICE: 08/11/2018    REFERRING PRACTITIONER: No ref. provider found  PRIMARY CARE PROVIDER: Brandy Hale., DO      CHIEF COMPLAINT:   Chief Complaint   Patient presents with   ??? Annual Exam        Subjective:      Vanessa Terry is a 48 y.o. female for physical.  Due for pap. Mammogram done and MRI for further detail ordered already. Pt needs to bring in immunization records.       Pt has had infertility treatment in the past.  Pt wants to get pregnant again potentially. Pt asking about her hormone levels.    Pt has had ovarian cyst in past and needed surgery for it. Pt has been feeling uncomfortable like when she had cyst. Pt asking for eval for this.     Pt has no UTI symptoms.       Past Medical History:   Diagnosis Date   ??? Anemia    ??? Anxiety    ??? Coccyx pain    ??? Constipation    ??? Depression    ??? IBS (irritable bowel syndrome)    ??? Tinnitus of left ear    ??? Vertigo      Past Surgical History:   Procedure Laterality Date   ??? CESAREAN SECTION  2014   ??? KNEE SURGERY  1994    in [place], arthroscopic   ??? LAPAROSCOPY DIAGNOSTIC / BIOPSY / ASPIRATION / LYSIS  2011   ??? MYOMECTOMY  2012    [facility]   ??? OVARY SURGERY  2011   ??? REMOVAL OF R OVARIAN CYST  2011    [facility]      Family History   Problem Relation Age of Onset   ??? Aortic stenosis Mother         s/p AV valve replacement   ??? Atrial fibrillation Mother         deceased from cardiosynch procedure   ??? Breast cancer Mother 18   ??? Alcohol abuse Mother    ??? Arthritis Mother    ??? Depression Mother    ??? Hyperlipidemia Mother    ??? Hypertension Mother    ??? Stroke Mother    ??? Other (myocardial infarction) Mother    ??? Aortic stenosis Cousin         s/p AV valve replacement   ??? Meniere's disease Other         niece   ??? Colon cancer Maternal Uncle 45   ??? Rectal cancer Sister 43   ??? Depression Sister    ??? Alcohol abuse Sister    ??? Arthritis Sister    ??? Atrial fibrillation Father ??? Heart failure Father    ??? Melanoma Father    ??? Alcohol abuse Father    ??? Arthritis Father    ??? Diabetes type II Father    ??? Hyperlipidemia Father    ??? Hypertension Father    ??? Kidney disease Father    ??? Learning disabilities Father    ??? Stroke Father    ??? Other (myocardial infarction) Father    ??? Hypertension Brother    ??? Learning disabilities Brother    ??? Alcohol abuse Brother    ??? Arthritis Brother    ??? Arthritis Paternal Grandmother    ??? Alcohol abuse Paternal Grandfather    ??? Arthritis Paternal Grandfather    ??? Alcohol abuse Sister    ???  Arthritis Sister    ??? Diabetes type II Brother    ??? Arthritis Brother    ??? Other (congenital cataract) Daughter    ??? Club foot Niece    ??? Other (aortic valve replaced) Maternal Cousin      Social History     Socioeconomic History   ??? Marital status: Married     Spouse name: Not on file   ??? Number of children: Not on file   ??? Years of education: Not on file   ??? Highest education level: Not on file   Occupational History   ??? Not on file   Social Needs   ??? Financial resource strain: Not on file   ??? Food insecurity:     Worry: Not on file     Inability: Not on file   ??? Transportation needs:     Medical: Not on file     Non-medical: Not on file   Tobacco Use   ??? Smoking status: Former Smoker   ??? Smokeless tobacco: Never Used   ??? Tobacco comment: quit 10 yrs ago, smoked socially x15 yrs   Substance and Sexual Activity   ??? Alcohol use: No     Alcohol/week: 0.0 oz   ??? Drug use: No   ??? Sexual activity: Yes     Partners: Male     Birth control/protection: None   Lifestyle   ??? Physical activity:     Days per week: 0 days     Minutes per session: Not on file   ??? Stress: Rather much   Relationships   ??? Social connections:     Talks on phone: Not on file     Gets together: Not on file     Attends religious service: Not on file     Active member of club or organization: Not on file     Attends meetings of clubs or organizations: Not on file     Relationship status: Not on file Other Topics Concern   ??? Not on file   Social History Narrative    Home: Lives in [place] with husband and 2 kids (twins); originally from [place], then [place] x15 yrs moved here in 2010; recent changes: no changes other than potentially moving dad to come live with her (in [place]) as she is DPOA and managing his health care, going back and forth to [place] to are for dad;  living situation and social supports: good    Family and Relationships: sister lives in [place], one in [place], 2 brothers in [place], dad is in [place] as above. Husband and kids as above.     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 PARoxetine 10 mg tablet Take 1 tablet (10 mg total) by mouth daily.      Allergies   Allergen Reactions   ??? Sulfamethoxazole-Trimethoprim      Eye swelling       Reviewed PMHx, PSHX, SocHx etc with the patient and have documented it in the chart as above.    Review of Systems:   A 14-system review of systems was performed and is negative except as stated in the history of present illness.     Objective:        Vitals:  BP 123/77  ~  Pulse 62  ~ Temp 36.2 ???C (97.1 ???F) (Temporal)  ~ Ht 5' 3.8'' (1.621 m)  ~ Wt 143 lb (64.9 kg)  ~ LMP 07/25/2018  ~ SpO2 98%  ~ BMI 24.70 kg/m???    General:   alert, appears stated age and cooperative   Skin:   ak on nose.    Head:   Normocephalic, without obvious abnormality, atraumatic   Eyes:   conjunctivae/corneas clear. PERRL, EOM's intact. Fundi benign.   Ears:   normal TM's and external ear canals both ears   Nose:  Nares normal. Septum midline. Mucosa normal. No drainage or sinus tenderness.   Throat:  lips, mucosa, and tongue normal; teeth and gums normal   Mouth:   No perioral or gingival cyanosis or lesions.  Tongue is normal in appearance. Neck:  no adenopathy, no carotid bruit, no JVD, supple, symmetrical, trachea midline and thyroid not enlarged, symmetric, no tenderness/mass/nodules   Back:  symmetric, no curvature. ROM normal. No CVA tenderness.   Lungs:   clear to auscultation bilaterally   Heart:   regular rate and rhythm, S1, S2 normal, no murmur, click, rub or gallop   Abdomen:   soft, non-tender; bowel sounds normal; no masses,  no organomegaly   GU:   no lesions, no discharge, no CMT, no adnexal masses B/L.  Breast: No masses. No discharge. No skin changes. No redness.    Musculoskeletal:  Spine ROM normal. Muscular strength intact.   Extremities:  extremities normal, atraumatic, no cyanosis or edema   Pulses:       2+ and symmetric   Lymph Nodes:  cervical, supraclavicular, and axillary nodes normal.   Neurologic:   Alert and oriented X 3, normal strength and tone. Normal symmetric reflexes. Normal coordination and gait   Psychiatric:   oriented to time, place and person, mood and affect are within normal limits   EKG: NSR 50 HR No St seg changes or T wave inversions. As interpreted by myself.   Rudean Curt, LVN present for GU and breast exam.      Hearing Screening    125Hz  250Hz  500Hz  1000Hz  2000Hz  3000Hz  4000Hz  6000Hz  8000Hz    Right ear:  25 30 15 10 10 10  35 40   Left ear:  25 35 20 10 20 20  35 25      Visual Acuity Screening    Right eye Left eye Both eyes   Without correction:      With correction: 20/20 20/20 20/20        No flowsheet data found.  PH-9 TOTAL SCORE [place] 12/04/2014 04/22/2018   Total Score 12 4   Some encounter information is confidential and restricted. Go to Review Flowsheets activity to see all data.         Lab Review:   Lab Results   Component Value Date    NA 140 04/29/2018    K 5.1 04/29/2018    CL 104 04/29/2018    CO2 18 (L) 04/29/2018    BUN 16 04/29/2018    CALCIUM 9.3 04/29/2018    ALKPHOS 44 04/29/2018    AST 32 04/29/2018    ALT 33 04/29/2018    BILITOT 0.4 04/29/2018    GLUCOSE 70 04/29/2018 Lab Results   Component Value Date    WBC 6.29 04/29/2018    HGB 13.1 04/29/2018    HCT 41.2 04/29/2018    PLT 333 04/29/2018    RBC 4.30 04/29/2018    MCV 95.8 04/29/2018  Lab Results   Component Value Date    CHOL 214 04/29/2018    CHOLDLCAL 150 (H) 04/29/2018    CHOLHDL 53 04/29/2018    TRIGLY 54 04/29/2018    NOHDLCHOCAL 161 (H) 04/29/2018       not applicable       Assessment & Plan:        1. Routine general medical examination at a health care facility    - TBOC - Screening Audiometry  - TBOC - Visual Acuity Screening  - POCT urinalysis dipstick; Future  - POCT urinalysis dipstick  - ECG 12-Lead Clinic Performed    2. Screening for endocrine disorder  Did already    3. Diabetes mellitus screening  Did already    4. Screening for hyperlipidemia  Did already but needs recheck    5. Encounter for hearing examination, unspecified whether abnormal findings    - TBOC - Screening Audiometry    6. Routine eye exam    - TBOC - Visual Acuity Screening    7. Encounter for screening for cervical cancer     - Pap smear 30+ yo (with HPV), Thinprep; Future  - Pap smear 30+ yo (with HPV), Thinprep  - HPV DNA PCR,Genital  - Pap Smear    8. Mild depression (HCC/RAF)  Stable  - ECG 12-Lead Clinic Performed    9. Mixed hyperlipidemia    - Comprehensive Metabolic Panel; Future  - Lipid Panel; Future    10. Infertility, female  Pt has history of cyst.  Pt wanting to get pregnant again potentially  - Estradiol; Future  - FSH; Future  - LH; Future    11. History of ovarian cyst  Pt has some mild discomfort on the right side of pelvis. No CMT.    - US pelvis transvaginal non-ob; Future    12. AK (actinic keratosis)  Will return to treat another day on her nose.     Follow up: No follow-ups on file.        Immunization status: Needs to bring in records. .     I spent a total of 45 minutes face to face with the patient of which greater than 50% of that time was spent in counseling/coordination. Topics of my discussion are in my note.      The above plan of care, diagnosis, orders, and follow-up were discussed with the patient.  Questions related to this recommended plan of care were answered.       Author:  Brandy Hale, DO 08/11/2018 2:27 PM

## 2018-08-12 LAB — HPV DNA PCR: HPV TYPE 16: NEGATIVE

## 2018-08-14 ENCOUNTER — Other Ambulatory Visit: Payer: Self-pay | Admitting: Physician Assistant

## 2018-08-14 DIAGNOSIS — R21 Rash and other nonspecific skin eruption: Secondary | ICD-10-CM

## 2018-08-17 ENCOUNTER — Encounter: Payer: Self-pay | Admitting: Physician Assistant

## 2018-08-18 ENCOUNTER — Ambulatory Visit: Payer: BLUE CROSS/BLUE SHIELD

## 2018-08-18 DIAGNOSIS — N979 Female infertility, unspecified: Secondary | ICD-10-CM

## 2018-08-18 DIAGNOSIS — Z789 Other specified health status: Secondary | ICD-10-CM

## 2018-08-18 DIAGNOSIS — E782 Mixed hyperlipidemia: Secondary | ICD-10-CM

## 2018-08-18 DIAGNOSIS — L570 Actinic keratosis: Secondary | ICD-10-CM

## 2018-08-18 DIAGNOSIS — N6012 Diffuse cystic mastopathy of left breast: Secondary | ICD-10-CM

## 2018-08-18 DIAGNOSIS — N6011 Diffuse cystic mastopathy of right breast: Secondary | ICD-10-CM

## 2018-08-18 LAB — Comprehensive Metabolic Panel
ALKALINE PHOSPHATASE: 42 U/L (ref 37–113)
TOTAL PROTEIN: 7.5 g/dL (ref 6.1–8.2)

## 2018-08-18 LAB — Lipid Panel: TRIGLYCERIDES: 74 mg/dL (ref <150)

## 2018-08-18 NOTE — Progress Notes
Patient: Vanessa Terry  DOB: [birth year]  MRN: [id number]  Date: 08/18/2018       Chief Complaint:   Chief Complaint   Patient presents with   ??? Follow-up     labs, follow up on last visit       Subjective   Pt is a 48yo female that is here for skin lesion freezing on her nose. Pt and I discussed the procedure risks. Pt signed consent.         PMHX, PSHX, Meds, Allergies, Soc Hx, and etc were all reviewed today.     No additional contributory past medical, surgical, family or social history except as documented in care connect.   Complete 14 pointReview of Systems - ROS negative except as documented above     Past Medical History:   Diagnosis Date   ??? Anemia    ??? Anxiety    ??? Coccyx pain    ??? Constipation    ??? Depression    ??? IBS (irritable bowel syndrome)    ??? Tinnitus of left ear    ??? Vertigo      Patient Active Problem List   Diagnosis   ??? Tinnitus of left ear   ??? Vertigo   ??? Coccyx pain   ??? Toe pain, left   ??? History of in vitro fertilization   ??? Abscess   ??? Hyperlipidemia, unspecified   ??? Mood changes   ??? Family history of colorectal cancer   ??? Family history of colon cancer requiring screening colonoscopy   ??? Depression       Meds:  Outpatient Medications Prior to Visit   Medication Sig   ??? PARoxetine 10 mg tablet Take 1 tablet (10 mg total) by mouth daily.     No facility-administered medications prior to visit.        Allergies   Allergen Reactions   ??? Sulfamethoxazole-Trimethoprim      Eye swelling            Objective   Review of Systems   ROS Normal unless checked below:    Check if Abnormal  Note Positive Findings    []  Constit      []  Eyes      []  ENT      []  Thyroid     []  Resp      []  Imm/All      []  CV      []  GI      []  GU      []  Endo      []  MS      [x]  Skin      []  Heme/Lymph      []  Neuro      []  Psych     []  Other       Vitals: BP 115/75  ~ Pulse 54  ~ Temp 36.3 ???C (97.3 ???F) (Temporal)  ~ Ht 5' 3'' (1.6 m)  ~ Wt 140 lb 6.4 oz (63.7 kg)  ~ LMP 07/25/2018  ~ SpO2 100%  ~ BMI 24.87 kg/m???   Gen: NAD Skin: two AK's on nose.   Neuro: Alert and Oriented X 3.     Pt changed her mind and wants to see dermatologist.  No procedure done.     PH-9 TOTAL SCORE [place] 12/04/2014 04/22/2018   Total Score 12 4   Some encounter information is confidential and restricted. Go to Review Flowsheets activity to see all data.       Labs/Studies:  Lab Results   Component Value Date    NA 140 04/29/2018    K 5.1 04/29/2018    CL 104 04/29/2018    CO2 18 (L) 04/29/2018    BUN 16 04/29/2018    CALCIUM 9.3 04/29/2018    ALKPHOS 44 04/29/2018    AST 32 04/29/2018    ALT 33 04/29/2018    BILITOT 0.4 04/29/2018    GLUCOSE 70 04/29/2018     Lab Results   Component Value Date    WBC 6.29 04/29/2018    HGB 13.1 04/29/2018    HCT 41.2 04/29/2018    PLT 333 04/29/2018    RBC 4.30 04/29/2018    MCV 95.8 04/29/2018         There is no immunization history on file for this patient.    Assessment /Plan     1. AK (actinic keratosis)  Pt changed her mind about freezing it today and wants to see dermatologist.  Referral will be placed.     2. Infertility, female    - LH  - FSH  - Estradiol    3. Mixed hyperlipidemia    - Lipid Panel  - Comprehensive Metabolic Panel    4. Fibrocystic breast changes of both breasts    - Basic Metabolic Panel    5. History of therapy for infertility    - Basic Metabolic Panel      Pt did her labs today.       No follow-ups on file.    Brandy Hale, DO         The above plan of care, diagnosis, orders, and follow-up were discussed with the patient.  Questions related to this recommended plan of care were answered.

## 2018-08-19 LAB — Estradiol: ESTRADIOL: 26 pg/mL

## 2018-08-19 LAB — Follicle Stimulating Hormone: FSH: 11.3 m[IU]/mL

## 2018-08-19 LAB — LH: LH: 5.3 m[IU]/mL

## 2018-08-20 LAB — Liquid-based pap smear

## 2018-08-22 ENCOUNTER — Telehealth: Payer: PRIVATE HEALTH INSURANCE

## 2018-08-22 ENCOUNTER — Other Ambulatory Visit: Payer: Self-pay | Admitting: Physician Assistant

## 2018-08-22 DIAGNOSIS — R05 Cough: Secondary | ICD-10-CM

## 2018-08-22 DIAGNOSIS — R053 Chronic cough: Secondary | ICD-10-CM

## 2018-08-22 NOTE — Telephone Encounter
Dr.Milanes-Skopp,    Patient called requesting her pap smear results. If you can please email her the results through the portal.      Thank you,  Sharyn Lull

## 2018-08-23 NOTE — Telephone Encounter
Reply by: Marice Potter  Please give them to her.

## 2018-08-23 NOTE — Telephone Encounter
Called and spoke with pt she said it was fine to mail out the results to her.     Thank you     No further action required

## 2018-08-25 ENCOUNTER — Ambulatory Visit: Payer: BLUE CROSS/BLUE SHIELD | Attending: Student in an Organized Health Care Education/Training Program

## 2018-08-25 IMAGING — MR MR HEAD WO/W CM
8 of 10 series · 36 of 48 positions shown · IV contrast (multihance)
Comparison: None.

CLINICAL DATA: Chronic tension type headache, not intractable

EXAM:
MRI HEAD WITHOUT AND WITH CONTRAST
TECHNIQUE: Multiplanar, multiecho pulse sequences of the brain and surrounding
structures were obtained without and with intravenous contrast.
CONTRAST:  20mL MULTIHANCE GADOBENATE DIMEGLUMINE 529 MG/ML IV SOLN

[Series 2: T1 · sagittal · 5.0mm · 0.90mm/px · 4 of 27 slices shown (1 of 2)]
[im 1/27]
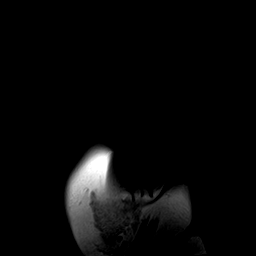
[im 9/27]
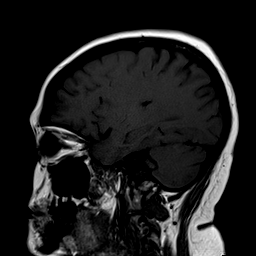
[im 18/27]
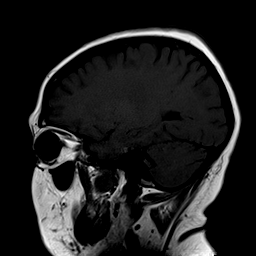
[im 27/27]
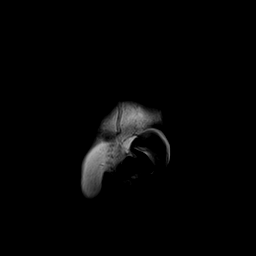

[Series 5: T2 · axial · 5.0mm · 0.69mm/px · z∈[-67,+95]mm · 3 of 28 slices shown (1 of 3)]
[im 1/28]
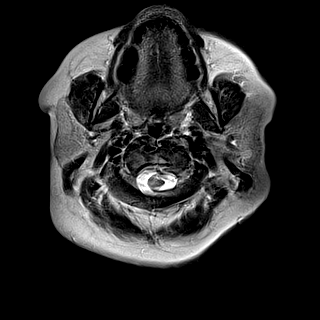
[im 14/28]
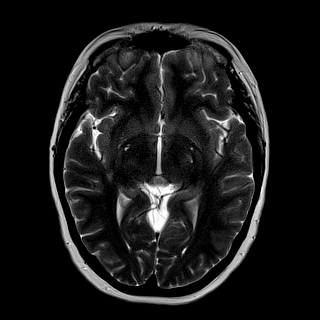
[im 28/28]
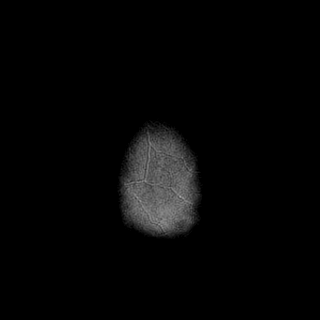

[Series 7: T2 · axial · 5.0mm · 0.43mm/px · z∈[-67,+95]mm · 3 of 28 slices shown (2 of 3)]
[im 1/28]
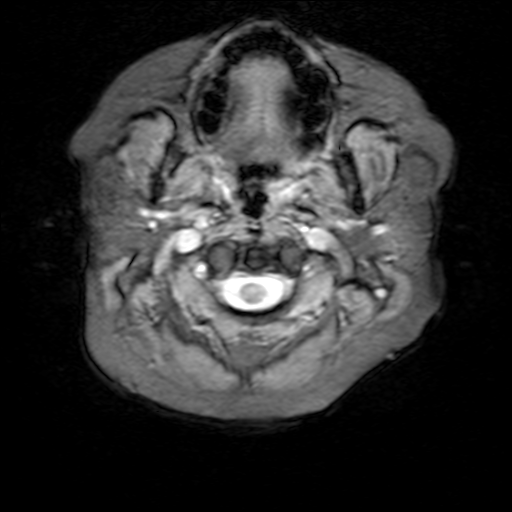
[im 14/28]
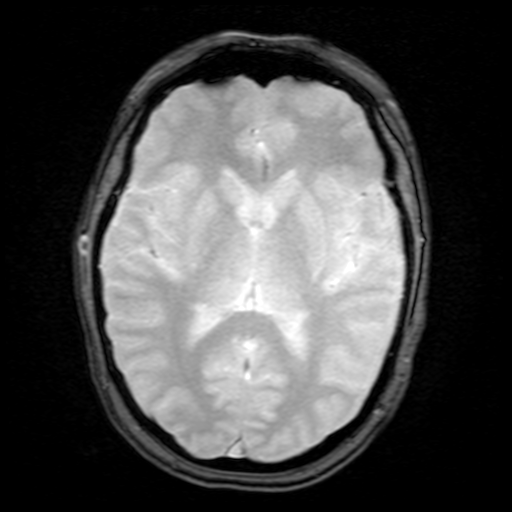
[im 28/28]
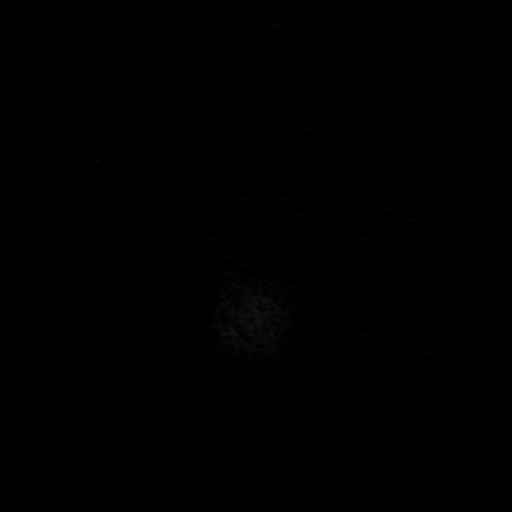

[Series 8: DWI · axial · 3.0mm · 1.88mm/px · z∈[-64,+91]mm · 10 of 96 slices shown (1 of 2)]
[im 1/96]
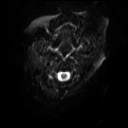
[im 11/96]
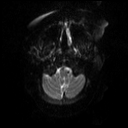
[im 22/96]
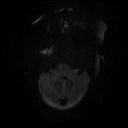
[im 32/96]
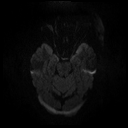
[im 43/96]
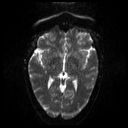
[im 53/96]
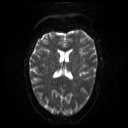
[im 64/96]
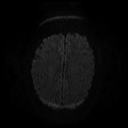
[im 74/96]
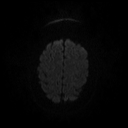
[im 85/96]
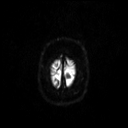
[im 96/96]
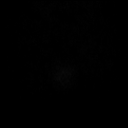

[Series 9: DWI · axial · 3.0mm · 1.88mm/px · z∈[-64,+91]mm · 5 of 48 slices shown (2 of 2)]
[im 1/48]
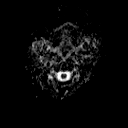
[im 12/48]
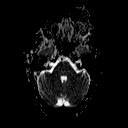
[im 24/48]
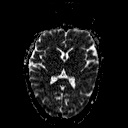
[im 36/48]
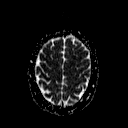
[im 48/48]
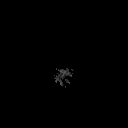

[Series 10: FLAIR · axial · 3.0mm · 0.43mm/px · z∈[-68,+96]mm · 5 of 42 slices shown]
[im 1/42]
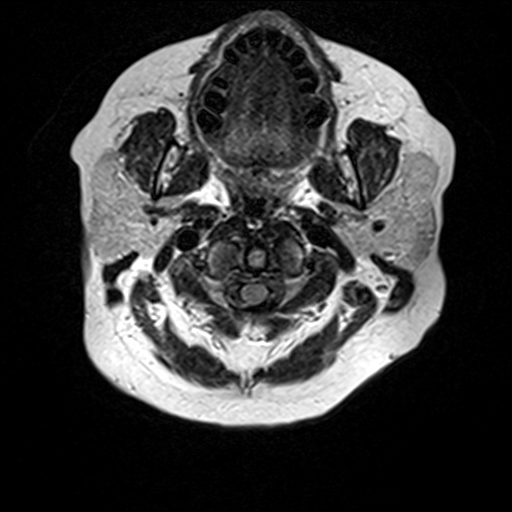
[im 11/42]
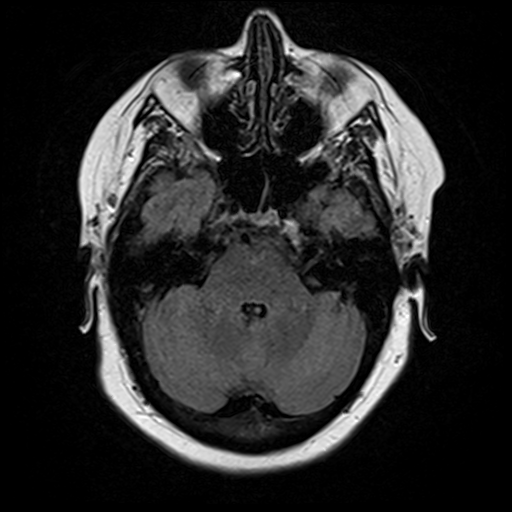
[im 21/42]
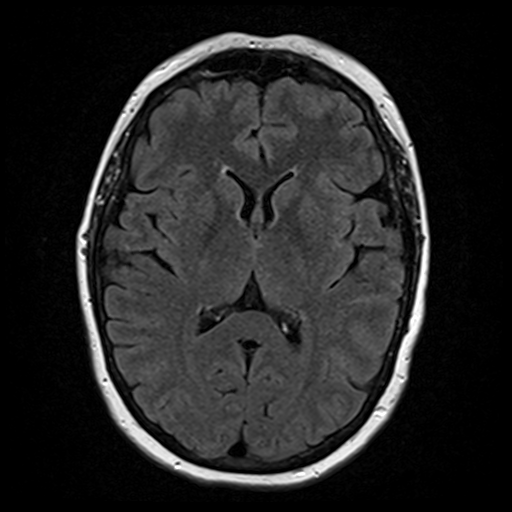
[im 31/42]
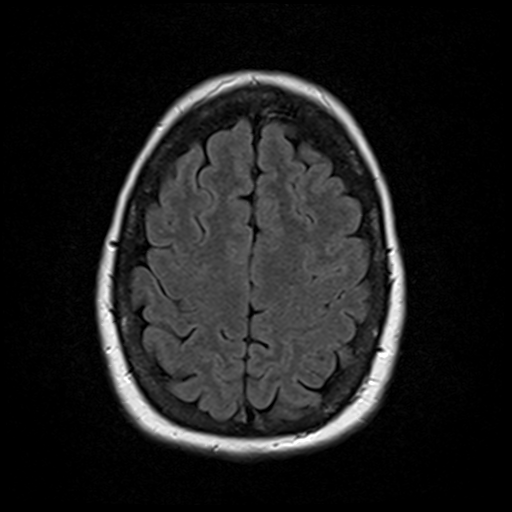
[im 42/42]
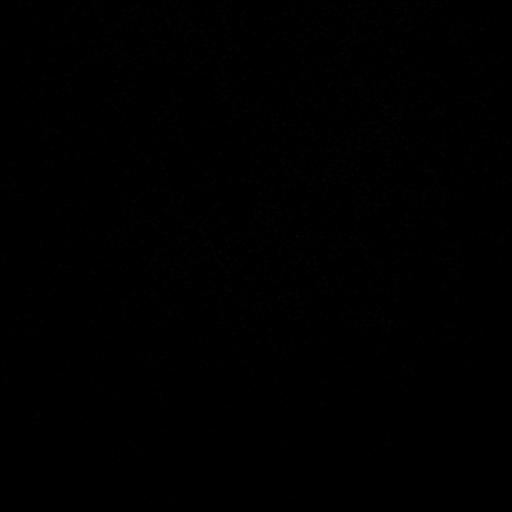

[Series 12: T2 · coronal · 5.0mm · 0.69mm/px · 3 of 30 slices shown (3 of 3)]
[im 1/30]
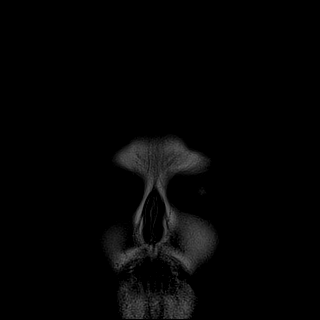
[im 15/30]
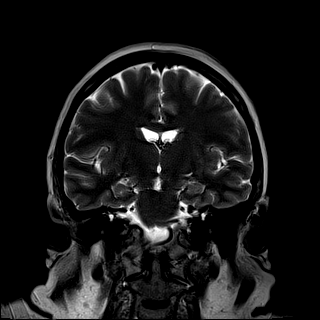
[im 30/30]
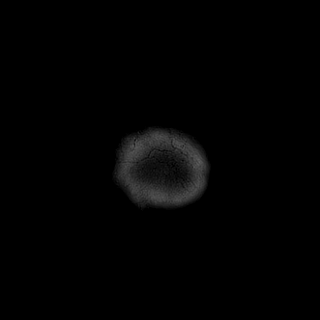

[Series 14: T1 · coronal · 5.0mm · 0.86mm/px · 3 of 30 slices shown (2 of 2)]
[im 1/30]
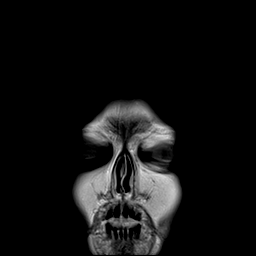
[im 15/30]
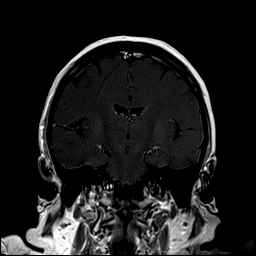
[im 30/30]
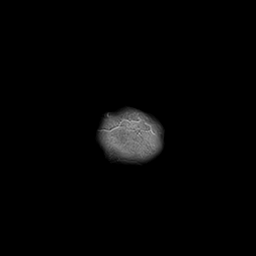

[36 of 48 positions shown; findings below may reference images not displayed]

FINDINGS: Brain: Ventricle size and cerebral volume normal. Negative for acute
infarct. Small hyperintensity left parietal white matter associated
with perivascular spaces. Negative for hemorrhage or mass

Normal enhancement postcontrast infusion.

Vascular: Normal arterial flow voids

Skull and upper cervical spine: Negative

Sinuses/Orbits: Negative

Other: None
IMPRESSION: Negative MRI head with contrast.

## 2018-08-25 NOTE — Progress Notes
PATIENT: Vanessa Terry  MRN: [id number]  DOB: 1970/11/03  DATE OF SERVICE: 08/25/2018    REFERRING PRACTITIONER: Brandy Hale.,*  PRIMARY CARE PROVIDER: Brandy Hale., DO  CHIEF COMPLAINT: No chief complaint on file.      Subjective:     Vanessa Terry is a 48 y.o. female who was seen in clinic for ***.    Last seen in clinic on 06/04/17 by Dr. Kassie Mends for AKs on the R cheek, nevi/lentigines, inflamed EIC on L buttock.      SHx:    Past Medical History:   Diagnosis Date   ??? Anemia    ??? Anxiety    ??? Coccyx pain    ??? Constipation    ??? Depression    ??? IBS (irritable bowel syndrome)    ??? Tinnitus of left ear    ??? Vertigo      Past Surgical History:   Procedure Laterality Date   ??? CESAREAN SECTION  2014   ??? KNEE SURGERY  1994    in [place], arthroscopic   ??? LAPAROSCOPY DIAGNOSTIC / BIOPSY / ASPIRATION / LYSIS  2011   ??? MYOMECTOMY  2012    [facility]   ??? OVARY SURGERY  2011   ??? REMOVAL OF R OVARIAN CYST  2011    [facility]      Family History   Problem Relation Age of Onset   ??? Aortic stenosis Mother         s/p AV valve replacement   ??? Atrial fibrillation Mother         deceased from cardiosynch procedure   ??? Breast cancer Mother 66   ??? Alcohol abuse Mother    ??? Arthritis Mother    ??? Depression Mother    ??? Hyperlipidemia Mother    ??? Hypertension Mother    ??? Stroke Mother    ??? Other (myocardial infarction) Mother    ??? Aortic stenosis Cousin         s/p AV valve replacement   ??? Meniere's disease Other         niece   ??? Colon cancer Maternal Uncle 45   ??? Rectal cancer Sister 11   ??? Depression Sister    ??? Alcohol abuse Sister    ??? Arthritis Sister    ??? Atrial fibrillation Father    ??? Heart failure Father    ??? Melanoma Father    ??? Alcohol abuse Father    ??? Arthritis Father    ??? Diabetes type II Father    ??? Hyperlipidemia Father    ??? Hypertension Father    ??? Kidney disease Father    ??? Learning disabilities Father    ??? Stroke Father    ??? Other (myocardial infarction) Father    ??? Hypertension Brother ? Learning disabilities Brother    ? Alcohol abuse Brother    ? Arthritis Brother    ? Arthritis Paternal Grandmother    ? Alcohol abuse Paternal Grandfather    ? Arthritis Paternal Grandfather    ? Alcohol abuse Sister    ? Arthritis Sister    ? Diabetes type II Brother    ? Arthritis Brother    ? Other (congenital cataract) Daughter    ? Club foot Niece    ? Other (aortic valve replaced) Maternal Cousin      Social History     Socioeconomic History   ? Marital status: Married     Spouse name: Not on file   ?  Number of children: Not on file   ? Years of education: Not on file   ? Highest education level: Not on file   Occupational History   ? Not on file   Social Needs   ? Financial resource strain: Not on file   ? Food insecurity:     Worry: Not on file     Inability: Not on file   ? Transportation needs:     Medical: Not on file     Non-medical: Not on file   Tobacco Use   ? Smoking status: Former Smoker   ? Smokeless tobacco: Never Used   ? Tobacco comment: quit 10 yrs ago, smoked socially x15 yrs   Substance and Sexual Activity   ? Alcohol use: No     Alcohol/week: 0.0 oz   ? Drug use: No   ? Sexual activity: Yes     Partners: Male     Birth control/protection: None   Lifestyle   ? Physical activity:     Days per week: 2 days     Minutes per session: 30 min   ? Stress: Rather much   Relationships   ? Social connections:     Talks on phone: Not on file     Gets together: Not on file     Attends religious service: Not on file     Active member of club or organization: Not on file     Attends meetings of clubs or organizations: Not on file     Relationship status: Not on file   Other Topics Concern   ? Not on file   Social History Narrative    Home: Lives in [place] with husband and 2 kids (twins); originally from [place], then [place] x15 yrs moved here in 2010; recent changes: no changes other than potentially moving dad to come live with her (in [place]) as she is DPOA and managing his health care, going back and forth to [place] to are for dad;  living situation and social supports: good    Family and Relationships: sister lives in [place], one in [place], 2 brothers in [place], dad is in [place] as above. Husband and kids as above.     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     Allergies   Allergen Reactions   ? Sulfamethoxazole-Trimethoprim      Eye swelling       Review of Systems:  Constitutional: negative for fevers, chills, and night sweats   Skin:  Otherwise negative     Objective:     General: Pleasant, in no acute distress, appears stated age  Psychiatric: Mood and affect are within normal limits. Alert and oriented x 3  Skin Exam:  Skin Type: ***    Skin exam performed as listed below.  Scalp: examined  Head/face: examined  Eyes (lids/conjunctiva): examined  Lips: examined  Neck: examined  Chest/breasts/axillae: examined  Back: examined  Abdomen: examined  Buttocks: examined  L upper extremity: examined  R upper extremity: examined  L lower extremity: examined  R lower extremity: examined  Digits/nails: examined      All areas examined were within normal limits with the following exceptions: ***      Assessment/Plan     1. ***  RTC *** months or sooner prn     The patient was seen and examined with the attending, Dr. Annye Rusk who agrees with the above assessment and plan.    Signed: Carma Leaven, MD, PGY2, 08/25/2018 7:41 AM

## 2018-08-30 ENCOUNTER — Ambulatory Visit: Payer: BLUE CROSS/BLUE SHIELD

## 2018-08-30 ENCOUNTER — Telehealth: Payer: Commercial Managed Care - PPO

## 2018-08-30 NOTE — Telephone Encounter
Spoke to patient and relayed message.

## 2018-08-30 NOTE — Telephone Encounter
Reply by: Marice Potter  If pt thinks it is her appendix, then she needs to go to ER to be further examined and imaging. [place] of pelvis will not look at her appendix.

## 2018-08-30 NOTE — Telephone Encounter
Dr.Milanes-Skopp,    Patient called stating she thinks the pain she has been experiencing in her lower right side might have to do with her appendix and not an ovarian cyst like you both came to the initial conclusion. Patient just wants to know when she has her ultrasound done tomorrow that she will also be having her appendix checked during this time as well.    Please advise.    Pt cb # [phone]    Thanks,  Sharyn Lull

## 2018-08-31 ENCOUNTER — Inpatient Hospital Stay: Payer: BLUE CROSS/BLUE SHIELD

## 2018-08-31 DIAGNOSIS — Z8742 Personal history of other diseases of the female genital tract: Secondary | ICD-10-CM

## 2018-09-01 ENCOUNTER — Telehealth: Payer: BLUE CROSS/BLUE SHIELD

## 2018-09-01 ENCOUNTER — Encounter: Payer: Self-pay | Admitting: Physician Assistant

## 2018-09-01 DIAGNOSIS — R102 Pelvic and perineal pain: Secondary | ICD-10-CM

## 2018-09-02 NOTE — Telephone Encounter
LVM for the patient to callback and set up a nurse visit (labs in [place].) OBGYN referral has been processed and has been mailed to the patient.    Upon callback, please set up a nurse visit.

## 2018-09-02 NOTE — Progress Notes
D/w pt [place] of pelvis. Will refer to gyn Dr. Dorene Ar per pt request. Pt will have CT scan for ab/pelvis with contrast.  Pt will do BMP prior with UPT.  Pt told if has severe RLQ pain to go to an ER to be evaluated sooner.

## 2018-09-05 ENCOUNTER — Ambulatory Visit: Payer: PRIVATE HEALTH INSURANCE | Admitting: Physician Assistant

## 2018-09-06 ENCOUNTER — Inpatient Hospital Stay: Payer: BLUE CROSS/BLUE SHIELD

## 2018-09-06 ENCOUNTER — Ambulatory Visit: Payer: Commercial Managed Care - PPO

## 2018-09-06 ENCOUNTER — Other Ambulatory Visit: Payer: Self-pay | Admitting: Physician Assistant

## 2018-09-06 DIAGNOSIS — R21 Rash and other nonspecific skin eruption: Secondary | ICD-10-CM

## 2018-09-07 ENCOUNTER — Ambulatory Visit: Payer: BLUE CROSS/BLUE SHIELD

## 2018-09-07 ENCOUNTER — Ambulatory Visit: Payer: Commercial Managed Care - PPO

## 2018-09-07 NOTE — Telephone Encounter (Signed)
Please advise 

## 2018-09-08 NOTE — Telephone Encounter
Have pt come in to pick up their results.

## 2018-09-08 NOTE — Telephone Encounter
Patient called and relayed MD's message.    Patient will come by for pick up whenever she is available.    Thanks,  Sharyn Lull

## 2018-09-09 ENCOUNTER — Encounter: Payer: Self-pay | Admitting: Physician Assistant

## 2018-09-09 ENCOUNTER — Other Ambulatory Visit: Payer: Self-pay

## 2018-09-09 ENCOUNTER — Ambulatory Visit (INDEPENDENT_AMBULATORY_CARE_PROVIDER_SITE_OTHER): Payer: PRIVATE HEALTH INSURANCE | Admitting: Physician Assistant

## 2018-09-09 ENCOUNTER — Ambulatory Visit (INDEPENDENT_AMBULATORY_CARE_PROVIDER_SITE_OTHER): Payer: PRIVATE HEALTH INSURANCE

## 2018-09-09 VITALS — BP 127/82 | HR 100 | Temp 98.7°F | Ht 64.0 in

## 2018-09-09 DIAGNOSIS — M7918 Myalgia, other site: Secondary | ICD-10-CM

## 2018-09-09 DIAGNOSIS — B3731 Acute candidiasis of vulva and vagina: Secondary | ICD-10-CM

## 2018-09-09 DIAGNOSIS — L4050 Arthropathic psoriasis, unspecified: Secondary | ICD-10-CM

## 2018-09-09 DIAGNOSIS — R410 Disorientation, unspecified: Secondary | ICD-10-CM

## 2018-09-09 DIAGNOSIS — M79645 Pain in left finger(s): Secondary | ICD-10-CM

## 2018-09-09 DIAGNOSIS — R05 Cough: Secondary | ICD-10-CM

## 2018-09-09 DIAGNOSIS — R253 Fasciculation: Secondary | ICD-10-CM

## 2018-09-09 DIAGNOSIS — J181 Lobar pneumonia, unspecified organism: Secondary | ICD-10-CM

## 2018-09-09 DIAGNOSIS — R600 Localized edema: Secondary | ICD-10-CM

## 2018-09-09 DIAGNOSIS — R053 Chronic cough: Secondary | ICD-10-CM

## 2018-09-09 DIAGNOSIS — R4789 Other speech disturbances: Secondary | ICD-10-CM

## 2018-09-09 DIAGNOSIS — G8929 Other chronic pain: Secondary | ICD-10-CM

## 2018-09-09 DIAGNOSIS — Z13220 Encounter for screening for lipoid disorders: Secondary | ICD-10-CM

## 2018-09-09 DIAGNOSIS — R413 Other amnesia: Secondary | ICD-10-CM

## 2018-09-09 DIAGNOSIS — L281 Prurigo nodularis: Secondary | ICD-10-CM

## 2018-09-09 DIAGNOSIS — B373 Candidiasis of vulva and vagina: Secondary | ICD-10-CM

## 2018-09-09 DIAGNOSIS — J189 Pneumonia, unspecified organism: Secondary | ICD-10-CM

## 2018-09-09 MED ORDER — BUMETANIDE 0.5 MG PO TABS: 0.5000 mg | ORAL_TABLET | Freq: Two times a day (BID) | ORAL | 1 refills | Status: DC

## 2018-09-09 MED ORDER — CYCLOBENZAPRINE HCL 10 MG PO TABS: 10.0000 mg | ORAL_TABLET | Freq: Three times a day (TID) | ORAL | 3 refills | Status: DC | PRN

## 2018-09-09 MED ORDER — FLUCONAZOLE 150 MG PO TABS: 150.0000 mg | ORAL_TABLET | Freq: Once | ORAL | 0 refills | Status: AC

## 2018-09-09 MED ORDER — CLINDAMYCIN PHOSPHATE 1 % EX LOTN: TOPICAL_LOTION | Freq: Two times a day (BID) | CUTANEOUS | 1 refills | Status: DC

## 2018-09-09 MED ORDER — HYDROCODONE-ACETAMINOPHEN 5-325 MG PO TABS: ORAL_TABLET | ORAL | 0 refills | Status: DC

## 2018-09-09 NOTE — Telephone Encounter (Signed)
Can we look into saxenda coverage. Ok to resend to pharmacy if that is needed.

## 2018-09-09 NOTE — Progress Notes (Signed)
Right lung pneumonia appears resolved. No infiltrate on the right lung.

## 2018-09-09 NOTE — Progress Notes (Signed)
Subjective:    Patient ID: Sarah Mayer, female    DOB: 1970-03-02, 48 y.o.   MRN: [id number]  HPI  Pt is a 48 yo obese female with migraines, hypothyroidism, CFS, psoriatic arthritis, hyperparathyroidism who presents to the clinic to follow up after ED visit on 6/26 that lead to admission then discharge 08/20/18. COVID was negative. CXR showed right lower lung infiltrates. She was treated with levaquin and zpak per patient. She just finished abx. Confirmed no blood clots. Pt has had a cough since November 2019. Cough is worse at night. Seems a little bit better today. She is not having to use albuterol inhaler. She is not using symbicort. Reports GERD being controlled. She does not take carafate very often.   She is worried "about brain". She has multiple concerning things over the past few months. She will randomly start to jerk. She continues to have brain fog symptoms. Trintellix improved but not went away. She has episodes of word loss. She will randomly forget how to spell things. She will have times where she forgets where she is at and what she is doing. Her husband became really scared when for about 1 hour all she spoke was jibberish but in her mind she was talking to him. No facial asymmetry, word slurring, extremity loss of function. Denies any hallucinations.she does not hear voices but hears people arguing that are not there.   She has multiple sores on legs. She scratches and picks them open. Wonders what to do.   .. Active Ambulatory Problems    Diagnosis Date Noted  . Psoriatic arthritis (HCC) 03/24/2016  . Gastroesophageal reflux disease 03/24/2016  . GAD (generalized anxiety disorder) 03/24/2016  . Iron deficiency anemia 03/24/2016  . History of pulmonary embolism 03/27/2016  . History of DVT (deep vein thrombosis) 03/27/2016  . Chronic tension-type headache, not intractable 03/27/2016  . Primary insomnia 03/27/2016  . Bilateral lower extremity edema 03/27/2016  .  Chronic pain syndrome 05/25/2016  . Morbid obesity (HCC) 05/27/2016  . Serum calcium elevated 08/30/2016  . Diarrhea 08/30/2016  . Collagenous colitis 10/18/2016  . Barrett's esophagus without dysplasia 10/18/2016  . Arthralgia 10/18/2016  . Rash and nonspecific skin eruption 10/18/2016  . Memory changes 11/23/2016  . Major depression, recurrent, chronic (HCC) 11/23/2016  . OSA (obstructive sleep apnea) 11/23/2016  . Twitching 12/22/2016  . Cough, persistent 12/22/2016  . Bilateral hip pain 12/22/2016  . Tripping over things 01/12/2017  . Left arm pain 01/12/2017  . Left elbow pain 01/12/2017  . Numbness and tingling in left arm 01/17/2017  . Word finding difficulty 03/15/2017  . Intractable migraine without aura and with status migrainosus 03/15/2017  . Edema of both feet 07/04/2017  . B12 deficiency 07/04/2017  . Acquired hypothyroidism 07/04/2017  . Vitamin D deficiency 07/04/2017  . Secondary hyperparathyroidism, non-renal (HCC) 07/04/2017  . Psoriasis 07/06/2017  . CFS (chronic fatigue syndrome) 07/06/2017  . Easy bruising 07/26/2017  . Myofascial pain 08/19/2017  . Itching 08/19/2017  . Yeast infection 08/19/2017  . IBS (irritable bowel syndrome) 12/03/2017  . Recurrent falls 12/29/2017  . Acute bronchitis 02/07/2018  . Weakness 02/11/2018  . 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 Urinary frequency 02/14/2018  . Chronic left shoulder pain 03/15/2018  . Chronic pain of left thumb 03/15/2018  . 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 Jerking 09/12/2018  . Episode of confusion 09/12/2018   Resolved Ambulatory Problems    Diagnosis Date Noted  . Hyperparathyroidism, primary (HCC) 09/18/2016   Past Medical  History:  Diagnosis Date  . Microscopic colitis   . Migraine      Review of Systems See HPI.    Objective:   Physical Exam Vitals signs reviewed.  Constitutional:      Appearance: Normal appearance. She is obese.  HENT:     Head: Normocephalic.  Cardiovascular:      Rate and Rhythm: Normal rate and regular rhythm.  Pulmonary:     Effort: Pulmonary effort is normal.     Breath sounds: Normal breath sounds. No wheezing, rhonchi or rales.  Skin:    Comments: Numerous Multiple erythematous nodules over bilateral lower legs in different stages of healing some scabbed, open, closed.   Neurological:     General: No focal deficit present.     Mental Status: She is alert and oriented to person, place, and time.  Psychiatric:        Mood and Affect: Mood normal.           Assessment & Plan:  Marland KitchenMarland KitchenKeeva was seen today for follow-up.  Diagnoses and all orders for this visit:  Community acquired pneumonia of right lower lobe of lung ([place]) -     DG Chest 2 View  Chronic pain of left thumb -     HYDROcodone-acetaminophen (NORCO/VICODIN) 5-325 MG tablet; Take 1-2 tablets as needed at bedtime for moderate to severe pain.  Screening for lipid disorders -     Lipid Panel w/reflex Direct LDL  Memory loss -     Ambulatory referral to Neurology -     US Carotid Duplex Bilateral  Episode of confusion -     Ambulatory referral to Neurology -     US Carotid Duplex Bilateral  Jerking -     Ambulatory referral to Neurology  Bilateral lower extremity edema -     bumetanide (BUMEX) 0.5 MG tablet; Take 1 tablet (0.5 mg total) by mouth 2 (two) times daily.  Cough, persistent  Prurigo nodularis -     clindamycin (CLEOCIN T) 1 % lotion; Apply topically 2 (two) times daily. To bilateral legs.  Myofascial pain -     cyclobenzaprine (FLEXERIL) 10 MG tablet; Take 1 tablet (10 mg total) by mouth 3 (three) times daily as needed for muscle spasms.  Vaginal yeast infection -     fluconazole (DIFLUCAN) 150 MG tablet; Take 1 tablet (150 mg total) by mouth once for 1 dose. Repeat in 48-72 hours if symptoms persist.  Psoriatic arthritis (HCC) -     HYDROcodone-acetaminophen (NORCO/VICODIN) 5-325 MG tablet; Take 1-2 tablets as needed at bedtime for moderate to severe  pain.  Word finding difficulty -     US Carotid Duplex Bilateral   Will get CXR to follow up on pneumonia. Pt has finished abx. Sent diflucan for after abx yeast infection symptoms. persistent cough needs pulmonology work up sent referral the other week. GERD controlled. Allergies seemed to be controlled. Pt not taking symbicort. Needs lung function studies.   Refilled needed medications.   Discussed sores on legs. Use clobetasol as needed. Gave some topical abx if appear to look like might be getting infection.   ..PDMP reviewed during this encounter. norco refilled.  On pain contract.   MRI done 06/2017. Unclear etiology. Does not sound classic of TIA but will get carotid dopplers and make referral to neurology. Will get up to date lipid.

## 2018-09-09 NOTE — Patient Instructions (Addendum)
Transient Ischemic Attack  A transient ischemic attack (TIA) is a "warning stroke" that causes stroke-like symptoms that go away quickly. A TIA does not cause lasting damage to the brain. But having a TIA is a sign that you may be at risk for a stroke. Lifestyle changes and medical treatments can help prevent a stroke. It is important to know the symptoms of a TIA and what to do. Get help right away, even if your symptoms go away. The symptoms of a TIA are the same as those of a stroke. They can happen fast, and they usually go away within minutes or hours. They can include:  Weakness or loss of feeling in your face, arm, or leg. This often happens on one side of your body.  Trouble walking.  Trouble moving your arms or legs.  Trouble talking or understanding what people are saying.  Trouble seeing.  Seeing two of one object (double vision).  Feeling dizzy.  Feeling confused.  Loss of balance or coordination.  Feeling sick to your stomach (nauseous) and throwing up (vomiting).  A very bad headache for no reason. What increases the risk? Certain things may make you more likely to have a TIA. Some of these are things that you can change, such as:  Being very overweight (obese).  Using products that contain nicotine or tobacco, such as cigarettes and e-cigarettes.  Taking birth control pills.  Not being active.  Drinking too much alcohol.  Using drugs. Other risk factors include:  Having an irregular heartbeat (atrial fibrillation).  Being African American or Hispanic.  Having had blood clots, stroke, TIA, or heart attack in the past.  Being a woman with a history of high blood pressure in pregnancy (preeclampsia).  Being over the age of 60.  Being female.  Having family history of stroke.  Having the following diseases or conditions: ? High blood pressure. ? High cholesterol. ? Diabetes. ? Heart disease. ? Sickle cell disease. ? Sleep apnea. ? Migraine  headache. ? Long-term (chronic) diseases that cause soreness and swelling (inflammation). ? Disorders that affect how your blood clots. Follow these instructions at home: Medicines   Take over-the-counter and prescription medicines only as told by your doctor.  If you were told to take aspirin or another medicine to thin your blood, take it exactly as told by your doctor. ? Taking too much of the medicine can cause bleeding. ? Taking too little of the medicine may not work to treat the problem. Eating and drinking   Eat 5 or more servings of fruits and vegetables each day.  Follow instructions from your doctor about your diet. You may need to follow a certain diet to help lower your risk of having a stroke. You may need to: ? Eat a diet that is low in fat and salt. ? Eat foods that contain a lot of fiber. ? Limit the amount of carbohydrates and sugar in your diet.  Limit alcohol intake to 1 drink a day for nonpregnant women and 2 drinks a day for men. One drink equals 12 oz of beer, 5 oz of wine, or 1 oz of hard liquor. General instructions  Keep a healthy weight.  Stay active. Try to get at least 30 minutes of activity on all or most days.  Find out if you have a condition called sleep apnea. Get treatment if needed.  Do not use any products that contain nicotine or tobacco, such as cigarettes and e-cigarettes. If you need help quitting,   ask your doctor.  Do not abuse drugs.  Keep all follow-up visits as told by your doctor. This is important. Get help right away if:  You have any signs of stroke. "BE FAST" is an easy way to remember the main warning signs: ? B - Balance. Signs are dizziness, sudden trouble walking, or loss of balance. ? E - Eyes. Signs are trouble seeing or a sudden change in how you see. ? F - Face. Signs are sudden weakness or loss of feeling of the face, or the face or eyelid drooping on one side. ? A - Arms. Signs are weakness or loss of feeling in an  arm. This happens suddenly and usually on one side of the body. ? S - Speech. Signs are sudden trouble speaking, slurred speech, or trouble understanding what people say. ? T - Time. Time to call emergency services. Write down what time symptoms started.  You have other signs of stroke, such as: ? A sudden, very bad headache with no known cause. ? Feeling sick to your stomach (nausea). ? Throwing up (vomiting). ? Jerky movements that you cannot control (seizure). These symptoms may be an emergency. Do not wait to see if the symptoms will go away. Get medical help right away. Call your local emergency services (911 in the [place] Do not drive yourself to the hospital. Summary  A transient ischemic attack (TIA) is a "warning stroke" that causes stroke-like symptoms that go away quickly.  A TIA is a medical emergency. Get help right away, even if your symptoms go away.  A TIA does not cause lasting damage to the brain.  Having a TIA is a sign that you may be at risk for a stroke. Lifestyle changes and medical treatments can help prevent a stroke. This information is not intended to replace advice given to you by your health care provider. Make sure you discuss any questions you have with your health care provider. Document Released: 11/19/2007 Document Revised: 11/05/2017 Document Reviewed: 05/13/2016 Elsevier Patient Education  [place].    Erythema Nodosum Erythema nodosum is a skin condition in which patches of fat under the skin of the lower legs become inflamed. This causes painful bumps (nodules) to form. What are the causes? This condition may be caused by:  Infections. Strep throat (pharyngitis) is a common cause.  Certain medicines, especially birth control pills, penicillin, and sulfa medicines.  Sarcoidosis.  Pregnancy.  Certain inflammatory conditions, such as Crohn's disease.  Certain cancers. In some cases, the cause may not be known. What increases the  risk? This condition is more likely to develop in young adult women. What are the signs or symptoms? The nodules from erythema nodosum:  Look like raised bruises and are tender to the touch.  Usually appear on the front of the lower legs (shins) and may also appear on the arms or the abdomen.  Gradually change in color from pink to brown.  Leave a dark mark that fades away after several months. Other symptoms besides nodules may include:  Fever.  Fatigue.  Joint pain. How is this diagnosed? This condition is often diagnosed based on your symptoms. You may have a physical exam, X-rays, and blood tests to help find the cause of your erythema nodosum. A skin sample may be removed (skin biopsy) to be examined by a specialist (pathologist). How is this treated? Treatment for this condition depends on the cause. The nodules usually go away after the underlying cause is treated, such  as after medicine is used to fight infection. Treatment may also include:  NSAIDs for pain and inflammation.  Potassium iodide supplements.  Steroid medicines.  Rest. Follow these instructions at home: Activity  Rest and return to your normal activities as told by your health care provider. Ask your health care provider what activities are safe for you.  Avoid very intense (vigorous) exercise until your symptoms go away. General instructions   Take over-the-counter and prescription medicines only as told by your health care provider. If you were prescribed antibiotic medicine, take or apply it as told by your health care provider. Do not stop using the antibiotic even if you start to feel better  If directed, put ice on affected areas to help relieve pain or inflammation: ? Put ice in a plastic bag. ? Place a towel between your skin and the bag. ? Apply ice 2-3 times a day. Do not apply ice for more than 20 minutes at a time.  If possible, raise (elevate) the affected area above the level of your  heart when you are sitting or lying down. This may help with pain and inflammation.  Avoid scratching your skin.  To relieve itchiness, make a paste with dry oatmeal and warm water, then put the paste on itchy areas. Let the paste dry, remove it, and then apply moisturizer. You may do this 2-3 times a day, or as needed.  Keep all follow-up visits as told by your health care provider. This is important. Contact a health care provider if you:  Have symptoms that do not get better with treatment and home care.  Have a fever that does not go away. Get help right away if you:  Have pain that gets worse.  Have a sore throat.  Vomit more than one time. Summary  Erythema nodosum is a skin condition that causes painful bumps (nodules) to form.  The bumps usually appear on the front of the lower legs (shins).  Avoid very intense (vigorous) exercise until your symptoms go away.  Contact a health care provider if you have a fever or if your symptoms do not improve. This information is not intended to replace advice given to you by your health care provider. Make sure you discuss any questions you have with your health care provider. Document Released: 03/19/2004 Document Revised: 01/22/2017 Document Reviewed: 12/15/2016 Elsevier Patient Education  2020 [employer]

## 2018-09-10 LAB — LIPID PANEL W/REFLEX DIRECT LDL
Cholesterol: 140 mg/dL (ref <200)
HDL: 52 mg/dL (ref >=50)
LDL Cholesterol (Calc): 74 mg/dL (calc)
Non-HDL Cholesterol (Calc): 88 mg/dL (calc) (ref <130)
Total CHOL/HDL Ratio: 2.7 (calc) (ref <5.0)
Triglycerides: 67 mg/dL (ref <150)

## 2018-09-12 ENCOUNTER — Encounter: Payer: Self-pay | Admitting: Physician Assistant

## 2018-09-12 DIAGNOSIS — R253 Fasciculation: Secondary | ICD-10-CM | POA: Insufficient documentation

## 2018-09-12 DIAGNOSIS — L281 Prurigo nodularis: Secondary | ICD-10-CM | POA: Insufficient documentation

## 2018-09-12 DIAGNOSIS — R410 Disorientation, unspecified: Secondary | ICD-10-CM | POA: Insufficient documentation

## 2018-09-12 NOTE — Progress Notes (Signed)
Call pt: cholesterol looks great!

## 2018-09-14 ENCOUNTER — Telehealth: Payer: BLUE CROSS/BLUE SHIELD

## 2018-09-14 ENCOUNTER — Encounter: Payer: Self-pay | Admitting: Physician Assistant

## 2018-09-14 NOTE — Telephone Encounter
Forwarded by: Thea Alken      Hey Dr. Dorene Ar,      Would you like for this pt to be schedule with a different provider?

## 2018-09-14 NOTE — Telephone Encounter
Appointment Accommodation Request    MD Name: Dr. Dorene Ar     Appointment Type: return gyn    Reason for sooner request: patient had a pelvic ultrasound done with PCP. Found blood filled cyst and endometrial tissue     Date/Time Requested (If any): within next week or two    Last seen by MD: 04/03/15    Any Symptoms:  [x]  Yes  []  No      ? If yes, what symptoms are you experiencing: pelvic pain  o Duration of symptoms (how long):     Patient was offered an appointment but declined.    Patient was advised to seek emergency services if conditions are urgent or emergent.    Patient has been notified of the 24-48 hour turnaround time.

## 2018-09-14 NOTE — Telephone Encounter (Signed)
Information has been sent to insurance and waiting on a response.   

## 2018-09-15 MED ORDER — SAXENDA 18 MG/3ML ~~LOC~~ SOPN: 3.0000 mg | PEN_INJECTOR | Freq: Every day | SUBCUTANEOUS | 2 refills | Status: DC

## 2018-09-15 NOTE — Telephone Encounter
I spoke with the patient on the phone. Her pelvic ultrasound shows normal physiologic changes in the ovaries related to ovulation. No need for follow up imaging. There is a 3mm hypoechoic area inside the endometrium. This is nonspecific. We reviewed her mammogram results and the recommendation for Breast MRI. I explained that breast MRI is being recommended as an additional screening modality based off of her calculated risk for breast cancer. Okay to schedule this at her convenience. She is still trying to conceive. We discussed that spontaneous conception at age 24 is rare but not impossible. She asked what I thought about clomid. I explained that while this generally can be used as a treatment for unexplained infertility that I would not recommend it for her given the increased risk for twin pregnancy and the increased risks that a twin pregnancy would carry for someone her age.     Total time in consultation: 62min (1:22pm-1:36pm), >50% was spent in counseling and coordination of care.    Nolyn Eilert M. Dorene Ar, MD

## 2018-09-15 NOTE — Telephone Encounter (Signed)
Can we change neurology at her request and then pulmonology somewhere other than [place], please.

## 2018-09-15 NOTE — Telephone Encounter (Signed)
I received a fax from [facility] that a PA is not needed. Can you send this to the pharmacy? Please advise.

## 2018-09-19 NOTE — Telephone Encounter (Signed)
Fowarding to [place] in referrals

## 2018-09-20 ENCOUNTER — Ambulatory Visit: Payer: PRIVATE HEALTH INSURANCE

## 2018-09-20 ENCOUNTER — Other Ambulatory Visit: Payer: Self-pay

## 2018-09-20 NOTE — Progress Notes (Signed)
Great carotid doppler. Great flow. No stenosis.

## 2018-09-21 ENCOUNTER — Ambulatory Visit: Payer: BLUE CROSS/BLUE SHIELD

## 2018-09-21 NOTE — Telephone Encounter (Signed)
I changed neurology to who she requested but I sent her pulmonary to [place] not [facility]

## 2018-09-30 ENCOUNTER — Ambulatory Visit (INDEPENDENT_AMBULATORY_CARE_PROVIDER_SITE_OTHER): Payer: PRIVATE HEALTH INSURANCE | Admitting: Internal Medicine

## 2018-09-30 ENCOUNTER — Encounter: Payer: Self-pay | Admitting: Internal Medicine

## 2018-09-30 ENCOUNTER — Other Ambulatory Visit: Payer: Self-pay

## 2018-09-30 DIAGNOSIS — R05 Cough: Secondary | ICD-10-CM

## 2018-09-30 DIAGNOSIS — R058 Other specified cough: Secondary | ICD-10-CM

## 2018-09-30 MED ORDER — FLUTTER DEVI: 0 refills | Status: DC

## 2018-09-30 NOTE — Patient Instructions (Addendum)
Aciphex 20 mg Take 30- 60 min before your first and last meals of the day   Pepcid 20 mg one hour before bedtime   GERD (REFLUX)  is an extremely common cause of respiratory symptoms just like yours , many times with no obvious heartburn at all.    It can be treated with medication, but also with lifestyle changes including elevation of the head of your bed (ideally with 6-8inch blocks under the headboard of your bed),  Smoking cessation, avoidance of late meals, excessive alcohol, and avoid fatty foods, chocolate, peppermint, colas, red wine, and acidic juices such as orange juice.  NO MINT OR MENTHOL PRODUCTS SO NO COUGH DROPS  USE SUGARLESS CANDY INSTEAD (Jolley ranchers or Stover's or Life Savers) or even ice chips will also do - the key is to swallow to prevent all throat clearing. NO OIL BASED VITAMINS - use powdered substitutes.  Avoid fish oil when coughing.   For drainage / throat tickle try take CHLORPHENIRAMINE  4 mg  (Chlortab 4mg   at [employer] should be easiest to find in the green box)  take one every 4 hours as needed - available over the counter- may cause drowsiness so start with just a  dose or two one hour before before  and see how you tolerate it before trying in daytime    For cough use flutter valve as much as possible   Follow up should be with your GI doctor and here as needed with all your medications in hand

## 2018-09-30 NOTE — Progress Notes (Signed)
Sarah Mayer, female    DOB: March 02, 1970,    MRN: [id number]   Brief patient profile:  48 yowf never smoker never children with never resp allergy symptoms but age 48 did  allergy tests migraines for dog cat feathers molds then repeated in her late 20's for "constant allergies" = nasal congestion/ drainage  recurrent sinus infections  Pos grass, trees, weeds by same group(can't remember who) rec allergy shots/singulair  but couldn't build them up and couldn't tell they helped so stopped ? p a year  and pattern continued then added as needed albuterol since 2019 then onset of cough in  Dec 2019 and much worse since  march/april 2020= more severe and gag / vomit near syncope eventually admit  to [facility] 2018 with "CAP, RLL" dx by pCXR and Covid neg pcr  And d/c and no better so referred to pulmonary clinic 09/30/2018 by Iran Planas.    History of Present Illness  09/30/2018  Pulmonary/ 1st office eval/Asaf Elmquist  Chief Complaint  Patient presents with  . Pulmonary Consult    Referred by Cleda Clarks for cough. Patient reports that she has a dry cough.   Dyspnea:  Hold breath due to pain "all over", nothing pleuritic Cough: daily x dec 2019 / immediately worse noct / worse laughing/ not with meals  Sleep: sleeps rotated on either side flat bed SABA use: not x one month  Has gerd not taking ppi ac/using lots of cough drops   No obvious day to day or daytime variability or assoc excess/ purulent sputum or mucus plugs or hemoptysis or cp or chest tightness, subjective wheeze or overt  hb symptoms.   Sleeping  without nocturnal  or early am exacerbation  of respiratory  c/o's or need for noct saba. Also denies any obvious fluctuation of symptoms with weather or environmental changes or other aggravating or alleviating factors except as outlined above   No unusual exposure hx or h/o childhood pna/ asthma or knowledge of premature birth.  Current Allergies, Complete Past Medical History, Past  Surgical History, Family History, and Social History were reviewed in [employer] record.  ROS  The following are not active complaints unless bolded Hoarseness, sore throat, dysphagia, dental problems, itching, sneezing,  nasal congestion or discharge of excess mucus or purulent secretions, ear ache,   fever, chills, sweats, unintended wt loss or wt gain, classically pleuritic or exertional cp,  orthopnea pnd or arm/hand swelling  or leg swelling, presyncope, palpitations, abdominal pain, anorexia, nausea, vomiting, diarrhea  or change in bowel habits or change in bladder habits, change in stools or change in urine, dysuria, hematuria,  rash, arthralgias, visual complaints, headache, numbness, weakness or ataxia or problems with walking or coordination,  change in mood or  memory.           Past Medical History:  Diagnosis Date  . Chronic pain syndrome 05/25/2016  . Gastroesophageal reflux disease 03/24/2016  . History of DVT (deep vein thrombosis) 03/27/2016  . History of pulmonary embolism 03/27/2016  . IBS (irritable bowel syndrome)   . Microscopic colitis   . Migraine   . Morbid obesity ([place]) 05/27/2016  . Psoriatic arthritis [facility])     Outpatient Medications Prior to Visit  Medication Sig Dispense Refill  . AIMOVIG 140 MG/ML SOAJ ADMINISTER 1 SYRINGE UNDER THE SKIN EVERY 30 DAYS 1 mL 2  . albuterol (PROVENTIL HFA;VENTOLIN HFA) 108 (90 Base) MCG/ACT inhaler Inhale 2 puffs into the lungs every 6 (  six) hours as needed for wheezing or shortness of breath. 1 Inhaler 2  . AMBULATORY NON FORMULARY MEDICATION Power wheelchair due to not being able to ambulate. 1 Device 0  . AMBULATORY NON FORMULARY MEDICATION Tens unit to use for muscle atrophy, weakness, pain, muscle spasms 1 Device 0  . AMBULATORY NON FORMULARY MEDICATION Pulse ox to monitor SOB and Cough. 1 Device 0  . amitriptyline (ELAVIL) 100 MG tablet Take 1 tablet (100 mg total) by mouth at bedtime. 90 tablet 1  .  amphetamine-dextroamphetamine (ADDERALL XR) 30 MG 24 hr capsule Take 1 capsule (30 mg total) by mouth every morning. 30 capsule 0  . amphetamine-dextroamphetamine (ADDERALL XR) 30 MG 24 hr capsule Take 1 capsule (30 mg total) by mouth every morning. 30 capsule 0  . amphetamine-dextroamphetamine (ADDERALL XR) 30 MG 24 hr capsule Take 1 capsule (30 mg total) by mouth every morning. 30 capsule 0  . augmented betamethasone dipropionate (DIPROLENE-AF) 0.05 % ointment     . bumetanide (BUMEX) 0.5 MG tablet Take 1 tablet (0.5 mg total) by mouth 2 (two) times daily. 180 tablet 1  . buPROPion (WELLBUTRIN XL) 150 MG 24 hr tablet Take 1 tablet (150 mg total) by mouth every morning. 90 tablet 3  . butalbital-acetaminophen-caffeine (FIORICET WITH CODEINE) 50-325-40-30 MG capsule TAKE 1 CAPSULE BY MOUTH EVERY 4 HOURS AS NEEDED FOR HEADACHE. FOLLOW UP VISIT WITH PCP 30 capsule 1  . chlorpheniramine (CHLOR-TRIMETON) 4 MG tablet TAKE 1 TABLET BY MOUTH TWICE DAILY AS NEEDED FOR ALLERGIES 60 tablet 0  . cholecalciferol (VITAMIN D3) 25 MCG (1000 UT) tablet Take 1,000 Units by mouth daily.    . clindamycin (CLEOCIN T) 1 % lotion Apply topically 2 (two) times daily. To bilateral legs. 60 mL 1  . clobetasol (OLUX) 0.05 % topical foam Apply topically 2 (two) times daily. 80 g 5  . clobetasol cream (TEMOVATE) 0.05 % Apply 1 application topically 2 (two) times daily. 60 g 2  . cyanocobalamin 1000 MCG tablet Take 1,000 mcg by mouth daily.    . cyclobenzaprine (FLEXERIL) 10 MG tablet Take 1 tablet (10 mg total) by mouth 3 (three) times daily as needed for muscle spasms. 90 tablet 3  . diazepam (VALIUM) 5 MG tablet Take 1 tab PO 2 hours before procedure or imaging. 2 tablet 0  . diclofenac sodium (VOLTAREN) 1 % GEL APPLY 4 GRAMS EXTERNALLY TO THE AFFECTED AREA FOUR TIMES DAILY 200 g 3  . dicyclomine (BENTYL) 10 MG capsule TAKE 1 CAPSULE(10 MG) BY MOUTH THREE TIMES DAILY BEFORE MEALS 90 capsule 2  .       . fluocinonide cream  (LIDEX) 0.05 % Apply topically 2 (two) times daily. 60 g 2  . fluticasone (FLONASE) 50 MCG/ACT nasal spray One spray in each nostril twice a day, use left hand for right nostril, and right hand for left nostril. 48 g 3  . furosemide (LASIX) 20 MG tablet TAKE 1 TABLET(20 MG) BY MOUTH DAILY 90 tablet 0  . gabapentin (NEURONTIN) 600 MG tablet TAKE 1 TABLET BY MOUTH THREE TIMES DAILY 180 tablet 1  . HYDROcodone-acetaminophen (NORCO/VICODIN) 5-325 MG tablet Take 1-2 tablets as needed at bedtime for moderate to severe pain. 60 tablet 0  . hydrOXYzine (ATARAX/VISTARIL) 50 MG tablet TAKE 1 TABLET BY MOUTH AT BEDTIME. MAY REPEAT 1 TIME AS NEEDED FOR ITCHING 30 tablet 0  . Ixekizumab 80 MG/ML SOAJ Inject into the skin.    [name]. ketoconazole (NIZORAL) 2 % cream Apply 1 application  topically 2 (two) times daily. To affected areas. 60 g 1  . levocetirizine (XYZAL) 5 MG tablet Take 1 tablet (5 mg total) by mouth every evening. 90 tablet 4  . montelukast (SINGULAIR) 10 MG tablet TAKE 1 TABLET BY MOUTH EVERY NIGHT AT BEDTIME 90 tablet 0  . MULTIPLE VITAMIN PO Take by mouth daily.    . mupirocin ointment (BACTROBAN) 2 % Apply to affected area BID for 7 days. 30 g 3  . ondansetron (ZOFRAN-ODT) 8 MG disintegrating tablet DISSOLVE 1 TABLET(8 MG) ON THE TONGUE EVERY 8 HOURS AS NEEDED FOR NAUSEA 30 tablet 1  . RABEprazole (ACIPHEX) 20 MG tablet Take 1 tablet (20 mg total) by mouth 2 (two) times daily. 60 tablet 11  . sucralfate (CARAFATE) 1 g tablet TAKE 1 TABLET(1 GRAM) BY MOUTH FOUR TIMES DAILY 120 tablet 0  . vortioxetine HBr (TRINTELLIX) 20 MG TABS tablet TAKE 1 TABLET(20 MG) BY MOUTH DAILY 90 tablet 3  . zolpidem (AMBIEN) 5 MG tablet TAKE 1 TABLET BY MOUTH EVERY NIGHT AT BEDTIME 30 tablet 5  . Liraglutide -Weight Management (SAXENDA) 18 MG/3ML SOPN Inject 3 mg into the skin daily. 5 pen 2      Objective:     BP 124/78 (BP Location: Left Arm, Cuff Size: Large)   Pulse 80   Temp 97.7 F (36.5 C) (Oral)   Ht 5'  4" (1.626 m)   Wt (!) 384 lb 3.2 oz (174.3 kg)   SpO2 99%   BMI 65.95 kg/m   SpO2: 99 %  RA  W/c bound massively obese wf  Unhealthy appearing with dry cough to point of gagging   HEENT: nl dentition, turbinates bilaterally, and oropharynx. Nl external ear canals without cough reflex   NECK :  without JVD/Nodes/TM/ nl carotid upstrokes bilaterally   LUNGS: no acc muscle use,  Nl contour chest which is clear to A and P bilaterally with cough early  on insp  maneuvers   CV:  RRR  no s3 or murmur or increase in P2, and no edema   ABD:  soft and nontender with nl inspiratory excursion in the supine position. No bruits or organomegaly appreciated, bowel sounds nl  MS:    ext warm without deformities, calf tenderness, cyanosis or clubbing No obvious joint restrictions   SKIN: warm and dry with multiple erythematous mp patches lower ext     NEURO:  alert, approp, nl sensorium with  no motor or cerebellar deficits apparent.     I personally reviewed images and agree with radiology impression as follows:  pCXR:  09/09/2018 No acute appearing airspace opacity. Previously noted heterogeneous opacity of the right lung base appears resolved My impression: the only cxr's read as RLL pna are pCXRs and given body habitus are of very questionable reliability     CT sinus 12/01/17 wnl      Assessment   No problem-specific Assessment & Plan notes found for this encounter.     Sandrea HughsMichael Uriah Trueba, MD 09/30/2018

## 2018-10-01 ENCOUNTER — Encounter: Payer: Self-pay | Admitting: Internal Medicine

## 2018-10-01 ENCOUNTER — Other Ambulatory Visit: Payer: Self-pay | Admitting: Physician Assistant

## 2018-10-01 DIAGNOSIS — R21 Rash and other nonspecific skin eruption: Secondary | ICD-10-CM

## 2018-10-01 NOTE — Assessment & Plan Note (Signed)
Onset cough Dec 2019  - Sinus CT neg 08/25/69 - cyclical cough rx 0/07/2692 with flutter valve demonstration   Absence when sleeping and quality of harsh dry sounding cough early in insp are classic for upper airway cough syndrome (previously labeled PNDS),  is so named because it's frequently impossible to sort out how much is  CR/sinusitis with freq throat clearing (which can be related to primary GERD)   vs  causing  secondary (" extra esophageal")  GERD from wide swings in gastric pressure that occur with throat clearing, often  promoting self use of mint and menthol lozenges that reduce the lower esophageal sphincter tone and exacerbate the problem further in a cyclical fashion.   These are the same pts (now being labeled as having "irritable larynx syndrome" by some cough centers) who not infrequently have a history of having failed to tolerate ace inhibitors,  dry powder inhalers or biphosphonates or report having atypical/extraesophageal reflux symptoms that don't respond to standard doses of PPI  and are easily confused as having aecopd or asthma flares by even experienced allergists/ pulmonologists (myself included).    Of the three most common causes of  Sub-acute / recurrent or chronic cough, only one (GERD)  can actually contribute to/ trigger  the other two (asthma and post nasal drip syndrome)  and perpetuate the cylce of cough.  While not intuitively obvious, many patients with chronic low grade reflux do not cough until there is a primary insult that disturbs the protective epithelial barrier and exposes sensitive nerve endings.   This is typically viral but can due to PNDS and  either may apply here.     >>>  The point is that once this occurs, it is difficult to eliminate the cycle  using anything but a maximally effective acid suppression regimen at least in the short run, accompanied by an appropriate diet to address non acid GERD and control / eliminate the pnds with 1st gen H1  blockers per guidelines  And prevent upper airway trauma from coughing with use of flutter valve     >>> if not better in 2 week rec return with all meds in hand using a trust but verify approach to confirm accurate Medication  Reconciliation The principal here is that until we are certain that the  patients are doing what we've asked, it makes no sense to ask them to do more.    Total time devoted to counseling  > 50 % of initial 60 min office visit:  reviewed case with pt/ husband/  performed device teaching (flutter valve)  using a teach back technique which also  extended face to face time for this visit (see above) discussion of options/alternatives/ personally creating written customized instructions  in presence of pt  then going over those specific  Instructions directly with the pt including how to use all of the meds but in particular covering each new medication in detail and the difference between the maintenance= "automatic" meds and the prns using an action plan format for the latter (If this problem/symptom => do that organization reading Left to right).  Please see AVS from this visit for a full list of these instructions which I personally wrote for this pt and  are unique to this visit.

## 2018-10-02 ENCOUNTER — Encounter: Payer: Self-pay | Admitting: Physician Assistant

## 2018-10-02 DIAGNOSIS — R5382 Chronic fatigue, unspecified: Secondary | ICD-10-CM

## 2018-10-02 DIAGNOSIS — G9332 Myalgic encephalomyelitis/chronic fatigue syndrome: Secondary | ICD-10-CM

## 2018-10-03 MED ORDER — AMPHETAMINE-DEXTROAMPHET ER 30 MG PO CP24: 30.0000 mg | ORAL_CAPSULE | ORAL | 0 refills | Status: DC

## 2018-10-07 ENCOUNTER — Other Ambulatory Visit: Payer: Self-pay | Admitting: Physician Assistant

## 2018-10-07 DIAGNOSIS — G894 Chronic pain syndrome: Secondary | ICD-10-CM

## 2018-10-07 DIAGNOSIS — K2100 Gastro-esophageal reflux disease with esophagitis, without bleeding: Secondary | ICD-10-CM

## 2018-10-14 ENCOUNTER — Other Ambulatory Visit: Payer: Self-pay | Admitting: Physician Assistant

## 2018-10-14 DIAGNOSIS — G43011 Migraine without aura, intractable, with status migrainosus: Secondary | ICD-10-CM

## 2018-10-21 ENCOUNTER — Other Ambulatory Visit: Payer: Self-pay | Admitting: Physician Assistant

## 2018-10-21 DIAGNOSIS — R600 Localized edema: Secondary | ICD-10-CM

## 2018-10-28 ENCOUNTER — Other Ambulatory Visit: Payer: Self-pay | Admitting: Physician Assistant

## 2018-10-28 DIAGNOSIS — G894 Chronic pain syndrome: Secondary | ICD-10-CM

## 2018-10-28 DIAGNOSIS — R05 Cough: Secondary | ICD-10-CM

## 2018-10-28 DIAGNOSIS — G44319 Acute post-traumatic headache, not intractable: Secondary | ICD-10-CM

## 2018-10-28 DIAGNOSIS — R058 Other specified cough: Secondary | ICD-10-CM

## 2018-10-28 DIAGNOSIS — M7918 Myalgia, other site: Secondary | ICD-10-CM

## 2018-10-28 DIAGNOSIS — F339 Major depressive disorder, recurrent, unspecified: Secondary | ICD-10-CM

## 2018-11-03 ENCOUNTER — Telehealth: Payer: Self-pay | Admitting: Internal Medicine

## 2018-11-03 ENCOUNTER — Other Ambulatory Visit: Payer: Self-pay | Admitting: Physician Assistant

## 2018-11-03 DIAGNOSIS — R059 Cough, unspecified: Secondary | ICD-10-CM

## 2018-11-03 DIAGNOSIS — R05 Cough: Secondary | ICD-10-CM

## 2018-11-03 DIAGNOSIS — L281 Prurigo nodularis: Secondary | ICD-10-CM

## 2018-11-03 DIAGNOSIS — K2100 Gastro-esophageal reflux disease with esophagitis, without bleeding: Secondary | ICD-10-CM

## 2018-11-03 NOTE — Telephone Encounter (Signed)
Spoke with pt, advised her that the exclamation point is there when there is an abnormal value. She was very upset that it was by her weight but I reassured her that it was nothing personal. Pt understood and nothing further is needed.

## 2018-11-03 NOTE — Telephone Encounter (Signed)
Last filled 09/09/2018 60 mL with one refill.

## 2018-11-04 ENCOUNTER — Other Ambulatory Visit: Payer: Self-pay

## 2018-11-04 ENCOUNTER — Ambulatory Visit (INDEPENDENT_AMBULATORY_CARE_PROVIDER_SITE_OTHER): Payer: PRIVATE HEALTH INSURANCE | Admitting: Physician Assistant

## 2018-11-04 VITALS — BP 130/90 | HR 93 | Temp 99.3°F

## 2018-11-04 DIAGNOSIS — R058 Other specified cough: Secondary | ICD-10-CM

## 2018-11-04 DIAGNOSIS — R05 Cough: Secondary | ICD-10-CM

## 2018-11-04 DIAGNOSIS — R5382 Chronic fatigue, unspecified: Secondary | ICD-10-CM

## 2018-11-04 DIAGNOSIS — F5101 Primary insomnia: Secondary | ICD-10-CM

## 2018-11-04 DIAGNOSIS — R7989 Other specified abnormal findings of blood chemistry: Secondary | ICD-10-CM

## 2018-11-04 DIAGNOSIS — G9332 Myalgic encephalomyelitis/chronic fatigue syndrome: Secondary | ICD-10-CM

## 2018-11-04 DIAGNOSIS — R682 Dry mouth, unspecified: Secondary | ICD-10-CM

## 2018-11-04 DIAGNOSIS — Z23 Encounter for immunization: Secondary | ICD-10-CM

## 2018-11-04 DIAGNOSIS — M79604 Pain in right leg: Secondary | ICD-10-CM | POA: Diagnosis not present

## 2018-11-04 MED ORDER — ZOLPIDEM TARTRATE 5 MG PO TABS: 5.0000 mg | ORAL_TABLET | Freq: Every day | ORAL | 5 refills | Status: DC

## 2018-11-04 MED ORDER — AMPHETAMINE-DEXTROAMPHET ER 30 MG PO CP24: 30.0000 mg | ORAL_CAPSULE | ORAL | 0 refills | Status: DC

## 2018-11-04 NOTE — Patient Instructions (Addendum)
Biotene for dry mouth   Hamstring Strain Rehab Ask your health care provider which exercises are safe for you. Do exercises exactly as told by your health care provider and adjust them as directed. It is normal to feel mild stretching, pulling, tightness, or discomfort as you do these exercises. Stop right away if you feel sudden pain or your pain gets worse. Do not begin these exercises until told by your health care provider. Stretching and range-of-motion exercises These exercises warm up your muscles and joints and improve the movement and flexibility of your thighs. These exercises also help to relieve pain, numbness, and tingling. Talk to your health care provider about these restrictions. Knee extension, seated  1. Sit with your left / right heel propped on a chair, a coffee table, or a footstool. Do not have anything under your knee to support it. 2. Allow your leg muscles to relax, letting gravity straighten out your knee (extension). You should feel a stretch behind your left / right knee. 3. If told by your health care provider, deepen the stretch by placing a __________ weight on your thigh, just above your kneecap. 4. Hold this position for __________ seconds. Repeat __________ times. Complete this exercise __________ times a day. Seated stretch This exercise is sometimes called hamstrings and adductors stretch. 1. Sit on the floor with your legs stretched wide. Keep your knees straight during this exercise. 2. Keeping your head and back in a straight line, bend at your waist to reach for your left foot (position A). You should feel a stretch in your right inner thigh (adductors). 3. Hold this position for __________ seconds. Then slowly return to the upright position. 4. Keeping your head and back in a straight line, bend at your waist to reach forward (position B). You should feel a stretch behind both of your thighs or knees (hamstrings). 5. Hold this position for __________  seconds. Then slowly return to the upright position. 6. Keeping your head and back in a straight line, bend at your waist to reach for your right foot (position C). You should feel a stretch in your left inner thigh (adductors). 7. Hold this position for __________ seconds. Then slowly return to the upright position. Repeat __________ times. Complete this exercise __________ times a day. Hamstrings stretch, supine  1. Lie on your back (supine position). 2. Loop a belt or towel over the ball of your left / right foot. The ball of your foot is on the walking surface, right under your toes. 3. Straighten your left / right knee and slowly pull on the belt or towel to raise your leg. ? Do not let your left / right knee bend while you do this. ? Keep your other leg flat on the floor. ? Raise the left / right leg until you feel a gentle stretch behind your left / right knee or thigh (hamstrings). 4. Hold this position for __________ seconds. 5. Slowly return your leg to the starting position. Repeat __________ times. Complete this exercise __________ times a day. Strengthening exercises These exercises build strength and endurance in your thighs. Endurance is the ability to use your muscles for a long time, even after they get tired. Straight leg raises, prone This exercise strengthens the muscles that move the hips (hip extensors). 1. Lie on your abdomen on a firm surface (prone position). 2. Tense the muscles in your buttocks and lift your left / right leg about 4 inches (10 cm). Keep your knee straight as  you lift your leg. If you cannot lift your leg that high without arching your back, place a pillow under your hips. 3. Hold the position for __________ seconds. 4. Slowly lower your leg to the starting position. 5. Allow your muscles to relax completely before you start the next repetition. Repeat __________ times. Complete this exercise __________ times a day. Bridge This exercise strengthens  the muscles in your buttocks and the back of your thighs (hip extensors). 1. Lie on your back on a firm surface with your knees bent and your feet flat on the floor. 2. Tighten your buttocks muscles and lift your bottom off the floor until the trunk of your body is level with your thighs. ? You should feel the muscles working in your buttocks and the back of your thighs. ? Do not arch your back. 3. Hold this position for __________ seconds. 4. Slowly lower your hips to the starting position. 5. Let your buttocks muscles relax completely between repetitions. 6. If told by your health care provider, keep your bottom lifted off the floor while you slowly walk your feet away from you as far as you can control. Hold for __________ seconds, then slowly walk your feet back toward you. Repeat __________ times. Complete this exercise __________ times a day. Lateral walking with band This is an exercise in which you walk sideways (lateral), with tension provided by an exercise band. The exercise strengthens the muscles in your hip (hip abductors). 1. Stand in a long hallway. 2. Wrap a loop of exercise band around your legs, just above your knees. 3. Bend your knees gently and drop your hips down and back so your weight is over your heels. 4. Step to the side to move down the length of the hallway, keeping your toes pointed ahead of you and keeping tension in the band. 5. Repeat, leading with your other leg. Repeat __________ times. Complete this exercise __________ times a day. Single leg stand with reaching This exercise is also called eccentric hamstring stretch. 1. Stand on your left / right foot. Keep your big toe down on the floor and try to keep your arch lifted. 2. Slowly reach down toward the floor as far as you can while keeping your balance. Lowering your thigh under tension is called eccentric stretching. 3. Hold this position for __________ seconds. Repeat __________ times. Complete this  exercise __________ times a day. Plank, prone This exercise strengthens muscles in your abdomen and core area. 1. Lie on your abdomen on the floor (prone position),and prop yourself up on your elbows. Your hands should be straight out in front of you, and your elbows should be below your shoulders. Position your feet similar to a push-up position so your toes are on the ground. 2. Tighten your abdominal muscles and lift your body off the floor. ? Do not arch your back. ? Do not hold your breath. 3. Hold this position for __________ seconds. Repeat __________ times. Complete this exercise __________ times a day. This information is not intended to replace advice given to you by your health care provider. Make sure you discuss any questions you have with your health care provider. Document Released: 02/09/2005 Document Revised: 06/02/2018 Document Reviewed: 02/07/2018 Elsevier Patient Education  2020 [employer].

## 2018-11-04 NOTE — Progress Notes (Signed)
Subjective:    Patient ID: Sarah Mayer, female    DOB: 1970/06/24, 48 y.o.   MRN: [id number]  HPI  Pt is a 48 yo morbidly obese female with migraines, OSA, Barretts esophagus, hypothyroidism, psoratic arthritis, CFS, MDD,insomnia and newly diagnosed upper airwary cough syndrome.    Upper airway cough syndrome after being seen by pulmonology. Pt is doing better on aciphex and pepcid. She was told to stop chewing on mints.  She does complain of some posterior right leg pain only when in a certain position laying down. It feels like a pulling sensation. No known injury. No swelling or bruising. Not tried anything to make better. No sOB, tenderness to touch.   She needs refills for adderall. It does help a lot with her energy level.   Rheumatology will have her continue on [place] for another 3 months.   Last creatine was 1.06. she is not taking any NSAIDS.  Wonders about dry mouth and anything she can do for it. Denies trying anything.   Pt requested another referral to bariatric. She has met her deductible and wants to see if she could get approved.   Pt has not seen neurology yet but has appt for October.    .. Active Ambulatory Problems    Diagnosis Date Noted  . Psoriatic arthritis ([place]) 03/24/2016  . Gastroesophageal reflux disease 03/24/2016  . GAD (generalized anxiety disorder) 03/24/2016  . Iron deficiency anemia 03/24/2016  . History of pulmonary embolism 03/27/2016  . History of DVT (deep vein thrombosis) 03/27/2016  . Chronic tension-type headache, not intractable 03/27/2016  . Primary insomnia 03/27/2016  . Bilateral lower extremity edema 03/27/2016  . Chronic pain syndrome 05/25/2016  . Morbid (severe) obesity due to excess calories ([place]) 05/27/2016  . Serum calcium elevated 08/30/2016  . Diarrhea 08/30/2016  . Collagenous colitis 10/18/2016  . Barrett's esophagus without dysplasia 10/18/2016  . Arthralgia 10/18/2016  . Memory changes 11/23/2016  . Major  depression, recurrent, chronic ([place]) 11/23/2016  . OSA (obstructive sleep apnea) 11/23/2016  . Twitching 12/22/2016  . Upper airway cough syndrome 12/22/2016  . Bilateral hip pain 12/22/2016  . Tripping over things 01/12/2017  . Left arm pain 01/12/2017  . Left elbow pain 01/12/2017  . Numbness and tingling in left arm 01/17/2017  . Word finding difficulty 03/15/2017  . Intractable migraine without aura and with status migrainosus 03/15/2017  . Edema of both feet 07/04/2017  . B12 deficiency 07/04/2017  . Acquired hypothyroidism 07/04/2017  . Vitamin D deficiency 07/04/2017  . Secondary hyperparathyroidism, non-renal ([place]) 07/04/2017  . Psoriasis 07/06/2017  . CFS (chronic fatigue syndrome) 07/06/2017  . Easy bruising 07/26/2017  . Myofascial pain 08/19/2017  . Itching 08/19/2017  . Yeast infection 08/19/2017  . IBS (irritable bowel syndrome) 12/03/2017  . Recurrent falls 12/29/2017  . Weakness 02/11/2018  . 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 Urinary frequency 02/14/2018  . Chronic left shoulder pain 03/15/2018  . Chronic pain of left thumb 03/15/2018  . 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 Jerking 09/12/2018  . Episode of confusion 09/12/2018  . Right leg pain 11/14/2018  . Elevated serum creatinine 11/14/2018  . Dry mouth 11/14/2018   Resolved Ambulatory Problems    Diagnosis Date Noted  . Hyperparathyroidism, primary ([place]) 09/18/2016  . Rash and nonspecific skin eruption 10/18/2016  . Acute bronchitis 02/07/2018   Past Medical History:  Diagnosis Date  . Microscopic colitis   . Migraine   . Morbid obesity ([place]) 05/27/2016     Review  of Systems See HPI.     Objective:   Physical Exam Vitals signs reviewed.  Constitutional:      Appearance: Normal appearance. She is obese.  HENT:     Head: Normocephalic.  Cardiovascular:     Rate and Rhythm: Normal rate and regular rhythm.  Pulmonary:     Effort: Pulmonary effort is normal.     Breath sounds: Normal breath sounds.   Musculoskeletal:     Comments: Normal strength bilateral legs.  No swelling.  No tenderness or mass behind right knee.  Some tenderness where hamstring inserts to the knee.   Neurological:     General: No focal deficit present.     Mental Status: She is alert and oriented to person, place, and time.  Psychiatric:        Mood and Affect: Mood normal.        Behavior: Behavior normal.           Assessment & Plan:  Marland KitchenMarland KitchenEverlyn was seen today for follow-up.  Diagnoses and all orders for this visit:  Right leg pain  Flu vaccine need -     Flu Vaccine QUAD 36+ mos IM  CFS (chronic fatigue syndrome) -     Discontinue: amphetamine-dextroamphetamine (ADDERALL XR) 30 MG 24 hr capsule; Take 1 capsule (30 mg total) by mouth every morning. -     Discontinue: amphetamine-dextroamphetamine (ADDERALL XR) 30 MG 24 hr capsule; Take 1 capsule (30 mg total) by mouth every morning. -     Discontinue: amphetamine-dextroamphetamine (ADDERALL XR) 30 MG 24 hr capsule; Take 1 capsule (30 mg total) by mouth every morning. -     amphetamine-dextroamphetamine (ADDERALL XR) 30 MG 24 hr capsule; Take 1 capsule (30 mg total) by mouth every morning. -     amphetamine-dextroamphetamine (ADDERALL XR) 30 MG 24 hr capsule; Take 1 capsule (30 mg total) by mouth every morning. -     amphetamine-dextroamphetamine (ADDERALL XR) 30 MG 24 hr capsule; Take 1 capsule (30 mg total) by mouth every morning.  Primary insomnia -     zolpidem (AMBIEN) 5 MG tablet; Take 1 tablet (5 mg total) by mouth at bedtime.  Elevated serum creatinine -     COMPLETE METABOLIC PANEL WITH GFR  Morbid (severe) obesity due to excess calories ([facility]) -     Amb Referral to Bariatric Surgery  Upper airway cough syndrome  Dry mouth   Refilled adderall for 3 months.   Suspect a hamstring strain for posterior right leg pain. Discussed heat/ice/stretching/diclofenac cream. Follow up as needed. If worsening could consider venous doppler to  rule out DVT. Pt is non ambulatory. No recent surgeries. No SOB. No travel. Not on hormone therapy. No bruising/redness/swelling.   Continue to follow pulmonology for upper airway cough syndrome.   Continue to follow rheumatology for psoriatic arthritis.   Consider work up for [place] for dry mouth but at this time not c/o dry eye etc. Try biotene OTC. Likely dry mouth is a side effect of some of her medications.   Will recheck elevated serum creating.   Made bariatric referral.   Follow up in 3 months.

## 2018-11-06 ENCOUNTER — Encounter: Payer: Self-pay | Admitting: Physician Assistant

## 2018-11-09 ENCOUNTER — Encounter: Payer: Self-pay | Admitting: Physician Assistant

## 2018-11-09 MED ORDER — AMPHETAMINE-DEXTROAMPHET ER 30 MG PO CP24: 30.0000 mg | ORAL_CAPSULE | ORAL | 0 refills | Status: DC

## 2018-11-14 ENCOUNTER — Encounter: Payer: Self-pay | Admitting: Physician Assistant

## 2018-11-14 DIAGNOSIS — M79604 Pain in right leg: Secondary | ICD-10-CM | POA: Insufficient documentation

## 2018-11-14 DIAGNOSIS — R682 Dry mouth, unspecified: Secondary | ICD-10-CM | POA: Insufficient documentation

## 2018-11-14 DIAGNOSIS — R7989 Other specified abnormal findings of blood chemistry: Secondary | ICD-10-CM | POA: Insufficient documentation

## 2018-11-14 MED ORDER — PAROXETINE HCL 10 MG PO TABS
ORAL_TABLET | ORAL | 1 refills | 30.00000 days | Status: AC
Start: 2018-11-14 — End: ?

## 2018-11-15 ENCOUNTER — Ambulatory Visit: Payer: Commercial Managed Care - PPO

## 2018-11-15 ENCOUNTER — Ambulatory Visit: Payer: PRIVATE HEALTH INSURANCE

## 2018-11-15 MED ORDER — PAROXETINE HCL 10 MG PO TABS
ORAL_TABLET | ORAL | 1 refills | 30.00000 days | Status: AC
Start: 2018-11-15 — End: ?

## 2018-11-15 NOTE — Progress Notes
Patient: Vanessa Terry  DOB: [birth year]  MRN: [id number]  Date: 11/15/2018       Chief Complaint:   Chief Complaint   Patient presents with   ? Follow-up     for depression        Subjective   Pt is a 48 yo female that is here for depression.    Pt has depression and doing ok. Pt on paxil for one year. Pt has no SI or HI.  Pt is not seeing therapist.   Pt was seeing a psychiatrist but not recently.  Last seen was two years ago.  Pt has been stable.      Pt has had some left side ''rib pain''. Pt took advil. Pt has no SOB.  Pt states only when lays on her left side.  Mom and dad with significant heart disease history.  Both with A fib and one with AS.     Pt had plantar fasciitis on left. Pt changed shoes to help herself. Feels it is working     Pt has incontinence for six months. Pt states she cannot always make it the bathroom and has lost urine.  Pt runs to the bathroom and still has accident.  Pt has urine loss with cough or sneeze or bears down.  Pt had manual dilation with her twins delivery and then had c section.  Pt had to see urologist after their delivery.  Pt had episode of incontinence on the way here.     PMHX, PSHX, Meds, Allergies, Soc Hx, and etc were all reviewed today.     No additional contributory past medical, surgical, family or social history except as documented in care connect.   Complete 14 pointReview of Systems - ROS negative except as documented above     Past Medical History:   Diagnosis Date   ? Anemia    ? Anxiety    ? Coccyx pain    ? Constipation    ? Depression    ? IBS (irritable bowel syndrome)    ? Incontinence in female    ? Tinnitus of left ear    ? Vertigo      Patient Active Problem List   Diagnosis   ? Tinnitus of left ear   ? Vertigo   ? Coccyx pain   ? Toe pain, left   ? History of in vitro fertilization   ? Abscess   ? Hyperlipidemia, unspecified   ? Mood changes   ? Family history of colorectal cancer   ? Family history of colon cancer requiring screening colonoscopy ? Depression   ? Incontinence in female     Past Surgical History:   Procedure Laterality Date   ? CESAREAN SECTION  2014   ? KNEE SURGERY  1994    in [place], arthroscopic   ? LAPAROSCOPY DIAGNOSTIC / BIOPSY / ASPIRATION / LYSIS  2011   ? MYOMECTOMY  2012    [facility]   ? OVARY SURGERY  2011   ? REMOVAL OF R OVARIAN CYST  2011    [facility]          Meds:  Outpatient Medications Prior to Visit   Medication Sig   ? PAROXETINE 10 mg tablet TAKE 1 TABLET BY MOUTH EVERY DAY     No facility-administered medications prior to visit.        Allergies   Allergen Reactions   ? Sulfamethoxazole-Trimethoprim      Eye swelling  Family History   Problem Relation Age of Onset   ? Aortic stenosis Mother         s/p AV valve replacement   ? Atrial fibrillation Mother         deceased from cardiosynch procedure   ? Breast cancer Mother 23   ? Alcohol abuse Mother    ? Arthritis Mother    ? Depression Mother    ? Hyperlipidemia Mother    ? Hypertension Mother    ? Stroke Mother    ? Other (myocardial infarction) Mother    ? Aortic stenosis Cousin         s/p AV valve replacement   ? Meniere's disease Other         niece   ? Colon cancer Maternal Uncle 45   ? Rectal cancer Sister 32   ? Depression Sister    ? Alcohol abuse Sister    ? Arthritis Sister    ? Atrial fibrillation Father    ? Heart failure Father    ? Melanoma Father    ? Alcohol abuse Father    ? Arthritis Father    ? Diabetes type II Father    ? Hyperlipidemia Father    ? Hypertension Father    ? Kidney disease Father    ? Learning disabilities Father    ? Stroke Father    ? Other (myocardial infarction) Father    ? Hypertension Brother    ? Learning disabilities Brother    ? Alcohol abuse Brother    ? Arthritis Brother    ? Arthritis Paternal Grandmother    ? Alcohol abuse Paternal Grandfather    ? Arthritis Paternal Grandfather    ? Alcohol abuse Sister    ? Arthritis Sister    ? Diabetes type II Brother    ? Arthritis Brother ? Other (congenital cataract) Daughter    ? Club foot Niece    ? Other (aortic valve replaced) Maternal Cousin             Objective   Review of Systems   ROS Normal unless checked below:    Check if Abnormal  Note Positive Findings    []  Constit      []  Eyes      []  ENT      []  Thyroid     []  Resp      []  Imm/All      [x]  CV      []  GI      [x]  GU      []  Endo      []  MS      []  Skin      []  Heme/Lymph      []  Neuro      [x]  Psych     []  Other       Vitals: BP 113/78  ~ Pulse 60  ~ Temp 37 ?C (98.6 ?F) (Tympanic)  ~ Ht 5' 3'' (1.6 m)  ~ Wt 145 lb (65.8 kg)  ~ LMP 10/29/2018  ~ SpO2 98%  ~ BMI 25.69 kg/m?   Gen: NAD  Lungs: CTA B/L No R/R/W  Heart: S1S2 RRR, no murmurs  Abdomen: soft, NTND, no masses, BS present, No R/R/G  Ext: no edema, no lesions  Skin: Clear of significant lesions  Neuro: Alert and Oriented X 3.     EKG :   52 HR. NSR. PR interval borderline short  No ST seg or T wave changes.  As interpreted by myself    PH-9 TOTAL SCORE [place] 12/04/2014 04/22/2018 11/15/2018   Total Score 12 4 3    Some encounter information is confidential and restricted. Go to Review Flowsheets activity to see all data.       Labs/Studies:  Lab Results   Component Value Date    NA 141 08/18/2018    K 4.7 08/18/2018    CL 105 08/18/2018    CO2 21 08/18/2018    BUN 15 08/18/2018    CALCIUM 9.6 08/18/2018    ALKPHOS 42 08/18/2018    AST 23 08/18/2018    ALT 13 08/18/2018    BILITOT 1.1 08/18/2018    GLUCOSE 80 08/18/2018     Lab Results   Component Value Date    WBC 6.29 04/29/2018    HGB 13.1 04/29/2018    HCT 41.2 04/29/2018    PLT 333 04/29/2018    RBC 4.30 04/29/2018    MCV 95.8 04/29/2018         There is no immunization history on file for this patient.    Assessment /Plan     Diagnoses and all orders for this visit:    Depression, unspecified depression type  -     TBOC - PHQ-9 (Monitor) Assessment  -     PARoxetine 10 mg tablet; TAKE 1 TABLET BY MOUTH EVERY DAY.    Urinary incontinence, unspecified type -     Referral to Obstetrics & Gynecology    Chest pain, atypical  -     ECG 12-Lead [facility] Performed  -     Referral to Cardiology, Adult General      RTC in 6 months for depression      Quaniyah Bugh E. Milanes-Skopp, DO         The above plan of care, diagnosis, orders, and follow-up were discussed with the patient.  Questions related to this recommended plan of care were answered.

## 2018-11-21 ENCOUNTER — Encounter: Payer: Self-pay | Admitting: Physician Assistant

## 2018-11-24 ENCOUNTER — Other Ambulatory Visit: Payer: Self-pay | Admitting: Family Medicine

## 2018-11-24 DIAGNOSIS — R21 Rash and other nonspecific skin eruption: Secondary | ICD-10-CM

## 2018-11-25 ENCOUNTER — Ambulatory Visit: Payer: BLUE CROSS/BLUE SHIELD

## 2018-11-25 DIAGNOSIS — Z8249 Family history of ischemic heart disease and other diseases of the circulatory system: Secondary | ICD-10-CM

## 2018-11-25 DIAGNOSIS — R0789 Other chest pain: Secondary | ICD-10-CM

## 2018-11-25 DIAGNOSIS — R0600 Dyspnea, unspecified: Secondary | ICD-10-CM

## 2018-11-25 DIAGNOSIS — E785 Hyperlipidemia, unspecified: Secondary | ICD-10-CM

## 2018-11-25 DIAGNOSIS — R001 Bradycardia, unspecified: Secondary | ICD-10-CM
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
## 2018-11-25 NOTE — Patient Instructions
Schedule echocardiogram     Try ibuprofen 600 mg twice daily with meals for 5 days     Please exercise more    Decrease egg yolks, whole diary products    No butter

## 2018-12-06 ENCOUNTER — Other Ambulatory Visit: Payer: Self-pay | Admitting: Physician Assistant

## 2018-12-06 DIAGNOSIS — R053 Chronic cough: Secondary | ICD-10-CM

## 2018-12-06 DIAGNOSIS — R05 Cough: Secondary | ICD-10-CM

## 2018-12-06 DIAGNOSIS — K580 Irritable bowel syndrome with diarrhea: Secondary | ICD-10-CM

## 2018-12-12 ENCOUNTER — Other Ambulatory Visit: Payer: Self-pay | Admitting: Physician Assistant

## 2018-12-12 ENCOUNTER — Ambulatory Visit: Payer: PRIVATE HEALTH INSURANCE | Admitting: Physician Assistant

## 2018-12-14 ENCOUNTER — Encounter: Payer: Self-pay | Admitting: Physician Assistant

## 2018-12-15 ENCOUNTER — Encounter: Payer: Self-pay | Admitting: Physician Assistant

## 2018-12-15 DIAGNOSIS — E210 Primary hyperparathyroidism: Secondary | ICD-10-CM

## 2018-12-16 ENCOUNTER — Telehealth: Payer: Self-pay | Admitting: Physician Assistant

## 2018-12-16 NOTE — Telephone Encounter (Signed)
Referral entered.   Cindy - FYI.

## 2018-12-16 NOTE — Telephone Encounter (Signed)
Patient made aware of recommendations.   

## 2018-12-16 NOTE — Telephone Encounter (Signed)
I would self isolate from husband until a 7 day window. No testing needed unless she has direct contact with someone with covid. If they are using the same tools without washing or wearing gloves I would advise her husband be tested.

## 2018-12-16 NOTE — Telephone Encounter (Signed)
Patient calling in stating that she has an auto immune disease and her husbands coworker tested positive for covid. They work in two separate buildings but they go through the same doors and use the same tools. She didn't know if she had to self quarantine or get tested or what to do. Please advise.

## 2018-12-16 NOTE — Telephone Encounter (Signed)
[facility] for referral for endocrinology in [place].

## 2018-12-19 NOTE — Telephone Encounter (Signed)
Sent to [facility] [place]

## 2018-12-22 ENCOUNTER — Encounter: Payer: Self-pay | Admitting: Physician Assistant

## 2018-12-23 ENCOUNTER — Ambulatory Visit: Payer: BLUE CROSS/BLUE SHIELD

## 2018-12-23 ENCOUNTER — Telehealth: Payer: BLUE CROSS/BLUE SHIELD

## 2018-12-23 DIAGNOSIS — Z8249 Family history of ischemic heart disease and other diseases of the circulatory system: Secondary | ICD-10-CM

## 2018-12-23 DIAGNOSIS — E785 Hyperlipidemia, unspecified: Secondary | ICD-10-CM

## 2018-12-23 DIAGNOSIS — R0600 Dyspnea, unspecified: Secondary | ICD-10-CM

## 2018-12-23 DIAGNOSIS — R0789 Other chest pain: Secondary | ICD-10-CM

## 2018-12-23 NOTE — Patient Instructions
Fasting blood work prior to follow up    If your rib cage pain continues after 2-4weeks, follow up with your primary physician for consideration of xray of your ribs.
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
## 2018-12-23 NOTE — Telephone Encounter (Signed)
He is not a patient so he would need to go to Urgent Care.

## 2018-12-26 ENCOUNTER — Other Ambulatory Visit: Payer: Self-pay | Admitting: Physician Assistant

## 2018-12-26 DIAGNOSIS — R5382 Chronic fatigue, unspecified: Secondary | ICD-10-CM

## 2018-12-26 DIAGNOSIS — G9332 Myalgic encephalomyelitis/chronic fatigue syndrome: Secondary | ICD-10-CM

## 2018-12-26 MED ORDER — BUPROPION HCL ER (XL) 150 MG PO TB24: 150.0000 mg | ORAL_TABLET | ORAL | 0 refills | Status: DC

## 2019-01-02 ENCOUNTER — Ambulatory Visit: Payer: PRIVATE HEALTH INSURANCE | Admitting: Endocrinology

## 2019-01-03 ENCOUNTER — Other Ambulatory Visit: Payer: Self-pay | Admitting: Physician Assistant

## 2019-01-03 DIAGNOSIS — G43011 Migraine without aura, intractable, with status migrainosus: Secondary | ICD-10-CM

## 2019-01-05 ENCOUNTER — Ambulatory Visit: Payer: Commercial Managed Care - PPO

## 2019-01-05 ENCOUNTER — Other Ambulatory Visit: Payer: Self-pay | Admitting: Neurology

## 2019-01-05 DIAGNOSIS — J209 Acute bronchitis, unspecified: Secondary | ICD-10-CM

## 2019-01-05 MED ORDER — FLUTICASONE PROPIONATE 50 MCG/ACT NA SUSP: NASAL | 3 refills | Status: DC

## 2019-01-05 MED ORDER — DIAZEPAM 5 MG PO TABS: ORAL_TABLET | ORAL | 0 refills | Status: DC

## 2019-01-05 NOTE — Addendum Note (Signed)
Addended by: Donella Stade on: 01/05/2019 02:46 PM   Modules accepted: Orders

## 2019-01-10 ENCOUNTER — Inpatient Hospital Stay: Payer: Commercial Managed Care - PPO

## 2019-01-10 ENCOUNTER — Other Ambulatory Visit: Payer: Self-pay | Admitting: Physician Assistant

## 2019-01-10 DIAGNOSIS — N6012 Diffuse cystic mastopathy of left breast: Secondary | ICD-10-CM

## 2019-01-10 DIAGNOSIS — Z789 Other specified health status: Secondary | ICD-10-CM

## 2019-01-10 DIAGNOSIS — N6011 Diffuse cystic mastopathy of right breast: Secondary | ICD-10-CM

## 2019-01-13 ENCOUNTER — Ambulatory Visit: Payer: BLUE CROSS/BLUE SHIELD

## 2019-01-13 ENCOUNTER — Encounter: Payer: Self-pay | Admitting: Physician Assistant

## 2019-01-13 ENCOUNTER — Other Ambulatory Visit: Payer: Self-pay

## 2019-01-13 ENCOUNTER — Ambulatory Visit (INDEPENDENT_AMBULATORY_CARE_PROVIDER_SITE_OTHER): Payer: PRIVATE HEALTH INSURANCE | Admitting: Physician Assistant

## 2019-01-13 VITALS — BP 125/83 | HR 83 | Ht 64.0 in | Wt 384.0 lb

## 2019-01-13 DIAGNOSIS — R29898 Other symptoms and signs involving the musculoskeletal system: Secondary | ICD-10-CM

## 2019-01-13 DIAGNOSIS — F5105 Insomnia due to other mental disorder: Secondary | ICD-10-CM

## 2019-01-13 DIAGNOSIS — G43009 Migraine without aura, not intractable, without status migrainosus: Secondary | ICD-10-CM

## 2019-01-13 DIAGNOSIS — M7918 Myalgia, other site: Secondary | ICD-10-CM | POA: Diagnosis not present

## 2019-01-13 DIAGNOSIS — R5382 Chronic fatigue, unspecified: Secondary | ICD-10-CM

## 2019-01-13 DIAGNOSIS — R21 Rash and other nonspecific skin eruption: Secondary | ICD-10-CM

## 2019-01-13 DIAGNOSIS — F99 Mental disorder, not otherwise specified: Secondary | ICD-10-CM

## 2019-01-13 DIAGNOSIS — L089 Local infection of the skin and subcutaneous tissue, unspecified: Secondary | ICD-10-CM | POA: Diagnosis not present

## 2019-01-13 DIAGNOSIS — F339 Major depressive disorder, recurrent, unspecified: Secondary | ICD-10-CM | POA: Diagnosis not present

## 2019-01-13 DIAGNOSIS — G9332 Myalgic encephalomyelitis/chronic fatigue syndrome: Secondary | ICD-10-CM

## 2019-01-13 DIAGNOSIS — L988 Other specified disorders of the skin and subcutaneous tissue: Secondary | ICD-10-CM

## 2019-01-13 MED ORDER — AMPHETAMINE-DEXTROAMPHET ER 30 MG PO CP24: 30.0000 mg | ORAL_CAPSULE | ORAL | 0 refills | Status: DC

## 2019-01-13 MED ORDER — KETOCONAZOLE 2 % EX CREA: 1.0000 "application " | TOPICAL_CREAM | Freq: Two times a day (BID) | CUTANEOUS | 1 refills | Status: DC

## 2019-01-13 MED ORDER — MIRTAZAPINE 15 MG PO TABS: 15.0000 mg | ORAL_TABLET | Freq: Every day | ORAL | 3 refills | Status: DC

## 2019-01-13 MED ORDER — NURTEC 75 MG PO TBDP: 1.0000 | ORAL_TABLET | ORAL | 5 refills | Status: DC | PRN

## 2019-01-13 MED ORDER — DOXYCYCLINE HYCLATE 100 MG PO TABS: 100.0000 mg | ORAL_TABLET | Freq: Two times a day (BID) | ORAL | 0 refills | Status: DC

## 2019-01-13 NOTE — Progress Notes (Signed)
Subjective:    Patient ID: Sarah Mayer, female    DOB: 1970-12-18, 48 y.o.   MRN: [id number]  HPI  Pt is a 48 yo obese female with Psoriatic arthritis, OSA, hypothyroidism, migraines, CFS, CPS, insomnia, MDD who presents to the clinic for follow up. She has a list of concerns today.   She has a scaly rash on her hand that is reoccurring and itchy. Denies any pain, swelling or redness. Does go away at times. Does not seem to spread.   She has a painful area in the crease of her buttocks for the last few weeks. It hurts to sit. Not done anything to make better. Cannot see it.   She continues to have auditory and visual hallucinations. They are always pleasant. She will see bugs bunny or charlie brown in the shower current or see a cat flash by. She is never scared. Neurologist wanted [place] to try coming off some medications. We started with wellbutrin. No change in hallucinations but noticed it with mood. Same with amitriptyline. No change in hallucinations but changes sleep and mood. She feels like she is crying more.   She is worried about her overall legs strength and mobility. She feels like it is worsening. She is not going to PT due to covid.   Her migraines seem to be getting worse. She is having a lot of neck tension. She request trigger point injections.   She is worried about infection in some of her nodules on her leg that she has picked and they have opened up. They are getting really red and tender.  .. Active Ambulatory Problems    Diagnosis Date Noted  . Psoriatic arthritis (HCC) 03/24/2016  . Gastroesophageal reflux disease 03/24/2016  . GAD (generalized anxiety disorder) 03/24/2016  . Iron deficiency anemia 03/24/2016  . History of pulmonary embolism 03/27/2016  . History of DVT (deep vein thrombosis) 03/27/2016  . Chronic tension-type headache, not intractable 03/27/2016  . Primary insomnia 03/27/2016  . Bilateral lower extremity edema 03/27/2016  . Chronic pain  syndrome 05/25/2016  . Morbid (severe) obesity due to excess calories (HCC) 05/27/2016  . Serum calcium elevated 08/30/2016  . Diarrhea 08/30/2016  . Collagenous colitis 10/18/2016  . Barrett's esophagus without dysplasia 10/18/2016  . Arthralgia 10/18/2016  . Memory changes 11/23/2016  . Major depression, recurrent, chronic (HCC) 11/23/2016  . OSA (obstructive sleep apnea) 11/23/2016  . Twitching 12/22/2016  . Upper airway cough syndrome 12/22/2016  . Bilateral hip pain 12/22/2016  . Tripping over things 01/12/2017  . Left arm pain 01/12/2017  . Left elbow pain 01/12/2017  . Numbness and tingling in left arm 01/17/2017  . Word finding difficulty 03/15/2017  . Intractable migraine without aura and with status migrainosus 03/15/2017  . Edema of both feet 07/04/2017  . B12 deficiency 07/04/2017  . Acquired hypothyroidism 07/04/2017  . Vitamin D deficiency 07/04/2017  . Secondary hyperparathyroidism, non-renal (HCC) 07/04/2017  . Psoriasis 07/06/2017  . CFS (chronic fatigue syndrome) 07/06/2017  . Easy bruising 07/26/2017  . Myofascial pain 08/19/2017  . Itching 08/19/2017  . Yeast infection 08/19/2017  . IBS (irritable bowel syndrome) 12/03/2017  . Recurrent falls 12/29/2017  . Weakness 02/11/2018  . 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 Urinary frequency 02/14/2018  . Chronic left shoulder pain 03/15/2018  . Chronic pain of left thumb 03/15/2018  . 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 Jerking 09/12/2018  . Episode of confusion 09/12/2018  . Right leg pain 11/14/2018  . Elevated serum creatinine 11/14/2018  .  Dry mouth 11/14/2018  . Bilateral leg weakness 01/23/2019  . Skin infection 01/23/2019   Resolved Ambulatory Problems    Diagnosis Date Noted  . Hyperparathyroidism, primary ([place]) 09/18/2016  . Rash and nonspecific skin eruption 10/18/2016  . Acute bronchitis 02/07/2018   Past Medical History:  Diagnosis Date  . Microscopic colitis   . Migraine   . Morbid obesity ([place])  05/27/2016      Review of Systems See HPI.     Objective:   Physical Exam Vitals signs reviewed.  Constitutional:      Appearance: She is obese.  HENT:     Head: Normocephalic.  Cardiovascular:     Rate and Rhythm: Normal rate and regular rhythm.  Pulmonary:     Effort: Pulmonary effort is normal.  Genitourinary:    Comments: Macerated open tear in upper gluteal crease.  Skin:    Comments: Erythematous Nodules and scabbed over sores all over bilateral legs.   Scaly rash in center of right palm.    Neurological:     General: No focal deficit present.     Mental Status: She is alert and oriented to person, place, and time.  Psychiatric:        Mood and Affect: Mood normal.           Assessment & Plan:  Marland KitchenMarland KitchenShavone was seen today for pain.  Diagnoses and all orders for this visit:  Rash -     Fungal Stain -     UNLABELED -     PAT ID TIQ DOC  Skin infection -     ketoconazole (NIZORAL) 2 % cream; Apply 1 application topically 2 (two) times daily. To affected areas. -     doxycycline (VIBRA-TABS) 100 MG tablet; Take 1 tablet (100 mg total) by mouth 2 (two) times daily. For 10 days.  CFS (chronic fatigue syndrome) -     amphetamine-dextroamphetamine (ADDERALL XR) 30 MG 24 hr capsule; Take 1 capsule (30 mg total) by mouth every morning. -     amphetamine-dextroamphetamine (ADDERALL XR) 30 MG 24 hr capsule; Take 1 capsule (30 mg total) by mouth every morning. -     amphetamine-dextroamphetamine (ADDERALL XR) 30 MG 24 hr capsule; Take 1 capsule (30 mg total) by mouth every morning.  Major depression, recurrent, chronic (HCC) -     mirtazapine (REMERON) 15 MG tablet; Take 1 tablet (15 mg total) by mouth at bedtime.  Bilateral leg weakness  Insomnia due to other mental disorder -     mirtazapine (REMERON) 15 MG tablet; Take 1 tablet (15 mg total) by mouth at bedtime.  Abnormal gluteal crease -     doxycycline (VIBRA-TABS) 100 MG tablet; Take 1 tablet (100 mg total)  by mouth 2 (two) times daily. For 10 days.  Migraine without aura and without status migrainosus, not intractable -     Rimegepant Sulfate (NURTEC) 75 MG TBDP; Take 1 tablet by mouth as needed.   Doxycycline started for tear in gluteal crease and skin infection.  KOH sent for scaly rash fungal cream sent.   Discussed weakness and how she needs PT/OT to help strengthen. Not ready for referral. Discussed home exercises to do.   Trigger point injections done today. nurtec for migraine rescue. On preventative.   remeron for mood and sleep. Discussed side effects.   Trigger Point Injection   Pre-operative diagnosis: myofascial pain  Post-operative diagnosis: myofascial pain  After risks and benefits were explained including bleeding, infection, worsening of the pain,  damage to the area being injected, weakness, allergic reaction to medications, vascular injection, and nerve damage, signed consent was obtained.  All questions were answered.    The area of the trigger point was identified and the skin prepped three times with alcohol and the alcohol allowed to dry.  Next, a 22 gauge 1.5 inch needle was placed in the area of the trigger point.  Once reproduction of the pain was elicited and negative aspiration confirmed, the trigger point was injected and the needle removed.    The patient did tolerate the procedure well and there were not complications.    Medication used: 40 mg Kenalog; 4 mL 1% Mepivicaine.    Trigger points injected: 4    Trigger point(s) location(s):  bilateral neck.     [name]..Spent 60 minutes with patient and greater than 50 percent of visit spent counseling patient regarding treatment plan.

## 2019-01-13 NOTE — Progress Notes (Signed)
Can you send Adderall to [facility]?   She is wanting to go back on Amitripitline. Right hand itching. She had a fall recently and is not sure how it happened.

## 2019-01-14 ENCOUNTER — Encounter: Payer: Self-pay | Admitting: Physician Assistant

## 2019-01-14 DIAGNOSIS — G8929 Other chronic pain: Secondary | ICD-10-CM

## 2019-01-14 DIAGNOSIS — L4050 Arthropathic psoriasis, unspecified: Secondary | ICD-10-CM

## 2019-01-14 DIAGNOSIS — M79645 Pain in left finger(s): Secondary | ICD-10-CM

## 2019-01-14 LAB — UNLABELED: Test Ordered On Req: 8627

## 2019-01-15 NOTE — Progress Notes (Signed)
Please provide this information.

## 2019-01-18 ENCOUNTER — Telehealth: Payer: Self-pay | Admitting: Physician Assistant

## 2019-01-18 LAB — PAT ID TIQ DOC: Test Affected: 8627

## 2019-01-18 LAB — FUNGAL STAIN
FUNGAL SMEAR:: NONE SEEN
MICRO NUMBER:: 1125285
SPECIMEN QUALITY:: ADEQUATE

## 2019-01-18 NOTE — Telephone Encounter (Signed)
Received fax from [employer] and they denied coverage on Nurtec due to patient must also be treated with Elavil, Depakote or a beta blocker. Placing in providers box. - CF

## 2019-01-18 NOTE — Progress Notes (Signed)
No fungal elements. I would use topical steroids when flares.

## 2019-01-18 NOTE — Telephone Encounter (Signed)
Received fax for prior authorization on Nurtec sent through cover my meds waiting on determination. - CF °

## 2019-01-21 ENCOUNTER — Other Ambulatory Visit: Payer: Self-pay | Admitting: Physician Assistant

## 2019-01-22 ENCOUNTER — Encounter: Payer: Self-pay | Admitting: Physician Assistant

## 2019-01-23 DIAGNOSIS — R29898 Other symptoms and signs involving the musculoskeletal system: Secondary | ICD-10-CM | POA: Insufficient documentation

## 2019-01-23 DIAGNOSIS — L089 Local infection of the skin and subcutaneous tissue, unspecified: Secondary | ICD-10-CM | POA: Insufficient documentation

## 2019-01-23 NOTE — Telephone Encounter (Signed)
Last filled 09/07/2018 #30 with 1 refill.

## 2019-01-25 ENCOUNTER — Telehealth: Payer: Self-pay | Admitting: Physician Assistant

## 2019-01-25 MED ORDER — HYDROCODONE-ACETAMINOPHEN 5-325 MG PO TABS: ORAL_TABLET | ORAL | 0 refills | Status: DC

## 2019-01-25 NOTE — Telephone Encounter (Signed)
Resubmitted prior authorization for Nurtec through cover my meds waiting on determination. - CF

## 2019-01-25 NOTE — Telephone Encounter (Signed)
Sarah Mayer    I tried to redo the authorization today and they cancelled it I have to go through the appeals process and will be doing this tomorrow I have been working on referrals and my proficient que today. - CF

## 2019-01-25 NOTE — Telephone Encounter (Signed)
Any word on nurtec PA?

## 2019-01-27 NOTE — Telephone Encounter (Signed)
Cancelled have to do appeal - CF

## 2019-01-30 ENCOUNTER — Other Ambulatory Visit: Payer: Self-pay | Admitting: Neurology

## 2019-01-30 DIAGNOSIS — R059 Cough, unspecified: Secondary | ICD-10-CM

## 2019-01-30 DIAGNOSIS — R05 Cough: Secondary | ICD-10-CM

## 2019-01-30 MED ORDER — MONTELUKAST SODIUM 10 MG PO TABS: 10.0000 mg | ORAL_TABLET | Freq: Every day | ORAL | 1 refills | Status: DC

## 2019-01-31 ENCOUNTER — Other Ambulatory Visit: Payer: Self-pay | Admitting: Neurology

## 2019-01-31 DIAGNOSIS — G894 Chronic pain syndrome: Secondary | ICD-10-CM

## 2019-01-31 MED ORDER — DICLOFENAC SODIUM 1 % EX GEL: 4.0000 g | Freq: Four times a day (QID) | CUTANEOUS | 2 refills | Status: DC | PRN

## 2019-02-03 ENCOUNTER — Ambulatory Visit (INDEPENDENT_AMBULATORY_CARE_PROVIDER_SITE_OTHER): Payer: PRIVATE HEALTH INSURANCE | Admitting: Physician Assistant

## 2019-02-03 ENCOUNTER — Other Ambulatory Visit: Payer: Self-pay

## 2019-02-03 ENCOUNTER — Ambulatory Visit (INDEPENDENT_AMBULATORY_CARE_PROVIDER_SITE_OTHER): Payer: PRIVATE HEALTH INSURANCE

## 2019-02-03 ENCOUNTER — Encounter: Payer: Self-pay | Admitting: Physician Assistant

## 2019-02-03 VITALS — BP 130/57 | HR 80

## 2019-02-03 DIAGNOSIS — R058 Other specified cough: Secondary | ICD-10-CM

## 2019-02-03 DIAGNOSIS — M25552 Pain in left hip: Secondary | ICD-10-CM | POA: Diagnosis not present

## 2019-02-03 DIAGNOSIS — R519 Headache, unspecified: Secondary | ICD-10-CM | POA: Diagnosis not present

## 2019-02-03 DIAGNOSIS — R05 Cough: Secondary | ICD-10-CM

## 2019-02-03 DIAGNOSIS — L4050 Arthropathic psoriasis, unspecified: Secondary | ICD-10-CM

## 2019-02-03 DIAGNOSIS — G44319 Acute post-traumatic headache, not intractable: Secondary | ICD-10-CM

## 2019-02-03 DIAGNOSIS — L988 Other specified disorders of the skin and subcutaneous tissue: Secondary | ICD-10-CM

## 2019-02-03 DIAGNOSIS — L439 Lichen planus, unspecified: Secondary | ICD-10-CM

## 2019-02-03 DIAGNOSIS — Z131 Encounter for screening for diabetes mellitus: Secondary | ICD-10-CM

## 2019-02-03 DIAGNOSIS — R202 Paresthesia of skin: Secondary | ICD-10-CM

## 2019-02-03 DIAGNOSIS — E559 Vitamin D deficiency, unspecified: Secondary | ICD-10-CM

## 2019-02-03 DIAGNOSIS — F339 Major depressive disorder, recurrent, unspecified: Secondary | ICD-10-CM

## 2019-02-03 DIAGNOSIS — G8929 Other chronic pain: Secondary | ICD-10-CM

## 2019-02-03 DIAGNOSIS — Z13220 Encounter for screening for lipoid disorders: Secondary | ICD-10-CM

## 2019-02-03 MED ORDER — AMITRIPTYLINE HCL 100 MG PO TABS: 100.0000 mg | ORAL_TABLET | Freq: Every day | ORAL | 1 refills | Status: DC

## 2019-02-03 NOTE — Progress Notes (Signed)
Normal hip xray. No arthritis or degeneration.

## 2019-02-03 NOTE — Patient Instructions (Addendum)
Restart elavil?

## 2019-02-04 LAB — COMPLETE METABOLIC PANEL WITH GFR
AG Ratio: 1.7 (calc) (ref 1.0–2.5)
ALT: 21 U/L (ref 6–29)
AST: 17 U/L (ref 10–35)
Albumin: 4.2 g/dL (ref 3.6–5.1)
Alkaline phosphatase (APISO): 145 U/L — ABNORMAL HIGH (ref 31–125)
BUN: 14 mg/dL (ref 7–25)
CO2: 29 mmol/L (ref 20–32)
Calcium: 9.6 mg/dL (ref 8.6–10.2)
Chloride: 104 mmol/L (ref 98–110)
Creat: 1.05 mg/dL (ref 0.50–1.10)
GFR, Est African American: 73 mL/min/{1.73_m2} (ref >=60)
GFR, Est Non African American: 63 mL/min/{1.73_m2} (ref >=60)
Globulin: 2.5 g/dL (calc) (ref 1.9–3.7)
Glucose, Bld: 107 mg/dL — ABNORMAL HIGH (ref 65–99)
Potassium: 4.5 mmol/L (ref 3.5–5.3)
Sodium: 141 mmol/L (ref 135–146)
Total Bilirubin: 0.4 mg/dL (ref 0.2–1.2)
Total Protein: 6.7 g/dL (ref 6.1–8.1)

## 2019-02-04 LAB — LIPID PANEL W/REFLEX DIRECT LDL
Cholesterol: 163 mg/dL (ref <200)
HDL: 55 mg/dL (ref >=50)
LDL Cholesterol (Calc): 93 mg/dL (calc)
Non-HDL Cholesterol (Calc): 108 mg/dL (calc) (ref <130)
Total CHOL/HDL Ratio: 3 (calc) (ref <5.0)
Triglycerides: 64 mg/dL (ref <150)

## 2019-02-04 LAB — B12 AND FOLATE PANEL
Folate: 9.8 ng/mL
Vitamin B-12: 395 pg/mL (ref 200–1100)

## 2019-02-04 LAB — IRON,TIBC AND FERRITIN PANEL
%SAT: 18 % (calc) (ref 16–45)
Ferritin: 20 ng/mL (ref 16–232)
Iron: 69 ug/dL (ref 40–190)
TIBC: 374 mcg/dL (calc) (ref 250–450)

## 2019-02-04 LAB — HEMOGLOBIN A1C
Hgb A1c MFr Bld: 5.8 % of total Hgb — ABNORMAL HIGH (ref <5.7)
Mean Plasma Glucose: 120 (calc)
eAG (mmol/L): 6.6 (calc)

## 2019-02-04 LAB — VITAMIN D 25 HYDROXY (VIT D DEFICIENCY, FRACTURES): Vit D, 25-Hydroxy: 63 ng/mL (ref 30–100)

## 2019-02-04 LAB — TSH: TSH: 2.21 mIU/L

## 2019-02-06 ENCOUNTER — Other Ambulatory Visit: Payer: Self-pay | Admitting: Neurology

## 2019-02-06 MED ORDER — ONDANSETRON 8 MG PO TBDP: ORAL_TABLET | ORAL | 1 refills | Status: DC

## 2019-02-06 NOTE — Progress Notes (Signed)
Destyne,   Iron stores a little low. Cholesterol looks perfect. A1c pre-diabetes. Keep watching sugars/carbs. Vitamin D looks great. Thyroid normal. B12 looks good.   Luvenia Starch

## 2019-02-07 ENCOUNTER — Encounter: Payer: Self-pay | Admitting: Physician Assistant

## 2019-02-07 DIAGNOSIS — R519 Headache, unspecified: Secondary | ICD-10-CM | POA: Insufficient documentation

## 2019-02-07 DIAGNOSIS — G8929 Other chronic pain: Secondary | ICD-10-CM | POA: Insufficient documentation

## 2019-02-07 NOTE — Progress Notes (Signed)
Subjective:    Patient ID: Sarah Mayer, female    DOB: 07-30-1970, 48 y.o.   MRN: [id number]  HPI  Patient is a 48 year old obese female with migraines, OSA, Barrett's esophagus, IBS, hypothyroidism, hyperparathyroidism, psoriatic arthritis who presents to the clinic for follow-up and medication refills.  She stopped taking the Remeron and her hair stopped falling out.  Need to add to intolerance list.  She is continuing to have the headaches.  The trigger point injections did not help.  I did just realized that we had stopped the Elavil to see if it was causing any of her hallucinations.  Perhaps this was helping her headaches.  We will add this back. nurtec was not approved. Needs PA she has not heard anything.   She does complain of left hip catching and popping for last few weeks. Notice more when standing and externally rotating hip. No pain just some discomfort. No injury.   .. Active Ambulatory Problems    Diagnosis Date Noted  . Psoriatic arthritis ([place]) 03/24/2016  . Gastroesophageal reflux disease 03/24/2016  . GAD (generalized anxiety disorder) 03/24/2016  . Iron deficiency anemia 03/24/2016  . History of pulmonary embolism 03/27/2016  . History of DVT (deep vein thrombosis) 03/27/2016  . Chronic tension-type headache, not intractable 03/27/2016  . Primary insomnia 03/27/2016  . Bilateral lower extremity edema 03/27/2016  . Chronic pain syndrome 05/25/2016  . Morbid (severe) obesity due to excess calories ([place]) 05/27/2016  . Serum calcium elevated 08/30/2016  . Diarrhea 08/30/2016  . Collagenous colitis 10/18/2016  . Barrett's esophagus without dysplasia 10/18/2016  . Arthralgia 10/18/2016  . Memory changes 11/23/2016  . Major depression, recurrent, chronic ([place]) 11/23/2016  . OSA (obstructive sleep apnea) 11/23/2016  . Twitching 12/22/2016  . Upper airway cough syndrome 12/22/2016  . Bilateral hip pain 12/22/2016  . Tripping over things 01/12/2017  . Left arm  pain 01/12/2017  . Left elbow pain 01/12/2017  . Numbness and tingling in left arm 01/17/2017  . Word finding difficulty 03/15/2017  . Intractable migraine without aura and with status migrainosus 03/15/2017  . Edema of both feet 07/04/2017  . B12 deficiency 07/04/2017  . Acquired hypothyroidism 07/04/2017  . Vitamin D deficiency 07/04/2017  . Secondary hyperparathyroidism, non-renal ([place]) 07/04/2017  . Psoriasis 07/06/2017  . CFS (chronic fatigue syndrome) 07/06/2017  . Easy bruising 07/26/2017  . Myofascial pain 08/19/2017  . Itching 08/19/2017  . Yeast infection 08/19/2017  . IBS (irritable bowel syndrome) 12/03/2017  . Recurrent falls 12/29/2017  . Weakness 02/11/2018  . 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 Urinary frequency 02/14/2018  . Chronic left shoulder pain 03/15/2018  . Chronic pain of left thumb 03/15/2018  . 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 Jerking 09/12/2018  . Episode of confusion 09/12/2018  . Right leg pain 11/14/2018  . Elevated serum creatinine 11/14/2018  . Dry mouth 11/14/2018  . Bilateral leg weakness 01/23/2019  . Skin infection 01/23/2019   Resolved Ambulatory Problems    Diagnosis Date Noted  . Hyperparathyroidism, primary ([place]) 09/18/2016  . Rash and nonspecific skin eruption 10/18/2016  . Acute bronchitis 02/07/2018   Past Medical History:  Diagnosis Date  . Microscopic colitis   . Migraine   . Morbid obesity ([place]) 05/27/2016      Review of Systems See HPI.     Objective:   Physical Exam Vitals reviewed.  Constitutional:      Appearance: Normal appearance. She is obese.  HENT:     Head: Normocephalic and atraumatic.  Cardiovascular:     Rate and Rhythm: Normal rate and regular rhythm.     Pulses: Normal pulses.  Pulmonary:     Effort: Pulmonary effort is normal.     Breath sounds: Normal breath sounds.  Skin:    Comments: Multiple scabs and sores all over bilateral legs at different stages of healing.  Gluteal crease tear healing.    Neurological:     General: No focal deficit present.     Mental Status: She is alert and oriented to person, place, and time.  Psychiatric:        Mood and Affect: Mood normal.        Behavior: Behavior normal.           Assessment & Plan:  Marland KitchenMarland KitchenAaryana was seen today for follow-up.  Diagnoses and all orders for this visit:  Chronic nonintractable headache, unspecified headache type -     amitriptyline (ELAVIL) 100 MG tablet; Take 1 tablet (100 mg total) by mouth at bedtime.  Major depression, recurrent, chronic (HCC) -     amitriptyline (ELAVIL) 100 MG tablet; Take 1 tablet (100 mg total) by mouth at bedtime. -     COMPLETE METABOLIC PANEL WITH GFR  Post-viral cough syndrome -     amitriptyline (ELAVIL) 100 MG tablet; Take 1 tablet (100 mg total) by mouth at bedtime. -     COMPLETE METABOLIC PANEL WITH GFR  Lichen planus -     COMPLETE METABOLIC PANEL WITH GFR  Psoriatic arthritis (HCC) -     DG Hip Unilat W OR W/O Pelvis 2-3 Views Left -     COMPLETE METABOLIC PANEL WITH GFR  Left hip pain -     DG Hip Unilat W OR W/O Pelvis 2-3 Views Left -     COMPLETE METABOLIC PANEL WITH GFR  Screening for lipid disorders -     Lipid Panel w/reflex Direct LDL  Screening for diabetes mellitus -     Hemoglobin A1c -     COMPLETE METABOLIC PANEL WITH GFR  Vitamin D deficiency -     Vitamin D (25 hydroxy)  Morbidly obese (HCC) -     TSH  Paresthesia -     B12 and Folate Panel -     Ferritin -     Fe+TIBC+Fer   Needs labs before end of year.   Restart Elavil to see if will help with headaches.   Gluteal tear improving. Keep dry and clean.   Added remeron to intolerance list.   Get xray to look at catching left hip.   Follow up in 3 months.

## 2019-02-07 NOTE — Telephone Encounter (Signed)
Do we know anything on approval of nurtec?

## 2019-02-08 ENCOUNTER — Ambulatory Visit: Payer: BLUE CROSS/BLUE SHIELD

## 2019-02-08 ENCOUNTER — Telehealth: Payer: BLUE CROSS/BLUE SHIELD

## 2019-02-08 ENCOUNTER — Encounter: Payer: Self-pay | Admitting: Physician Assistant

## 2019-02-08 DIAGNOSIS — Z20828 Contact with and (suspected) exposure to other viral communicable diseases: Secondary | ICD-10-CM

## 2019-02-08 NOTE — Progress Notes
PATIENT: Vanessa Terry  MRN: [id number]  DOB: 07-05-1970  DATE OF SERVICE: 02/08/2019    COVID Screen:  No Fever   No Headache  No Fatigue  No rhinorrhea.   No Muscle Aches  No Sore Throat  No Dizziness  No Cough  No SOB  No Diarrhea, Vomiting, and Abdominal Pain  No Loss of Smell and Taste  No International Travel in past 14 days  Yes close contact with a known COVID19 case in past 14 days     High Risk:  Yes Exposed to a confirmed COVID 19 within the last 14 days.  Dad possible exposure.  He was not tested for weeks.   Not a Research scientist (physical sciences)  Not Immunocompromised or with high risk medical condition  Not 62yrs or older  Yes Living with someone who is immunocompromised or with high risk medical conditions  Yes Living in a Group Living Home  (Nursing home, Dorm, etc)  (Father was living in SNF)    Phone and In person visit. Failed Video Visit.     HPI/ROS:   Pt is a 48 yo female that is presenting today for COVID testing.  Her father had been in assisted living for five months. She noted that for two weeks he was looking worse.  He came to her house to live with them.  Next day after brought to their home the nursing home had three people with COVID and then an additional person a day later- SNF contacted them. This past Monday had fever of 100 her father and today has fever of 99.  Sunday started with a cough her father. He has had a stroke so not communicating the same since. He is on Hospice and was tested through them, but his result will come in five days per daughter. She would like to be tested and so would her husband.           Exam:  VS: None available today as this is a Telehealth Visit.     Last Recorded Vital Signs:    02/08/19 1542   BP: 110/78   Pulse: 54   Temp: 36.9 ?C (98.4 ?F)   SpO2: 98%       This was a Telehealth visit. The extent of the physical exam was limited to the elements below.     General: Yes Appears alert.  No Acute distress. Yes Well groomed. Respiratory: Yes Breathing normally.  No Tachypnea  Nose: No Nasal discharge or redness  Skin: No Diaphoresis noted. No Rash  Mental Status: Alert and oriented to time, place and person.  Recent and remote memory are intact. Mood and affect are normal.    Assessment and Plan:  1. Suspected COVID-19 virus infection  - COVID-19 and Influenza - PCR, (Sup[place]) Mid-turbinate; Future            Telemedicine Questions    Did you receive  consent from Patient? Yes  Patient Consent to Telehealth Questionnaire   [facility] TELEHEALTH PRECHECKIN QUESTIONS 02/08/2019   By clicking ''I Agree'', I consent to the below:  I Agree     - I agree  to be treated via a video visit and acknowledge that I may be liable for any relevant copays or coinsurance depending on my insurance plan.  - I understand that this video visit is offered for my convenience and I am able to cancel and reschedule for an in-person appointment if I desire.  - I also acknowledge that sensitive medical information  may be discussed during this video visit appointment and that it is my responsibility to locate myself in a location that ensures privacy to my own level of comfort.  - I also acknowledge that I should not be participating in a video visit in a way that could cause danger to myself or to those around me (such as driving or walking).  If my provider is concerned about my safety, I understand that they have the right to terminate the visit.       Where is the Patient Located? Home    Time Spent (in minutes) 10 minutes    Signed: Brandy Hale 02/08/2019 4:21 PM   Author:  Brandy Hale, DO    __________________________________________________________________________________ I attest that two-way communication with audio and video was established with this patient and that the patient gave consent for the telemedicine visit.  I personally obtained the history, reviewed all pertinent notes, laboratory results, and imaging, and formulated all medical decisions as discussed above.  This entire encounter was performed without the assistance of a resident, fellow, PA, or NP.

## 2019-02-09 LAB — COVID-19 and Influenza A/B PCR

## 2019-02-10 ENCOUNTER — Encounter: Payer: Self-pay | Admitting: Physician Assistant

## 2019-02-10 LAB — HM MAMMOGRAPHY

## 2019-02-10 NOTE — Telephone Encounter (Signed)
Thanks

## 2019-02-10 NOTE — Telephone Encounter (Signed)
Sarah Mayer   I called insurance on Wednesday to start appeal process and had to type a letter that needed signed not knowing why you were out of office I told them I would fax letter today.  I got it signed and faxed it this morning so hopefully they will approve the medication now. - CF

## 2019-02-10 NOTE — Telephone Encounter (Signed)
Has the PA been started for nurtec? Please respond to patient about next step.

## 2019-02-13 ENCOUNTER — Ambulatory Visit: Payer: Commercial Managed Care - PPO

## 2019-02-14 NOTE — Telephone Encounter (Signed)
PA not covered and went to appeal and was unable to appeal the denial.

## 2019-02-15 ENCOUNTER — Other Ambulatory Visit: Payer: Self-pay | Admitting: Physician Assistant

## 2019-02-15 DIAGNOSIS — M7918 Myalgia, other site: Secondary | ICD-10-CM

## 2019-02-15 DIAGNOSIS — R21 Rash and other nonspecific skin eruption: Secondary | ICD-10-CM

## 2019-02-15 DIAGNOSIS — K2270 Barrett's esophagus without dysplasia: Secondary | ICD-10-CM

## 2019-02-20 ENCOUNTER — Telehealth: Payer: BLUE CROSS/BLUE SHIELD

## 2019-02-20 ENCOUNTER — Other Ambulatory Visit: Payer: Self-pay | Admitting: Physician Assistant

## 2019-02-20 DIAGNOSIS — R05 Cough: Secondary | ICD-10-CM

## 2019-02-20 DIAGNOSIS — Z20828 Contact with and (suspected) exposure to other viral communicable diseases: Secondary | ICD-10-CM

## 2019-02-20 DIAGNOSIS — B349 Viral infection, unspecified: Secondary | ICD-10-CM

## 2019-02-20 DIAGNOSIS — L281 Prurigo nodularis: Secondary | ICD-10-CM

## 2019-02-20 NOTE — Telephone Encounter
Called to complete pre-rooming questions before today's video visit

## 2019-02-20 NOTE — Progress Notes
PATIENT: Vanessa Terry  MRN: [id number]  DOB: January 05, 1971  DATE OF SERVICE: 02/20/2019    COVID Screen:  No Fever   Yes Chills in the past  Yes Fatigue  Yes HA in the past  No Runny Nose  Yes Muscle Aches in her back  No Sore Throat  Yes Dizziness  Yes Cough. Cannot take a deep breath as feels burning sensation. (Denies Chest Pain)  No SOB  No Diarrhea or Vomiting   Yes Abdominal Pain  Yes Loss of Smell and Taste that is coming back  No International Travel in past 14 days   Yes close contact with a known COVID19 case in past 14 days     High Risk:  Yes Exposed to a confirmed COVID 19 within the last 14 days  Not  a Research scientist (physical sciences)  Not Immunocompromised or with high risk medical condition  Not 70yrs or older  Not Living with someone who is immunocompromised or with high risk medical conditions  Not Living in a Group Living Home  (Nursing home, Dorm, etc)      HPI/ROS:   Pt is a 48 yo female that is presenting today for a COVID question.   She feels she did her COVID test too early.  Dad died on Christmas.  Dad had COVID and other underlying conditions. He was on Hospice.  Last two nights she feels a burning sensation in her chest when takes a deep breath.  No chest pain.   Pt feels like heaviness in chest with taking a breath in, and burning in chest.  Denies cardiac symptom.  Pt has some back aches. Sense of smell is coming back.     Exam:  VS: None available today as this is a Telehealth Visit.     This was a Telehealth visit. The extent of the physical exam was limited to the elements below.     General: Yes Appears alert.  No Acute distress. Yes Well groomed.  Respiratory: Yes Breathing normally.  No Tachypnea Pt is speaking in full sentences. Pt has dry cough.   Nose: No Nasal discharge or redness  Skin: No Diaphoresis noted. No Rash  Mental Status: Alert and oriented to time, place and person.  Recent and remote memory are intact. Mood and affect are normal.    Assessment and Plan:  1. Viral infection 2. Cough with exposure to COVID-19 virus      Pt will do breathing exercises we discussed every 1 1/2 hours to 2 hours while awake.   Pt will walk in her home 15 minutes BID to TID.  Pt declined to be retested.   ER precautions given.       Telemedicine Questions    Did you receive  consent from Patient? Yes  Patient Consent to Telehealth Questionnaire   [facility] TELEHEALTH PRECHECKIN QUESTIONS 02/20/2019   By clicking ''I Agree'', I consent to the below:  I Agree     - I agree  to be treated via a video visit and acknowledge that I may be liable for any relevant copays or coinsurance depending on my insurance plan.  - I understand that this video visit is offered for my convenience and I am able to cancel and reschedule for an in-person appointment if I desire.  - I also acknowledge that sensitive medical information may be discussed during this video visit appointment and that it is my responsibility to locate myself in a location that ensures privacy to my own level  of comfort.  - I also acknowledge that I should not be participating in a video visit in a way that could cause danger to myself or to those around me (such as driving or walking).  If my provider is concerned about my safety, I understand that they have the right to terminate the visit.       Where is the Patient Located? Home      Time Spent (in minutes) 14 minutes    Signed: Brandy Hale 02/20/2019 11:40 AM   Author:  Brandy Hale, DO    __________________________________________________________________________________   I attest that two-way communication with audio and video was established with this patient and that the patient gave consent for the telemedicine visit.  I personally obtained the history, reviewed all pertinent notes, laboratory results, and imaging, and formulated all medical decisions as discussed above.  This entire encounter was performed without the assistance of a resident, fellow, PA, or NP.

## 2019-02-23 ENCOUNTER — Other Ambulatory Visit: Payer: Self-pay | Admitting: Physician Assistant

## 2019-02-23 DIAGNOSIS — G894 Chronic pain syndrome: Secondary | ICD-10-CM

## 2019-02-23 DIAGNOSIS — M7918 Myalgia, other site: Secondary | ICD-10-CM

## 2019-02-23 DIAGNOSIS — R600 Localized edema: Secondary | ICD-10-CM

## 2019-02-28 ENCOUNTER — Telehealth: Payer: Self-pay | Admitting: Neurology

## 2019-02-28 NOTE — Telephone Encounter (Signed)
Sarah Mayer left a voicemail that she saw patient yesterday and wants to discuss her with [facility]. She can be reached at [phone]. She did not specify what kind of provider she is.

## 2019-03-02 ENCOUNTER — Encounter: Payer: Self-pay | Admitting: Physician Assistant

## 2019-03-02 ENCOUNTER — Telehealth: Payer: Self-pay

## 2019-03-02 DIAGNOSIS — L981 Factitial dermatitis: Secondary | ICD-10-CM

## 2019-03-02 NOTE — Telephone Encounter (Signed)
Sarah Mayer with [facility] called and would like to speak with Lesly Rubenstein about a treatment plan.

## 2019-03-03 ENCOUNTER — Encounter: Payer: Self-pay | Admitting: Physician Assistant

## 2019-03-06 NOTE — Telephone Encounter (Signed)
Called and left msg for call back.

## 2019-03-06 NOTE — Telephone Encounter (Signed)
Called and left msg for call back.

## 2019-03-07 DIAGNOSIS — L981 Factitial dermatitis: Secondary | ICD-10-CM | POA: Insufficient documentation

## 2019-03-07 MED ORDER — OLANZAPINE 2.5 MG PO TABS: 2.5000 mg | ORAL_TABLET | Freq: Every day | ORAL | 1 refills | Status: DC

## 2019-03-07 NOTE — Telephone Encounter (Signed)
Done and sent zyprexa as discussed with Myrtie Soman, dermatologist for neurotic excoriations.

## 2019-03-07 NOTE — Telephone Encounter (Signed)
She called again and left another vm for a call back at [phone].

## 2019-03-08 NOTE — Telephone Encounter (Signed)
Pt aware of information below via a MyChart message sent to her by [name], PA-C

## 2019-03-09 MED ORDER — PAROXETINE HCL 10 MG PO TABS
ORAL_TABLET | ORAL | 1 refills | 30.00000 days
Start: 2019-03-09 — End: ?

## 2019-03-09 MED ORDER — MIRABEGRON ER 25 MG PO TB24: 25.0000 mg | ORAL_TABLET | Freq: Every day | ORAL | 0 refills | Status: DC

## 2019-03-12 ENCOUNTER — Other Ambulatory Visit: Payer: Self-pay | Admitting: Physician Assistant

## 2019-03-12 DIAGNOSIS — R5382 Chronic fatigue, unspecified: Secondary | ICD-10-CM

## 2019-03-12 DIAGNOSIS — G9332 Myalgic encephalomyelitis/chronic fatigue syndrome: Secondary | ICD-10-CM

## 2019-03-14 ENCOUNTER — Inpatient Hospital Stay: Payer: PRIVATE HEALTH INSURANCE

## 2019-03-14 ENCOUNTER — Ambulatory Visit: Payer: BLUE CROSS/BLUE SHIELD

## 2019-03-14 ENCOUNTER — Encounter: Payer: Self-pay | Admitting: Physician Assistant

## 2019-03-14 MED ADMIN — GADOBUTROL 1 MMOL/ML IV SOLN: 7.5 mL | INTRAVENOUS | @ 23:00:00 | Stop: 2019-03-14 | NDC 50419032511

## 2019-03-15 ENCOUNTER — Ambulatory Visit: Payer: BLUE CROSS/BLUE SHIELD

## 2019-03-15 ENCOUNTER — Ambulatory Visit: Payer: PRIVATE HEALTH INSURANCE

## 2019-03-15 ENCOUNTER — Encounter: Payer: Self-pay | Admitting: Physician Assistant

## 2019-03-15 DIAGNOSIS — H6692 Otitis media, unspecified, left ear: Secondary | ICD-10-CM

## 2019-03-15 DIAGNOSIS — H6123 Impacted cerumen, bilateral: Secondary | ICD-10-CM

## 2019-03-15 MED ORDER — AMOXICILLIN 500 MG PO CAPS
500 mg | ORAL_CAPSULE | Freq: Three times a day (TID) | ORAL | 0 refills | Status: AC
Start: 2019-03-15 — End: ?

## 2019-03-15 MED ORDER — OXYBUTYNIN CHLORIDE ER 5 MG PO TB24: 5.0000 mg | ORAL_TABLET | Freq: Every day | ORAL | 3 refills | Status: DC

## 2019-03-15 NOTE — Telephone Encounter
Spoke with pt regarding MRI of breast results.  D/W pt referral for breast surgeon to see recommendations regarding pain that she is having on the left breast.  Pt is ok with referral.   Pt is having issues with ear pain. No drainage.  Freda Munro will assist patient with making appt for her ear.

## 2019-03-15 NOTE — Progress Notes
Patient: Vanessa Terry  DOB: [birth year]  MRN: [id number]  Date: 03/15/2019       Chief Complaint:   Chief Complaint   Patient presents with   ? Otalgia     Patient states that she has had left ear pain and possible water in left ear x1 week       Subjective   Pt is a 49 yo female that is here for left ear crackling discomfort. Pt has noted it for a few days.       PMHX, PSHX, Meds, Allergies, Soc Hx, and etc were all reviewed today.     No additional contributory past medical, surgical, family or social history except as documented in care connect.   Complete 14 pointReview of Systems - ROS negative except as documented above     Past Medical History:   Diagnosis Date   ? Anemia    ? Anxiety    ? Coccyx pain    ? Constipation    ? Depression    ? IBS (irritable bowel syndrome)    ? Incontinence in female    ? Tinnitus of left ear    ? Vertigo      Patient Active Problem List   Diagnosis   ? Tinnitus of left ear   ? Vertigo   ? Coccyx pain   ? Toe pain, left   ? History of in vitro fertilization   ? Abscess   ? Hyperlipidemia, unspecified   ? Mood changes   ? Family history of colorectal cancer   ? Family history of colon cancer requiring screening colonoscopy   ? Depression   ? Incontinence in female       Meds:  Outpatient Medications Prior to Visit   Medication Sig   ? PARoxetine 10 mg tablet TAKE 1 TABLET BY MOUTH EVERY DAY.     No facility-administered medications prior to visit.        Allergies   Allergen Reactions   ? Sulfamethoxazole-Trimethoprim      Eye swelling            Objective   Review of Systems   ROS Normal unless checked below:    Check if Abnormal  Note Positive Findings    []  Constit      []  Eyes      [x]  ENT      []  Thyroid     []  Resp      []  Imm/All      []  CV      []  GI      []  GU      []  Endo      []  MS      []  Skin      []  Heme/Lymph      []  Neuro      []  Psych     []  Other Vitals: BP 106/66  ~ Pulse 54  ~ Temp 36.2 ?C (97.1 ?F) (Tympanic)  ~ Resp 14  ~ Ht 5' 4.17'' (1.63 m)  ~ Wt 146 lb 12.8 oz (66.6 kg)  ~ SpO2 99%  ~ BMI 25.06 kg/m?   Gen: NAD  Ears: B/L Cerumen in both ears.  TM's not visible due to cerumen  Neuro: Alert and oriented.      Check of ears after lavage: Right ear: TM intact normal with thin sheet of cerumen in canal at periphery.  Left ear: Pt had most of the cerumen removed from the canal.  Top of the  TM seen and appears slightly erythematous.  Canal that is visible appears to be normal     PH-9 TOTAL SCORE [place] 12/04/2014 04/22/2018 11/15/2018   Total Score 12 4 3    Some encounter information is confidential and restricted. Go to Review Flowsheets activity to see all data.       Labs/Studies:  Lab Results   Component Value Date    NA 141 08/18/2018    K 4.7 08/18/2018    CL 105 08/18/2018    CO2 21 08/18/2018    BUN 15 08/18/2018    CALCIUM 9.6 08/18/2018    ALKPHOS 42 08/18/2018    AST 23 08/18/2018    ALT 13 08/18/2018    BILITOT 1.1 08/18/2018    GLUCOSE 80 08/18/2018     Lab Results   Component Value Date    WBC 6.29 04/29/2018    HGB 13.1 04/29/2018    HCT 41.2 04/29/2018    PLT 333 04/29/2018    RBC 4.30 04/29/2018    MCV 95.8 04/29/2018         There is no immunization history on file for this patient.    Assessment /Plan     Diagnoses and all orders for this visit:    Bilateral impacted cerumen  -     TBOC - Ear Wax Removal, Lavage, Bilateral    Acute left otitis media  -     amoxicillin 500 mg capsule; Take 1 capsule (500 mg total) by mouth three (3) times daily for 10 days.      Will recheck her left ear in about 10 days.  Take antibiotic until done.    Return in about 10 days (around 03/25/2019) for left ear.    Brandy Hale, DO         The above plan of care, diagnosis, orders, and follow-up were discussed with the patient.  Questions related to this recommended plan of care were answered.

## 2019-03-15 NOTE — Addendum Note (Signed)
Addended by: Jomarie Longs on: 03/15/2019 12:44 PM   Modules accepted: Orders

## 2019-03-16 MED ORDER — PAROXETINE HCL 10 MG PO TABS
ORAL_TABLET | ORAL | 2 refills | 30.00000 days | Status: AC
Start: 2019-03-16 — End: ?

## 2019-03-16 NOTE — Telephone Encounter
Called pt she is scheduled 02/01 and send her a message on mychart with referral information

## 2019-03-20 ENCOUNTER — Ambulatory Visit: Payer: Commercial Managed Care - PPO

## 2019-03-24 NOTE — Telephone Encounter (Signed)
Received an approval from [employer] that Aciphex was approved from 03/24/2019 through 03/23/2020. Pharmacy aware.

## 2019-03-27 ENCOUNTER — Ambulatory Visit: Payer: BLUE CROSS/BLUE SHIELD

## 2019-03-27 ENCOUNTER — Encounter: Payer: Self-pay | Admitting: Physician Assistant

## 2019-03-31 ENCOUNTER — Other Ambulatory Visit: Payer: Self-pay | Admitting: Physician Assistant

## 2019-03-31 DIAGNOSIS — R05 Cough: Secondary | ICD-10-CM

## 2019-03-31 DIAGNOSIS — R053 Chronic cough: Secondary | ICD-10-CM

## 2019-03-31 NOTE — Telephone Encounter (Signed)
[facility] pharmacy requesting med refill for Wal-finate allergy medication. Unable to refill medication. Rx not listed in pt's active med list. Pls send a rx to the pharmacy. Thanks.

## 2019-04-03 ENCOUNTER — Ambulatory Visit: Payer: PRIVATE HEALTH INSURANCE

## 2019-04-15 ENCOUNTER — Encounter: Payer: Self-pay | Admitting: Physician Assistant

## 2019-04-18 ENCOUNTER — Telehealth: Payer: Self-pay | Admitting: Physician Assistant

## 2019-04-18 NOTE — Telephone Encounter (Signed)
Received fax for PA on [employer] sent through cover my meds and received authorization.   Case Id: [id number] Valid: 04/17/19 - 04/16/2020 -CF

## 2019-04-24 ENCOUNTER — Telehealth (INDEPENDENT_AMBULATORY_CARE_PROVIDER_SITE_OTHER): Payer: PRIVATE HEALTH INSURANCE | Admitting: Physician Assistant

## 2019-04-24 VITALS — Temp 99.9°F | Ht 64.0 in | Wt 364.0 lb

## 2019-04-24 DIAGNOSIS — L981 Factitial dermatitis: Secondary | ICD-10-CM | POA: Diagnosis not present

## 2019-04-24 DIAGNOSIS — R058 Other specified cough: Secondary | ICD-10-CM

## 2019-04-24 DIAGNOSIS — B370 Candidal stomatitis: Secondary | ICD-10-CM

## 2019-04-24 DIAGNOSIS — R053 Chronic cough: Secondary | ICD-10-CM

## 2019-04-24 DIAGNOSIS — R05 Cough: Secondary | ICD-10-CM | POA: Diagnosis not present

## 2019-04-24 MED ORDER — PREDNISONE 20 MG PO TABS: ORAL_TABLET | ORAL | 0 refills | Status: DC

## 2019-04-24 MED ORDER — NYSTATIN 100000 UNIT/ML MT SUSP: 500000.0000 [IU] | Freq: Four times a day (QID) | OROMUCOSAL | 0 refills | Status: DC

## 2019-04-24 NOTE — Progress Notes (Deleted)
Feeling bad on and off since January with low grade fevers 2 weeks of dry, hard cough Upper part of chest hurts/tight Body aches Headaches Nausea   No sore throat, chills, diarrhea, vomiting  Has not taken anything OTC

## 2019-04-25 ENCOUNTER — Encounter: Payer: Self-pay | Admitting: Physician Assistant

## 2019-04-25 MED ORDER — OLANZAPINE 5 MG PO TABS: 5.0000 mg | ORAL_TABLET | Freq: Every day | ORAL | 0 refills | Status: DC

## 2019-04-25 MED ORDER — HYDROCOD POLST-CPM POLST ER 10-8 MG/5ML PO SUER: 5.0000 mL | Freq: Two times a day (BID) | ORAL | 0 refills | Status: DC | PRN

## 2019-04-25 NOTE — Progress Notes (Signed)
Patient ID: Sarah Mayer, female   DOB: [birth year], 49 y.o.   MRN: [id number] .Marland KitchenVirtual Visit via Video Note  I connected with Sarah Mayer on 04/24/2019 at  3:20 PM EST by a video enabled telemedicine application and verified that I am speaking with the correct person using two identifiers.  Location: Patient: home Provider: clinic   I discussed the limitations of evaluation and management by telemedicine and the availability of in person appointments. The patient expressed understanding and agreed to proceed.  History of Present Illness: [name] who calls into the clinic today to discuss ongoing cough that is worsening. She has really been feeling pretty bad since jan. She has had low grade fevers off and on. She has had 2 good weeks of dry hard cough. The upper part of her chest hurts and tight. She has body aches, headaches, nausea. Denies any ST, chills, Diarrhea, vomiting. Not taken any OTC. Last time checked for covid 1/22. Last abx a few months ago.   She is having some irritation in her mouth like thrush. Would like mouthwash.   She is using olanzapine for neurotic excoriations. Increased dose but patient has not done the increase yet. Needs rx.    .. Active Ambulatory Problems    Diagnosis Date Noted  . Psoriatic arthritis (HCC) 03/24/2016  . Gastroesophageal reflux disease 03/24/2016  . GAD (generalized anxiety disorder) 03/24/2016  . Iron deficiency anemia 03/24/2016  . History of pulmonary embolism 03/27/2016  . History of DVT (deep vein thrombosis) 03/27/2016  . Chronic tension-type headache, not intractable 03/27/2016  . Primary insomnia 03/27/2016  . Bilateral lower extremity edema 03/27/2016  . Chronic pain syndrome 05/25/2016  . Morbid (severe) obesity due to excess calories (HCC) 05/27/2016  . Serum calcium elevated 08/30/2016  . Diarrhea 08/30/2016  . Collagenous colitis 10/18/2016  . Barrett's esophagus without dysplasia 10/18/2016  .  Arthralgia 10/18/2016  . Memory changes 11/23/2016  . Major depression, recurrent, chronic (HCC) 11/23/2016  . OSA (obstructive sleep apnea) 11/23/2016  . Twitching 12/22/2016  . Upper airway cough syndrome 12/22/2016  . Bilateral hip pain 12/22/2016  . Tripping over things 01/12/2017  . Left arm pain 01/12/2017  . Left elbow pain 01/12/2017  . Numbness and tingling in left arm 01/17/2017  . Word finding difficulty 03/15/2017  . Intractable migraine without aura and with status migrainosus 03/15/2017  . Edema of both feet 07/04/2017  . B12 deficiency 07/04/2017  . Acquired hypothyroidism 07/04/2017  . Vitamin D deficiency 07/04/2017  . Secondary hyperparathyroidism, non-renal (HCC) 07/04/2017  . Psoriasis 07/06/2017  . CFS (chronic fatigue syndrome) 07/06/2017  . Easy bruising 07/26/2017  . Myofascial pain 08/19/2017  . Itching 08/19/2017  . Yeast infection 08/19/2017  . IBS (irritable bowel syndrome) 12/03/2017  . Recurrent falls 12/29/2017  . Weakness 02/11/2018  . 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 Urinary frequency 02/14/2018  . Chronic left shoulder pain 03/15/2018  . Chronic pain of left thumb 03/15/2018  . 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 Jerking 09/12/2018  . Episode of confusion 09/12/2018  . Right leg pain 11/14/2018  . Elevated serum creatinine 11/14/2018  . Dry mouth 11/14/2018  . Bilateral leg weakness 01/23/2019  . Skin infection 01/23/2019  . Chronic nonintractable headache 02/07/2019  . 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   Resolved Ambulatory Problems    Diagnosis Date Noted  . Hyperparathyroidism, primary (HCC) 09/18/2016  . Rash and nonspecific skin eruption 10/18/2016  . Acute bronchitis 02/07/2018   Past Medical History:  Diagnosis Date  . Microscopic colitis   . Migraine   . Morbid obesity ([place]) 05/27/2016   Reviewed med, allergy, problem list.    Observations/Objective: No acute distress. Normal breathing.  Dry cough on exam.  Normal appearance.    .. Today's Vitals   04/24/19 1511  Temp: 99.9 F (37.7 C)  TempSrc: Oral  Weight: (!) 364 lb (165.1 kg)  Height: 5\' 4"  (1.626 m)   Body mass index is 62.48 kg/m.    Assessment and Plan: Marland KitchenMarland KitchenAlaisa was seen today for cough.  Diagnoses and all orders for this visit:  Persistent cough -     predniSONE (DELTASONE) 20 MG tablet; Take 3 tablets for 3 days, take 2 tablets for 3 days, take 1 tablet for 3 days, then take 1/2 tablet for 4 days. -     chlorpheniramine-HYDROcodone ([place]) 10-8 MG/5ML SUER; Take 5 mLs by mouth every 12 (twelve) hours as needed.  Neurotic excoriations -     OLANZapine (ZYPREXA) 5 MG tablet; Take 1 tablet (5 mg total) by mouth at bedtime.  Upper airway cough syndrome -     predniSONE (DELTASONE) 20 MG tablet; Take 3 tablets for 3 days, take 2 tablets for 3 days, take 1 tablet for 3 days, then take 1/2 tablet for 4 days.  Oral thrush -     nystatin (MYCOSTATIN) 100000 UNIT/ML suspension; Take 5 mLs (500,000 Units total) by mouth 4 (four) times daily. Swish for 30 seconds and spit out.  pt has been tested multiple times for covid and negative all times.   I suspect this is more upper airway cough syndrome with some bronchitis. Prednisone taper sent. With tussionex. Discussed signs of infection. Call with update on Wednesday.   Nystatin given for thrush.   Increased olanzpine. Sent refill.    Follow Up Instructions:    I discussed the assessment and treatment plan with the patient. The patient was provided an opportunity to ask questions and all were answered. The patient agreed with the plan and demonstrated an understanding of the instructions.   The patient was advised to call back or seek an in-person evaluation if the symptoms worsen or if the condition fails to improve as anticipated.  I provided 25 minutes of non-face-to-face time during this encounter.   Iran Planas, PA-C

## 2019-04-26 ENCOUNTER — Encounter: Payer: Self-pay | Admitting: Physician Assistant

## 2019-04-26 ENCOUNTER — Other Ambulatory Visit: Payer: Self-pay | Admitting: Physician Assistant

## 2019-05-05 ENCOUNTER — Ambulatory Visit: Payer: BLUE CROSS/BLUE SHIELD

## 2019-05-05 ENCOUNTER — Other Ambulatory Visit: Payer: Self-pay | Admitting: Physician Assistant

## 2019-05-05 DIAGNOSIS — N3000 Acute cystitis without hematuria: Secondary | ICD-10-CM

## 2019-05-05 DIAGNOSIS — R300 Dysuria: Secondary | ICD-10-CM

## 2019-05-05 DIAGNOSIS — G43011 Migraine without aura, intractable, with status migrainosus: Secondary | ICD-10-CM

## 2019-05-05 MED ORDER — NITROFURANTOIN MONOHYD MACRO 100 MG PO CAPS
100 mg | ORAL_CAPSULE | Freq: Two times a day (BID) | ORAL | 0 refills | Status: AC
Start: 2019-05-05 — End: ?

## 2019-05-05 MED ORDER — PHENAZOPYRIDINE HCL 200 MG PO TABS
200 mg | ORAL_TABLET | Freq: Three times a day (TID) | ORAL | 0 refills | Status: AC | PRN
Start: 2019-05-05 — End: ?

## 2019-05-05 NOTE — Progress Notes
Name: Vanessa Terry  MRN: [id number]    Chief Complaint   Patient presents with   ? Urinary Tract Infection     frequent urination and pain x 2 days         HPI:      Vanessa Terry is a 49 y.o. female with urinary frequency, burning and ?smelly urine past two days.  Headed out of town now to [place] later today.    Medications that the patient states to be currently taking   Medication Sig   ? PAROXETINE 10 mg tablet TAKE 1 TABLET BY MOUTH EVERY DAY     Allergies   Allergen Reactions   ? Sulfamethoxazole-Trimethoprim      Eye swelling     Patient Active Problem List   Diagnosis   ? Tinnitus of left ear   ? Vertigo   ? Coccyx pain   ? Toe pain, left   ? History of in vitro fertilization   ? Abscess   ? Hyperlipidemia, unspecified   ? Mood changes   ? Family history of colorectal cancer   ? Family history of colon cancer requiring screening colonoscopy   ? Depression   ? Incontinence in female     Past Medical History:   Diagnosis Date   ? Anemia    ? Anxiety    ? Coccyx pain    ? Constipation    ? Depression    ? IBS (irritable bowel syndrome)    ? Incontinence in female    ? Tinnitus of left ear    ? Vertigo      Past Surgical History:   Procedure Laterality Date   ? CESAREAN SECTION  2014   ? KNEE SURGERY  1994    in [place], arthroscopic   ? LAPAROSCOPY DIAGNOSTIC / BIOPSY / ASPIRATION / LYSIS  2011   ? MYOMECTOMY  2012    [facility]   ? OVARY SURGERY  2011   ? REMOVAL OF R OVARIAN CYST  2011    [facility]      Social History     Socioeconomic History   ? Marital status: Married     Spouse name: Not on file   ? Number of children: Not on file   ? Years of education: Not on file   ? Highest education level: Not on file   Occupational History   ? Not on file   Social Needs   ? Financial resource strain: Not on file   ? Food insecurity     Worry: Not on file     Inability: Not on file   ? Transportation needs     Medical: Not on file     Non-medical: Not on file   Tobacco Use   ? Smoking status: Former Smoker   ? Smokeless tobacco: Never Used   ? Tobacco comment: quit 10 yrs ago, smoked socially x15 yrs   Substance and Sexual Activity   ? Alcohol use: No     Alcohol/week: 0.0 oz   ? Drug use: No   ? Sexual activity: Yes     Partners: Male     Birth control/protection: None   Lifestyle   ? Physical activity     Days per week: 2 days     Minutes per session: 30 min   ? Stress: Rather much   Relationships   ? Social Wellsite geologist on phone: Not on file     Gets  together: Not on file     Attends religious service: Not on file     Active member of club or organization: Not on file     Attends meetings of clubs or organizations: Not on file     Relationship status: Not on file   Other Topics Concern   ? Not on file   Social History Narrative    Home: Lives in [place] with husband and 2 kids (twins); originally from [place], then [place] x15 yrs moved here in 2010; recent changes: no changes other than potentially moving dad to come live with her (in [place]) as she is DPOA and managing his health care, going back and forth to [place] to are for dad;  living situation and social supports: good    Family and Relationships: sister lives in [place], one in [place], 2 brothers in [place], dad is in [place] as above. Husband and kids as above.     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 Aortic stenosis Mother         s/p AV valve replacement   ? Atrial fibrillation Mother         deceased from cardiosynch procedure   ? Breast cancer Mother 60   ? Alcohol abuse Mother    ? Arthritis Mother    ? Depression Mother    ? Hyperlipidemia Mother    ? Hypertension Mother    ? Stroke Mother    ? Other (myocardial infarction) Mother    ? Aortic stenosis Cousin         s/p AV valve replacement   ? Meniere's disease Other         niece   ? Colon cancer Maternal Uncle 45   ? Rectal cancer Sister 71   ? Depression Sister    ? Alcohol abuse Sister    ? Arthritis Sister    ? Atrial fibrillation Father    ? Heart failure Father    ? Melanoma Father    ? Alcohol abuse Father    ? Arthritis Father    ? Diabetes type II Father    ? Hyperlipidemia Father    ? Hypertension Father    ? Kidney disease Father    ? Learning disabilities Father    ? Stroke Father    ? Other (myocardial infarction) Father    ? Hypertension Brother    ? Learning disabilities Brother    ? Alcohol abuse Brother    ? Arthritis Brother    ? Arthritis Paternal Grandmother    ? Alcohol abuse Paternal Grandfather    ? Arthritis Paternal Grandfather    ? Alcohol abuse Sister    ? Arthritis Sister    ? Diabetes type II Brother    ? Arthritis Brother    ? Other (congenital cataract) Daughter    ? Club foot Niece    ? Other (aortic valve replaced) Maternal Cousin        There is no immunization history on file for this patient.    Review of Systems: A full 14-system review of systems was performed and negative except as noted per the HPI.         Objective:     Physical Examination:  BP 111/74  ~ Pulse 64  ~  Temp 36.5 ?C (97.7 ?F) (Tympanic)  ~ Resp 16  ~ Ht 5' 5'' (1.651 m)  ~ Wt 153 lb (69.4 kg)  ~ LMP 04/29/2019  ~ SpO2 100%  ~ BMI 25.46 kg/m?     General:   Alert,  in NAD  No CVAT   Abdomen - soft, no tenderness/mass.megaly/guarding    Lab at this visit:    Results for orders placed or performed in visit on 05/05/19   POCT urinalysis dipstick   Result Value Ref Range    Glucose Negative Negative    Bilirubin Negative Negative    Ketone Negative Negative    Specific Gravity 1.010 <1.005, 1.005, 1.010, 1.015, 1.020, 1.025, 1.030    Blood Moderate (++) (A) Negative    pH 5.5 <5.0, 5.0, 5.5, 6.0, 6.5, 7.0, 7.5, 8.0, 8.5    Protein Negative Negative    Urobilinogen 0.2 mg/dL 0.2 mg/dL, 1.0 mg/dL    Nitrite Negative Negative    Leukocyte Moderate (++) (A) Negative         Assessment and Plan:     Diagnoses and all orders for this visit:    Dysuria  - POCT urinalysis dipstick; Future  -     Bacterial Culture Urine; Future    Other orders  -     nitrofurantoin, macrocrystal monohydrate, (MACROBID) 100 mg capsule; Take 1 capsule (100 mg total) by mouth two (2) times daily for 7 days.  -     phenazopyridine 200 mg tablet; Take 1 tablet (200 mg total) by mouth three (3) times daily as needed (urinary pain).        Patient Instructions   Drink lots of liquids  Recheck/call in 48 hours if not ANY better and sooner if ANY worse  Start oral antibiotic today and take complete course  Phenazopyridine (pyridium) is for symptom relief ONLY, will not treat the infection.  FYI: will make your urine !  If a culture was sent we will share results with you in a few days if a change in therapy is needed.      The above diagnosis, orders, and follow-up were discussed with the patient. The patient had all questions answered satisfactorily and understands this recommended plan of care.    Huey Bienenstock, MD

## 2019-05-05 NOTE — Patient Instructions
Drink lots of liquids  Recheck/call in 48 hours if not ANY better and sooner if ANY worse  Start oral antibiotic today and take complete course  Phenazopyridine (pyridium) is for symptom relief ONLY, will not treat the infection.  FYI: will make your urine [place]!  If a culture was sent we will share results with you in a few days if a change in therapy is needed.

## 2019-05-07 LAB — Bacterial Culture Urine: BACTERIAL CULTURE URINE: 30000 — AB

## 2019-05-12 ENCOUNTER — Encounter: Payer: Self-pay | Admitting: Physician Assistant

## 2019-05-12 NOTE — Telephone Encounter (Signed)
Needs appt

## 2019-05-14 ENCOUNTER — Ambulatory Visit: Payer: BLUE CROSS/BLUE SHIELD

## 2019-05-14 DIAGNOSIS — Z20822 Encounter for screening laboratory testing for COVID-19 virus in asymptomatic patient: Secondary | ICD-10-CM

## 2019-05-14 NOTE — Progress Notes
PATIENT: Vanessa Terry  MRN: [id number]  DOB: 02-10-1971  DATE OF SERVICE: 05/14/2019    CHIEF COMPLAINT:   Chief Complaint   Patient presents with    ILI/PUI       Subjective:      Vanessa Terry is a 49 y.o. female.  Patient here with for covid test  Recently travelled back from [place]  Asymptomatic  Had covid in December, has since tested negative    No complaints    PCP: Vanessa Terry., DO   PCP Telephone: [phone]    Past Medical History:   Diagnosis Date    Anemia     Anxiety     Coccyx pain     Constipation     Depression     IBS (irritable bowel syndrome)     Incontinence in female     Tinnitus of left ear     Vertigo        Current Outpatient Medications   Medication Sig    PAROXETINE 10 mg tablet TAKE 1 TABLET BY MOUTH EVERY DAY     No current facility-administered medications for this visit.        Allergies   Allergen Reactions    Sulfamethoxazole-Trimethoprim      Eye swelling         There is no immunization history on file for this patient.    Patient's last menstrual period was 04/29/2019.      Review of Systems   14 point ROS obtained and negative except for what is stated above.       Objective:      Physical Exam     Alert, oriented, no distress  Well appearing  No resp distress  EOMI, Conj clear  Non focal neuro exam  Mood/affect normal    Order: covid swab obtained and pending      Assessment:       Screening for covid 19  Asymptomatic      Plan:       Await results  Return as needed          Author:  Constance Terry 05/14/2019 10:10 AM

## 2019-05-15 LAB — COVID-19 and Influenza A/B PCR: COVID-19 PCR: NOT DETECTED

## 2019-05-15 NOTE — Telephone Encounter (Signed)
Patient has appt 3/23

## 2019-05-16 ENCOUNTER — Other Ambulatory Visit: Payer: Self-pay

## 2019-05-16 ENCOUNTER — Ambulatory Visit (INDEPENDENT_AMBULATORY_CARE_PROVIDER_SITE_OTHER): Payer: PRIVATE HEALTH INSURANCE

## 2019-05-16 ENCOUNTER — Ambulatory Visit (INDEPENDENT_AMBULATORY_CARE_PROVIDER_SITE_OTHER): Payer: PRIVATE HEALTH INSURANCE | Admitting: Physician Assistant

## 2019-05-16 VITALS — BP 112/75 | HR 83 | Temp 98.6°F

## 2019-05-16 DIAGNOSIS — R3915 Urgency of urination: Secondary | ICD-10-CM

## 2019-05-16 DIAGNOSIS — M25512 Pain in left shoulder: Secondary | ICD-10-CM | POA: Diagnosis not present

## 2019-05-16 DIAGNOSIS — G9332 Myalgic encephalomyelitis/chronic fatigue syndrome: Secondary | ICD-10-CM

## 2019-05-16 DIAGNOSIS — M79645 Pain in left finger(s): Secondary | ICD-10-CM | POA: Diagnosis not present

## 2019-05-16 DIAGNOSIS — R300 Dysuria: Secondary | ICD-10-CM | POA: Diagnosis not present

## 2019-05-16 DIAGNOSIS — G8929 Other chronic pain: Secondary | ICD-10-CM

## 2019-05-16 DIAGNOSIS — N898 Other specified noninflammatory disorders of vagina: Secondary | ICD-10-CM

## 2019-05-16 DIAGNOSIS — G894 Chronic pain syndrome: Secondary | ICD-10-CM

## 2019-05-16 DIAGNOSIS — M797 Fibromyalgia: Secondary | ICD-10-CM

## 2019-05-16 DIAGNOSIS — L4050 Arthropathic psoriasis, unspecified: Secondary | ICD-10-CM

## 2019-05-16 DIAGNOSIS — R5382 Chronic fatigue, unspecified: Secondary | ICD-10-CM

## 2019-05-16 DIAGNOSIS — N3090 Cystitis, unspecified without hematuria: Secondary | ICD-10-CM

## 2019-05-16 DIAGNOSIS — R29898 Other symptoms and signs involving the musculoskeletal system: Secondary | ICD-10-CM

## 2019-05-16 DIAGNOSIS — F5101 Primary insomnia: Secondary | ICD-10-CM

## 2019-05-16 LAB — POCT URINALYSIS DIP (CLINITEK)
Bilirubin, UA: NEGATIVE
Blood, UA: NEGATIVE
Glucose, UA: NEGATIVE mg/dL
Ketones, POC UA: NEGATIVE mg/dL
Nitrite, UA: NEGATIVE
POC PROTEIN,UA: NEGATIVE
Spec Grav, UA: 1.02 (ref 1.010–1.025)
Urobilinogen, UA: 0.2 E.U./dL
pH, UA: 7 (ref 5.0–8.0)

## 2019-05-16 MED ORDER — AMPHETAMINE-DEXTROAMPHET ER 30 MG PO CP24: 30.0000 mg | ORAL_CAPSULE | ORAL | 0 refills | Status: DC

## 2019-05-16 MED ORDER — ZOLPIDEM TARTRATE 5 MG PO TABS: 5.0000 mg | ORAL_TABLET | Freq: Every day | ORAL | 5 refills | Status: DC

## 2019-05-16 MED ORDER — HYDROCODONE-ACETAMINOPHEN 5-325 MG PO TABS: ORAL_TABLET | ORAL | 0 refills | Status: DC

## 2019-05-16 MED ORDER — PHENAZOPYRIDINE HCL 200 MG PO TABS: 200.0000 mg | ORAL_TABLET | Freq: Three times a day (TID) | ORAL | 0 refills | Status: AC

## 2019-05-16 NOTE — Patient Instructions (Signed)
Will refer to PT and pain clinic.  Will call with urine culture.  Start pyridium.

## 2019-05-16 NOTE — Progress Notes (Signed)
Call pt: wet prep was negative for yeast, BV, trichomonas.

## 2019-05-16 NOTE — Progress Notes (Signed)
Xray of left shoulder looks good. No significant arthritis. Could be more bursitis. Follow up with Dr. Karie Schwalbe to consider injection.

## 2019-05-16 NOTE — Progress Notes (Signed)
Subjective:    Patient ID: Sarah Mayer, female    DOB: [birth year], 49 y.o.   MRN: [id number]  HPI  Pt is a 49 yo female who presents to the clinic with her husband to discuss multiple concerns. She has an extensive pmh.   .. Active Ambulatory Problems    Diagnosis Date Noted  . Psoriatic arthritis (HCC) 03/24/2016  . Gastroesophageal reflux disease 03/24/2016  . GAD (generalized anxiety disorder) 03/24/2016  . Iron deficiency anemia 03/24/2016  . History of pulmonary embolism 03/27/2016  . History of DVT (deep vein thrombosis) 03/27/2016  . Chronic tension-type headache, not intractable 03/27/2016  . Primary insomnia 03/27/2016  . Bilateral lower extremity edema 03/27/2016  . Chronic pain syndrome 05/25/2016  . Morbid (severe) obesity due to excess calories (HCC) 05/27/2016  . Serum calcium elevated 08/30/2016  . Diarrhea 08/30/2016  . Collagenous colitis 10/18/2016  . Barrett's esophagus without dysplasia 10/18/2016  . Arthralgia 10/18/2016  . Memory changes 11/23/2016  . Major depression, recurrent, chronic (HCC) 11/23/2016  . OSA (obstructive sleep apnea) 11/23/2016  . Twitching 12/22/2016  . Upper airway cough syndrome 12/22/2016  . Bilateral hip pain 12/22/2016  . Tripping over things 01/12/2017  . Left arm pain 01/12/2017  . Left elbow pain 01/12/2017  . Numbness and tingling in left arm 01/17/2017  . Word finding difficulty 03/15/2017  . Intractable migraine without aura and with status migrainosus 03/15/2017  . Edema of both feet 07/04/2017  . B12 deficiency 07/04/2017  . Acquired hypothyroidism 07/04/2017  . Vitamin D deficiency 07/04/2017  . Secondary hyperparathyroidism, non-renal (HCC) 07/04/2017  . Psoriasis 07/06/2017  . CFS (chronic fatigue syndrome) 07/06/2017  . Easy bruising 07/26/2017  . Myofascial pain 08/19/2017  . Itching 08/19/2017  . Yeast infection 08/19/2017  . IBS (irritable bowel syndrome) 12/03/2017  . Recurrent falls 12/29/2017   . Weakness 02/11/2018  . 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 Urinary frequency 02/14/2018  . Chronic left shoulder pain 03/15/2018  . Chronic pain of left thumb 03/15/2018  . 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 Jerking 09/12/2018  . Episode of confusion 09/12/2018  . Right leg pain 11/14/2018  . Elevated serum creatinine 11/14/2018  . Dry mouth 11/14/2018  . Bilateral leg weakness 01/23/2019  . Skin infection 01/23/2019  . Chronic nonintractable headache 02/07/2019  . 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   Resolved Ambulatory Problems    Diagnosis Date Noted  . Hyperparathyroidism, primary (HCC) 09/18/2016  . Rash and nonspecific skin eruption 10/18/2016  . Acute bronchitis 02/07/2018   Past Medical History:  Diagnosis Date  . Microscopic colitis   . Migraine   . Morbid obesity (HCC) 05/27/2016   Pts main concern is urinary symptoms of dysuria, frequency and urgency. She denies any flank pain, fever, chills, nausea or vomiting. She has noticed for at least 3 weeks. She is also having some vaginal itching as well.   Seeing rheumatology of psoriatic arthritis. Started infusion of Taltz. Will go for next infusion in 8 weeks. She has noticed some mild joint stiffness improvement. Her left shoulder has not been better. Lots of pain and decrease in ROM.   She wonders about the test for fibromyalgia. Her friend went to a ketamine clinic and got a lot of fibromyalgia relief. She wonders about referral.   She is doing some better on neurotic scratching. She takes zyprexa and hydroxyzine.  She needs refills on adderall, norco.   She is ready to start PT back to get more strength. Needs pool due to  not being able to ambulate.        Review of Systems See HPI.     Objective:   Physical Exam Vitals reviewed.  Constitutional:      Appearance: She is obese.  HENT:     Head: Normocephalic.  Cardiovascular:     Rate and Rhythm: Normal rate and regular rhythm.  Pulmonary:     Effort:  Pulmonary effort is normal.     Breath sounds: Normal breath sounds.  Abdominal:     General: Bowel sounds are normal. There is no distension.     Palpations: Abdomen is soft.     Tenderness: There is no abdominal tenderness. There is no right CVA tenderness, left CVA tenderness, guarding or rebound.  Skin:    Comments: Bilateral legs and arms mostly scabbed excoriations at this point. Very few open sores.   Neurological:     General: No focal deficit present.     Mental Status: She is alert and oriented to person, place, and time.  Psychiatric:        Mood and Affect: Mood normal.           Assessment & Plan:  Marland KitchenMarland KitchenJoselynn was seen today for urinary tract infection and pain.  Diagnoses and all orders for this visit:  Dysuria -     POCT URINALYSIS DIP ([place]INITEK) -     Urine Culture -     phenazopyridine (PYRIDIUM) 200 MG tablet; Take 1 tablet (200 mg total) by mouth 3 (three) times daily for 2 days.  Itching in the vaginal area -     WET PREP FOR TRICH, YEAST, CLUE  Vaginal discharge -     WET PREP FOR [place], YEAST, CLUE  Urgency of urination -     POCT URINALYSIS DIP (CLINITEK) -     Urine Culture  Chronic pain of left thumb -     Pain Mgmt, Profile 6 Conf w/o mM, U -     HYDROcodone-acetaminophen (NORCO/VICODIN) 5-325 MG tablet; Take 1-2 tablets as needed at bedtime for moderate to severe pain.  Psoriatic arthritis ([place]) -     Pain Mgmt, Profile 6 Conf w/o mM, U -     HYDROcodone-acetaminophen (NORCO/VICODIN) 5-325 MG tablet; Take 1-2 tablets as needed at bedtime for moderate to severe pain. -     Ambulatory referral to Pain Clinic  Cystitis  Chronic left shoulder pain -     DG Shoulder Left  Primary insomnia -     zolpidem (AMBIEN) 5 MG tablet; Take 1 tablet (5 mg total) by mouth at bedtime.  CFS (chronic fatigue syndrome) -     amphetamine-dextroamphetamine (ADDERALL XR) 30 MG 24 hr capsule; Take 1 capsule (30 mg total) by mouth every morning. -      amphetamine-dextroamphetamine (ADDERALL XR) 30 MG 24 hr capsule; Take 1 capsule (30 mg total) by mouth every morning. -     amphetamine-dextroamphetamine (ADDERALL XR) 30 MG 24 hr capsule; Take 1 capsule (30 mg total) by mouth every morning. -     Ambulatory referral to Physical Therapy  Fibromyalgia -     Ambulatory referral to Pain Clinic -     Ambulatory referral to Physical Therapy  Chronic pain syndrome -     Ambulatory referral to Pain Clinic -     Ambulatory referral to Physical Therapy  Morbidly obese [facility]) -     Ambulatory referral to Physical Therapy  Bilateral leg weakness -     Ambulatory referral to  Physical Therapy   UA positive leukocytes but no blood or nitrates. Will culture.  Sent pyridium to start.  Wet prep sent off.   No recent xray of left shoulder. Looking for left shoulder arthritis. She could benefit from seeing sports medicine as well. Cannot take NSAIDs. Use voltaren gel.   Marland KitchenMarland KitchenPDMP reviewed during this encounter. Pain contract filled out today.  norco refilled.  Referral for ketamine clinic due to pain.  PT for pool therapy.   Refilled adderall for 3 months.   Continue zyprexa at 5mg  dose for another 2-3 weeks will consider increase as needed.   Addendum: Culture positive for E.coli.  Treated with macrobid.   Left shoulder xray-no significant arthritis. Consider appt with sports medicine.

## 2019-05-17 ENCOUNTER — Other Ambulatory Visit: Payer: Self-pay | Admitting: Physician Assistant

## 2019-05-17 DIAGNOSIS — F339 Major depressive disorder, recurrent, unspecified: Secondary | ICD-10-CM

## 2019-05-18 ENCOUNTER — Other Ambulatory Visit: Payer: Self-pay | Admitting: Neurology

## 2019-05-18 DIAGNOSIS — L981 Factitial dermatitis: Secondary | ICD-10-CM

## 2019-05-18 MED ORDER — OLANZAPINE 5 MG PO TABS: 5.0000 mg | ORAL_TABLET | Freq: Every day | ORAL | 0 refills | Status: DC

## 2019-05-19 ENCOUNTER — Other Ambulatory Visit: Payer: Self-pay | Admitting: Physician Assistant

## 2019-05-19 LAB — PAIN MGMT, PROFILE 6 CONF W/O MM, U
6 Acetylmorphine: NEGATIVE ng/mL
Alcohol Metabolites: NEGATIVE ng/mL (ref <500)
Amobarbital: NEGATIVE ng/mL
Amphetamine: 1033 ng/mL
Amphetamines: POSITIVE ng/mL
Barbiturates: POSITIVE ng/mL
Benzodiazepines: NEGATIVE ng/mL
Butalbital: 621 ng/mL
Cocaine Metabolite: NEGATIVE ng/mL
Codeine: NEGATIVE ng/mL
Creatinine: 65.3 mg/dL
Hydrocodone: 1444 ng/mL
Hydromorphone: 68 ng/mL
Marijuana Metabolite: NEGATIVE ng/mL
Methadone Metabolite: NEGATIVE ng/mL
Methamphetamine: NEGATIVE ng/mL
Morphine: NEGATIVE ng/mL
Norhydrocodone: 958 ng/mL
Opiates: POSITIVE ng/mL
Oxidant: NEGATIVE ug/mL
Oxycodone: NEGATIVE ng/mL
Pentobarbital: NEGATIVE ng/mL
Phencyclidine: NEGATIVE ng/mL
Phenobarbital: NEGATIVE ng/mL
Secobarbital: NEGATIVE ng/mL
pH: 7.4 (ref 4.5–9.0)

## 2019-05-19 LAB — URINE CULTURE
MICRO NUMBER:: 10282344
SPECIMEN QUALITY:: ADEQUATE

## 2019-05-19 LAB — WET PREP FOR TRICH, YEAST, CLUE
MICRO NUMBER:: 10282045
Specimen Quality: ADEQUATE

## 2019-05-19 MED ORDER — NITROFURANTOIN MONOHYD MACRO 100 MG PO CAPS: 100.0000 mg | ORAL_CAPSULE | Freq: Two times a day (BID) | ORAL | 0 refills | Status: DC

## 2019-05-19 NOTE — Progress Notes (Signed)
You do have UTI. Ecoli found in urine. Sent macrobid to pharmacy for 7 days.

## 2019-05-22 ENCOUNTER — Encounter: Payer: Self-pay | Admitting: Physician Assistant

## 2019-05-22 NOTE — Telephone Encounter (Signed)
Pt questioned if [facility] has the new fibromyalgia test [facility]? Can we find out?

## 2019-05-25 ENCOUNTER — Encounter: Payer: Self-pay | Admitting: Physician Assistant

## 2019-05-30 ENCOUNTER — Encounter: Payer: Self-pay | Admitting: Physician Assistant

## 2019-05-30 MED ORDER — DOXYCYCLINE HYCLATE 100 MG PO TABS: 100.0000 mg | ORAL_TABLET | Freq: Two times a day (BID) | ORAL | 0 refills | Status: DC

## 2019-06-02 MED ORDER — PHENAZOPYRIDINE HCL 200 MG PO TABS: 200.0000 mg | ORAL_TABLET | Freq: Three times a day (TID) | ORAL | 0 refills | Status: AC

## 2019-06-02 NOTE — Addendum Note (Signed)
Addended by: Jomarie Longs on: 06/02/2019 04:33 PM   Modules accepted: Orders

## 2019-06-06 ENCOUNTER — Other Ambulatory Visit: Payer: Self-pay

## 2019-06-06 ENCOUNTER — Other Ambulatory Visit: Payer: Self-pay | Admitting: Neurology

## 2019-06-06 ENCOUNTER — Other Ambulatory Visit: Payer: Self-pay | Admitting: Physician Assistant

## 2019-06-06 DIAGNOSIS — L981 Factitial dermatitis: Secondary | ICD-10-CM

## 2019-06-06 MED ORDER — OLANZAPINE 5 MG PO TABS: 5.0000 mg | ORAL_TABLET | Freq: Every day | ORAL | 0 refills | Status: DC

## 2019-06-06 MED ORDER — BUTALBITAL-APAP-CAFF-COD 50-325-40-30 MG PO CAPS: ORAL_CAPSULE | ORAL | 1 refills | Status: DC

## 2019-06-06 MED ORDER — OLANZAPINE 10 MG PO TABS: 10.0000 mg | ORAL_TABLET | Freq: Every day | ORAL | 0 refills | Status: DC

## 2019-06-06 NOTE — Addendum Note (Signed)
Addended by: Jomarie Longs on: 06/06/2019 12:32 PM   Modules accepted: Orders

## 2019-06-06 NOTE — Telephone Encounter (Signed)
Routing to provider. Med refill for zyprexa sent to the pharmacy. Pt is requesting a change in dose for medication.

## 2019-06-06 NOTE — Progress Notes (Signed)
Last filled 02/23/2019 #30 with one refill.  Last appt 05/16/2019

## 2019-06-06 NOTE — Progress Notes (Signed)
Sent!

## 2019-06-07 ENCOUNTER — Encounter: Payer: Self-pay | Admitting: Physician Assistant

## 2019-06-07 DIAGNOSIS — L981 Factitial dermatitis: Secondary | ICD-10-CM

## 2019-06-07 DIAGNOSIS — F419 Anxiety disorder, unspecified: Secondary | ICD-10-CM

## 2019-06-07 DIAGNOSIS — F333 Major depressive disorder, recurrent, severe with psychotic symptoms: Secondary | ICD-10-CM

## 2019-06-07 DIAGNOSIS — R4189 Other symptoms and signs involving cognitive functions and awareness: Secondary | ICD-10-CM

## 2019-06-07 NOTE — Telephone Encounter (Signed)
Routing to provider  

## 2019-06-12 ENCOUNTER — Encounter: Payer: Self-pay | Admitting: Physician Assistant

## 2019-06-12 DIAGNOSIS — G9332 Myalgic encephalomyelitis/chronic fatigue syndrome: Secondary | ICD-10-CM

## 2019-06-12 DIAGNOSIS — R5382 Chronic fatigue, unspecified: Secondary | ICD-10-CM

## 2019-06-12 MED ORDER — PAROXETINE HCL 10 MG PO TABS
ORAL_TABLET | 2 refills | Status: AC
Start: 2019-06-12 — End: ?

## 2019-06-12 MED ORDER — AMPHETAMINE-DEXTROAMPHET ER 30 MG PO CP24: 30.0000 mg | ORAL_CAPSULE | ORAL | 0 refills | Status: DC

## 2019-06-12 NOTE — Telephone Encounter (Signed)
LMOM to cancel RX at [facility].

## 2019-06-12 NOTE — Telephone Encounter (Signed)
Sent adderall as directed to [facility]. Can we make sure canceled at [facility].

## 2019-06-13 ENCOUNTER — Ambulatory Visit: Payer: BLUE CROSS/BLUE SHIELD

## 2019-06-13 ENCOUNTER — Ambulatory Visit: Payer: Commercial Managed Care - PPO

## 2019-06-13 ENCOUNTER — Telehealth: Payer: BLUE CROSS/BLUE SHIELD

## 2019-06-13 ENCOUNTER — Inpatient Hospital Stay: Payer: BLUE CROSS/BLUE SHIELD | Attending: Orthopaedic Trauma

## 2019-06-13 ENCOUNTER — Institutional Professional Consult (permissible substitution): Payer: BLUE CROSS/BLUE SHIELD

## 2019-06-13 ENCOUNTER — Ambulatory Visit: Payer: Commercial Managed Care - PPO | Attending: Orthopaedic Trauma

## 2019-06-13 ENCOUNTER — Institutional Professional Consult (permissible substitution): Payer: Commercial Managed Care - PPO

## 2019-06-13 DIAGNOSIS — L814 Other melanin hyperpigmentation: Secondary | ICD-10-CM

## 2019-06-13 DIAGNOSIS — D1801 Hemangioma of skin and subcutaneous tissue: Secondary | ICD-10-CM

## 2019-06-13 DIAGNOSIS — Z01818 Encounter for other preprocedural examination: Secondary | ICD-10-CM

## 2019-06-13 DIAGNOSIS — L821 Other seborrheic keratosis: Secondary | ICD-10-CM

## 2019-06-13 DIAGNOSIS — S42201A Unspecified fracture of upper end of right humerus, initial encounter for closed fracture: Secondary | ICD-10-CM

## 2019-06-13 DIAGNOSIS — Z719 Counseling, unspecified: Secondary | ICD-10-CM

## 2019-06-13 DIAGNOSIS — S025XXB Fracture of tooth (traumatic), initial encounter for open fracture: Secondary | ICD-10-CM

## 2019-06-13 DIAGNOSIS — D229 Melanocytic nevi, unspecified: Secondary | ICD-10-CM

## 2019-06-13 DIAGNOSIS — L570 Actinic keratosis: Secondary | ICD-10-CM

## 2019-06-13 MED ORDER — AMOXICILLIN 500 MG PO CAPS
500 mg | ORAL_CAPSULE | Freq: Three times a day (TID) | ORAL | 0 refills | Status: AC
Start: 2019-06-13 — End: 2019-06-21

## 2019-06-13 NOTE — Telephone Encounter
Reply by: Raiana Pharris E. Milanes-Skopp  Thank you.

## 2019-06-13 NOTE — Telephone Encounter
Cory Roughen from Dr. Sampson Goon scheduled patient for 06/16/19 for cardiac clearance - it's only a placeholder as he knows that patient needs to be seen right away within the next 24 hours. I let him know that patient broke her arm and needs cardiac clearance. He is going to send an urgent message to Dr. Sampson Goon and see if they can get the patient in sooner due to stat situation.     Please be advised

## 2019-06-13 NOTE — Progress Notes
Attending Outpatient Initial Visit [facility]    PCP:  Brandy Hale., DO  CC:   Chief Complaint   Patient presents with   ??? Lesion     nose     HPI:  F/u   Just had R arm fx, tooth avulsion  C/o mult moles  C/o mult  red and  brown leisons  C/o mult rough lesions - nose    Location: head trunk extr  Severity: mod  Quality: persistent  Mod Factors: worse in sun  Symptoms: occ itch     Past Hx:  Medical Hx:  Personal Hx of Skin Dz:  Comments:    Skin Ca (self) []  Pos [x]  Neg    Melanoma []  Pos [x]  Neg    Other []  Pos [x]  Neg             Family Hx of Skin Dz:  Comments:    Skin Ca (Family) []  Pos [x]  Neg      Other []  Pos [x]  Neg        ROS:  System:  Comments:    Constitutional []  Pos [x]  Neg     Ear/Nose/Mouth/Throat []  Pos [x]  Neg     Endocrine []  Pos [x]  Neg     Muskuloskeletal []  Pos [x]  Neg     Skin o/w negative []  Pos [x]  Neg     Other ROS []  Pos [x]  Neg         Vital Signs:  There were no vitals filed for this visit.    PHYSICAL EXAM SKIN:   Comments:   Appearance [x]  Normal  []  Abnormal    Head and Face  []  Normal  [x]  Abnormal    Neck [x]  Normal  []  Abnormal    Chest, Breasts, Axilla []  Normal  []  Abnormal    Abdomen []  Normal  []  Abnormal    Back []  Normal  []  Abnormal    RUE [x]  Normal  []  Abnormal    LUE [x]  Normal  []  Abnormal     RLE [x]  Normal  []  Abnormal      LLE []  Normal  []  Abnormal    Genitalia, groin, buttocks []  Normal  []  Abnormal    Palpate scalp; inspect hair [x]  Normal  []  Abnormal     Inspect ecc/apo glands [x]  Normal  []  Abnormal     Other:  []  Normal  []  Abnormal       PHYSICAL EXAM OTHER:   Comments:   Eyes (inspect conj/lids) [x]  Normal  []  Abnormal     Lymph (palpate LN) []  Normal  []  Abnormal     Neuro/Psych (OX3, Mood) [x]  Normal  []  Abnormal     ENMT (Insp oropharynx) [x]  Normal  []  Abnormal        All areas examined were within normal limits.  There were scattered tan reticulate and oval even  macules, verrucous papules, and small red vascular papules.    KOH: Comments:   Labs/Orders []  Yes []  No    Lab Report Reviewed []  Yes []  No    Old Records Ordered []  Yes []  No    Old Recoreds Review []  Yes []  No        Assessment and Plan:  Diagnoses and all orders for this visit:    Multiple benign nevi    Seborrheic keratosis    Cherry angioma    Lentigo    Actinic keratosis          Counseling  re:  Wynelle Link Protection [x]  Yes []  No   Skin Self-exam [x]  Yes []  No   Signs of Skin Cancer  [x]  Yes []  No   Discussion of Dx/Prog/Tx Options [x]  Yes []  No     Other []  Yes []  No         Procecdure (if applicable) Comments:   []   Biopsy    [x]   Cryotherapy R nasal dorsal sidewall 5 mm red tender dyskeratotic patch   []   Other (please explain)      Consent:  [x]  Yes []  No  Verbal consent was obtained for the procedure(s).  The risks, benefits, and alternatives to therapy were discussed.  Risks include bleeding, infection, scars, pigment change, redness, and recurrence.  Wound care instructions were given.      No follow-ups on file.    Vic Ripper. Orion Modest, MD 06/14/2019 11:21 AM

## 2019-06-13 NOTE — Consults
[facility] of Orthopaedic Surgery  06/13/2019    PATIENT: Vanessa Terry  MRN: [id number]  DOB: 10-08-1970  DATE OF SERVICE: 06/13/2019    REFERRING PRACTITIONER: Donley Redder., MD  PRIMARY CARE PROVIDER: Brandy Hale., DO    REASON FOR REFERRAL: right humerus fracture     Chief Complaint:   Chief Complaint   Patient presents with   ??? New Consult     Right humerus fx     Subjective:       Vanessa Terry is a 49 y.o. year-old female referred for evaluation and treatment of right humerus fracture.  The mechanism of injury was slip and fall on 06/09/2019 at 10:30am.  The patient is active in skiing.  Treatment to date has included sling, seen in the ER on 4/16 at [facility].   She also lost her two front teeth.   Treatment has provided relief of these symptoms.    PAST MEDICAL HISTORY:  Past Medical History:   Diagnosis Date   ??? Anemia    ??? Anxiety    ??? Coccyx pain    ??? Constipation    ??? Depression    ??? IBS (irritable bowel syndrome)    ??? Incontinence in female    ??? Tinnitus of left ear    ??? Vertigo      Past Surgical History:   Procedure Laterality Date   ??? CESAREAN SECTION  2014   ??? KNEE SURGERY  1994    in [place], arthroscopic   ??? LAPAROSCOPY DIAGNOSTIC / BIOPSY / ASPIRATION / LYSIS  2011   ??? [facility]OMY  2012    [facility]   ??? OVARY SURGERY  2011   ??? REMOVAL OF R OVARIAN CYST  2011    [facility]      MEDICATIONS   Outpatient Medications Prior to Visit   Medication Sig   ??? PAROXETINE 10 mg tablet TAKE 1 TABLET BY MOUTH EVERY DAY     No facility-administered medications prior to visit.        ALLERGIES  Allergies   Allergen Reactions   ??? Sulfamethoxazole-Trimethoprim      Eye swelling       SOCIAL HISTORY  Social History     Socioeconomic History   ??? Marital status: Married     Spouse name: Not on file   ??? Number of children: Not on file   ??? Years of education: Not on file   ??? Highest education level: Not on file   Occupational History   ??? Not on file   Social Needs   ??? Financial resource strain: Not on file ??? Food insecurity     Worry: Not on file     Inability: Not on file   ??? Transportation needs     Medical: Not on file     Non-medical: Not on file   Tobacco Use   ??? Smoking status: Former Smoker   ??? Smokeless tobacco: Never Used   ??? Tobacco comment: quit 10 yrs ago, smoked socially x15 yrs   Substance and Sexual Activity   ??? Alcohol use: No     Alcohol/week: 0.0 oz   ??? Drug use: No   ??? Sexual activity: Yes     Partners: Male     Birth control/protection: None   Lifestyle   ??? Physical activity     Days per week: 2 days     Minutes per session: 30 min   ??? Stress: Rather much  Relationships   ??? Social Wellsite geologist on phone: Not on file     Gets together: Not on file     Attends religious service: Not on file     Active member of club or organization: Not on file     Attends meetings of clubs or organizations: Not on file     Relationship status: Not on file   Other Topics Concern   ??? Not on file   Social History Narrative    Home: Lives in [place] with husband and 2 kids (twins); originally from [place], then [place] x15 yrs moved here in 2010; recent changes: no changes other than potentially moving dad to come live with her (in [place]) as she is DPOA and managing his health care, going back and forth to [place] to are for dad;  living situation and social supports: good    Family and Relationships: sister lives in [place], one in [place], 2 brothers in [place], dad is in [place] as above. Husband and kids as above.     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 Aortic stenosis Mother         s/p AV valve replacement   ??? Atrial fibrillation Mother         deceased from cardiosynch procedure   ??? Breast cancer Mother 31   ??? Alcohol abuse Mother    ??? Arthritis Mother    ??? Depression Mother    ??? Hyperlipidemia Mother    ??? Hypertension Mother    ??? Stroke Mother    ??? Other (myocardial infarction) Mother    ??? Aortic stenosis Cousin         s/p AV valve replacement   ??? Meniere's disease Other         niece   ??? Colon cancer Maternal Uncle 45   ??? Rectal cancer Sister 62   ??? Depression Sister    ??? Alcohol abuse Sister    ??? Arthritis Sister    ??? Atrial fibrillation Father    ??? Heart failure Father    ??? Melanoma Father    ??? Alcohol abuse Father    ??? Arthritis Father    ??? Diabetes type II Father    ??? Hyperlipidemia Father    ??? Hypertension Father    ??? Kidney disease Father    ??? Learning disabilities Father    ??? Stroke Father    ??? Other (myocardial infarction) Father    ??? Hypertension Brother    ??? Learning disabilities Brother    ??? Alcohol abuse Brother    ??? Arthritis Brother    ??? Arthritis Paternal Grandmother    ??? Alcohol abuse Paternal Grandfather    ??? Arthritis Paternal Grandfather    ??? Alcohol abuse Sister    ??? Arthritis Sister    ??? Diabetes type II Brother    ??? Arthritis Brother    ??? Other (congenital cataract) Daughter    ??? Club foot Niece    ??? Other (aortic valve replaced) Maternal Cousin        REVIEW OF SYSTEMS:  A 14-system review was performed and is negative except for: ringing in the ears, constipation, frequent urination, nervousness, easy bruising, anemia  Objective:       PHYSICAL EXAM:  Ht 5' 4'' (1.626 m)  ~ Wt 140 lb (63.5 kg)  ~ BMI 24.03 kg/m???     comfortable, well-appearing, no apparent distress, alert and oriented  RUE  AIN/PIN/U motor intact  SILT m/u/r, slightly muted in m  wwp    Radiology:   Spiral proximal humeral shaft fracture with large butterfly fragment with displacement, approximately 2cm shortening     Assessment:     Impression: right closed proximal humerus fracture    Plan/ Recommendation:     Treatment: we discussed both non-operative and operative treatment options. With non-operative treatment, this will likely heal with some stiffness and possible rotational malalignment. Operative management would entail open reduction and internal fixation in the anatomic position to fix the current malalignment. This will allow for early motion since the fracture will be stabilized. With non-operative treatment, there is also a possible need for internal fixation at a later time if there is no bony healing, though this would be unlikely.   With operative management, there are risks of surgery, specifically injury to the radial nerve at it is at the fracture site. Other risks include infection, bleeding, stiffness, hardware complications, damage of neurovascular structures, malunion, nonunion, complications from anesthesia.     The diagnosis and treatment options were discussed and reviewed in detail with the patient today who communicates understanding with the plan.     Patient was seen and discussed with Dr Laural Benes    Larena Glassman, MD  Orthopaedic Surgery, PGY-5  [phone]

## 2019-06-13 NOTE — Progress Notes
Patient: Vanessa Terry  DOB: 1970-04-26  MRN: [id number]  Date: 06/13/2019       Chief Complaint:   Chief Complaint   Patient presents with   ??? Pre-op Exam     per pt here for pre-op exam for rt arm surgery.       Subjective   Pt is a 49yo female that is here for preop clearance.  Pt slipped and fell in her home.  She slipped on the comforter from her bed.  Chipped her front teeth and her right humerus is fractured.  Pt was seen at [facility]. Pt will be having her teeth repaired tomorrow. Rx for tooth -Amoxicillin discussed with pt and sent to her pharmacy. Pt will let her oral surgeon know that is repairing her teeth tomorrow of the rx for amoxicillin      Pt does skiing (last went in March 2021) and jogs approx once to twice a week two miles approx.  METS above 4.     Pt denies any cardiac symptoms. No other symptoms after complete ROS.          PMHX, PSHX, Meds, and Allergies were all reviewed today.     No additional contributory past medical, surgical, family or social history except as documented in care connect.   Complete 14 pointReview of Systems - ROS negative except as documented above     Past Medical History:   Diagnosis Date   ??? Anemia    ??? Anxiety    ??? Coccyx pain    ??? Constipation    ??? Depression    ??? IBS (irritable bowel syndrome)    ??? Incontinence in female    ??? Tinnitus of left ear    ??? Vertigo      Patient Active Problem List   Diagnosis   ??? Tinnitus of left ear   ??? Vertigo   ??? Coccyx pain   ??? Toe pain, left   ??? History of in vitro fertilization   ??? Abscess   ??? Hyperlipidemia, unspecified   ??? Mood changes   ??? Family history of colorectal cancer   ??? Family history of colon cancer requiring screening colonoscopy   ??? Depression   ??? Incontinence in female       Meds:  Outpatient Medications Prior to Visit   Medication Sig   ??? PAROXETINE 10 mg tablet TAKE 1 TABLET BY MOUTH EVERY DAY     No facility-administered medications prior to visit.        Allergies   Allergen Reactions   ??? Sulfamethoxazole-Trimethoprim      Eye swelling            Objective   Review of Systems   ROS Normal unless checked below:    Check if Abnormal  Note Positive Findings    []  Constit      []  Eyes      []  ENT      []  Thyroid     []  Resp      []  Imm/All      []  CV      []  GI      []  GU      []  Endo      [x]  MS      []  Skin      []  Heme/Lymph      []  Neuro      []  Psych     []  Other       Vitals: BP 107/73  ~  Pulse 61  ~ Temp 37.4 ???C (99.3 ???F) (Tympanic)  ~ Resp 14  ~ Ht 5' 5'' (1.651 m)  ~ LMP 05/28/2019  ~ SpO2 100%  ~ BMI 23.30 kg/m???    Gen: NAD  Head: Normocephalic  EENT: Eyes: PERRLA, EOMI is normal. Ears: Pt has cerumen partially obscuring her TM's bilaterally.  External ears are normal.  Throat: Buccal mucosa, and gums are normal.  Chipped tooth incisor in the front.  Healed cut on the upper inside of the lip.  Pharynx is normal without erythema. Nose: no discharge. No thyroidmegaly. No carotid bruit.  No LN.   Lungs: CTA B/L No R/R/W  Heart: S1S2 RRR, no murmurs  Abdomen: soft, NTND, no masses, BS present, No R/R/G  Ext: swelling/bruising over the humerus of the right arm. Some dependent swelling of the hand on the right. Pt has normal capillary refill. Pt moves all fingers of the right hand.  Left arm is normal with full ROM. Legs: normal bilaterally.  Skin: Pt has bruising on her left arm. AK on nose that was treated by derm.   Neuro: Alert and oriented.  CNII-XII intact. KJ +2 bilaterally.    Xray: Viewed xrays pt had on her phone of right arm.  Pt has humerus fracture.      EKG : NSR with HR of 65. Pt has short PR interval and Twave in V2.  As interpreted by myself. D/W cardiologist Dr. Norva Karvonen informally her EKG.     Echo adult transthoracic complete  Status: Final result   Study Result            [facility]   [address] [place], [address]           [place], [place] [address]            Phone: [phone]             Fax: [phone]     TRANSTHORACIC ECHOCARDIOGRAM REPORT     Patient Name:        Vanessa Terry        Date of Exam:   12/20/2018  Medical Rec #:       [id number]                 Accession #:    [id number]  Date of Birth:       [birth year]               Height:         63 in  Age:                 48 years                Weight:         145 lbs  Gender:              F                       BSA:            1.69 m???  Referring Physician: [id number] Faylene Million Blood Pressure: 106/75 mmHg  Diagnosis: R07.89-CP     MEASUREMENTS:  LVIDd (2D)     5.17 cm LVIDs (2D)   3.44 cm  IVSd (2D)      0.88 cm LVPWd (2D)   0.67 cm  LA (2D)        3.30 cm Ao Root (2D) 2.70 cm  FINDINGS:  Left Ventricle: The left ventricular size is normal. Left ventricular wall thickness is normal. Normal LV regional wall motion. Left ventricular ejection fraction is approximately 60 to 65%. The ejection fraction by Simpson's Biplane method is 63 %.   Normal LV diastolic function. The left ventricle stroke volume by Doppler method is 69.1 ml (index 41.0 ml/m???). MV deceleration time is 299 msec.  MV E velocity is 0.78 m/s. MV A velocity is 0.58 m/s. E/A ratio is 1.34.  Lateral e' velocity is 15.2 cm/s. Medial e' velocity is 11.7 cm/s.  Lateral E/e' ratio is 5.1. Medial E/e' ratio is 6.6. Averaged E/e' ratio is 5.8.  Left Atrium: The left atrium is normal in size. The LA volume (Biplane method) is 38.6 ml. The LA Volume index is 22.9 ml/m???.  Right Atrium: The right atrium is normal in size. RA area is 12.9 cm2. RA volume is 28 ml.  Right Ventricle: The right ventricular size is normal. Global RV systolic function is normal. TAPSE is 19.1 mm. The RV free wall tissue Doppler S' wave measures 14.00 cm/s. The right ventricle basal diameter measures 32 mm. The right ventricle mid cavity   measures 34 mm. The right ventricle longitudinal diameter measures 72 mm.  Mitral Valve: The mitral valve appears thickened. Trace mitral valve regurgitation.  Aortic Valve: The aortic valve appears trileaflet. No evidence of aortic valve regurgitation. The LVOT velocity is 0.95 m/s. The peak aortic valve velocity is 1.29 m/s. The peak instantaneous pressure gradient is 6.7 mmHg; mean pressure gradient is 3.0   mmHg; LVOT diameter is 2.00 cm. By the continuity equation, the calculated aortic valve area by Vmax method is 2.32 cm??? (index 1.38 cm???/m???) and by VTI method is 2.43 cm??? (index 1.44 cm???/m???). No aortic valve stenosis.  Tricuspid Valve: The tricuspid valve appears normal in structure. Trivial tricuspid regurgitation is present. The peak velocity of TR is 1.51 m/s.  Pulmonic Valve: The pulmonic valve was not well visualized. Trace pulmonary valve regurgitation. No evidence of pulmonary valve stenosis.  Pericardium: There is no pericardial effusion.  Aorta: The aortic root size is normal. The sinus of Valsalva measures 27 mm. The proximal ascending aorta measures 27 mm.  Pulmonary Artery: The peak TR velocity is not well defined, and therefore, pulmonary artery systolic pressure cannot be calculated. Based on the acceleration time in the RV outflow tract, the PA pressure is not likely to be elevated.  IVC: The IVC measures at 2.00 cm. Normal inferior vena cava in diameter. There is greater than 50% collapse of the IVC during respiration. Normal right atrial pressure.     CONCLUSIONS   1. Normal left ventricular size with normal wall motion.   2. Normal left ventricular ejection fraction of approximately 60 to 65%. The calculated ejection fraction (Simpson's) is 63 %.   3. Normal LV diastolic function.   4. Normal right ventricle in size. Normal RV systolic function.   5. There is no significant valvular dysfunction.   6. There are no prior studies on this patient for comparison purposes.  [id number] Bokhoor MD  Electronically signed by [id number] Francisca December MD on 12/22/2018 at 7:04:05 PM     Sonographer: Janann Colonel      Final          PH-9 TOTAL SCORE [place] 12/04/2014 04/22/2018 11/15/2018   Total Score 12 4 3    Some encounter information is confidential and restricted. Go to Review Flowsheets activity to see all data.  Labs/Studies:  Lab Results   Component Value Date    NA 141 08/18/2018    K 4.7 08/18/2018    CL 105 08/18/2018    CO2 21 08/18/2018    BUN 15 08/18/2018    CALCIUM 9.6 08/18/2018    ALKPHOS 42 08/18/2018    AST 23 08/18/2018    ALT 13 08/18/2018    BILITOT 1.1 08/18/2018    GLUCOSE 80 08/18/2018     Lab Results   Component Value Date    WBC 6.29 04/29/2018    HGB 13.1 04/29/2018    HCT 41.2 04/29/2018    PLT 333 04/29/2018    RBC 4.30 04/29/2018    MCV 95.8 04/29/2018         Assessment /Plan     Diagnoses and all orders for this visit:    Preop examination    Preop testing  -     Cancel: ECG 12[facility] Performed  -     ECG 12[facility] Performed  -     EKG REPORT  -     APTT  -     Prothrombin Time Panel  -     Comprehensive Metabolic Panel  -     CBC & Auto Differential    Closed fracture of proximal end of right humerus, unspecified fracture morphology, initial encounter    Open fracture of tooth, initial encounter  -     amoxicillin 500 mg capsule; Take 1 capsule (500 mg total) by mouth three (3) times daily for 7 days.    RX amox for her teeth sent to the pharmacy. D/w pt rx.   Pt has above 4 METS for cardiac activity. ECHO done 12/22/2019 and report above.   Pt is low risk for surgery. D/W pt as well about her cardiac findings and clearance.  Pt made aware that no surgery is free of risk- cardiac, anesthesia, bleeding, infection and etc.   Pt should have anesthesia evaluate pt prior to surgery.     Will await labs.  If labs are ok and anesthesia eval ok, then pt may proceed with surgery.     Brandy Hale, DO         The above plan of care, diagnosis, orders, and follow-up were discussed with the patient.  Questions related to this recommended plan of care were answered.    Addendum:     06/14/2019  9:01am    Pt has decrease of hemoglobin / hematocrit 10.3 / 32.01 from previous 13.1 / 42.6 on 04/29/2018.  Surgeon to discuss with pt consent for possible transfusion if it should be needed during surgery.      Brandy Hale, DO

## 2019-06-13 NOTE — Telephone Encounter
Patient called requesting a preop apt for this afternoon. Advised patient Dr.Milanes-Skopp has availability however I would need patient's preop paperwork before scheduling. Patient informed me that she broke her arm on Friday and is scheduled to have surgery this Thursday.    I explained to patient I cannot schedule her without the preop orders. Patient was upset but said she would contact Dr.Johnson's office then call us back to schedule.

## 2019-06-14 ENCOUNTER — Ambulatory Visit: Payer: BLUE CROSS/BLUE SHIELD | Attending: Orthopaedic Trauma

## 2019-06-14 ENCOUNTER — Ambulatory Visit: Payer: BLUE CROSS/BLUE SHIELD

## 2019-06-14 ENCOUNTER — Ambulatory Visit: Payer: Commercial Managed Care - PPO

## 2019-06-14 DIAGNOSIS — S42361A Displaced segmental fracture of shaft of humerus, right arm, initial encounter for closed fracture: Secondary | ICD-10-CM

## 2019-06-14 LAB — Prothrombin Time Panel: PROTHROMBIN TIME: 12.6 s (ref 11.5–14.4)

## 2019-06-14 LAB — Comprehensive Metabolic Panel
ALANINE AMINOTRANSFERASE: 22 U/L (ref 8–64)
ALBUMIN: 4.2 g/dL (ref 3.9–5.0)

## 2019-06-14 LAB — Differential Automated: MONOCYTE PERCENT, AUTO: 7 (ref 0.00–0.04)

## 2019-06-14 LAB — APTT: APTT: 28.9 s (ref 24.4–36.2)

## 2019-06-14 LAB — COVID-19 PCR

## 2019-06-14 LAB — CBC: MEAN CORPUSCULAR VOLUME: 95.8 fL (ref 79.3–98.6)

## 2019-06-15 ENCOUNTER — Ambulatory Visit: Payer: BLUE CROSS/BLUE SHIELD

## 2019-06-15 ENCOUNTER — Inpatient Hospital Stay
Admit: 2019-06-15 | Discharge: 2019-06-16 | Disposition: A | Discharge status: Home / Self Care | Payer: BLUE CROSS/BLUE SHIELD | Source: Home / Self Care | Attending: Orthopaedic Trauma

## 2019-06-15 DIAGNOSIS — H9312 Tinnitus, left ear: Secondary | ICD-10-CM

## 2019-06-15 DIAGNOSIS — S42361A Displaced segmental fracture of shaft of humerus, right arm, initial encounter for closed fracture: Secondary | ICD-10-CM

## 2019-06-15 MED ADMIN — DEXAMETHASONE SODIUM PHOSPHATE 4 MG/ML IJ SOLN: INTRAVENOUS | Stop: 2019-06-16 | NDC 67457042312

## 2019-06-15 MED ADMIN — LIDOCAINE HCL (CARDIAC) 100 MG/5ML IV SOSY: INTRAVENOUS | Stop: 2019-06-16 | NDC 76329339001

## 2019-06-15 MED ADMIN — CEFAZOLIN SODIUM 1 G IJ SOLR: Stop: 2019-06-16 | NDC 00143992490

## 2019-06-15 MED ADMIN — ACETAMINOPHEN 500 MG PO TABS: 1000 mg | ORAL | @ 22:00:00 | Stop: 2019-06-15 | NDC 00904673061

## 2019-06-15 MED ADMIN — PLASMA-LYTE A IV SOLN: INTRAVENOUS | Stop: 2019-06-16 | NDC 00338022104

## 2019-06-15 MED ADMIN — PROPOFOL 200 MG/20ML IV EMUL: Stop: 2019-06-16 | NDC 63323026929

## 2019-06-15 MED ADMIN — FENTANYL CITRATE (PF) 100 MCG/2ML IJ SOLN: INTRAVENOUS | Stop: 2019-06-16 | NDC 00409909422

## 2019-06-15 MED ADMIN — FAMOTIDINE 20 MG/2ML IV SOLN: INTRAVENOUS | Stop: 2019-06-16 | NDC 67457043322

## 2019-06-15 MED ADMIN — ROCURONIUM BROMIDE 50 MG/5ML IV SOLN: INTRAVENOUS | Stop: 2019-06-16 | NDC 39822420002

## 2019-06-15 MED ADMIN — PHENYLEPHRINE HCL 10 MG/ML IV SOLN: INTRAVENOUS | Stop: 2019-06-16 | NDC 00641614225

## 2019-06-15 MED ADMIN — HYDROMORPHONE HCL 1 MG/ML IJ SOLN: INTRAVENOUS | Stop: 2019-06-16 | NDC 00409128331

## 2019-06-15 MED ADMIN — PHENYLEPHRINE HCL 10 MG/ML IV SOLN: INTRAVENOUS | Stop: 2019-06-16

## 2019-06-15 NOTE — H&P
PRE OP HISTORY AND PHYSICAL    PATIENT: Vanessa Terry  MEDICAL RECORD NUMBER: [id number]  DATE OF BIRTH: 06/14/1970    PROVIDER: Donley Redder., MD  DATE OF SERVICE: 06/15/2019    SUBJECTIVE     HISTORY OF PRESENT ILLNESS:  Vanessa Terry is a 49 y.o. female referred for evaluation and treatment of right humerus fracture.  The mechanism of injury was slip and fall on 06/09/2019 at 10:30am.  The patient is active in skiing.  Treatment to date has included sling, seen in the ER on 4/16 at [facility].   She also lost her two front teeth.   Treatment has provided relief of these symptoms.    PAST MEDICAL HISTORY:  Past Medical History:   Diagnosis Date   ??? AK (actinic keratosis)    ??? Anemia    ??? Anxiety    ??? Coccyx pain    ??? Constipation    ??? Depression    ??? IBS (irritable bowel syndrome)    ??? Incontinence in female    ??? Tinnitus of left ear    ??? Vertigo        PAST SURGICAL HISTORY:  Past Surgical History:   Procedure Laterality Date   ??? CESAREAN SECTION  2014   ??? KNEE SURGERY  1994    in [place], arthroscopic   ??? LAPAROSCOPY DIAGNOSTIC / BIOPSY / ASPIRATION / LYSIS  2011   ??? [facility]OMY  2012    [facility]   ??? OVARY SURGERY  2011   ??? REMOVAL OF R OVARIAN CYST  2011    [facility]        FAMILY HISTORY:  Family History   Problem Relation Age of Onset   ??? Aortic stenosis Mother         s/p AV valve replacement   ??? Atrial fibrillation Mother         deceased from cardiosynch procedure   ??? Breast cancer Mother 21   ??? Alcohol abuse Mother    ??? Arthritis Mother    ??? Depression Mother    ??? Hyperlipidemia Mother    ??? Hypertension Mother    ??? Stroke Mother    ??? Skin cancer Mother    ??? Other (myocardial infarction) Mother    ??? Aortic stenosis Cousin         s/p AV valve replacement   ??? Meniere's disease Other         niece   ??? Colon cancer Maternal Uncle 45   ??? Rectal cancer Sister 54   ??? Depression Sister    ??? Alcohol abuse Sister    ??? Arthritis Sister    ??? Atrial fibrillation Father    ??? Heart failure Father    ??? Melanoma Father ??? Alcohol abuse Father    ??? Arthritis Father    ??? Diabetes type II Father    ??? Hyperlipidemia Father    ??? Hypertension Father    ??? Kidney disease Father    ??? Learning disabilities Father    ??? Stroke Father    ??? Other (myocardial infarction) Father    ??? Hypertension Brother    ??? Learning disabilities Brother    ??? Alcohol abuse Brother    ??? Arthritis Brother    ??? Arthritis Paternal Grandmother    ??? Alcohol abuse Paternal Grandfather    ??? Arthritis Paternal Grandfather    ??? Alcohol abuse Sister    ??? Arthritis Sister    ??? Diabetes type  II Brother    ??? Arthritis Brother    ??? Other (congenital cataract) Daughter    ??? Club foot Niece    ??? Other (aortic valve replaced) Maternal Cousin        SOCIAL HISTORY:  Social History     Socioeconomic History   ??? Marital status: Married     Spouse name: Not on file   ??? Number of children: Not on file   ??? Years of education: Not on file   ??? Highest education level: Not on file   Occupational History   ??? Not on file   Social Needs   ??? Financial resource strain: Not on file   ??? Food insecurity     Worry: Not on file     Inability: Not on file   ??? Transportation needs     Medical: Not on file     Non-medical: Not on file   Tobacco Use   ??? Smoking status: Former Smoker   ??? Smokeless tobacco: Never Used   ??? Tobacco comment: quit 10 yrs ago, smoked socially x15 yrs   Substance and Sexual Activity   ??? Alcohol use: No     Alcohol/week: 0.0 oz   ??? Drug use: No   ??? Sexual activity: Yes     Partners: Male     Birth control/protection: None   Lifestyle   ??? Physical activity     Days per week: 2 days     Minutes per session: 30 min   ??? Stress: Rather much   Relationships   ??? Social Wellsite geologist on phone: Not on file     Gets together: Not on file     Attends religious service: Not on file     Active member of club or organization: Not on file     Attends meetings of clubs or organizations: Not on file     Relationship status: Not on file   Other Topics Concern   ??? Not on file   Social History Narrative    Home: Lives in [place] with husband and 2 kids (twins); originally from [place], then [place] x15 yrs moved here in 2010; recent changes: no changes other than potentially moving dad to come live with her (in [place]) as she is DPOA and managing his health care, going back and forth to [place] to are for dad;  living situation and social supports: good    Family and Relationships: sister lives in [place], one in [place], 2 brothers in [place], dad is in [place] as above. Husband and kids as above.     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        ALLERGIES:  Allergies   Allergen Reactions   ??? Sulfamethoxazole-Trimethoprim      Eye swelling       REVIEW OF SYSTEMS:  A 14 point review of systems is negative except as noted in HPI.    OBJECTIVE     PHYSICAL EXAM:  Last Recorded Vital Signs:    06/15/19 1005   BP: 108/65   Pulse: 61   Resp: 16   Temp: 36.7 ???C (98.1 ???F)   SpO2: 99%     General: AAOx4, NAD  Resp: Even/unlabored  CV: RRR HDS  RUE  AIN/PIN/U motor intact  SILT m/u/r, slightly muted in m  wwp    LABS/MICRO:  Lab Results   Component Value Date    WBC 7.55 06/13/2019    HGB 10.3 (L) 06/13/2019    HCT 32.1 (L) 06/13/2019    MCV 95.8 06/13/2019    PLT 254 06/13/2019     Lab Results   Component Value Date    CREAT 0.63 06/13/2019    BUN 14 06/13/2019    NA 139 06/13/2019    K 4.1 06/13/2019    CL 104 06/13/2019    CO2 23 06/13/2019           ASSESSMENT/PLAN     Vanessa Terry is a 49 y.o. female will proceed with surgery as planned.     The procedure, complications, and anticipated hospital course were discussed with the patient by the attending surgeon.      The rationale, alternatives, risks and benefits were presented. In addition to the risks of surgery which include bleeding, infection, damage to surrounding nerves/vessels/soft tissue/bone, need for re-operation, stiffness, reduced range of motion, pain, scarring, dissatisfaction with surgery, need for blood transfusion, compartment syndrome, clot, pulmonary embolism, limb-length discrepancy, implant failure, instability, dislocation, and death. Additionally the risks of anesthesia which include heart, pulmonary, DVT, positioning complications, etc. were discussed. Time was allowed for the patient to ask questions and all of the patient's questions were answered. The patient understood these risks and elected to proceed with surgery given the stated benefits.        Informed consent has been signed.      Signed: Mariane Duval, MD   Orthopaedic Surgery    06/15/2019 10:42 AM

## 2019-06-15 NOTE — H&P
UPDATED H&P REQUIREMENT    For [facility] and [facility]    WHAT IS THE STATUS OF THE PATIENT'S MOST CURRENT HISTORY AND PHYSICAL?   - The most current H&P was performed within the past 24 hours. No additional updated H&P documentation is necessary.     REFER TO MEDICAL STAFF POLICIES REGARDING PRE-PROCEDURE HISTORY AND PHYSICAL EXAMINATION AND UPDATED H&P REQUIREMENTS BELOW:    [facility] and [facility] Medical Staff Policy 200 - For Patients Undergoing Procedures Requiring Moderate or Deep Sedation, General Anesthesia or Regional Anesthesia    Contents of a History and Physical Examination (H&P):    The H&P shall consist of chief complaint, history of present illness, allergies and medications, relevant social and family history, past medical history, review of systems and physical examination, and assessment and plan appropriate to the patient's age.    For Patients Undergoing Procedures Requiring Moderate or Deep Sedation, General Anesthesia or Regional Anesthesia:    1. An H&P shall be performed within 24 hours prior to the procedure by a qualified member of the medical staff or designee with appropriate privileges, except as noted in item 2 below.    2. If a complete history and physical was performed within thirty (30) calendar days prior to the patient's admission to the Medical Center for elective surgery, a member of the medical staff assumes the responsibility for the accuracy of the clinical information and will need to document in the medical record within twenty-four (24) hours of admission and prior to surgery or major invasive procedure, that they either attest that the history and physical has been reviewed and accepted, or document an update of the original history and physical relevant to the patient's current clinical status.    3. Providing an H&P for  patients undergoing surgery under local anesthesia is at the discretion of the Attending Physician.     4. When a procedure is performed by a dentist, podiatrist or other practitioner who is not privileged to perform an H&P, the anesthesiologist's assessment immediately prior to the procedure will constitute the 24 hour re-assessment.The dentist, podiatrist or other practitioner who is not privileged to perform an H&P will document the history and physical relevant to the procedure.    5. If the H&P and the written informed consent for the surgery or procedure are not recorded in the patient's medical record prior to surgery, the operation shall not be performed unless the attending physician states in writing that such a delay could lead to an adverse event or irreversible damage to the patient.    6. The above requirements shall not preclude the rendering of emergency medical or surgical care to a patient in dire circumstances.

## 2019-06-16 ENCOUNTER — Ambulatory Visit: Payer: BLUE CROSS/BLUE SHIELD

## 2019-06-16 ENCOUNTER — Encounter: Payer: Self-pay | Admitting: Physician Assistant

## 2019-06-16 DIAGNOSIS — R519 Acute nonintractable headache, unspecified headache type: Secondary | ICD-10-CM

## 2019-06-16 DIAGNOSIS — D649 Anemia, unspecified: Secondary | ICD-10-CM

## 2019-06-16 DIAGNOSIS — S42351A Displaced comminuted fracture of shaft of humerus, right arm, initial encounter for closed fracture: Secondary | ICD-10-CM

## 2019-06-16 DIAGNOSIS — R42 Dizziness and giddiness: Secondary | ICD-10-CM

## 2019-06-16 LAB — Differential Automated: ABSOLUTE BASO COUNT: 0.01 10*3/uL (ref 0.00–0.10)

## 2019-06-16 LAB — CBC: HEMOGLOBIN: 8.4 g/dL — ABNORMAL LOW (ref 11.6–15.2)

## 2019-06-16 LAB — Hematocrit: HEMATOCRIT: 25.5 — ABNORMAL LOW (ref 34.9–45.2)

## 2019-06-16 LAB — Hemoglobin: HEMOGLOBIN: 8.4 g/dL — ABNORMAL LOW (ref 11.6–15.2)

## 2019-06-16 MED ORDER — PROCHLORPERAZINE MALEATE 10 MG PO TABS
10 mg | ORAL_TABLET | Freq: Four times a day (QID) | ORAL | 0 refills | 10.00 days | Status: AC | PRN
Start: 2019-06-16 — End: ?

## 2019-06-16 MED ORDER — ACETAMINOPHEN 325 MG PO TABS
ORAL_TABLET | 0 refills | Status: AC
Start: 2019-06-16 — End: 2019-06-17

## 2019-06-16 MED ORDER — PROCHLORPERAZINE MALEATE 10 MG PO TABS
10 mg | ORAL_TABLET | Freq: Four times a day (QID) | ORAL | 0 refills | Status: AC | PRN
Start: 2019-06-16 — End: 2019-06-17
  Filled 2019-06-17: qty 40, 10d supply, fill #0

## 2019-06-16 MED ORDER — OXYCODONE HCL 5 MG PO TABS
5 mg | ORAL_TABLET | ORAL | 0 refills | 7.00 days | Status: AC | PRN
Start: 2019-06-16 — End: 2019-06-19
  Filled 2019-06-17: qty 40, 7d supply, fill #0

## 2019-06-16 MED ORDER — ACETAMINOPHEN 325 MG PO TABS
ORAL_TABLET | 0 refills | 10.00 days | Status: AC
Start: 2019-06-16 — End: ?

## 2019-06-16 MED ORDER — OXYCODONE HCL 5 MG PO TABS
5 mg | ORAL_TABLET | ORAL | 0 refills | PRN
Start: 2019-06-16 — End: 2019-06-17
  Filled 2019-06-17: qty 120, 10d supply, fill #0

## 2019-06-16 MED ORDER — OXYCODONE HCL 5 MG PO TABS
5 mg | ORAL_TABLET | ORAL | 0 refills | PRN
Start: 2019-06-16 — End: 2019-06-17

## 2019-06-16 MED ORDER — BISACODYL 5 MG PO TBEC
5 mg | ORAL_TABLET | Freq: Every day | ORAL | 0 refills | Status: AC | PRN
Start: 2019-06-16 — End: 2019-06-17
  Filled 2019-06-17: qty 30, 30d supply, fill #0

## 2019-06-16 MED ORDER — BISACODYL 5 MG PO TBEC
5 mg | ORAL_TABLET | Freq: Every day | ORAL | 0 refills | 30.00 days | Status: AC | PRN
Start: 2019-06-16 — End: ?

## 2019-06-16 MED ADMIN — KETOROLAC TROMETHAMINE 30 MG/ML IJ SOLN: @ 03:00:00 | Stop: 2019-06-16 | NDC 72611072225

## 2019-06-16 MED ADMIN — HYDROMORPHONE HCL 1 MG/ML IJ SOLN: .2 mg | INTRAVENOUS | @ 05:00:00 | Stop: 2019-06-16 | NDC 00409128303

## 2019-06-16 MED ADMIN — PROPOFOL 200 MG/20ML IV EMUL: @ 03:00:00 | Stop: 2019-06-16 | NDC 63323026929

## 2019-06-16 MED ADMIN — FENTANYL CITRATE (PF) 100 MCG/2ML IJ SOLN: 25 ug | INTRAVENOUS | @ 04:00:00 | Stop: 2019-06-16

## 2019-06-16 MED ADMIN — OXYCODONE HCL 5 MG PO TABS: 5 mg | ORAL | @ 11:00:00 | Stop: 2019-06-17 | NDC 00406055223

## 2019-06-16 MED ADMIN — AMOXICILLIN 500 MG PO CAPS: 500 mg | ORAL | @ 13:00:00 | Stop: 2019-06-17 | NDC 00781261301

## 2019-06-16 MED ADMIN — ACETAMINOPHEN 325 MG PO TABS: 650 mg | ORAL | @ 09:00:00 | Stop: 2019-06-17 | NDC 50580045811

## 2019-06-16 MED ADMIN — FENTANYL CITRATE (PF) 100 MCG/2ML IJ SOLN: INTRAVENOUS | @ 03:00:00 | Stop: 2019-06-16 | NDC 00409909422

## 2019-06-16 MED ADMIN — DOCUSATE SODIUM 100 MG PO CAPS: 100 mg | ORAL | @ 06:00:00 | Stop: 2019-06-17 | NDC 00904645561

## 2019-06-16 MED ADMIN — ACETAMINOPHEN 325 MG PO TABS: 650 mg | ORAL | @ 16:00:00 | Stop: 2019-06-17 | NDC 50580045811

## 2019-06-16 MED ADMIN — CEFAZOLIN SODIUM-DEXTROSE 1-4 GM/50ML-% IV SOLN: 1 g | INTRAVENOUS | @ 16:00:00 | Stop: 2019-06-16 | NDC 00338350341

## 2019-06-16 MED ADMIN — ACETAMINOPHEN 325 MG PO TABS: 650 mg | ORAL | @ 23:00:00 | Stop: 2019-06-17 | NDC 50580045811

## 2019-06-16 MED ADMIN — AMOXICILLIN 500 MG PO CAPS: 500 mg | ORAL | @ 20:00:00 | Stop: 2019-06-17 | NDC 00781261301

## 2019-06-16 MED ADMIN — OXYCODONE HCL 5 MG PO TABS: 10 mg | ORAL | @ 06:00:00 | Stop: 2019-06-23 | NDC 00406055223

## 2019-06-16 MED ADMIN — SUGAMMADEX SODIUM 200 MG/2ML IV SOLN: @ 02:00:00 | Stop: 2019-06-16 | NDC 00006542312

## 2019-06-16 MED ADMIN — AMOXICILLIN 500 MG PO CAPS: 500 mg | ORAL | @ 06:00:00 | Stop: 2019-06-17 | NDC 00781261301

## 2019-06-16 MED ADMIN — DOCUSATE SODIUM 100 MG PO CAPS: 100 mg | ORAL | @ 16:00:00 | Stop: 2019-06-17 | NDC 00904645561

## 2019-06-16 MED ADMIN — ACETAMINOPHEN 10 MG/ML IV SOLN: 1000 mg | INTRAVENOUS | @ 04:00:00 | Stop: 2019-06-16 | NDC 63323043441

## 2019-06-16 MED ADMIN — VANCOMYCIN HCL 1000 MG TOPICAL: @ 02:00:00 | Stop: 2019-06-16

## 2019-06-16 MED ADMIN — CEFAZOLIN SODIUM-DEXTROSE 1-4 GM/50ML-% IV SOLN: 1 g | INTRAVENOUS | @ 07:00:00 | Stop: 2019-06-16 | NDC 00338350341

## 2019-06-16 MED ADMIN — DOUBLE ANTIBIOTIC IRRIGATION: 500 mL | @ 02:00:00 | Stop: 2019-06-16 | NDC 70594004902

## 2019-06-16 MED ADMIN — DROPERIDOL 2.5 MG/ML IJ SOLN: @ 02:00:00 | Stop: 2019-06-16 | NDC 00517970225

## 2019-06-16 MED ADMIN — PAROXETINE HCL 10 MG PO TABS: 10 mg | ORAL | @ 07:00:00 | Stop: 2019-06-17 | NDC 68084004411

## 2019-06-16 MED ADMIN — HYDROMORPHONE HCL 1 MG/ML IJ SOLN: INTRAVENOUS | @ 02:00:00 | Stop: 2019-06-16 | NDC 00409128331

## 2019-06-16 MED ADMIN — FENTANYL CITRATE (PF) 100 MCG/2ML IJ SOLN: 25 ug | INTRAVENOUS | @ 03:00:00 | Stop: 2019-06-16 | NDC 00409909412

## 2019-06-16 MED ADMIN — ONDANSETRON HCL 4 MG/2ML IJ SOLN: INTRAVENOUS | @ 02:00:00 | Stop: 2019-06-16 | NDC 60505613005

## 2019-06-16 MED ADMIN — SENNOSIDES 8.6 MG PO TABS: 1 | ORAL | @ 06:00:00 | Stop: 2019-06-17 | NDC 00904652261

## 2019-06-16 MED ADMIN — ROCURONIUM BROMIDE 50 MG/5ML IV SOLN: INTRAVENOUS | @ 01:00:00 | Stop: 2019-06-16 | NDC 39822420002

## 2019-06-16 NOTE — Progress Notes
Pharmaceutical Services  Discharge Medication Reconciliation and Counseling Note    Patient Name: Vanessa Terry  Medical Record Number: [id number]  Admit date:        Age: 49 y.o.  Sex: female  Allergies: Sulfamethoxazole-trimethoprim    Preferred Pharmacy:   [id number] - [place], [place] - [address] [place] [address]  [place] [phone]        I reconciled the discharge medications and counseled the patient on the relevant discharge medications. Discharge prescriptions were delivered to bedside. The purpose, potential side effects, storage, missed doses and any special instructions related to each medication was discussed.    Discharge Medication List from AVS:     Changes To My Medications      Start taking these medications      Dose Instructions   prochlorperazine 10 mg tablet  Commonly known as: Compazine   10 mg   Take 1 tablet (10 mg total) by mouth every six (6) hours as needed for Nausea or Vomiting.              Prescriptions      These medications were sent to [facility] ([phone])  [address], [place] [address]    Hours: Mon-Fri 8AM-9PM, Sat 8AM-7PM, Sun/Holidays 8AM-5PM (Closed 1PM-2PM for lunch) Phone: [phone]    acetaminophen 325 mg tablet  declined. Medication was NOT dispensed as per request.    bisacodyl 5 mg EC tablet  declined. Medication was NOT dispensed as per request.     oxyCODONE 5 mg tablet  declined. Medication was NOT dispensed as per request.    prochlorperazine 10 mg tablet          Isaiah Serge, 06/16/2019, 4:13 PM   X 1/12.58 CC

## 2019-06-16 NOTE — Consults
FINAL DISCHARGE MULTIDISCIPLINARY NOTE  Department of Care Coordination      Admit UEAV:[id number]  Anticipated Date of Discharge: 06/16/2019    Following BJ:YNWGNFA, Roddie Mc., MD    Home Address:[address]  [place] [address]      DISCHARGE INFORMATION:     Discharge Address: [address] [place] [address]    Individual(s) notified of discharge plan:  Contact Name: Courtney Relationship: Self   Contact Number(s): [phone]      Is patient/family informed of discharge?: Yes Is patient/family agreeable of discharge destination?: Yes     Support Systems: Spouse       Medicare Important Message Provided: Not Applicable      Final Discharge Needs: Home Durable Medical and/or Respiratory Therapy Equipment     Home Durable Medical and/or Respiratory Equipment (if applicable):   Equipment Provided: Single point cane per PT recommendations  Delivery Arrangements: to bedside    Homeless Discharge (if applicable):   Current Living Situation: Lives w/Capable Spouse    Transportation Arrangements (if applicable):   By family    Other Arrangements (if applicable):   Home health requested via AIDIN.  ** Nursing eval. and Physical therapy.    [facility]  --- no available staff    Please check AIDIN. Referral sent to 13 home health providers.      Lennox Pippins. Goro Wenrick, BSN, RN   Ortho/Vascular Surgery Services Case Manager  [facility]  Department of Care Coordination and Clinical Social Work  Phone: [phone]/ Pager: [phone]  Fax:   [phone]  E-mail: [email or web address]    06/16/2019

## 2019-06-16 NOTE — Discharge Summary
Date of service: 06/16/2019    Attending: Laural Benes  Service: Orthopaedic Surgery    Date of admission: 06/15/19    Date of discharge: 06/16/2019    Admitting diagnosis: R humerus fracture  Discharge diagnosis: same    Operations: Procedure(s) (LRB):  ORIF RIGHT PROXIMAL HUMERUS (Right)    Brief history:  Pt is a 49 y.o. female who presented for scheduled operative fixation of her right humerus fracture.    Hospital course:  The patient was admitted to the Orthopedic Surgery service for scheduled surgery.  The patient was brought to the operating room, underwent an uncomplicated surgery as listed above, was brought to the recovery room in stable condition and transferred to the floor when fully recovered from anesthesia.  The patient's pain was controlled with an oral pain regimen, home diet was resumed and well tolerated, physical therapy was consulted and left their recommendations, and on 06/16/2019, the patient was stable for discharge with close outpatient follow up with orthopaedic surgery.      The patient did not require pharmacologic anticoagulation.    PT recommended d/c to home with home health.    At the time of discharge, VS stable, stable H/H, NVI, cleared by PT/medicine/ortho for discharge.    Consults:  -Case management  -Physical therapy  -Occupational therapy    Discharge examination:  General: Well appearing, NAD, interactive and alert    Extremities:    right UPPER EXTREMITY:   Neuro: SILT m/u/r distributions, +EPL/APL/FPL/FDS/FDP/EDC/IO, + flex/ex wrist, elbow, + flex/abduction/adduction of shoulder  Vascular: 2+ radial pulse, warm and well perfused  Skin: dressings c/d/i. Drain pulled. Sling in place  Compartments: Compartments soft and compressible    Discharge medications:     Medication List      START taking these medications    bisacodyl 5 mg EC tablet  Commonly known as: Dulcolax  Take 1 tablet (5 mg total) by mouth daily as needed for Constipation.     oxyCODONE 5 mg tablet  Take 1 tablet (5 mg total) by mouth every four (4) hours as needed for pain     prochlorperazine 10 mg tablet  Commonly known as: Compazine  Take 1 tablet (10 mg total) by mouth every six (6) hours as needed for Nausea or Vomiting.        CHANGE how you take these medications    acetaminophen 325 mg tablet  Commonly known as: Tylenol  Take 1-2 tablets by mouth every 4 hours as needed for pain.  What changed:   ??? how much to take  ??? how to take this  ??? when to take this  ??? reasons to take this  ??? additional instructions        CONTINUE taking these medications    amoxicillin 500 mg capsule  Take 1 capsule (500 mg total) by mouth three (3) times daily for 7 days.     PARoxetine 10 mg tablet  Commonly known as: Paxil  TAKE 1 TABLET BY MOUTH EVERY DAY           Where to Get Your Medications      These medications were sent to [facility] [phone])  [address], [place] [address]    Hours: Mon-Fri 8AM-9PM, Sat 8AM-7PM, Sun/Holidays 8AM-5PM (Closed 1PM-2PM for lunch) Phone: [phone]   ??? acetaminophen 325 mg tablet  ??? bisacodyl 5 mg EC tablet  ??? oxyCODONE 5 mg tablet  ??? prochlorperazine 10 mg tablet  Condition on discharge:  Stable    Discharge instructions:  - Weight bearing: do not do any lifting with your right hand. It is OK to hold your phone in your right hand, but do not hold objects any heavier than a phone in that hand.  - You may come out of your sling for gentle range of motion at the elbow and shoulder.  - Take all medications as directed  - Keep dressings clean and dry, do not remove until follow up appointment  - May shower with dressings covered, if able to keep dressings and incision completely dry, otherwise sponge bathe  - Call clinic or come to ED if you experience high fever, chills, redness, increased swelling or discharge from wound, increasing pain not alleviated by elevation and pain medication, new onset numbness, tingling or weakness    Please call [phone] for a follow up appointment with Dr. Laural Benes 2 weeks after discharge.     The following appointments are currently scheduled  Future Appointments   Date Time Provider Department Center   06/27/2019 10:55 AM Donley Redder., MD [facility]       The discharge plan was discussed with the patient who voiced a clear understanding and agreed to follow up as stated above.

## 2019-06-16 NOTE — Consults
Occupational Therapy Evaluation      PATIENT: Vanessa Terry  MRN: [id number]  DOB: December 04, 1970    ADMIT DATE:        Date of Evaluation: 06/16/2019    Problems: Active Problems:    Closed displaced comminuted fracture of shaft of right humerus POA: Yes       Past Medical History:   Diagnosis Date   ??? AK (actinic keratosis)    ??? Anemia    ??? Anxiety    ??? Coccyx pain    ??? Constipation    ??? Depression    ??? IBS (irritable bowel syndrome)    ??? Incontinence in female    ??? Tinnitus of left ear    ??? Vertigo     Past Surgical History:   Procedure Laterality Date   ??? CESAREAN SECTION  2014   ??? KNEE SURGERY  1994    in [place], arthroscopic   ??? LAPAROSCOPY DIAGNOSTIC / BIOPSY / ASPIRATION / LYSIS  2011   ??? [facility]Y  2012    [facility]   ??? OVARY SURGERY  2011   ??? [facility]OVAL OF R OVARIAN CYST  2011    [facility]         Relevant Hospital Course: Pt 49 yo F s/p R proximal humerus fx on 4/16 after slip and fall, now s/p ORIF of R humerus on 06/15/19.     Patient Stated Goal:  To go home.     Living Arrangements   Type of Home: Mobile home  Home Layout: One level, Stairs to enter with rails  # Stairs to enter: 3  Bathroom Shower/Tub: Pension scheme manager: Midwife: None  Home Equipment: None    Prior Level of Function   Level of Independence: Independent  Lives With: Spouse  Support Available: Family  # of hours available: husband available to assist as needed  ADL Assistance: Independent  Homemaking Assistance: Independent  Vision: Within Functional Limits  Hearing: Within Functional Limits    Precautions   Precautions: Monitor Vitals  Orthotic: Arm sling;Right  Current Activity Order: Order implies OOB(''ok for gentle ROM shoulder, elbow, wrist, fingers'')  Weight Bearing Status: Non Weight Bearing;Right Upper Extremity      GENERAL EVALUATION   Patient Presentation: In bed;Drain;HLIV(Davol drain)    Bed Mobility   Supine to Sit: Stand by Assist;to Right  Sit to Supine: Stand by Assist;to Left    Functional Transfers   Sit to Stand: Not Performed(due to nausea)    Activities of Daily Living (ADLs)   UB Dressing: Performed(educated on dressing technique (ie don RUE first), pt verbalized understanding)  UB Dressing Assistance: Minimum Assist  UB Dressing Deficit: Pull down in back;Pull around back;Fasteners;Thread RUE  UB Dressing Where Assessed: Edge of bed;Bed level      RUE Assessment   RUE Assessment: Exceptions to [facility]        RUE Overall Strength: Due to precautions     LUE Assessment   LUE Assessment: Within Functional Limits              Hand Function   Gross Grasp: Functional  Coordination: Functional      Sensation   Sensation: Grossly intact    Cognition   Cognition: Within Defined Limits  Safety Awareness: Good awareness of safety precautions  Barriers to Learning: None    Vision   Current Vision: No visual deficits  Visual History: No reported problems       Neurological  Evaluation (if indicated)   Neuro Deficits: No    Pain Assessment   Patient complains of pain: Yes  Pain Quality: Discomfort;Sore  Pain Intensity: Patient unable to rate  Pain Location: Right;Arm  Action Taken: Nursing notified                   Exercises  Other Exercise(s): Pt provided Codman's exercises and HEP at R shoulder, pt verbalized and demo'd good understanding. Pt given elbow, wrist and hand exercises, as well with good understanding and demo. RUE ROM exercises performed 10xs in all planes at all joints         Patient Status   Activity Tolerance: Good  Oxygen Needs: Room Air  Response to Treatment: Tolerated treatment well  Compliance with Precautions: Good  Call light in reach: Yes  Presentation post treatment: In bed;Lines/drains intact  Comments: Pt limited by nausea and dizziness. Vitals WNL. Anticipate pt will be safe to d/c when medically clear. No further skilled IP OT needs required at this time.    Interdisciplinary Communication   Interdisciplinary Communication: Nurse;Physical Therapist      ASSESSMENT   Rehab Potential: Good  Inpatient Recommendation: OT evaluate & discharge;No acute care OT needs                     OT Recommendations   Discharge Recommendation: No skilled OT needs upon discharge;No further therapy needs  Discharge concerns: Requires assistance for self care  Discharge Equipment Recommended: None  Equipment ordered: Not applicable    AM-PAC   AM-PAC Daily Activity t-Scale Score: 44.27  AM-PAC Daily Activity CMS 'G Code' Modifier: CJ    Evaluation Completed by: Quentin Angst, OT,  06/16/2019

## 2019-06-16 NOTE — Other
Patients Clinical Goal:   Clinical Goal(s) for the Shift: VSS, Pain & Nausea Management, Safety, RUE neuro check WNL  Identify possible barriers to advancing the care plan: NONE  Stability of the patient: Moderately Stable - low risk of patient condition declining or worsening   End of Shift Summary: VSS, PRN oxycodone for pain management. Initially when patient getting OOB states nauseated no emesis. Using call light to request assistance. Rght radial pulse WNL. I will continue to monitor until report given to oncoming shift.

## 2019-06-16 NOTE — Consults
Physical Therapy Evaluation      PATIENT: Vanessa Terry  MRN: [id number]  DOB: 1970/04/18    ADMIT DATE:        Date of Evaluation: 06/16/2019    Problems: Active Problems:    Closed displaced comminuted fracture of shaft of right humerus POA: Yes       Past Medical History:   Diagnosis Date   ??? AK (actinic keratosis)    ??? Anemia    ??? Anxiety    ??? Coccyx pain    ??? Constipation    ??? Depression    ??? IBS (irritable bowel syndrome)    ??? Incontinence in female    ??? Tinnitus of left ear    ??? Vertigo     Past Surgical History:   Procedure Laterality Date   ??? CESAREAN SECTION  2014   ??? KNEE SURGERY  1994    in [place], arthroscopic   ??? LAPAROSCOPY DIAGNOSTIC / BIOPSY / ASPIRATION / LYSIS  2011   ??? MY[facility]ECTOMY  2012    [facility]   ??? OVARY SURGERY  2011   ??? REMOVAL OF R OVARIAN CYST  2011    [facility]         Relevant Hospital Course: Pt is a 49 year-old female s/p slip and fall on 06/09/2019 with RIGHT proximal humerus fracture. Pt is now s/p ORIF RIGHT proximal humerus on 06/15/2019.    Patient Stated Goal: Pt amenable to PT Evaluation.     Living Arrangements   Type of Home: Mobile home  Home Layout: One level, Stairs to enter with rails  # Stairs to enter: 3(w/ R railing)  Bathroom Shower/Tub: Pension scheme manager: Midwife: None  Home Equipment: None    Prior Level of Function   Level of Independence: Independent  Lives With: Spouse, Family(Husband and 67 year-old son and daughter)  Support Available: Family  # of hours available: Husband available 24/7 as needed  ADL Assistance: Independent  Homemaking Assistance: Independent  Vision: Glasses  Hearing: Within Education administrator: Drives Self    Precautions   Precautions: Dance movement psychotherapist;Check Labs  Orthotic: Right;Arm sling  Current Activity Order: Order implies OOB(''OK for gentle ROM shoulder, elbow, wrist, fingers'')  Weight Bearing Status: Non Weight Bearing;Right Upper Extremity    GENERAL EVALUATION   Patient Presentation: In bed;SCDs;IV;Drain(Davol drain x 1 (RUE))    Bed Mobility   Supine to Sit: Stand by Assist;to Right  Sit to Supine: Stand by Assist;to Left    Functional Mobility   Sit to Stand: Stand by Assist;Verbal Cueing(w/ L IV pole)  Ambulation: Stand by Assist;Verbal Cueing  Ambulation Distance (Feet): 150'  Gait Pattern: Decreased weight shift;Decreased stride length;Decreased pace;Narrow Base of Support  Assistive Device: Left;IV pole  Stairs: Contact Guard Assist;Verbal Cueing  Stair Management Technique: Forwards;Backwards;With hand held assist  Number of Stairs: 3(Portable step)     Balance   Sitting - Static: Good;without UE support  Sitting - Dynamic: Good;without UE support  Standing - Static: Fair;without UE support(Fair+)  Standing - Dynamic: Fair;without UE support(Fair+)     UE Assessment   R UE Assessment: Impaired strength  Comment: Grossly 1+/5 at shoulder and elbow; grossly 2-/5 throughout (limited by pain; formal MMT deferred 2/2 NWB status)  L UE Assessment: Within functional limits     LE Assessment   RLE Assessment: Gross Assessment  Grossly >4/5    LLE Assessment: Gross Assessment  Grossly >4/5  Sensation   Sensation: Grossly intact    Cognition   Cognition: Within Defined Limits  Safety Awareness: Good awareness of safety precautions  Barriers to Learning: None    Neurological Evaluation (if indicated)   Neuro Deficits: No    Pain Assessment   Patient complains of pain: Yes  Pain Quality: Aching;Discomfort;Sore  Pain Scale Used: Numeric Pain Scale  Pain Intensity: 2/10;3/10;with activity  Pain Location: Right;Arm;Shoulder  Action Taken: Nursing notified;Therapy techniques to manage pain;Positioning    Exercises  Other Exercise(s): Gentle PROM shoulder flexion, abduction; elbow flexion/extension; wrist flexion/extension, ulnar/radial deviation; finger flexion/extension x 10 reps each - all performed within pain-free range.    Patient Status   Activity Tolerance: Good  Oxygen Needs: Room Air  Response to Treatment: Fatigued;Pain;with activity;Resolved with rest;Nursing notified  Compliance with Precautions: Good  Call light in reach: Yes  Presentation post treatment: In bed;Lines/drains intact;SCD's;Other (Comment)(RUE elevated)    Interdisciplinary Communication   Interdisciplinary Communication: Nurse;Physician;Occupational Therapist    ASSESSMENT   Inpatient Recommendation: PT evaluate & discharge    PT Recommendations   Discharge Recommendation: Physical Therapy;Would benefit from continued therapy  Discharge concerns: Requires supervision for mobility;Requires supervision for self care  Discharge Equipment Recommended: Cane  Equipment ordered: MD aware of need to place order  Comments: Pt to greatly benefit from Outpatient Physical Therapy to address residual strength, range of motion and coordination deficits in order to optimize functional independence.    AM-PAC   AM-PAC Basic Mobility t-Scale Score: 45.55  AM-PAC Basic Mobility CMS 'G Code' Modifier: CJ    Evaluation Completed by: Rexford Maus, PT, DPT  06/16/2019

## 2019-06-16 NOTE — Consults
CASE MANAGER ASSESSMENT      Admit EAVW:[id number]    Date of Initial CM Assessment: 06/16/2019    Problems: Active Problems:    * No active hospital problems. *       Past Medical History:   Diagnosis Date   ??? AK (actinic keratosis)    ??? Anemia    ??? Anxiety    ??? Coccyx pain    ??? Constipation    ??? Depression    ??? IBS (irritable bowel syndrome)    ??? Incontinence in female    ??? Tinnitus of left ear    ??? Vertigo     Past Surgical History:   Procedure Laterality Date   ??? CESAREAN SECTION  2014   ??? KNEE SURGERY  1994    in [place], arthroscopic   ??? LAPAROSCOPY DIAGNOSTIC / BIOPSY / ASPIRATION / LYSIS  2011   ??? [facility]OMY  2012    [facility]   ??? OVARY SURGERY  2011   ??? REMOVAL OF R OVARIAN CYST  2011    [facility]           Primary Care Physician:Milanes-Skopp, Elias Else., DO  Phone:[phone]      NEEDS ASSESSMENT:     Level of Function Prior to Admit: Self Care/Indep. W ADLs    Current Living Situation: Lives w/Capable Spouse    Number of medications: 1 to 5 Do you need more information about any of the medications you take?: No     Hours of Assistance/Day: as needed    Support Systems: Spouse/significant other    Contact Name: Veola, Cafaro Spouse   Phone Number: [phone]        DPOA?: Yes(on file)   DPOA Type: Medical      Living Accommodation: Home/Apartment   Bathroom on Main Floor: No   Stairs in Home: 0      Other Therapies Prior to Admit: None       Rental DME/O2 in Home: No    DME Owned by Patient: Other (Comment)(None)    Who is your PCP?: Brandy Hale, DO  Do you have your Primary Care Doctor's office number?: [phone])  How often do you visit your doctor?: Annual    How will you be transported to your doctor's appointment?: Family transportation    How are you coping with your medical condition?: Family support    Do you need information/education regarding your medical condition?: No       Verbalized financial concerns?: No       Were you hospitalized in the last 30 days?: No    DISCHARGE ASSESSMENT:     Projected Date of Discharge: 06/16/2019    Anticipated Complex D/C?: No    Projected Discharge to: Home    Projected Discharge Needs: DME(TBD)  Single point cane per PT recommendations    Support Identified at Discharge: Spouse  Name of Support Person: same as above Phone Number: same as above     Who is available to transport you upon discharge?: Family Transportation         Lennox Pippins. Yicel Shannon, BSN, RN   Ortho/Vascular Surgery Services Case Manager  [facility]  Department of Care Coordination and Clinical Social Work  Phone: [phone]/ Pager: [phone]  Fax:   [phone]  E-mail: [email or web address]    06/16/2019

## 2019-06-16 NOTE — Op Note
2040- Report from [facility] to assume primary care.  Pt asleep, awakens easily, VSS, breathing easy on FM O2.    2130- Husband updated via telephone.    2150- Resting comfortably, VSS.    2205- Ortho to bedside for eval.    ----------------------------------------------------------------------------------------------------------------------  (THE FOLLOWING IS FOR NURSING REFERENCE AND HAS BEEN PULLED FROM THE CURRENT CHART. PACU Phone: [phone])    Procedure(s) (LRB):  ORIF RIGHT PROXIMAL HUMERUS (Right)  Closed fracture of proximal end of right humerus, unspecified fracture morphology, initial encounter [E95.284X]  Treatment Team     Provider Relationship Specialty Contact    Kemper Durie Arrie Aran, RN Registered Nurse --     Donley Redder., MD Attending --  [phone]    Trauma, Orthopaedics - Team Orthopaedic Surgery, Trauma  [id number]         __________________________________________________________________________________    History  Past Medical History:   Diagnosis Date   ??? AK (actinic keratosis)    ??? Anemia    ??? Anxiety    ??? Coccyx pain    ??? Constipation    ??? Depression    ??? IBS (irritable bowel syndrome)    ??? Incontinence in female    ??? Tinnitus of left ear    ??? Vertigo      Past Surgical History:   Procedure Laterality Date   ??? CESAREAN SECTION  2014   ??? KNEE SURGERY  1994    in [place], arthroscopic   ??? LAPAROSCOPY DIAGNOSTIC / BIOPSY / ASPIRATION / LYSIS  2011   ??? [facility]OMY  2012    [facility]   ??? OVARY SURGERY  2011   ??? REMOVAL OF R OVARIAN CYST  2011    [facility]      __________________________________________________________________________________    Labs  Recent Labs     06/15/19  2054 06/13/19  1725   WBC  --  7.55   HCT 25.5* 32.1*   HGB 8.4* 10.3*   PLT  --  254   PT  --  12.6   APTT  --  28.9   INR  --  1.0   NA  --  139   K  --  4.1   CL  --  104   CO2  --  23   BUN  --  14   CREAT  --  0.63   GLUCOSE  --  80     __________________________________________________________________________________    Vital Signs Last Recorded Vital Signs:    06/15/19 2130   BP: 113/69   Pulse: 71   Resp: 10   Temp:    SpO2: 94%     Temp Readings from Last 1 Encounters:   06/15/19 36.5 ???C (97.7 ???F) (Forehead)       Pain Information (Last Filed)     Score Location Comments Edu?    Patient Asleep None None None        __________________________________________________________________________________    Intake and Output  I/O last 3 completed shifts:  In: -   Out: 300 [Blood:300]  I/O this shift:  In: 1000 [I.V.:1000]  Out: -      __________________________________________________________________________________    IV Drips/Fluids/PCA:   ??? duobiotic orthopedic irrigation solution 500 mL       __________________________________________________________________________________    Lines, Drains, and Airways  Surgical Drain Right RUQ Davol (Active)     Peripheral IV 20 G Left Antecubital (Active)     __________________________________________________________________________________    ----------------------------------------------------------------------------------------------------------------------  PACU NursingTransfer Note  9:56 PM, 06/15/2019      Isolation? No    Antibiotics in OR:  Last Antibiotic (last 24 hours) Showing orders from other encounters    Date/Time Action Medication Dose    06/15/19 1839 Given    duobiotic orthopedic irrigation solution 500 mL 500 mL    06/15/19 1839 Given    vancomycin topical powder 1,000 mg    06/15/19 1620 Given    ceFAZolin inj 2 g        Last Antiemetic:   Med Administrations and Associated Flowsheet Values (last 4 hours) Showing orders from other encounters    Date/Time Action Medication Dose    06/15/19 1912 Given    ondansetron 4 mg/2 mL inj 4 mg        Acetaminophen given @:  Med Administrations and Associated Flowsheet Values (last 24 hours)     Date/Time Action Medication Dose    06/15/19 2030 Given    acetaminophen IV inj 1,000 mg 1,000 mg    06/15/19 1443 Given    acetaminophen tab 1,000 mg 1,000 mg        Last pain medication:   Pain Meds (last 4 hours) Showing orders from other encounters    Date/Time Action Medication Dose    06/15/19 2030 Given    acetaminophen IV inj 1,000 mg 1,000 mg    06/15/19 2015 Given   [given at 2014 documentedalready]    fentaNYL (PF) 100 mcg/2 mL inj 25 mcg 25 mcg/mL    06/15/19 2014 Given    fentaNYL (PF) 100 mcg/2 mL inj 25 mcg 25 mcg    06/15/19 1951 Given    fentaNYL (PF) 100 mcg/2 mL inj 25 mcg    06/15/19 1951 Given    ketorolac 30 mg/mL inj 30 mg    06/15/19 1936 Given    propofol 200 mg/20 mL inj 30 mg    06/15/19 1850 Given    HYDROmorphone 1 mg/mL inj 0.4 mg        Last Txp Anti-rejection medication:  Anti-rejection meds. (last 24 hours) Showing orders from other encounters    Date/Time Action Medication Dose    06/15/19 1555 Given    dexamethasone 4 mg/mL inj 8 mg          Does the patient use prescription pain medication at home (prior to admission)?Marland KitchenMarland KitchenMarland KitchenUnknown    Pain level: Acceptable for patient? Yes    Difficult Airway? No    Respiratory is at baseline? Yes    Has the patient received Flumazenil or Narcan in PACU? No  (if ''Yes'', must be at least 45 minutes prior to transfer)     Aldrete: 10    Level of Consciousness:  Drowsy, awakens easily.    Cardiac Rhythm?  Normal Sinus    Surgical Site: Intact and Dry or with Minimal Drainage  Lines, Drains, and Airways     Wound              Incision 06/15/19 Right Arm less than 1 day               Diet: Regular    Tolerating liquids: Undetermined    Activity: MAE: Has not ambulated    Voided:  Due to Void    Significant Other Location:  at home    Will Transport to: Floor                       With: HA, Escort  or Care Partner    Continuity of Care Issues from OR or PACU:   Call from husband to make sure she gets a note he left for her in her belongings

## 2019-06-16 NOTE — Brief Op Note
Brief Operative/Procedure Note    Patient: Vanessa Terry    Date of Operation(s)/Procedure(s): 06/15/2019    Pre-op Diagnosis: right proximal humerus fracture       Post-op Diagnosis: humeral shaft fx    Operation(s)/Procedure(s):  ORIF RIGHT PROXIMAL HUMERUS    Surgeon(s) and Role:     Zara Chess., MD - Primary     * Elizabeth Palau., MD - Surgeon 1st Assist - Resident     * Owens Loffler, Grafton Folk., MD - Surgeon Assistant - Resident    Anesthesia Staff and Role:  CRNA: Noralee Stain., CRNA  Anesthesia Resident: Ardeth Perfect., MD  Anesthesiologist: Ranee Gosselin., MD; Lenetta Quaker., MD; Carlyle Lipa., MD    Anesthesia Type:   General    Pre-Op Medications: ancef    Estimated Blood Loss: Minimal    Findings: humeral shaft with extension to surgical neck    Complications: None; patient tolerated the operation(s)/procedure(s) well.                 Specimens:   * No specimens in log *    Drains:   Surgical Drain Right RUQ Davol (Active)   Site Assessment Unable to view 06/15/19 2001   Dressing Status Clean, dry, intact 06/15/19 2001   Drain Status Open to gravity drainage 06/15/19 2001   Drainage Appearance Bloody 06/15/19 2001            Staff and Role:   Circulating Nurse: Olene Craven, RN  Radiology Technologist: Pam Drown Person: Rockie Neighbours, RN; Collier Bullock, RN  X-Ray Technologist: Luvenia Starch. Guy Sandifer, MD    Date: 06/15/2019  Time: 8:21 PM

## 2019-06-16 NOTE — Op Note
DATE OF OPERATION:  06/15/2019      PREOPERATIVE DIAGNOSIS:  Closed segmental comminuted proximal 1/3 humeral shaft fracture, right.    POSTOPERATIVE DIAGNOSIS:  Closed segmental comminuted proximal 1/3 humeral shaft fracture, right, with intact radial nerve.    NAME OF OPERATION:  Open reduction/internal fixation of segmental comminuted fracture right proximal humerus with 3.5 mm DC plates, AO-Synthes plate and screws.      SURGEON:  Nelta Numbers, MD [phone])      ASSISTANT:  Geraldo Pitter. Darlina Sicilian, MD [phone])    SECOND ASSISTANT:  Hurley Cisco, MD [phone]), Orthopedics    INDICATIONS:  The patient had been seen in clinic approximately 2 days ago after sustaining over the weekend a fracture of her right humerus involved in a horse riding accident and was initially treated at an outside facility by placement in an arm sling only. She presented with a rather painful proximal right arm in an arm sling in clinic. X-rays showed a comminuted long oblique fracture, approximately 4 to 4.5 cm distance in length over the proximal 3rd portion of the humeral shaft. There were 2 medial butterfly fragments which were spiral oblique and a long oblique distal fragment and an extension of the fracture line into the proximal metaphyseal area of the right proximal humerus. We discussed with this patient the high probability that with this amount of displacement with this fracture it would take a significant period of time to heal. She would end up with a shortened malrotated and angulated right humeral shaft and that this would be a significantly poor result compared to a successful operative treatment using internal fixation, restoring the bone to length, making it stable and allowing her to undergo rehab immediately to right shoulder and elbow, hand and wrist. The patient and her husband understood this and agreed with the operative treatment. She had been screened by her medical doctor and COVID test and presents now for internal fixation.    DESCRIPTION OF PROCEDURE:  The patient was brought to the operative suite, placed in the supine position, and given general endotracheal intubation on the Maquet table. This was then placed in a semi-sitting position. A roll of towels was placed underneath the right chest elevating this off the edge of the table approximately 20 degrees. The safety sign in was performed and side attestation performed after the patient received her initial general anesthetic. The right arm, shoulder and neck were then prepped and draped in fashion as was her hand and sterilely draped. The fire assessment, safety sign in and side attestation equipment checks were all performed. A deltopectoral incision was made for the lower half of the deltoid medial edge muscle down to the lateral aspect of the shaft in a slight curved position. This was taken down to half of the distal 40% of the arm. Subcutaneous tissues were dissected sharply. Dissection through the fascia of the arm was then achieved. Identification of the cephalic vein was accomplished. This was maintained in position. Dissection was carried lateral to this. A flap on the medial edge of the deltoid was then elevated. An incision of the muscle fascia, the brachia radialis and over the deltoid was accomplished. The deltoid insertion was initially found to be on the proximal fragment. This had to be taken down enough to expose the fracture lines and then dissection was carried through the brachialis muscle distally anterior to the anterior-lateral aspect of the humeral shaft with dissection being carefully taken down to make sure no radial nerve crossing was  identified. The fracture fragments were significantly long. Three were rotationally oblique. These had to be cleaned of their large amount of clot which was taken out between the major and posterior proximal distal fragment. Approximately 4 large clots, 2 and 3 cm in diameter were then removed and all fracture inner surfaces were cleaned of any clot debris and the edges of the fracture lines were cleaned of any soft tissue to allow [place] anatomic reduction. Initially, the reduction was then achieved between the anterior-medial component of the proximal fragment to the medial and posterior-medial oblique spiral fracture. This was achieved by using 2 AO-S clamps, 1 large stiletto clamp, and a 2nd small clamp to hold this knife edge fracture in position followed by placement of 2 anterior to posterior lag screws just lateral to the crest of the anterior proximal anterior fragment. Once these two 2.7 mm lag screws were then placed, this gave this proximal fragment one piece rotation. Then, we could then bring the long oblique distal fragment into position with multiple attempts finally aligning this up anatomically. The clamps were placed across these fracture fragments in a way that we could then put a lateral to medial compression screw in the midportion of the distal edge of the proximal fragment into the lateral edge of the proximal end of the distal fragment. This was also a 2.7 mm lag screw and a second anterior-posterior free-standing lag screw was then placed between the shaft of the anterior component to the distal component. This allowed the clamps to be taken off the major bone fragments and the humerus fracture had been anatomically reduced. We took a 12-hole 3.5 DCP plate, contoured it to fit the slope of the anterolateral portion of the proximal fragment with a slight curve to fit the deltoid tuberosity and then a slight bend for the distal shaft. The plate was then placed and felt to be anatomic in position. Proximal and distal screws were placed followed by a second set of suture screws in the proximal and distal fragments. This gave [place] a stable fracture to rotate for x-rays. Fluoroscopy was brought in. We then obtained information on the reduction which was anatomic. With this, we were then able to plan the directions for any interplate lag screws, which was performed on the distal oblique fracture of the medial side from the lateral side through the plate, antiglide plate positions for the lateral segment of the plate underneath the edge of the bone distally, and then proximal distal screws were placed to further complete the osteosynthesis. Fluoroscopy verified anatomic reduction of the comminuted segmental humerus fracture to the plate. Plate was then positioned for all components. No fracture lines were evident. All lag screws were placed appropriately. Wounds were then irrigated following which closure was performed by closing the fascia of the anterior arm with #1 Vicryl and repair of the fascial interval between the medial edge of the deltoid and the fascia over the insertion of the pectoralis was also closed. Drain was placed, brought out through a separate anterior-lateral stab wound, and lower portion of the anterior deltoid. Vancomycin was placed on the wound followed by closure of Scarpa's layer of fascia with #1 Vicryl, buried 3-0 Monocryl suture subdermal, 3-0 Prolene sutures to the skin. Sterile dressings were placed.   The patient was placed in an arm sling, awakened, and transferred to the recovery room. X-rays were obtained prior to her transfer and prior to her awakening.    COMPLICATIONS:  None.    ESTIMATED BLOOD  LOSS:  30 cc    COUNTS:  Sponge and needle counts were correct.    SPECIMENS:  None.    ATTESTATION:  I performed this operation.      Nelta Numbers, MD [phone])        EJ/MODL CONF#: [id number]  D: 06/16/2019 13:20:33 T: 06/16/2019 14:37:27 DOCUMENT: [id number]

## 2019-06-16 NOTE — Op Note
Received pt from OR via bed with monitors in situ all alarms in situ.Pt arouses to speech,c/o nausea and pain ,per MD meds given prior to leaving OR.Right upper arm old bruising present,arm in sling.Davol drain in situ to gravity only no suction.assessment as charted.  2014 pain meds given.  2021 intern paged for post op orders.

## 2019-06-16 NOTE — Consults
Internal Medicine Consultation         DATE OF SERVICE: 06/16/2019   ADMISSION DATE:       HOSPITAL DAY: 0  PMD: Milanes-Skopp, Elias Else., DO    PRIMARY TEAM: Orthopaedics  REQUESTING PHYSICIAN(S): Donley Redder., MD    CHIEF CONCERN/REASON FOR CONSULTATION: dizziness    History of Present Illness   Vanessa Terry is a 49 y.o. female with PMH of depression who was admitted for overnight recovery after ORIF on 4/22 for R humerus fracture.    Patient had fall on 4/16, when she slipped on her way to the bathroom and fell forwards onto her face and outstretched R hand. She chipped her front tooth, and had pain in R arm. XR with closed R proximal humerus fracture, underwent repair yesterday 4/22. This AM, compared of headache and dizziness associated with positional changes. Not on any blood thinners at home. Per primary team, patient did not have significant blood loss during surgery.     Per patient, HA started yesterday and overall unchanged this AM. Pain is a dull ache involving bilateral forehead and extending back. No neck or sinus pain. No photophobia/phonotherapy, congesting, eye tearing, weakness/paresthesias, nausea. Does not usually get headaches. On ATC tylenol. Dizziness started today, associated with standing up, lasts for a few seconds. Was able to walk with PT without dizziness. No dizziness currently while resting in bed.    On reassessment later, pt reported that both HA and dizziness had resolved.    Notably Hb has decreased 13 in 04/2018 to 10 just prior to surgery on 06/13/2019. Post-op, Hb decreased to 8.3 but has been stable x2, per surgery team she did not have significant intraop bleeding.       Abbreviated Hospital Course       REVIEW OF SYSTEMS:  A complete review of 14 systems was performed. Additional symptoms were otherwise negative and/or non-contributory, except as discussed above.    PRIOR RECORDS:  I have reviewed the relevant prior records in [facility], and summarized them as relevant in the HPI.    PAST MEDICAL HISTORY:  She has a past medical history of AK (actinic keratosis), Anemia, Anxiety, Coccyx pain, Constipation, Depression, IBS (irritable bowel syndrome), Incontinence in female, Tinnitus of left ear, and Vertigo.    PAST SURGICAL HISTORY:  She has a past surgical history that includes Laparoscopy diagnostic / biopsy / aspiration / lysis (2011); Myomectomy (2012); Ovary surgery (2011); Removal of R Ovarian Cyst (2011); Cesarean section (2014); and Knee surgery (1994).    SOCIAL HISTORY:  Lives with: husband twin daughter at home  Denies current smoking, alcohol, drug use    She reports that she has quit smoking. She has never used smokeless tobacco. She reports that she does not drink alcohol or use drugs.    FAMILY HISTORY:  Family History   Problem Relation Age of Onset   ??? Aortic stenosis Mother         s/p AV valve replacement   ??? Atrial fibrillation Mother         deceased from cardiosynch procedure   ??? Breast cancer Mother 11   ??? Alcohol abuse Mother    ??? Arthritis Mother    ??? Depression Mother    ??? Hyperlipidemia Mother    ??? Hypertension Mother    ??? Stroke Mother    ??? Skin cancer Mother    ??? Other (myocardial infarction) Mother    ??? Aortic stenosis Cousin  s/p AV valve replacement   ??? Meniere's disease Other         niece   ??? Colon cancer Maternal Uncle 45   ??? Rectal cancer Sister 110   ??? Depression Sister    ??? Alcohol abuse Sister    ??? Arthritis Sister    ??? Atrial fibrillation Father    ??? Heart failure Father    ??? Melanoma Father    ??? Alcohol abuse Father    ??? Arthritis Father    ??? Diabetes type II Father    ??? Hyperlipidemia Father    ??? Hypertension Father    ??? Kidney disease Father    ??? Learning disabilities Father    ??? Stroke Father    ??? Other (myocardial infarction) Father    ??? Hypertension Brother    ??? Learning disabilities Brother    ??? Alcohol abuse Brother    ??? Arthritis Brother    ??? Arthritis Paternal Grandmother    ??? Alcohol abuse Paternal Grandfather    ??? Arthritis Paternal Grandfather    ??? Alcohol abuse Sister    ??? Arthritis Sister    ??? Diabetes type II Brother    ??? Arthritis Brother    ??? Other (congenital cataract) Daughter    ??? Club foot Niece    ??? Other (aortic valve replaced) Maternal Cousin        ALLERGIES:  Allergies   Allergen Reactions   ??? Sulfamethoxazole-Trimethoprim      Eye swelling       HOME MEDICATIONS:  Prior to Admission medications    Medication MD Sig Start Date End Date Taking? Authorizing Provider   amoxicillin 500 mg capsule 500 mg, Oral, 3 times daily 06/13/19 06/20/19 Yes Milanes-Skopp, Rose E., DO   PAROXETINE 10 mg tablet TAKE 1 TABLET BY MOUTH EVERY DAY 06/12/19  Yes Milanes-Skopp, Rose E., DO   acetaminophen 325 mg tablet Take 1-2 tablets by mouth every 4 hours as needed for pain. 06/16/19   Donley Redder., MD   bisacodyl 5 mg EC tablet 5 mg, Oral, Daily PRN 06/16/19 07/16/19  Donley Redder., MD   oxyCODONE 5 mg tablet 5 mg, Oral, Every 4 hours PRN 06/16/19 06/23/19  Sudie Bailey, MD   prochlorperazine 10 mg tablet 10 mg, Oral, Every 6 hours PRN 06/16/19   Donley Redder., MD       INPT MEDICATIONS:  acetaminophen, 650 mg, Oral, Q6H  amoxicillin, 500 mg, Oral, TID  docusate, 100 mg, Oral, BID  PARoxetine, 10 mg, Oral, Daily  PRNs: aluminum-magnesium hydroxide-simethicone, bisacodyl, calcium carbonate, magnesium hydroxide, oxyCODONE, oxyCODONE, prochlorperazine, prochlorperazine, senna    Infusions:      VITALS:  Temp:  [36 ???C (96.8 ???F)-36.8 ???C (98.2 ???F)] 36.8 ???C (98.2 ???F)  Heart Rate:  [69-87] 80  Resp:  [9-18] 16  BP: (92-123)/(64-78) 101/64  NBP Mean:  [74-90] 74  SpO2:  [93 %-99 %] 93 %     Weight: 68.5 kg (151 lb 0.2 oz) Oxygen Therapy  SpO2: 93 %  O2 Device: None (Room air)  Flow Rate (L/min): 6 L/min     I/O last 2 completed shifts:  In: 2120 [P.O.:1120; I.V.:1000]  Out: 1460 [Urine:1150; Drains:10; Blood:300]    PHYSICAL EXAM:  General: In no acute distress, pleasant and easily engaged in conversation  HEENT: Hearing intact to conversation, EOMI, conjunctiva anicteric. No TTP over sinuses. With palpation of upper forehead and scalp, pt reports improved pain.  Neck: Supple. No JVD.  No cervical lymphadenopathy appreciated. No tenderness to palpation of neck  Heart: regular rate and rhythm with normal S1/S2, no m/r/g. Trace peripheral edema.   Chest: Nonlabored on RA with normal limits respiratory rate. CTAB in all fields with no wheezes or crackles appreciated  Abdomen: Non-distended, soft, non-tender, with no masses or organomegaly appreciated  Extremities: Warm and well perfused with no lower extremity edema. 2+ distal pulses.  Skin: Warm and dry.  Neuro: AOx4, CNII-XII intact. Strength 5/5 in upper and lower extremities, symmetric. Sensation to light touch intact  Psych: Well-groomed. Cooperative with interview and able to follow commands. Speech, behavior, and affect are unremarkable.    STUDIES  I have reviewed the following information from the last 24 hours: allied health and treating physician notes, imaging, labs and microbiology data and cardiac studies and telemetry data (if on monitor).    TELEMETRY:    LABS:  CBC  Trended Labs     06/16/19  0452 06/15/19  2054 06/13/19  1725   WBC 12.86*  --  7.55   HGB 8.4* 8.4* 10.3*   MCV 93.3  --  95.8   PLT 233  --  254     Diff  Trended Labs     06/16/19  0452 06/13/19  1725   NEUTPCT 88.5 55.8   NEUTABS 11.39* 4.21   LYMPHPCT 6.5 35.2   EOSABS 0.00 0.10     BMP  Trended Labs     06/13/19  1725 08/18/18  0859   NA 139 141   K 4.1 4.7   CO2 23 21   ANIONGAP 12 15   BUN 14 15   CREAT 0.63 0.74     LFTs  Trended Labs     06/13/19  1725 08/18/18  0859   TOTPRO 7.5 7.5   ALBUMIN 4.2 4.5   BILITOT 0.4 1.1   AST 24 23   ALT 22 13   ALKPHOS 49 42     Ca panel  Trended Labs     06/13/19  1725 08/18/18  0859   CALCIUM 9.1 9.6     Urine  Trended Labs     05/05/19  1013   PHUR 5.5   BLDUR Moderate (++)*   WBCSUR Moderate (++)*         IMAGING  I have personally reviewed pertinent imaging as below.  No imaging has been resulted in the last 24 hours    MICROBIOLOGY  Recent Results (from the past 168 hour(s))   COVID-19 PCR, (Asst) Mid-turbinate    Collection Time: 06/13/19 11:28 AM    Specimen: (Asst) Mid-turbinate; Respiratory, Upper   Result Value Ref Range    Specimen Type Respiratory, Upper     COVID-19 PCR Not Detected Not Detected       OTHER STUDIES  I have reviewed the pertinent studies.    ASSESSMENT:  Vanessa Terry is a 49 y.o. female PMH of depression who was admitted for overnight recovery after ORIF on 4/22 for R humerus fracture. IMCS consulted for postoperative headache and dizziness.    # Headache: history suggestive of tension headache with pain on bilateral upper scalp, no photophobia/photophonia/nausea, and no focal neurological signs. Pt does report head trauma with fall but low suspicion for intracranial bleed given lack of neurological signs, pt not on blood thinners, and HA started nearly a week after actual fall. Additionally, HA had resolved upon reassessment later afternoon.  # Dizziness: associated with positional change. Orthostatics negative. Likely multifactorial  in setting of recent surgery, anesthesia. Encourage PO hydration. Also resolved by time of reassessment.  # Anemia: Per surgery, hemoglobin drop from 10 to 8 post-operatively is commonly seen, possibly in the setting of IV hydration during surgery, and has remained stable. However, unclear why Hb decreased from 13 to 10 over the past year. Recommend obtaining iron studies, folate, B12, which can be followed up outpatient.  # R humeral fracture s/p ORIF  # Depression: chronic/stable  # Anxiety: chronic/stable  # IBS: chronic/stable      RECOMMENDATIONS:  1. For headache, can take prn ibuprofen up to 800mg  TID prn in addition to tylenol if okay per othopedics team  2. Encourage PO hydration  3. Counsel patient to get up slowly, sit on edge of bed for a few minutes prior to standing up  4. If possible, obtain iron studies, folate, B12 which can be followed up by PCP. Otherwise, can be ordered by PCP  5. Follow up primary care doctor. We will try to facilitate appointment with a new PCP in [id number] per patient request.       Patient staffed with attending, Dr Tonita Cong. Please page [phone] with questions. We will sign off.    AUTHOR:  Signed,  Gunnar Fusi, MD  Internal Medicine PGY-3  06/16/2019     I saw and evaluated Vanessa Terry.  I have reviewed labs, radiology, and notes. I discussed the case with the resident and agree with the findings and plan of care as documented in the resident's note.   Headache and dizziness resolved by the time that I evaluated the patient. She had steady ambulation. Suspect oxycodone contributing, pt not planning to take upon discharge. No neuro deficits, would not obtain any imaging. Acute drop in hgb expected postoperatively, but chronic drop less expected. Would obtain reversible anemia w/u as above. Stable for discharge from a medical perspective.    Signed: Royann Shivers, MD

## 2019-06-17 LAB — Iron & Iron Binding Capacity: IRON: 71 ug/dL (ref 41–179)

## 2019-06-17 LAB — Ferritin: FERRITIN: 65 ng/mL (ref 8–180)

## 2019-06-17 NOTE — Other
Patients Clinical Goal:   Clinical Goal(s) for the Shift: promote comfort, pain/nausea management, encourage to ambulate with assist, maintain stable v/s and safety  Identify possible barriers to advancing the care plan:   Stability of the patient: Moderately Stable - low risk of patient condition declining or worsening   End of Shift Summary: VSS. HA subsided after pt finished lunch. Brain CT canceled. Pt received PT and OT this am. Vit 12 and folate blood lab was canceled because pt has hard stick and refused. Pt will get it when pt visits primary MD after discharge. PIV removed. Drain removed by primary MD. Discharge medication delivered by out patient pharmacist. Discharge instruction given to the pt with verbalize understanding. Pt left the floor via wheelchair by escort to the main lobby to her ride. Pt discharged.

## 2019-06-18 ENCOUNTER — Telehealth: Payer: BLUE CROSS/BLUE SHIELD

## 2019-06-18 ENCOUNTER — Ambulatory Visit: Payer: BLUE CROSS/BLUE SHIELD

## 2019-06-18 MED ORDER — HYDROCODONE-ACETAMINOPHEN 5-325 MG PO TABS
1 | ORAL_TABLET | ORAL | 0 refills | Status: AC | PRN
Start: 2019-06-18 — End: ?

## 2019-06-18 NOTE — Telephone Encounter
Patient called requesting prescription for norco. Patient did not fill oxycodone prescription at the time of discharge, has been taking norco 5/325 which was prescribed at the time of her injury. Pain has been well controlled and improving daily. Norco 5/325 x15 tabs prescribed to [facility] [place] at [place] as requested. Cancelled original oxy prescription for [employer].    Marshia Ly, MD  Resident Physician  [facility]

## 2019-06-19 NOTE — Telephone Encounter (Signed)
Left patient a voicemail with information below. Let patient know to call us back to schedule an appointment. 

## 2019-06-19 NOTE — Telephone Encounter (Signed)
Please call this patient to schedule a virtual visit for ongoing cough/respiratory issues.   Thank you

## 2019-06-20 ENCOUNTER — Telehealth: Payer: BLUE CROSS/BLUE SHIELD

## 2019-06-20 DIAGNOSIS — D508 Other iron deficiency anemias: Secondary | ICD-10-CM

## 2019-06-20 MED ORDER — FERROUS GLUCONATE 324 (37.5 FE) MG PO TABS
ORAL_TABLET | 1 refills | Status: AC
Start: 2019-06-20 — End: ?

## 2019-06-20 NOTE — Progress Notes
PATIENT: Vanessa Terry  MRN: [id number]  DOB: 1970/11/11  DATE OF SERVICE: 06/20/2019    CC:  Chief Complaint   Patient presents with   ??? Follow-up     per pt follow up for anemia per pt she had surgery of rt arm last week.         HPI/ROS:   Pt is a 49 yo female that is presenting today for above.     Pt had surgery 06/15/2019 for a fracture of the right humerus. Pt and I discussed her labs pre and post surgery.  D/W her lower hemoglobin / hematocrit was initially from her fracture in her arm, and subsequent was from surgery itself.  Pt had her questions answered.  We discussed iron and how to take it with [place] juice twice a day.  We discussed her looking up the [employer] for iron rich foods.  Pt has just started her menses.  We discussed that her menses could be early due to stress from last week of events.  Pt asked about bruising discoloration in her hand.  We discussed that this is due most likely to the blood pooling down from the upper part of her arm.  Pt told to make sure to follow up with ortho with any other concerns that arise.     Pt has had her root canals, and they are still working on her teeth. Pt did stop the antibiotic amoxicillin. She felt it gave her a headache when she took it.     Pt and I discussed Norco effects as pt was describing possible effects of the Norco. She said she stopped it on her own.  Pt is now taking Tylenol and Ibuprofen as needed for pain per her.      Pt has been feeling tired and a little emotional.  D/w pt that this is not unexpected as she has been through a lot in the last week.       Pt told she needs to walk and move around and not just lay in bed. Pt told that she is at increased risk of blood clot and pneumonia if immobile.          Exam:  VS: None available today as this is a Telehealth Visit.     This was a Telehealth visit. The extent of the physical exam was limited to the elements below.     General: Yes Appears alert.  No Acute distress. Yes Well groomed. Pt was tearful at one point during the conversation when we were discussing all that she had been through.   Respiratory: Yes Breathing normally. No Tachypnea  Nose: No Nasal discharge or redness  Skin: No Diaphoresis noted. See below    Right hand: Pt is moving right hand and fingers.  Pt has some discoloration of the hand from blood pooling down due to gravity from above.   Mental Status: Alert and oriented to time, place and person.  Recent and remote memory are intact. Mood and affect are normal.    Assessment and Plan:  1. Other iron deficiency anemia  - ferrous gluconate 324 mg tablet; One tablet BID with 6 to 8 ounces of [place] juice.  Take with a meal..  Dispense: 180 tablet; Refill: 1  - CBC & Auto Differential; Future  - CBC & Auto Differential; Future  Pt and I discussed iron.   Pt and I discussed her eating iron rich foods  Documented under allergies: Amoxicillin as  an intolerance.  Pt will do labs in a month and then in two months.         Telemedicine Questions    Did you receive  consent from Patient? Yes  Patient Consent to Telehealth Questionnaire   [facility] TELEHEALTH PRECHECKIN QUESTIONS 06/20/2019   By clicking ''I Agree'', I consent to the below:  I Agree     - I agree  to be treated via a video visit and acknowledge that I may be liable for any relevant copays or coinsurance depending on my insurance plan.  - I understand that this video visit is offered for my convenience and I am able to cancel and reschedule for an in-person appointment if I desire.  - I also acknowledge that sensitive medical information may be discussed during this video visit appointment and that it is my responsibility to locate myself in a location that ensures privacy to my own level of comfort.  - I also acknowledge that I should not be participating in a video visit in a way that could cause danger to myself or to those around me (such as driving or walking).  If my provider is concerned about my safety, I understand that they have the right to terminate the visit.       Where is the Patient Located?  Home      Time Spent (in minutes) 15 minutes.     Signed: Brandy Hale 06/20/2019 5:55 PM   Author:  Brandy Hale, DO    __________________________________________________________________________________   I attest that two-way communication with audio and video was established with this patient and that the patient gave consent for the telemedicine visit.  I personally obtained the history, reviewed all pertinent notes, laboratory results, and imaging, and formulated all medical decisions as discussed above.  This entire encounter was performed without the assistance of a resident, fellow, PA, or NP.

## 2019-06-21 ENCOUNTER — Ambulatory Visit: Payer: BLUE CROSS/BLUE SHIELD

## 2019-06-21 ENCOUNTER — Encounter: Payer: Self-pay | Admitting: Physician Assistant

## 2019-06-27 ENCOUNTER — Non-Acute Institutional Stay: Payer: BLUE CROSS/BLUE SHIELD | Attending: Orthopaedic Trauma

## 2019-06-27 DIAGNOSIS — S42361A Displaced segmental fracture of shaft of humerus, right arm, initial encounter for closed fracture: Secondary | ICD-10-CM

## 2019-06-27 MED ORDER — GENERIC EXTERNAL MEDICATION
1.00 | Status: DC
Start: 2019-06-27 — End: 2019-06-27

## 2019-06-27 MED ORDER — FAMOTIDINE 20 MG PO TABS
40.00 | ORAL_TABLET | ORAL | Status: DC
Start: 2019-06-27 — End: 2019-06-27

## 2019-06-27 MED ORDER — CYCLOBENZAPRINE HCL 10 MG PO TABS
10.00 | ORAL_TABLET | ORAL | Status: DC
Start: 2019-06-27 — End: 2019-06-27

## 2019-06-27 MED ORDER — ENOXAPARIN SODIUM 150 MG/ML ~~LOC~~ SOLN
150.00 | SUBCUTANEOUS | Status: DC
Start: 2019-06-27 — End: 2019-06-27

## 2019-06-27 MED ORDER — GENERIC EXTERNAL MEDICATION
100.00 | Status: DC
Start: 2019-06-27 — End: 2019-06-27

## 2019-06-27 MED ORDER — AMPHETAMINE-DEXTROAMPHET ER 30 MG PO CP24
30.00 | ORAL_CAPSULE | ORAL | Status: DC
Start: 2019-06-27 — End: 2019-06-27

## 2019-06-27 MED ORDER — CHOLECALCIFEROL 25 MCG (1000 UT) PO TABS
1000.00 | ORAL_TABLET | ORAL | Status: DC
Start: 2019-06-27 — End: 2019-06-27

## 2019-06-27 MED ORDER — GABAPENTIN 400 MG PO CAPS
400.00 | ORAL_CAPSULE | ORAL | Status: DC
Start: 2019-06-27 — End: 2019-06-27

## 2019-06-27 MED ORDER — BUPROPION HCL ER (XL) 150 MG PO TB24
150.00 | ORAL_TABLET | ORAL | Status: DC
Start: 2019-06-27 — End: 2019-06-27

## 2019-06-27 MED ORDER — ZOLPIDEM TARTRATE 5 MG PO TABS
5.00 | ORAL_TABLET | ORAL | Status: DC
Start: ? — End: 2019-06-27

## 2019-06-27 MED ORDER — DICYCLOMINE HCL 10 MG PO CAPS
10.00 | ORAL_CAPSULE | ORAL | Status: DC
Start: 2019-06-27 — End: 2019-06-27

## 2019-06-27 MED ORDER — ALBUTEROL SULFATE HFA 108 (90 BASE) MCG/ACT IN AERS
1.00 | INHALATION_SPRAY | RESPIRATORY_TRACT | Status: DC
Start: ? — End: 2019-06-27

## 2019-06-27 MED ORDER — BUMETANIDE 1 MG PO TABS
0.50 | ORAL_TABLET | ORAL | Status: DC
Start: 2019-06-27 — End: 2019-06-27

## 2019-06-27 MED ORDER — ENOXAPARIN SODIUM 30 MG/0.3ML ~~LOC~~ SOLN
20.00 | SUBCUTANEOUS | Status: DC
Start: 2019-06-27 — End: 2019-06-27

## 2019-06-27 MED ORDER — DICLOFENAC SODIUM 1 % EX GEL
2.00 | CUTANEOUS | Status: DC
Start: 2019-06-27 — End: 2019-06-27

## 2019-06-27 MED ORDER — VORTIOXETINE HBR 20 MG PO TABS
20.00 | ORAL_TABLET | ORAL | Status: DC
Start: 2019-06-27 — End: 2019-06-27

## 2019-06-27 MED ORDER — MONTELUKAST SODIUM 10 MG PO TABS
10.00 | ORAL_TABLET | ORAL | Status: DC
Start: 2019-06-27 — End: 2019-06-27

## 2019-06-27 MED ORDER — SUCRALFATE 1 G PO TABS
0.50 | ORAL_TABLET | ORAL | Status: DC
Start: 2019-06-27 — End: 2019-06-27

## 2019-06-27 MED ORDER — OXYBUTYNIN CHLORIDE ER 5 MG PO TB24
5.00 | ORAL_TABLET | ORAL | Status: DC
Start: 2019-06-27 — End: 2019-06-27

## 2019-06-27 MED ORDER — BENZONATATE 100 MG PO CAPS
100.00 | ORAL_CAPSULE | ORAL | Status: DC
Start: ? — End: 2019-06-27

## 2019-06-27 MED ORDER — AMITRIPTYLINE HCL 25 MG PO TABS
100.00 | ORAL_TABLET | ORAL | Status: DC
Start: 2019-06-27 — End: 2019-06-27

## 2019-06-27 MED ORDER — HYDROCODONE-ACETAMINOPHEN 5-325 MG PO TABS
1.00 | ORAL_TABLET | ORAL | Status: DC
Start: ? — End: 2019-06-27

## 2019-06-27 NOTE — Progress Notes
[facility]opa[facility]  06/27/2019    PATIENT: Vanessa Terry  MRN: [id number]  DOB: 1971-01-11  DATE OF SERVICE: 06/27/2019    REFERRING PRACTITIONER: No ref. provider found  PRIMARY CARE PROVIDER: Marice Potter., DO    Chief Complaint:   Chief Complaint   Patient presents with    Post Op Follow Up     ORIF R Humerus         Subjective:       Katey Arch is a 49 y.o. year-old female seen for follow-up of ORIF right humerus frcture.    Course: stable, inproving    Radiology: none       Objective:         PHYSICAL EXAM:  LMP 05/28/2019   comfortable, well-appearing, no apparent distress, alert, oriented and appropriate mood and affect  Wound healing well. ROM right elbow 10-75, ABD 40 ERot 20       Assessment:       Impression: stable ORIF Segmental humeral proximal third shaft fracture       Plan/ Recommendation:        Treatment:  Begin PT, can fly    The diagnosis and treatment options were discussed and reviewed in detail with the patient today who communicates understanding with the plan.         Zara Chess, MD [facility] Professor of Orthopaedic Surgery  Department of Orthopaedic Surgery  [place] [facility] [place]

## 2019-07-07 ENCOUNTER — Other Ambulatory Visit: Payer: Self-pay | Admitting: Physician Assistant

## 2019-07-07 DIAGNOSIS — R21 Rash and other nonspecific skin eruption: Secondary | ICD-10-CM

## 2019-07-16 ENCOUNTER — Other Ambulatory Visit: Payer: Self-pay | Admitting: Physician Assistant

## 2019-07-16 DIAGNOSIS — R053 Chronic cough: Secondary | ICD-10-CM

## 2019-07-18 ENCOUNTER — Telehealth: Payer: BLUE CROSS/BLUE SHIELD

## 2019-07-18 ENCOUNTER — Ambulatory Visit: Payer: PRIVATE HEALTH INSURANCE

## 2019-07-18 DIAGNOSIS — R6883 Chills (without fever): Secondary | ICD-10-CM

## 2019-07-18 DIAGNOSIS — K5900 Constipation, unspecified: Secondary | ICD-10-CM

## 2019-07-18 DIAGNOSIS — Z20822 Suspected COVID-19 virus infection: Secondary | ICD-10-CM

## 2019-07-18 DIAGNOSIS — W57XXXA Bitten or stung by nonvenomous insect and other nonvenomous arthropods, initial encounter: Secondary | ICD-10-CM

## 2019-07-18 DIAGNOSIS — R5383 Other fatigue: Secondary | ICD-10-CM

## 2019-07-18 DIAGNOSIS — R52 Pain, unspecified: Secondary | ICD-10-CM

## 2019-07-18 DIAGNOSIS — R110 Nausea: Secondary | ICD-10-CM

## 2019-07-19 LAB — COVID-19 PCR

## 2019-07-19 MED ORDER — DOXYCYCLINE HYCLATE 100 MG PO CAPS
200 mg | ORAL_CAPSULE | Freq: Once | ORAL | 0 refills | Status: AC
Start: 2019-07-19 — End: ?

## 2019-07-19 NOTE — Progress Notes
Patient Consent to Telehealth Questionnaire   [facility] TELEHEALTH PRECHECKIN QUESTIONS 07/18/2019   By clicking ''I Agree'', I consent to the below:  I Agree     - I agree  to be treated via a video visit and acknowledge that I may be liable for any relevant copays or coinsurance depending on my insurance plan.  - I understand that this video visit is offered for my convenience and I am able to cancel and reschedule for an in-person appointment if I desire.  - I also acknowledge that sensitive medical information may be discussed during this video visit appointment and that it is my responsibility to locate myself in a location that ensures privacy to my own level of comfort.  - I also acknowledge that I should not be participating in a video visit in a way that could cause danger to myself or to those around me (such as driving or walking).  If my provider is concerned about my safety, I understand that they have the right to terminate the visit.       [facility] COVID-19 (CLINIC) NOTE  Patient: Vanessa Terry  MRN: [id number]  Date of Service: 07/18/2019  Primary Care Physician: Brandy Hale., DO  CC: Concern for COVID-19 and associated symptoms    History of Present Illness:   Vanessa Terry is a 49 y.o. female presenting with concern for COVID-19 and associated symptoms.    Symptom history, exposure history, and other risk factors detailed below:    Symptoms started after traveling back from [place] on 5/17, where she pulled off lots of ticks during her visit. She c/o nausea and constipation and generally feeling unwell, so she had a COVID test done 5/18 which was negative. She had joint aches, fatigue and was sleeping 10-11 hours a day. She tried Miralax x 2 days with no effect, Dulcolax for 2 days with no effect and then last night ate a bowl of cherries, and she is still constipated. She feels better now than she did over the weekend (5/22 & 5/23), but she c/o a lump under the angle of left jaw. She is only eating dinner the past 2 days and feels somewhat better.     High Risk Symptoms  []  Cough  []  Fever  []  Shortness of Breath  [x]  GI symptoms (abdominal pain, diarrhea)    Risk Factors  []  Exposure to confirmed COVID-19 within 14 days prior to symptom onset.  []  Healthcare Worker  []  Immunocompromised or with high risk medical condition  []  60 years or older  []  Lives with others who are immunocompromised or with high risk medical condition  []  Group Living home (nursing home, dorms, etc)    Review of Systems  Check if present, blank if patient denies symptoms:  []  fever, [x]  chills, [x]  nausea, []  emesis, []  po intolerance, []  change in mentation, []  SOB, []  abdominal pain, [x]  any significant change in bowel function. All other systems negative except as documented in HPI.    History:     Current Outpatient Medications:   ???  PAROXETINE 10 mg tablet  ???  acetaminophen 325 mg tablet  ???  doxycycline 100 mg capsule  ???  ferrous gluconate 324 mg tablet  ???  prochlorperazine 10 mg tablet  Allergies   Allergen Reactions   ??? Amoxicillin      Headache   ??? Sulfamethoxazole-Trimethoprim      Eye swelling     Patient Active Problem List   Diagnosis   ???  Tinnitus of left ear   ??? Vertigo   ??? Coccyx pain   ??? Toe pain, left   ??? History of in vitro fertilization   ??? Abscess   ??? Hyperlipidemia, unspecified   ??? Mood changes   ??? Family history of colorectal cancer   ??? Family history of colon cancer requiring screening colonoscopy   ??? Depression   ??? Incontinence in female   ??? Closed displaced segmental fracture of shaft of right humerus       Past medical history, past surgical history, family history, and social history all reviewed, updated, and noted in HPI if significant.     Physical Exam:   (The following were examined and were normal unless noted otherwise)  Temp 37 ???C (98.6 ???F) (Oral) Comment: temp 102 F first time. Pt re -chech twice and average 98 F ~ LMP 07/13/2019    07/18/19 6:45 PM        Pulse   70???    Temp 37???C???(???98.6???F)???  36.2???C???(???97.2???F)???    Temp src  Oral???  Tympanic???    SpO2   99%         Gen  [x]  Appearance  [x]  Alert    Eyes  [x]  Conjunctiva  []  Pupils  [x]  Sclera   ENT  []  Oropharynx  []  Nasal     Neck  []  Inspect  [x]  Palpate  [x]  Lymph   Resp  [x]  Effort  []  Auscultate   []  Percuss    CV  [x]  Auscultate   []  No edema     Abd  []  BS  []  Palpation  []  HSM   Skin  [x] Color  []  Turgor  []  No rashes   Neuro  []  Oriented  [x]   Speech  [x]  Movement   Psych  [x]  Mood  [x]  Affect  [x]  Cognition     Pertinent + findings (if any):   Left submandibular lymphadenopathy, small and mobile    Assessment & Plan:   Vanessa Terry is a 48 y.o. female presenting with concern for COVID-19 and associated symptoms.    Diagnoses and all orders for this visit:    Body aches    Chills    Nausea    Other fatigue    Constipation, unspecified constipation type - fluids, prune juice, cherries, possibly try a Fleets Enema if no relief.     Tick bite, initial encounter  -     doxycycline 100 mg capsule; Take 2 capsules (200 mg total) by mouth once for 1 dose.    Suspected COVID-19 virus infection  -     COVID-19 PCR, Nasopharyngeal; Future      Orders Placed This Encounter   ??? COVID-19 PCR, Nasopharyngeal   ??? doxycycline 100 mg capsule     [x]  Patient referred for COVID testing.  []  Nasopharyngeal swab samples obtained for in clinic COVID testing.  []  Patient directed to go immediately to the local Emergency Department for further evaluation and possible hospitalization.  [] Patient provided with information regarding the need to isolate at home, social distancing, practice strict hygiene, and monitor for signs and symptoms of worsening health conditions including respiratory difficulties.  [] Additional education provided to patient on symptom management and follow up should they develop worsening symptoms including respiratory difficulties. Patient was also advised to inform healthcare providers and emergency responders of their current screening status should additional treatment be needed.  [] Risks/benefits of medications discussed.  [x]  >50% of this 20 mins visit was spent on  direct patient care, including counseling and/or coordination of care for the problems listed within my assessment and plan.    The plan of care, diagnosis, orders, risks and benefits related to the medical issues pertinent to this encounter, and follow-up recommendations were discussed with the patient. Questions related to the recommended plan of care were answered. See AVS for additional information and counseling materials provided to the patient.    Time of note filed does not necessarily reflect the time of encounter.  Portions of this note may have been created with voice recognition software. Occasional wrong-word or ''sound-alike'' substitutions may have occurred due to the inherent limitations of voice recognition software. Please read the chart carefully and recognize, using context, where these substitutions have occurred.  --  Author:  Ernst Spell, MD  07/18/2019 at 8:19 PM

## 2019-07-23 ENCOUNTER — Other Ambulatory Visit: Payer: Self-pay | Admitting: Physician Assistant

## 2019-07-23 DIAGNOSIS — F5101 Primary insomnia: Secondary | ICD-10-CM

## 2019-07-25 ENCOUNTER — Non-Acute Institutional Stay: Payer: BLUE CROSS/BLUE SHIELD | Attending: Orthopaedic Trauma

## 2019-07-25 DIAGNOSIS — S42361D Displaced segmental fracture of shaft of humerus, right arm, subsequent encounter for fracture with routine healing: Secondary | ICD-10-CM

## 2019-07-25 NOTE — Progress Notes
[facility]  07/25/2019    PATIENT: Vanessa Terry  MRN: [id number]  DOB: 1970/07/11  DATE OF SERVICE: 07/25/2019    REFERRING PRACTITIONER: No ref. provider found  PRIMARY CARE PROVIDER: Brandy Hale., DO    Chief Complaint:   Chief Complaint   Patient presents with   ??? Post Op Follow Up     right humerus         Subjective:       Semone Scherer is a 49 y.o. year-old female seen for follow-up of ORIF right humerus fracture on 06/15/2019. Patient doing well today, denies pain, fever, chills, wound issues. Has been NWB RUE    Course: stable, inproving    Radiology: interval callous formation and partial resolution of the lucency, no change in alignment or implant position         Objective:         PHYSICAL EXAM:  LMP 07/13/2019   comfortable, well-appearing, no apparent distress, alert, oriented and appropriate mood and affect  Wound healed. ROM right elbow 0-130, ABD 85 ERot 50 IR to S1       Assessment:       Impression: stable ORIF Segmental humeral proximal third shaft fracture with interval healing     Plan/ Recommendation:        Treatment:  Continue PT, 5lb weight bearing, RTC 6 weeks with new xrays    The diagnosis and treatment options were discussed and reviewed in detail with the patient today who communicates understanding with the plan.     No weights for 4 more weeks    I performed the service or was physically present during the critical or key portions of the service furnished by the resident: and I participated in the management of the patient.      Maryann Alar, MD

## 2019-07-25 NOTE — Telephone Encounter (Signed)
Last filled 05/16/2019 #30 with 5 refills.  She should not need refill yet.  Please sign denial.

## 2019-07-25 NOTE — Telephone Encounter (Signed)
Might be a pharmacy error. Patient should call pharmacy since she has 5 refills.

## 2019-07-31 ENCOUNTER — Other Ambulatory Visit: Payer: Self-pay | Admitting: Physician Assistant

## 2019-07-31 ENCOUNTER — Other Ambulatory Visit: Payer: Self-pay

## 2019-07-31 ENCOUNTER — Ambulatory Visit (INDEPENDENT_AMBULATORY_CARE_PROVIDER_SITE_OTHER): Payer: PRIVATE HEALTH INSURANCE | Admitting: Physician Assistant

## 2019-07-31 DIAGNOSIS — L4050 Arthropathic psoriasis, unspecified: Secondary | ICD-10-CM

## 2019-07-31 DIAGNOSIS — Z5329 Procedure and treatment not carried out because of patient's decision for other reasons: Secondary | ICD-10-CM

## 2019-07-31 DIAGNOSIS — F5101 Primary insomnia: Secondary | ICD-10-CM

## 2019-07-31 DIAGNOSIS — G8929 Other chronic pain: Secondary | ICD-10-CM

## 2019-07-31 MED ORDER — HYDROCODONE-ACETAMINOPHEN 5-325 MG PO TABS: ORAL_TABLET | ORAL | 0 refills | Status: DC

## 2019-07-31 NOTE — Telephone Encounter (Signed)
Pt's spouse called stating that pt had to cancel her appt today because she was in too much pain and was running a fever last night.  Spouse is requesting refill on pain medication until pt can be seen in 1 week.  Tiajuana Amass, CMA

## 2019-07-31 NOTE — Progress Notes (Signed)
No show

## 2019-08-07 ENCOUNTER — Other Ambulatory Visit: Payer: Self-pay

## 2019-08-07 ENCOUNTER — Encounter: Payer: Self-pay | Admitting: Physician Assistant

## 2019-08-07 ENCOUNTER — Ambulatory Visit (INDEPENDENT_AMBULATORY_CARE_PROVIDER_SITE_OTHER): Payer: PRIVATE HEALTH INSURANCE | Admitting: Physician Assistant

## 2019-08-07 ENCOUNTER — Ambulatory Visit (INDEPENDENT_AMBULATORY_CARE_PROVIDER_SITE_OTHER): Payer: PRIVATE HEALTH INSURANCE

## 2019-08-07 VITALS — BP 119/69 | HR 104 | Ht 64.0 in

## 2019-08-07 DIAGNOSIS — N3281 Overactive bladder: Secondary | ICD-10-CM

## 2019-08-07 DIAGNOSIS — L811 Chloasma: Secondary | ICD-10-CM | POA: Diagnosis not present

## 2019-08-07 DIAGNOSIS — Z86711 Personal history of pulmonary embolism: Secondary | ICD-10-CM

## 2019-08-07 DIAGNOSIS — L304 Erythema intertrigo: Secondary | ICD-10-CM | POA: Diagnosis not present

## 2019-08-07 DIAGNOSIS — J160 Chlamydial pneumonia: Secondary | ICD-10-CM

## 2019-08-07 MED ORDER — NYSTATIN 100000 UNIT/GM EX CREA: 1.0000 "application " | TOPICAL_CREAM | Freq: Two times a day (BID) | CUTANEOUS | 0 refills | Status: DC

## 2019-08-07 MED ORDER — OXYBUTYNIN CHLORIDE ER 10 MG PO TB24: 10.0000 mg | ORAL_TABLET | Freq: Every day | ORAL | 2 refills | Status: DC

## 2019-08-07 MED ORDER — FLUCONAZOLE 150 MG PO TABS: 150.0000 mg | ORAL_TABLET | Freq: Once | ORAL | 0 refills | Status: AC

## 2019-08-07 NOTE — Progress Notes (Signed)
Subjective:    Patient ID: Sarah Mayer, female    DOB: [birth year], 49 y.o.   MRN: [id number]  HPI   Patient is a 49 year old morbidly obese female with migraines, OSA, Barrett's esophagitis, psoriatic arthritis, hypothyroidism, hyperparathyroidism, chronic pain syndrome with recent ED visit for pneumonia and pulmonary embolism.  Her symptoms started while she was at the beach.  She came home and on 06/25/2019 she went to the hospital.  After full work-up suspected her pulmonary embolism came from sedentary lifestyle and obesity.  She was treated with antibiotics for her pneumonia.  She is feeling better but she still has a cough that is productive.  She is on Eliquis.  She continues to see rheumatology and has had to hold her Symphony injection due to her pneumonia and pulmonary embolism.  Her psoriatic arthritis pain has been worsening.  She is taking gabapentin, Norco, Trintellix with some benefit but not where she wants it to be.  Patient has not heard from the ketamine pain clinic that she was referred to in the spring.  Patient also had a concern while she was at the beach and in the sun her face turned dark spots everywhere.  She brings in a picture.  The darkness has subsided since she has not been in constant sun exposure.  She is not on any estrogen.  Patient does endorse that her overactive bladder symptoms have worsened recently.  She is having many more episodes of leaking and incontinence.  She is on 5 mg of Ditropan.  Insurance would not pay for Myrbetriq.  She does have an area behind her right knee that is red and inflamed.  She thinks this is yeast.  It is very itchy.  Patient does have a concern that she could be having primary focal seizures.  She had a friend that was diagnosed with this and her symptoms are exactly alike.  She wonders if this could be the case.  She has not seen neurology since November 2020.   .. Active Ambulatory Problems    Diagnosis Date Noted   . Psoriatic arthritis ([place]) 03/24/2016  . Gastroesophageal reflux disease 03/24/2016  . GAD (generalized anxiety disorder) 03/24/2016  . Iron deficiency anemia 03/24/2016  . History of pulmonary embolus (PE) 03/27/2016  . History of DVT (deep vein thrombosis) 03/27/2016  . Chronic tension-type headache, not intractable 03/27/2016  . Primary insomnia 03/27/2016  . Bilateral lower extremity edema 03/27/2016  . Chronic pain syndrome 05/25/2016  . Morbid (severe) obesity due to excess calories ([name]) 05/27/2016  . Serum calcium elevated 08/30/2016  . Diarrhea 08/30/2016  . Collagenous colitis 10/18/2016  . Barrett's esophagus without dysplasia 10/18/2016  . Arthralgia 10/18/2016  . Memory changes 11/23/2016  . Major depression, recurrent, chronic ([name]) 11/23/2016  . OSA (obstructive sleep apnea) 11/23/2016  . Twitching 12/22/2016  . Upper airway cough syndrome 12/22/2016  . Bilateral hip pain 12/22/2016  . Tripping over things 01/12/2017  . Left arm pain 01/12/2017  . Left elbow pain 01/12/2017  . Numbness and tingling in left arm 01/17/2017  . Word finding difficulty 03/15/2017  . Intractable migraine without aura and with status migrainosus 03/15/2017  . Edema of both feet 07/04/2017  . B12 deficiency 07/04/2017  . Acquired hypothyroidism 07/04/2017  . Vitamin D deficiency 07/04/2017  . Secondary hyperparathyroidism, non-renal ([place]) 07/04/2017  . Psoriasis 07/06/2017  . CFS (chronic fatigue syndrome) 07/06/2017  . Easy bruising 07/26/2017  . Myofascial pain 08/19/2017  . Itching 08/19/2017  . Yeast  infection 08/19/2017  . IBS (irritable bowel syndrome) 12/03/2017  . Recurrent falls 12/29/2017  . Weakness 02/11/2018  . 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 Urinary frequency 02/14/2018  . Chronic left shoulder pain 03/15/2018  . Chronic pain of left thumb 03/15/2018  . 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 Jerking 09/12/2018  . Episode of confusion 09/12/2018  . Right leg pain  11/14/2018  . Elevated serum creatinine 11/14/2018  . Dry mouth 11/14/2018  . Bilateral leg weakness 01/23/2019  . Skin infection 01/23/2019  . Chronic nonintractable headache 02/07/2019  . 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  . Intertrigo 08/11/2019  . Melasma 08/11/2019  . Pneumonia of left upper lobe due to Chlamydia species 08/11/2019  . OAB (overactive bladder) 08/11/2019   Resolved Ambulatory Problems    Diagnosis Date Noted  . Hyperparathyroidism, primary (HCC) 09/18/2016  . Rash and nonspecific skin eruption 10/18/2016  . Acute bronchitis 02/07/2018   Past Medical History:  Diagnosis Date  . History of pulmonary embolism 03/27/2016  . Microscopic colitis   . Migraine   . Morbid obesity (HCC) 05/27/2016        Review of Systems    see HPI.  Objective:   Physical Exam Vitals reviewed.  Constitutional:      Appearance: Normal appearance.     Comments: In wheelchair.   HENT:     Head: Normocephalic.  Cardiovascular:     Rate and Rhythm: Normal rate and regular rhythm.     Pulses: Normal pulses.  Pulmonary:     Effort: Pulmonary effort is normal.     Breath sounds: Normal breath sounds. No wheezing or rhonchi.  Abdominal:     Tenderness: There is no right CVA tenderness or left CVA tenderness.  Skin:    Comments: Erythematous macular moist appearance rash behind right knee.   Neurological:     General: No focal deficit present.     Mental Status: She is oriented to person, place, and time.  Psychiatric:        Mood and Affect: Mood normal.           Assessment & Plan:  Marland KitchenMarland KitchenAzka was seen today for follow-up.  Diagnoses and all orders for this visit:  Pneumonia of left upper lobe due to Chlamydia species -     DG Chest 2 View -     COMPLETE METABOLIC PANEL WITH GFR  History of pulmonary embolus (PE) -     COMPLETE METABOLIC PANEL WITH GFR  Intertrigo -     fluconazole (DIFLUCAN) 150 MG tablet; Take 1 tablet (150 mg total) by mouth once for 1  dose. Repeat in 48-72 hours if symptoms persist. -     nystatin cream (MYCOSTATIN); Apply 1 application topically 2 (two) times daily. -     COMPLETE METABOLIC PANEL WITH GFR  Melasma -     Cortisol, free, Serum -     ACTH -     COMPLETE METABOLIC PANEL WITH GFR  Morbid (severe) obesity due to excess calories (HCC)  OAB (overactive bladder) -     oxybutynin (DITROPAN-XL) 10 MG 24 hr tablet; Take 1 tablet (10 mg total) by mouth at bedtime.   We will get chest x-ray to make sure pneumonia has cleared.  She was given a prednisone pack by rheumatology since she had to miss her Symphony injection.  I suggested that she take this to help get rid of the residual inflammation.  If chest x-ray comes back with any signs of infection will treat with  another round of antibiotic.  Continue to use albuterol inhaler as needed to help open airways.  Stay on eliquis for pulmonary emobolism. Concern to come off of it since sedentary lifestyle was thought to blame.   Gave her information on the ketamine clinic and where we referred her to encourage her to call and make this appointment.  Discussed melasma at the beach.  Likely this is a benign finding.  We could check a ACTH and cortisol just to make sure there is no abnormal adrenal function.  Reassuring that her face has normalized since being out of the sun.  Her overactive bladder has increased.  We did go ahead and increase the discharge plan for her.  Follow-up in 1 to 2 months to see if there has been any improvement.  Nystatin and Diflucan given for the intertrigo yeast area behind her knees.  Follow-up as needed if not improving.  Encourage patient to make appointment with neurology if she would like to discuss symptoms that seem consistent with primary focal seizures.  Spent 30 minutes with patient.

## 2019-08-07 NOTE — Patient Instructions (Signed)
Melasma Melasma is a skin condition that causes areas of darker coloring. It usually appears in patches on the cheeks, forehead, upper lip, and neck. These patches can look like a mask. The discolored areas do not itch and are not red or swollen. Melasma is not contagious. This means that it does not spread from person to person. What are the causes? The cause of this condition is not known. However, it can be started by certain triggers, such as:  Being out in the sun.  Allergies to medicines or cosmetics.  Changes in your hormones, such as: ? Taking birth control medicines. ? Taking hormone replacement therapy. ? Being pregnant. What increases the risk? The following factors may make you more likely to develop this condition:  Being a woman. Melasma is less common in men.  Having a family history of melasma.  Having darker skin.  Living in a tropical climate. What are the signs or symptoms? The only symptom of this condition is dark or tan patches on the skin. How is this diagnosed? This condition is diagnosed based on:  A physical exam. Your health care provider will examine the physical appearance of your skin. He or she may use an ultraviolet light, called a Wood lamp, to look more closely at your skin.  Biopsy. A small sample of your skin is taken and examined under a microscope. This is done to make sure your melasma is not caused by another skin condition, such as skin cancer. How is this treated? There is no cure for this condition. However, there are treatments that may lighten the color of the darker patches. Treatment may include:  Medicines, such as bleaching or steroid creams.  Facial or chemical peels.  Laser treatment.  Dermabrasion or microdermabrasion. These procedures use fine instruments to scrape and remove the outer layer of skin in order to grow new, healthy-looking skin. Your melasma may also go away on its own over time. Follow these instructions at  home:  Lifestyle  Avoid overexposure to the sun, especially in tropical areas.  Wear sunscreen with an SPF of 30 or higher every day.  Wear a hat that protects your face from the sun.  Use gentle cosmetics that are meant for sensitive skin.  Do not use wax to remove excess hair in areas where you have or have had melasma. General instructions  Take or apply over-the-counter and prescription medicines only as told by your health care provider.  Keep all follow-up visits as told by your health care provider. This is important. Contact a health care provider if:  You have new symptoms.  Your symptoms get worse.  Your affected skin areas are: ? Bleeding. ? Irritated. Summary  Melasma is a skin condition that causes areas of darker coloring that do not itch and are not red or swollen.  The cause of this condition is not known. However, it can be started by certain triggers such as sun exposure, allergies to medicines or cosmetics, or changes in your hormones.  Risk factors include being a woman, having a family history of melasma, having darker skin, or living in a tropical climate.  There is no cure for this condition. However, there are treatments that may lighten the color of the darker patches. They include medicine, facial or chemical peels, laser treatment, dermabrasion, or microdermabrasion. This information is not intended to replace advice given to you by your health care provider. Make sure you discuss any questions you have with your health care provider.  Document Revised: 02/22/2017 Document Reviewed: 02/22/2017 Elsevier Patient Education  2020 [employer].

## 2019-08-08 NOTE — Progress Notes (Signed)
Sarah Mayer,   Lungs are clear. Start the prednisone from rheumatology. This could help with pain and also any residual lung inflammation.

## 2019-08-11 DIAGNOSIS — J160 Chlamydial pneumonia: Secondary | ICD-10-CM | POA: Insufficient documentation

## 2019-08-11 DIAGNOSIS — N3281 Overactive bladder: Secondary | ICD-10-CM | POA: Insufficient documentation

## 2019-08-11 DIAGNOSIS — L304 Erythema intertrigo: Secondary | ICD-10-CM | POA: Insufficient documentation

## 2019-08-11 DIAGNOSIS — L811 Chloasma: Secondary | ICD-10-CM | POA: Insufficient documentation

## 2019-08-14 ENCOUNTER — Ambulatory Visit: Payer: Commercial Managed Care - PPO

## 2019-08-16 ENCOUNTER — Encounter: Payer: Self-pay | Admitting: Physician Assistant

## 2019-08-17 ENCOUNTER — Encounter: Payer: Self-pay | Admitting: Physician Assistant

## 2019-08-18 ENCOUNTER — Ambulatory Visit: Payer: BLUE CROSS/BLUE SHIELD

## 2019-08-18 DIAGNOSIS — Z1329 Encounter for screening for other suspected endocrine disorder: Secondary | ICD-10-CM

## 2019-08-18 DIAGNOSIS — Z0110 Encounter for examination of ears and hearing without abnormal findings: Secondary | ICD-10-CM

## 2019-08-18 DIAGNOSIS — F329 Major depressive disorder, single episode, unspecified: Secondary | ICD-10-CM

## 2019-08-18 DIAGNOSIS — Z131 Encounter for screening for diabetes mellitus: Secondary | ICD-10-CM

## 2019-08-18 DIAGNOSIS — H6123 Impacted cerumen, bilateral: Secondary | ICD-10-CM

## 2019-08-18 DIAGNOSIS — E785 Hyperlipidemia, unspecified: Secondary | ICD-10-CM

## 2019-08-18 DIAGNOSIS — Z0000 Encounter for general adult medical examination without abnormal findings: Secondary | ICD-10-CM

## 2019-08-18 DIAGNOSIS — R0789 Other chest pain: Secondary | ICD-10-CM

## 2019-08-18 DIAGNOSIS — Z13220 Encounter for screening for lipoid disorders: Secondary | ICD-10-CM

## 2019-08-18 DIAGNOSIS — L989 Disorder of the skin and subcutaneous tissue, unspecified: Secondary | ICD-10-CM

## 2019-08-18 DIAGNOSIS — Z0100 Encounter for examination of eyes and vision without abnormal findings: Secondary | ICD-10-CM

## 2019-08-18 DIAGNOSIS — Z1389 Encounter for screening for other disorder: Secondary | ICD-10-CM

## 2019-08-18 MED ORDER — PAROXETINE HCL 10 MG PO TABS
ORAL_TABLET | 3 refills | Status: AC
Start: 2019-08-18 — End: ?

## 2019-08-18 NOTE — Progress Notes
PATIENT: Vanessa Terry  MRN: [id number]  DOB: 22-Nov-1970  DATE OF SERVICE: 08/18/2019    REFERRING PRACTITIONER: No ref. provider found  PRIMARY CARE PROVIDER: Brandy Hale., DO      CHIEF COMPLAINT:   Chief Complaint   Patient presents with   ??? Annual Exam        Subjective:      Vanessa Terry is a 49 y.o. female for a physical. Pt will do labs today for me and cardiologist.      Pt is seeing ortho for s/p repair of right humerus. Pt will start to do physical therapy per her. Pt states she has not had time to set up appt yet.      Pt has had anemia when broke her arm.  CBC will be rechecked today. Pt is not taking iron.     We discussed Tdap, and VIS given. Pt declines it and requests that reminder be removed as being due as she does not want it.      Pt unsure if will be getting COVID vaccine. Pt had COVID infection - she was symptomatic after Dad had it and died from it in her home.  He was in hospice.  Husband and children had symptoms too of COVID infection.      MRI breast  done 02/2019 - BIRADS 1    Pap 2020 and Neg with HPV  Neg.    Depression stable.  RX refilled.  NO SI or HI. Feels stable when taking medication.       Skin lesion nose 1 month. Wants to see Derm again.     LMP 08/08/2019 regular and usually lasts 3 days.  Trace blood in urine dip today.  No UTI symptoms.  Most likely from just finishing menses.     Past Medical History:   Diagnosis Date   ??? AK (actinic keratosis)    ??? Anemia    ??? Anxiety    ??? Coccyx pain    ??? Constipation    ??? Depression    ??? IBS (irritable bowel syndrome)    ??? Incontinence in female    ??? Tinnitus of left ear    ??? Vertigo      Past Surgical History:   Procedure Laterality Date   ??? CESAREAN SECTION  2014   ??? KNEE SURGERY  1994    in [place], arthroscopic   ??? LAPAROSCOPY DIAGNOSTIC / BIOPSY / ASPIRATION / LYSIS  2011   ??? MY[facility]ECTOMY  2012    [facility]   ??? OVARY SURGERY  2011   ??? REMOVAL OF R OVARIAN CYST  2011    [facility]    ??? right humerus fracture Right 2021 Family History   Problem Relation Age of Onset   ??? Aortic stenosis Mother         s/p AV valve replacement   ??? Atrial fibrillation Mother         deceased from cardiosynch procedure   ??? Breast cancer Mother 28   ??? Alcohol abuse Mother    ??? Arthritis Mother    ??? Depression Mother    ??? Hyperlipidemia Mother    ??? Hypertension Mother    ??? Stroke Mother    ??? Skin cancer Mother    ??? Other (myocardial infarction) Mother    ??? Aortic stenosis Cousin         s/p AV valve replacement   ??? Meniere's disease Other  niece   ??? Colon cancer Maternal Uncle 43   ??? Rectal cancer Sister 94   ??? Depression Sister    ??? Alcohol abuse Sister    ??? Arthritis Sister    ??? Atrial fibrillation Father    ??? Heart failure Father    ??? Melanoma Father    ??? Alcohol abuse Father    ??? Arthritis Father    ??? Diabetes type II Father    ??? Hyperlipidemia Father    ??? Hypertension Father    ??? Kidney disease Father    ??? Learning disabilities Father    ??? Stroke Father    ??? Other (myocardial infarction) Father    ??? Other (COVID infection cause of death.) Father    ??? Hypertension Brother    ??? Learning disabilities Brother    ??? Alcohol abuse Brother    ??? Arthritis Brother    ??? Arthritis Paternal Grandmother    ??? Alcohol abuse Paternal Grandfather    ??? Arthritis Paternal Grandfather    ??? Alcohol abuse Sister    ??? Arthritis Sister    ??? Diabetes type II Brother    ??? Arthritis Brother    ??? Other (congenital cataract) Daughter    ??? Club foot Niece    ??? Other (aortic valve replaced) Maternal Cousin      Social History     Socioeconomic History   ??? Marital status: Married     Spouse name: Not on file   ??? Number of children: 2   ??? Years of education: Not on file   ??? Highest education level: Not on file   Occupational History   ??? Occupation: Homemaker   ??? Occupation: Education officer, environmental   Social Needs   ??? Financial resource strain: Not on file   ??? Food insecurity     Worry: Not on file     Inability: Not on file   ??? Transportation needs     Medical: Not on file     Non-medical: Not on file   Tobacco Use   ??? Smoking status: Former Smoker   ??? Smokeless tobacco: Never Used   ??? Tobacco comment: quit 10 yrs ago, smoked socially x15 yrs   Substance and Sexual Activity   ??? Alcohol use: No     Alcohol/week: 0.0 oz   ??? Drug use: No   ??? Sexual activity: Yes     Partners: Male     Birth control/protection: None   Lifestyle   ??? Physical activity     Days per week: 4 days     Minutes per session: 30 min   ??? Stress: Rather much   Relationships   ??? Social Wellsite geologist on phone: Not on file     Gets together: Not on file     Attends religious service: Not on file     Active member of club or organization: Not on file     Attends meetings of clubs or organizations: Not on file     Relationship status: Not on file   Other Topics Concern   ??? Not on file   Social History Narrative    Home: [place] and 2 kids (twins); originally from [place], then [place] x15 yrs moved here in 2010; recent changes: no changes other than potentially moving dad to come live with her (in [place]) as she is DPOA and managing his health care, going back and forth to [place] to are  for dad;  living situation and social supports: good    Family and Relationships: sister lives in [place], one in [place], 2 brothers in [place], dad is in [place] as above. Husband and kids as above.     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  Yoga in the past.      Supplement: folic acid MVI  B12    Diet: Regular.      Medications that the patient states to be currently taking   Medication Sig   ??? PARoxetine 10 mg tablet TAKE 1 TABLET BY MOUTH EVERY DAY.   ??? [DISCONTINUED] PAROXETINE 10 mg tablet TAKE 1 TABLET BY MOUTH EVERY DAY      Allergies   Allergen Reactions   ??? Amoxicillin      Headache   ??? Sulfamethoxazole-Trimethoprim      Eye swelling       Reviewed PMHx, PSHX, SocHx etc with the patient and have documented it in the chart as above.    Review of Systems:   A 14-system review of systems was performed and is negative except as stated in the history of present illness.     Objective:        Vitals:  BP 106/70  ~ Pulse 52  ~ Temp 36.4 ???C (97.5 ???F) (Temporal)  ~ Resp 16  ~ Ht 5' 5'' (1.651 m)  ~ Wt 151 lb (68.5 kg)  ~ SpO2 98%  ~ BMI 25.13 kg/m???    General:   alert, appears stated age and cooperative   Skin:   possible AK on nose tip.  Scar on the right upper arm.  Healed   Head:   Normocephalic, without obvious abnormality, atraumatic   Eyes:   negative findings: lids and lashes normal, conjunctivae and sclerae normal, corneas clear and pupils equal, round, reactive to light and accomodation   Ears:   Bilateral cerumen.    Nose:  Nares normal. Septum midline. Mucosa normal. No drainage or sinus tenderness.   Throat:  lips, mucosa, and tongue normal; teeth and gums normal   Mouth:   No perioral or gingival cyanosis or lesions.  Tongue is normal in appearance.   Neck:  no adenopathy, no carotid bruit, no JVD, supple, symmetrical, trachea midline and thyroid not enlarged, symmetric, no tenderness/mass/nodules   Back:  symmetric, no curvature. ROM normal. No CVA tenderness.   Lungs:   clear to auscultation bilaterally   Heart:   regular rate and rhythm, S1, S2 normal, no murmur, click, rub or gallop   Abdomen:   soft, non-tender; bowel sounds normal; no masses,  no organomegaly   GU:   defer exam  Not due for mammogram or pap   Musculoskeletal:  negative, Spine ROM normal. Muscular strength intact.   Extremities:  extremities normal, atraumatic, no cyanosis or edema   Pulses:       2+ and symmetric   Lymph Nodes:  cervical, supraclavicular, and axillary nodes normal.   Neurologic:   Alert and oriented X 3, normal strength and tone. Normal symmetric reflexes. Normal coordination and gait   Psychiatric:   oriented to time, place and person, mood and affect are within normal limits      Hearing Screening    125Hz  250Hz  500Hz  1000Hz  2000Hz  3000Hz  4000Hz  6000Hz  8000Hz    Right ear:  15 20 10 15 15 20 20    Left ear:   15 15 15 5 10 15 20       Visual Acuity Screening    Right eye Left eye Both eyes   Without correction:      With correction: 20/20 20/20 20/20        No flowsheet data found.  PH-9 TOTAL SCORE [place] 12/04/2014 04/22/2018 11/15/2018   Total Score 12 4 3    Some encounter information is confidential and restricted. Go to Review Flowsheets activity to see all data.         Lab Review:   Lab Results   Component Value Date    NA 139 06/13/2019    K 4.1 06/13/2019    CL 104 06/13/2019    CO2 23 06/13/2019    BUN 14 06/13/2019    CALCIUM 9.1 06/13/2019    ALKPHOS 49 06/13/2019    AST 24 06/13/2019    ALT 22 06/13/2019    BILITOT 0.4 06/13/2019    GLUCOSE 80 06/13/2019     Lab Results   Component Value Date    WBC 12.86 (H) 06/16/2019    HGB 8.4 (L) 06/16/2019    HCT 25.0 (L) 06/16/2019    PLT 233 06/16/2019    RBC 2.68 (L) 06/16/2019    MCV 93.3 06/16/2019     Lab Results   Component Value Date    CHOL 220 08/18/2018    CHOLDLCAL 156 (H) 08/18/2018    CHOLHDL 49 (L) 08/18/2018    TRIGLY 74 08/18/2018    NOHDLCHOCAL 171 (H) 08/18/2018              Assessment & Plan:        1. Well adult exam    2. Screening for endocrine disorder  - CBC & Auto Differential; Future  - TSH with reflex FT4, FT3; Future  - CBC & Auto Differential  - TSH with reflex FT4, FT3    3. Diabetes mellitus screening  - Hgb A1c; Future  - Hgb A1c    4. Screening for hyperlipidemia  - Comprehensive Metabolic Panel; Future  - Comprehensive Metabolic Panel    5. Routine eye exam  - TBOC - Visual Acuity Screening    6. Encounter for hearing examination, unspecified whether abnormal findings  - TBOC - Screening Audiometry    7. Screening for hematuria or proteinuria  - POCT urinalysis dipstick; Future  - POCT urinalysis dipstick    8. Depression, unspecified depression type  - PARoxetine 10 mg tablet; TAKE 1 TABLET BY MOUTH EVERY DAY. Dispense: 90 tablet; Refill: 3    9. Skin lesion of face  - Referral to Dermatology    10. Excessive cerumen in both ear canals  - Referral to Surgery, Head & Neck    11. Atypical chest pain  - Lipid Panel  - hsCRP (Cardio CRP) and CV Risk    12. Dyslipidemia  - Lipid Panel  - hsCRP (Cardio CRP) and CV Risk    Referral ENT - pt requests ENT to remove her cerumen  Referral Derm - Skin lesion on her nose.    Pt will do labs for cardiologist.  Pt will do labs today. Fasting today.  Pt will continue with med for depression  Pt will follow up with ortho and PT.          Follow up: prn       Immunization status: Refused Tdap.  Pt unsure if will get  COVID vaccine.     I spent a total of 30 minutes face to face with the patient of which greater than 50% of that time was spent in counseling/coordination.  Topics of my discussion are in my note.      The above plan of care, diagnosis, orders, and follow-up were discussed with the patient.  Questions related to this recommended plan of care were answered.       Author:  Brandy Hale, DO 08/18/2019 11:19 AM

## 2019-08-19 LAB — Differential Automated: ABSOLUTE BASO COUNT: 0.04 10*3/uL (ref 0.00–0.10)

## 2019-08-19 LAB — hsCRP (Cardio CRP) and CV Risk: HSCRP (CARDIO CRP) AND CV RISK: 0.4 mg/L

## 2019-08-19 LAB — Hgb A1c: HGB A1C - HPLC: 5.2 (ref <5.7)

## 2019-08-19 LAB — Comprehensive Metabolic Panel
BILIRUBIN,TOTAL: 0.3 mg/dL (ref 0.1–1.2)
POTASSIUM: 4 mmol/L (ref 3.6–5.3)

## 2019-08-19 LAB — CBC: HEMATOCRIT: 40.8 (ref 34.9–45.2)

## 2019-08-19 LAB — Lipid Panel: CHOLESTEROL,LDL,CALCULATED: 192 mg/dL — ABNORMAL HIGH (ref <100)

## 2019-08-19 LAB — TSH with reflex FT4, FT3: TSH: 1.5 u[IU]/mL (ref 0.3–4.7)

## 2019-08-20 ENCOUNTER — Encounter: Payer: Self-pay | Admitting: Physician Assistant

## 2019-08-21 NOTE — Telephone Encounter (Signed)
Was the ketamine clinic contacted? If so can you get patient info to call them to see about scheduling.

## 2019-08-22 ENCOUNTER — Encounter: Payer: Self-pay | Admitting: Physician Assistant

## 2019-08-23 ENCOUNTER — Non-Acute Institutional Stay: Payer: PRIVATE HEALTH INSURANCE | Attending: Orthopaedic Trauma

## 2019-08-23 ENCOUNTER — Telehealth: Payer: BLUE CROSS/BLUE SHIELD

## 2019-08-23 DIAGNOSIS — S42341D Displaced spiral fracture of shaft of humerus, right arm, subsequent encounter for fracture with routine healing: Secondary | ICD-10-CM

## 2019-08-23 NOTE — Telephone Encounter
LM for pt to return my call regarding appointment with Dr. Sampson Goon for 7/6 at [facility]. Please transfer call to [facility]

## 2019-08-23 NOTE — Progress Notes
[facility]  08/23/2019    PATIENT: Vanessa Terry  MRN: [id number]  DOB: 08/02/1970  DATE OF SERVICE: 08/23/2019    REFERRING PRACTITIONER: No ref. provider found  PRIMARY CARE PROVIDER: Brandy Hale., DO    Chief Complaint:   Chief Complaint   Patient presents with   ??? Right Shoulder - Follow-up         Subjective:       Vanessa Terry is a 49 y.o. year-old female seen for follow-up of ORIF right humerus fracture on 06/15/2019. Patient doing well today, denies pain, fever, chills, wound issues.     Has started using the shoulder more but avoiding heavy objects. Has not started any PT and has not done much stretching. She denies any numbness, tingling.     Course: stable, inproving    Radiology: interval callous formation and partial resolution of the lucency, no change in alignment or implant position         Objective:         PHYSICAL EXAM:  There were no vitals taken for this visit.  comfortable, well-appearing, no apparent distress, alert, oriented and appropriate mood and affect  Wound healed.    ROM right elbow 0-130  Shoulder rom: FLEXION to 150,  ABD 90, ER to 45, IR to S1  Sensation intact in m/r/u        Assessment:       Impression: Now 2 months post op stable ORIF Segmental humeral proximal third shaft fracture with interval healing. Having some stiffness of the shoulder.      Plan/ Recommendation:        Treatment:  Continue home exercises and recommend starting formal PT to help her ROM    Ok to start weight bearing as tolerated and progress as tolerated with her activities      The diagnosis and treatment options were discussed and reviewed in detail with the patient today who communicates understanding with the plan.       RTC in 6 months with new XR       I performed the service or was physically present during the critical or key portions of the service furnished by the resident: and I participated in the management of the patient.      Maryann Alar, MD

## 2019-08-23 NOTE — Telephone Encounter (Signed)
Could we try [facility] is she excepting patients.

## 2019-08-25 NOTE — Telephone Encounter (Signed)
Is any pain clinic accepting referrals and see to want to take patients?

## 2019-08-27 ENCOUNTER — Ambulatory Visit: Payer: BLUE CROSS/BLUE SHIELD

## 2019-08-30 ENCOUNTER — Encounter: Payer: Self-pay | Admitting: Physician Assistant

## 2019-08-30 DIAGNOSIS — G894 Chronic pain syndrome: Secondary | ICD-10-CM

## 2019-08-30 DIAGNOSIS — M797 Fibromyalgia: Secondary | ICD-10-CM

## 2019-09-01 NOTE — Telephone Encounter (Signed)
Ok for referral here.

## 2019-09-01 NOTE — Telephone Encounter (Signed)
Referral placed.

## 2019-09-02 ENCOUNTER — Encounter: Payer: Self-pay | Admitting: Physician Assistant

## 2019-09-02 DIAGNOSIS — Z86711 Personal history of pulmonary embolism: Secondary | ICD-10-CM

## 2019-09-04 MED ORDER — ELIQUIS 5 MG PO TABS: 5.0000 mg | ORAL_TABLET | Freq: Two times a day (BID) | ORAL | 5 refills | Status: DC

## 2019-09-09 ENCOUNTER — Encounter: Payer: Self-pay | Admitting: Physician Assistant

## 2019-09-12 ENCOUNTER — Telehealth (INDEPENDENT_AMBULATORY_CARE_PROVIDER_SITE_OTHER): Payer: PRIVATE HEALTH INSURANCE | Admitting: Physician Assistant

## 2019-09-12 VITALS — Ht 64.0 in | Wt 364.0 lb

## 2019-09-12 DIAGNOSIS — L4050 Arthropathic psoriasis, unspecified: Secondary | ICD-10-CM

## 2019-09-12 DIAGNOSIS — R059 Cough, unspecified: Secondary | ICD-10-CM

## 2019-09-12 DIAGNOSIS — R05 Cough: Secondary | ICD-10-CM

## 2019-09-12 DIAGNOSIS — G894 Chronic pain syndrome: Secondary | ICD-10-CM

## 2019-09-12 DIAGNOSIS — G9332 Myalgic encephalomyelitis/chronic fatigue syndrome: Secondary | ICD-10-CM

## 2019-09-12 DIAGNOSIS — J209 Acute bronchitis, unspecified: Secondary | ICD-10-CM

## 2019-09-12 DIAGNOSIS — R5382 Chronic fatigue, unspecified: Secondary | ICD-10-CM

## 2019-09-12 DIAGNOSIS — Z8701 Personal history of pneumonia (recurrent): Secondary | ICD-10-CM

## 2019-09-12 DIAGNOSIS — R29898 Other symptoms and signs involving the musculoskeletal system: Secondary | ICD-10-CM

## 2019-09-12 MED ORDER — HYDROCOD POLST-CPM POLST ER 10-8 MG/5ML PO SUER: 5.0000 mL | Freq: Two times a day (BID) | ORAL | 0 refills | Status: DC | PRN

## 2019-09-12 MED ORDER — DOXYCYCLINE HYCLATE 100 MG PO TABS: 100.0000 mg | ORAL_TABLET | Freq: Two times a day (BID) | ORAL | 0 refills | Status: DC

## 2019-09-12 MED ORDER — ALBUTEROL SULFATE HFA 108 (90 BASE) MCG/ACT IN AERS: 2.0000 | INHALATION_SPRAY | Freq: Four times a day (QID) | RESPIRATORY_TRACT | 1 refills | Status: DC | PRN

## 2019-09-12 NOTE — Progress Notes (Signed)
Cough - started 08/28/2019 Worse at night, some spells during the day 08/28/19, 09/08/2019, last night- coughing so bad during the night that she threw up  (phlegm) Throat - little scratchy but no other symptoms  Taking allergy meds, no mucinex or any other new OTC meds for cough

## 2019-09-12 NOTE — Progress Notes (Signed)
Patient ID: Sarah Mayer, female   DOB: October 15, 1970, 49 y.o.   MRN: [id number] .Marland KitchenVirtual Visit via Video Note  I connected with Sarah Mayer on 09/12/19 at  4:00 PM EDT by a video enabled telemedicine application and verified that I am speaking with the correct person using two identifiers.  Location: Patient: home Provider: clinic   I discussed the limitations of evaluation and management by telemedicine and the availability of in person appointments. The patient expressed understanding and agreed to proceed.  History of Present Illness: Pt is a 49 yo morbidly obese female with decreased ambulation and recent hx of PE/pneumonia in may who presents to the clinic with worsening cough. She was feeling a lot better but about 2 weeks ago the cough started and continues to worsen. She is now feeling weaker and weaker. Cough kept her up all night last night and caused her to vomit at times. 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 No other medication changes. No known fever but has felt like some chills. She does have some chest pain. She is taking allergy medications but no OTC mucinex.   She did not like referral in [place] for pool PT. She would like another referral.    .. Active Ambulatory Problems    Diagnosis Date Noted  . Psoriatic arthritis (HCC) 03/24/2016  . Gastroesophageal reflux disease 03/24/2016  . GAD (generalized anxiety disorder) 03/24/2016  . Iron deficiency anemia 03/24/2016  . History of pulmonary embolus (PE) 03/27/2016  . History of DVT (deep vein thrombosis) 03/27/2016  . Chronic tension-type headache, not intractable 03/27/2016  . Primary insomnia 03/27/2016  . Bilateral lower extremity edema 03/27/2016  . Chronic pain syndrome 05/25/2016  . Morbid (severe) obesity due to excess calories (HCC) 05/27/2016  . Serum calcium elevated 08/30/2016  . Diarrhea 08/30/2016  . Collagenous colitis 10/18/2016  .  Barrett's esophagus without dysplasia 10/18/2016  . Arthralgia 10/18/2016  . Memory changes 11/23/2016  . Major depression, recurrent, chronic (HCC) 11/23/2016  . OSA (obstructive sleep apnea) 11/23/2016  . Twitching 12/22/2016  . Upper airway cough syndrome 12/22/2016  . Bilateral hip pain 12/22/2016  . Tripping over things 01/12/2017  . Left arm pain 01/12/2017  . Left elbow pain 01/12/2017  . Numbness and tingling in left arm 01/17/2017  . Word finding difficulty 03/15/2017  . Intractable migraine without aura and with status migrainosus 03/15/2017  . Edema of both feet 07/04/2017  . B12 deficiency 07/04/2017  . Acquired hypothyroidism 07/04/2017  . Vitamin D deficiency 07/04/2017  . Secondary hyperparathyroidism, non-renal (HCC) 07/04/2017  . Psoriasis 07/06/2017  . CFS (chronic fatigue syndrome) 07/06/2017  . Easy bruising 07/26/2017  . Myofascial pain 08/19/2017  . Itching 08/19/2017  . Yeast infection 08/19/2017  . IBS (irritable bowel syndrome) 12/03/2017  . Recurrent falls 12/29/2017  . Weakness 02/11/2018  . 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 Urinary frequency 02/14/2018  . Chronic left shoulder pain 03/15/2018  . Chronic pain of left thumb 03/15/2018  . 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 Jerking 09/12/2018  . Episode of confusion 09/12/2018  . Right leg pain 11/14/2018  . Elevated serum creatinine 11/14/2018  . Dry mouth 11/14/2018  . Bilateral leg weakness 01/23/2019  . Skin infection 01/23/2019  . Chronic nonintractable headache 02/07/2019  . 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  . Intertrigo 08/11/2019  . Melasma 08/11/2019  . Pneumonia of left upper lobe due to Chlamydia species 08/11/2019  . OAB (overactive bladder) 08/11/2019   Resolved Ambulatory Problems  Diagnosis Date Noted  . Hyperparathyroidism, primary (HCC) 09/18/2016  . Rash and nonspecific skin eruption 10/18/2016  . Acute bronchitis 02/07/2018   Past Medical History:  Diagnosis Date  . History  of pulmonary embolism 03/27/2016  . Microscopic colitis   . Migraine   . Morbid obesity (HCC) 05/27/2016   Reviewed med, allergy, problem list.    Observations/Objective: No acute distress. Productive cough on exam.  No labored breathing or active wheezing.  No rhinorrhea.  .. Today's Vitals   09/12/19 1559  Weight: (!) 364 lb (165.1 kg)  Height: 5\' 4"  (1.626 m)   Body mass index is 62.48 kg/m.    Assessment and Plan: [name] was seen today for cough.  Diagnoses and all orders for this visit:  Acute bronchitis, unspecified organism -     doxycycline (VIBRA-TABS) 100 MG tablet; Take 1 tablet (100 mg total) by mouth 2 (two) times daily. -     chlorpheniramine-HYDROcodone (TUSSIONEX) 10-8 MG/5ML SUER; Take 5 mLs by mouth every 12 (twelve) hours as needed. -     albuterol (VENTOLIN HFA) 108 (90 Base) MCG/ACT inhaler; Inhale 2 puffs into the lungs every 6 (six) hours as needed for wheezing or shortness of breath.  Cough -     chlorpheniramine-HYDROcodone (TUSSIONEX) 10-8 MG/5ML SUER; Take 5 mLs by mouth every 12 (twelve) hours as needed. -     albuterol (VENTOLIN HFA) 108 (90 Base) MCG/ACT inhaler; Inhale 2 puffs into the lungs every 6 (six) hours as needed for wheezing or shortness of breath.  History of pneumonia -     doxycycline (VIBRA-TABS) 100 MG tablet; Take 1 tablet (100 mg total) by mouth 2 (two) times daily.  CFS (chronic fatigue syndrome) -     Ambulatory referral to Physical Therapy  Bilateral leg weakness -     Ambulatory referral to Physical Therapy  Psoriatic arthritis (HCC) -     Ambulatory referral to Physical Therapy  Chronic pain syndrome -     Ambulatory referral to Physical Therapy  Morbid (severe) obesity due to excess calories (HCC) -     Ambulatory referral to Physical Therapy   Pt is on prednisone. Concerned about pneumonia developing again in her immunocomprised status. She does not have albuterol inhaler so sent and encouraged to use. Start  doxycycline. tussionex for cough at bedtime.    Follow Up Instructions:    I discussed the assessment and treatment plan with the patient. The patient was provided an opportunity to ask questions and all were answered. The patient agreed with the plan and demonstrated an understanding of the instructions.   The patient was advised to call back or seek an in-person evaluation if the symptoms worsen or if the condition fails to improve as anticipated.  I provided 11 minutes of non-face-to-face time during this encounter.   Belenda Cruise, PA-C

## 2019-09-14 ENCOUNTER — Encounter: Payer: Self-pay | Admitting: Physician Assistant

## 2019-09-18 ENCOUNTER — Other Ambulatory Visit: Payer: Self-pay | Admitting: Physician Assistant

## 2019-09-18 DIAGNOSIS — R059 Cough, unspecified: Secondary | ICD-10-CM

## 2019-09-19 ENCOUNTER — Other Ambulatory Visit: Payer: Self-pay

## 2019-09-19 ENCOUNTER — Ambulatory Visit (INDEPENDENT_AMBULATORY_CARE_PROVIDER_SITE_OTHER): Payer: PRIVATE HEALTH INSURANCE

## 2019-09-19 ENCOUNTER — Other Ambulatory Visit: Payer: Self-pay | Admitting: Physician Assistant

## 2019-09-19 DIAGNOSIS — K2100 Gastro-esophageal reflux disease with esophagitis, without bleeding: Secondary | ICD-10-CM

## 2019-09-19 DIAGNOSIS — R059 Cough, unspecified: Secondary | ICD-10-CM

## 2019-09-19 DIAGNOSIS — R05 Cough: Secondary | ICD-10-CM

## 2019-09-21 ENCOUNTER — Other Ambulatory Visit: Payer: Self-pay | Admitting: Physician Assistant

## 2019-09-25 ENCOUNTER — Encounter: Payer: Self-pay | Admitting: Physician Assistant

## 2019-09-25 DIAGNOSIS — L4050 Arthropathic psoriasis, unspecified: Secondary | ICD-10-CM

## 2019-09-25 DIAGNOSIS — G9332 Myalgic encephalomyelitis/chronic fatigue syndrome: Secondary | ICD-10-CM

## 2019-09-25 DIAGNOSIS — G8929 Other chronic pain: Secondary | ICD-10-CM

## 2019-09-25 DIAGNOSIS — R5382 Chronic fatigue, unspecified: Secondary | ICD-10-CM

## 2019-09-25 NOTE — Progress Notes (Signed)
CXR clear. No acute changes. How are you feeling today?

## 2019-09-29 MED ORDER — HYDROCODONE-ACETAMINOPHEN 5-325 MG PO TABS: ORAL_TABLET | ORAL | 0 refills | Status: DC

## 2019-09-29 MED ORDER — AMPHETAMINE-DEXTROAMPHET ER 30 MG PO CP24: 30.0000 mg | ORAL_CAPSULE | ORAL | 0 refills | Status: DC

## 2019-09-29 NOTE — Telephone Encounter (Signed)
..  PDMP reviewed during this encounter. Refilled adderall and norco.  Pt just seen in clinic.

## 2019-10-01 MED ORDER — AMPHETAMINE-DEXTROAMPHET ER 30 MG PO CP24: 30.0000 mg | ORAL_CAPSULE | ORAL | 0 refills | Status: DC

## 2019-10-01 NOTE — Addendum Note (Signed)
Addended by: Jomarie Longs on: 10/01/2019 10:04 PM   Modules accepted: Orders

## 2019-10-05 ENCOUNTER — Telehealth: Payer: Self-pay

## 2019-10-05 NOTE — Telephone Encounter (Signed)
Prior auth submitted for Adderall on 10/04/19 through Cover My Meds

## 2019-10-12 ENCOUNTER — Encounter: Payer: Self-pay | Admitting: Physician Assistant

## 2019-10-16 ENCOUNTER — Other Ambulatory Visit: Payer: Self-pay | Admitting: Physician Assistant

## 2019-10-16 DIAGNOSIS — L304 Erythema intertrigo: Secondary | ICD-10-CM

## 2019-10-18 ENCOUNTER — Encounter: Payer: Self-pay | Admitting: Physician Assistant

## 2019-10-18 ENCOUNTER — Telehealth (INDEPENDENT_AMBULATORY_CARE_PROVIDER_SITE_OTHER): Payer: PRIVATE HEALTH INSURANCE | Admitting: Physician Assistant

## 2019-10-18 VITALS — Ht 64.0 in | Wt 365.0 lb

## 2019-10-18 DIAGNOSIS — R053 Chronic cough: Secondary | ICD-10-CM

## 2019-10-18 DIAGNOSIS — J209 Acute bronchitis, unspecified: Secondary | ICD-10-CM

## 2019-10-18 DIAGNOSIS — Z86711 Personal history of pulmonary embolism: Secondary | ICD-10-CM

## 2019-10-18 DIAGNOSIS — R05 Cough: Secondary | ICD-10-CM

## 2019-10-18 DIAGNOSIS — Z8701 Personal history of pneumonia (recurrent): Secondary | ICD-10-CM

## 2019-10-18 DIAGNOSIS — R059 Cough, unspecified: Secondary | ICD-10-CM

## 2019-10-18 MED ORDER — PREDNISONE 20 MG PO TABS: ORAL_TABLET | ORAL | 0 refills | Status: DC

## 2019-10-18 MED ORDER — LEVOFLOXACIN 500 MG PO TABS: 500.0000 mg | ORAL_TABLET | Freq: Every day | ORAL | 0 refills | Status: DC

## 2019-10-18 MED ORDER — HYDROCOD POLST-CPM POLST ER 10-8 MG/5ML PO SUER: 5.0000 mL | Freq: Two times a day (BID) | ORAL | 0 refills | Status: DC | PRN

## 2019-10-18 NOTE — Progress Notes (Signed)
Patient ID: Sarah Mayer, female   DOB: Mar 24, 1970, 49 y.o.   MRN: [id number] .Marland KitchenVirtual Visit via Video Note  I connected with [name] on 10/18/19 at  1:20 PM EDT by a video enabled telemedicine application and verified that I am speaking with the correct person using two identifiers.  Location: Patient: home Provider: clinic   I discussed the limitations of evaluation and management by telemedicine and the availability of in person appointments. The patient expressed understanding and agreed to proceed.  History of Present Illness: [name] who is immunocompromised with recurrent infections who calls in with 7 days of fever, chills, sweats, cough, dizziness, nausea. Her cough is "wearing her out". Her husband was recently sick and tested negative for covid. She has her vaccine. She is taking OTC cough medications and tylenol but does not help. Last 2 days she has not ran a fever but cough is much worse. At times she coughs all day. Hx of persistent cough treated with treating GERD/Asthma/PND/allergies. She is using albuterol inhaler with no much relief. No other current sick contacts but she does not leave the house much.   This summer she had pneumonia and pulmonary embolism. She is on anti-coagulant.   .. Active Ambulatory Problems    Diagnosis Date Noted  . Psoriatic arthritis (HCC) 03/24/2016  . Gastroesophageal reflux disease 03/24/2016  . GAD (generalized anxiety disorder) 03/24/2016  . Iron deficiency anemia 03/24/2016  . History of pulmonary embolus (PE) 03/27/2016  . History of DVT (deep vein thrombosis) 03/27/2016  . Chronic tension-type headache, not intractable 03/27/2016  . Primary insomnia 03/27/2016  . Bilateral lower extremity edema 03/27/2016  . Chronic pain syndrome 05/25/2016  . Morbid (severe) obesity due to excess calories (HCC) 05/27/2016  . Serum calcium elevated 08/30/2016  . Diarrhea 08/30/2016  . Collagenous colitis 10/18/2016   . Barrett's esophagus without dysplasia 10/18/2016  . Arthralgia 10/18/2016  . Memory changes 11/23/2016  . Major depression, recurrent, chronic (HCC) 11/23/2016  . OSA (obstructive sleep apnea) 11/23/2016  . Twitching 12/22/2016  . Upper airway cough syndrome 12/22/2016  . Bilateral hip pain 12/22/2016  . Tripping over things 01/12/2017  . Left arm pain 01/12/2017  . Left elbow pain 01/12/2017  . Numbness and tingling in left arm 01/17/2017  . Word finding difficulty 03/15/2017  . Intractable migraine without aura and with status migrainosus 03/15/2017  . Edema of both feet 07/04/2017  . B12 deficiency 07/04/2017  . Acquired hypothyroidism 07/04/2017  . Vitamin D deficiency 07/04/2017  . Secondary hyperparathyroidism, non-renal (HCC) 07/04/2017  . Psoriasis 07/06/2017  . CFS (chronic fatigue syndrome) 07/06/2017  . Easy bruising 07/26/2017  . Myofascial pain 08/19/2017  . Itching 08/19/2017  . Yeast infection 08/19/2017  . IBS (irritable bowel syndrome) 12/03/2017  . Recurrent falls 12/29/2017  . Weakness 02/11/2018  . 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 Urinary frequency 02/14/2018  . Chronic left shoulder pain 03/15/2018  . Chronic pain of left thumb 03/15/2018  . 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 Jerking 09/12/2018  . Episode of confusion 09/12/2018  . Right leg pain 11/14/2018  . Elevated serum creatinine 11/14/2018  . Dry mouth 11/14/2018  . Bilateral leg weakness 01/23/2019  . Skin infection 01/23/2019  . Chronic nonintractable headache 02/07/2019  . 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  . Intertrigo 08/11/2019  . Melasma 08/11/2019  . Pneumonia of left upper lobe due to Chlamydia species 08/11/2019  . OAB (overactive bladder) 08/11/2019   Resolved Ambulatory Problems  Diagnosis Date Noted  . Hyperparathyroidism, primary (HCC) 09/18/2016  . Rash and nonspecific skin eruption 10/18/2016  . Acute bronchitis 02/07/2018   Past Medical History:  Diagnosis Date  .  History of pulmonary embolism 03/27/2016  . Microscopic colitis   . Migraine   . Morbid obesity (HCC) 05/27/2016   Reviewed med, allergy, problem list.    Observations/Objective: Pale appearance Barking cough on video today No labored breathing.   .. Today's Vitals   10/18/19 1133  Weight: (!) 365 lb (165.6 kg)  Height: 5\' 4"  (1.626 m)   Body mass index is 62.65 kg/m.    Assessment and Plan: Marland KitchenArnella was seen today for cough.  Diagnoses and all orders for this visit:  Acute bronchitis, unspecified organism -     chlorpheniramine-HYDROcodone (TUSSIONEX) 10-8 MG/5ML SUER; Take 5 mLs by mouth every 12 (twelve) hours as needed. -     levofloxacin (LEVAQUIN) 500 MG tablet; Take 1 tablet (500 mg total) by mouth daily. -     predniSONE (DELTASONE) 20 MG tablet; Take 3 tablet for 3 days, take 2 tablets for 3 days, take 1 tablet for 3 days, take 1/2 tablet for 4 days.  Morbidly obese (HCC)  Cough -     chlorpheniramine-HYDROcodone (TUSSIONEX) 10-8 MG/5ML SUER; Take 5 mLs by mouth every 12 (twelve) hours as needed. -     levofloxacin (LEVAQUIN) 500 MG tablet; Take 1 tablet (500 mg total) by mouth daily. -     predniSONE (DELTASONE) 20 MG tablet; Take 3 tablet for 3 days, take 2 tablets for 3 days, take 1 tablet for 3 days, take 1/2 tablet for 4 days.  Persistent cough -     levofloxacin (LEVAQUIN) 500 MG tablet; Take 1 tablet (500 mg total) by mouth daily. -     predniSONE (DELTASONE) 20 MG tablet; Take 3 tablet for 3 days, take 2 tablets for 3 days, take 1 tablet for 3 days, take 1/2 tablet for 4 days.  History of pulmonary embolus (PE)  History of pneumonia -     levofloxacin (LEVAQUIN) 500 MG tablet; Take 1 tablet (500 mg total) by mouth daily.   Pt is almost outside quarantine window and has been tested multiple times for covid. She has also had vaccine. Continue to quarantine. Suggest to get pulse ox to keep better tabs on oxygen saturation.  Start prednisone, levaquin,  albuterol and cough syrup due to recurrent bacteria infections. Rest and hydrate. Zinc and vitamin C encouraged. Follow up if no improvement or worsening of symptoms.   Consider coming in for spirometry when back to baseline to consider preventative inhalers and to rule out COPD/asthma.    Follow Up Instructions:    I discussed the assessment and treatment plan with the patient. The patient was provided an opportunity to ask questions and all were answered. The patient agreed with the plan and demonstrated an understanding of the instructions.   The patient was advised to call back or seek an in-person evaluation if the symptoms worsen or if the condition fails to improve as anticipated.  I provided 10 minutes of non-face-to-face time during this encounter.   Belenda Cruise, PA-C

## 2019-10-18 NOTE — Progress Notes (Signed)
Symptoms started Wednesday of last week  100.3 fever, ran one for 5 days Last two days no fever Cough worse (not productive) Dizzy Nauseated  Has taken OTC cough meds, didn't help Tylenol - helped with fever Has not taken mucinex

## 2019-10-21 ENCOUNTER — Encounter: Payer: Self-pay | Admitting: Physician Assistant

## 2019-10-24 NOTE — Telephone Encounter (Signed)
Denial received. Mychart message sent to patient.

## 2019-10-31 ENCOUNTER — Other Ambulatory Visit: Payer: Self-pay | Admitting: Physician Assistant

## 2019-10-31 DIAGNOSIS — R21 Rash and other nonspecific skin eruption: Secondary | ICD-10-CM

## 2019-11-01 ENCOUNTER — Ambulatory Visit: Payer: BLUE CROSS/BLUE SHIELD

## 2019-11-01 DIAGNOSIS — R102 Pelvic and perineal pain: Secondary | ICD-10-CM

## 2019-11-01 NOTE — Progress Notes
Patient: Vanessa Terry  DOB: Jun 21, 1970  MRN: [id number]  Date: 11/01/2019       Chief Complaint:   Chief Complaint   Patient presents with   ??? Abdominal Pain     Per pt lower abdominal pain x one month per pt hx of ovarian cyst.       Subjective   Pt is a 49 y.o. female that is here for above.     H/O fibroids and ovarian cyst removed in past.  Pt had it done in 2011. Pt states one month ago became aware of the right lower side of the abdomen with discomfort.  No vomiting  No loss of appetite.  No UTI symptoms at all.  No diarrhea.  No increased vaginal discharge.  Pt feels it is normal.   Pt is not preventing pregnancy.  Pt feels like her ovarian cysts are returning.       LMP  10/23/2019        Meds and allergies were reviewed today.     No additional contributory past medical, surgical, family or social history except as documented in care connect.   ROS negative except as documented above     Past Medical History:   Diagnosis Date   ??? AK (actinic keratosis)    ??? Anemia    ??? Anxiety    ??? Coccyx pain    ??? Constipation    ??? Depression    ??? IBS (irritable bowel syndrome)    ??? Incontinence in female    ??? Tinnitus of left ear    ??? Vertigo      Patient Active Problem List   Diagnosis   ??? Tinnitus of left ear   ??? Vertigo   ??? Coccyx pain   ??? Toe pain, left   ??? History of in vitro fertilization   ??? Abscess   ??? Hyperlipidemia, unspecified   ??? Mood changes   ??? Family history of colorectal cancer   ??? Family history of colon cancer requiring screening colonoscopy   ??? Depression   ??? Incontinence in female   ??? Closed displaced segmental fracture of shaft of right humerus       Meds:  Outpatient Medications Prior to Visit   Medication Sig   ??? PARoxetine 10 mg tablet TAKE 1 TABLET BY MOUTH EVERY DAY.   ??? acetaminophen 325 mg tablet Take 1-2 tablets by mouth every 4 hours as needed for pain. (Patient not taking: Reported on 07/18/2019.)   ??? ferrous gluconate 324 mg tablet One tablet BID with 6 to 8 ounces of [place] juice.  Take with a meal.. (Patient not taking: Reported on 07/18/2019.)   ??? prochlorperazine 10 mg tablet Take 1 tablet (10 mg total) by mouth every six (6) hours as needed for Nausea or Vomiting. (Patient not taking: Reported on 06/20/2019.)     No facility-administered medications prior to visit.        Allergies   Allergen Reactions   ??? Amoxicillin      Headache   ??? Sulfamethoxazole-Trimethoprim      Eye swelling            Objective   Review of Systems   ROS Normal unless checked below:    Check if Abnormal  Note Positive Findings    []  Constit      []  Eyes      []  ENT      []  Thyroid     []  Resp      []   Imm/All      []  CV      []  GI      []  GU      []  Endo      []  MS      []  Skin      []  Heme/Lymph      []  Neuro      []  Psych     [x]  Other       Vitals: BP 112/76  ~ Pulse 51  ~ Temp 36 ???C (96.8 ???F) (Tympanic)  ~ Resp 14  ~ Ht 5' 5'' (1.651 m)  ~ Wt 154 lb 6.4 oz (70 kg)  ~ LMP 10/23/2019  ~ SpO2 100%  ~ BMI 25.69 kg/m???     Gen: NAD    Lungs: CTA B/L No R/R/W  Heart: S1S2 RRR, no murmurs  Abdomen: soft, NTND, no masses, BS present, No R/R/G  Declines Pelvic Exam  Ext: no edema, no lesions. Moves all extremities equally.   Neuro: Alert and oriented.         PH-9 TOTAL SCORE [place] 12/04/2014 04/22/2018 11/15/2018 11/01/2019   Total Score 12 4 3  0   Some encounter information is confidential and restricted. Go to Review Flowsheets activity to see all data.       Labs/Studies:  Lab Results   Component Value Date    NA 136 08/18/2019    K 4.0 08/18/2019    CL 102 08/18/2019    CO2 22 08/18/2019    BUN 10 08/18/2019    CALCIUM 9.3 08/18/2019    ALKPHOS 63 08/18/2019    AST 29 08/18/2019    ALT 22 08/18/2019    BILITOT 0.3 08/18/2019    GLUCOSE 83 08/18/2019     Lab Results   Component Value Date    WBC 6.67 08/18/2019    HGB 13.4 08/18/2019    HCT 40.8 08/18/2019    PLT 299 08/18/2019    RBC 4.28 08/18/2019    MCV 95.3 08/18/2019       Immunization History   Administered Date(s) Administered   ??? COVID-19, mRNA, LNP-S, PF, 30 mcg/0.3 mL Dose (Pfizer-BioNTech) 10/12/2019       Assessment /Plan     Diagnoses and all orders for this visit:    Pelvic pain  -     US pelvis complete transabd non-vascular; Future  -     US pelvis transvaginal non-ob; Future  -     POCT urine pregnancy; Future      UPT negative.   Pt will do [place] of pelvis.        Brandy Hale, DO         The above plan of care, diagnosis, orders, and follow-up were discussed with the patient.  Questions related to this recommended plan of care were answered.

## 2019-11-02 ENCOUNTER — Other Ambulatory Visit: Payer: Self-pay | Admitting: Physician Assistant

## 2019-11-02 DIAGNOSIS — N3281 Overactive bladder: Secondary | ICD-10-CM

## 2019-11-02 NOTE — Telephone Encounter (Signed)
Last written 06/06/2019 #30 with 1 refill

## 2019-11-03 ENCOUNTER — Encounter: Payer: Self-pay | Admitting: Physician Assistant

## 2019-11-03 DIAGNOSIS — G9332 Myalgic encephalomyelitis/chronic fatigue syndrome: Secondary | ICD-10-CM

## 2019-11-03 DIAGNOSIS — R5382 Chronic fatigue, unspecified: Secondary | ICD-10-CM

## 2019-11-03 MED ORDER — AMPHETAMINE-DEXTROAMPHET ER 30 MG PO CP24: 30.0000 mg | ORAL_CAPSULE | ORAL | 0 refills | Status: DC

## 2019-11-03 NOTE — Addendum Note (Signed)
Addended by: Jomarie Longs on: 11/03/2019 12:59 PM   Modules accepted: Orders

## 2019-11-03 NOTE — Telephone Encounter (Signed)
Please schedule for 2 month follow up ADHD, thanks!

## 2019-11-03 NOTE — Telephone Encounter (Signed)
Left voicemail for patient to call us back.

## 2019-11-03 NOTE — Telephone Encounter (Signed)
Osbaldo Mark I sent to [facility] the adderall first but she needed at [facility]. I sent to [facility] but can we call and cancel at [facility]?

## 2019-11-06 ENCOUNTER — Inpatient Hospital Stay: Payer: PRIVATE HEALTH INSURANCE

## 2019-11-06 DIAGNOSIS — R102 Pelvic and perineal pain: Secondary | ICD-10-CM

## 2019-11-07 ENCOUNTER — Telehealth: Payer: BLUE CROSS/BLUE SHIELD

## 2019-11-07 ENCOUNTER — Other Ambulatory Visit: Payer: Self-pay | Admitting: Physician Assistant

## 2019-11-07 DIAGNOSIS — K580 Irritable bowel syndrome with diarrhea: Secondary | ICD-10-CM

## 2019-11-07 NOTE — Telephone Encounter
Patient called to get her results for her stat US. Patient said that she would atleast like to have her results released today. Please Advise.

## 2019-11-08 ENCOUNTER — Ambulatory Visit: Payer: Commercial Managed Care - PPO

## 2019-11-11 ENCOUNTER — Other Ambulatory Visit: Payer: Self-pay | Admitting: Physician Assistant

## 2019-11-11 ENCOUNTER — Encounter: Payer: Self-pay | Admitting: Physician Assistant

## 2019-11-11 DIAGNOSIS — R053 Chronic cough: Secondary | ICD-10-CM

## 2019-11-15 MED ORDER — PROMETHAZINE-DM 6.25-15 MG/5ML PO SYRP: 5.0000 mL | ORAL_SOLUTION | Freq: Four times a day (QID) | ORAL | 0 refills | Status: DC | PRN

## 2019-11-15 NOTE — Addendum Note (Signed)
Addended by: Jomarie Longs on: 11/15/2019 03:57 PM   Modules accepted: Orders

## 2019-11-21 ENCOUNTER — Ambulatory Visit: Payer: Commercial Managed Care - PPO

## 2019-11-21 ENCOUNTER — Ambulatory Visit: Payer: BLUE CROSS/BLUE SHIELD

## 2019-11-21 DIAGNOSIS — R102 Pelvic and perineal pain: Secondary | ICD-10-CM

## 2019-11-24 ENCOUNTER — Other Ambulatory Visit: Payer: Self-pay | Admitting: Physician Assistant

## 2019-11-24 DIAGNOSIS — J209 Acute bronchitis, unspecified: Secondary | ICD-10-CM

## 2019-11-24 DIAGNOSIS — Z8701 Personal history of pneumonia (recurrent): Secondary | ICD-10-CM

## 2019-11-24 DIAGNOSIS — F339 Major depressive disorder, recurrent, unspecified: Secondary | ICD-10-CM

## 2019-11-24 DIAGNOSIS — R058 Other specified cough: Secondary | ICD-10-CM

## 2019-11-24 DIAGNOSIS — R519 Headache, unspecified: Secondary | ICD-10-CM

## 2019-11-24 DIAGNOSIS — G8929 Other chronic pain: Secondary | ICD-10-CM

## 2019-11-24 DIAGNOSIS — R053 Chronic cough: Secondary | ICD-10-CM

## 2019-11-24 DIAGNOSIS — R059 Cough, unspecified: Secondary | ICD-10-CM

## 2019-11-27 ENCOUNTER — Ambulatory Visit: Payer: Commercial Managed Care - PPO

## 2019-11-27 ENCOUNTER — Ambulatory Visit: Payer: BLUE CROSS/BLUE SHIELD

## 2019-11-27 ENCOUNTER — Telehealth: Payer: BLUE CROSS/BLUE SHIELD

## 2019-11-27 DIAGNOSIS — E7800 Pure hypercholesterolemia, unspecified: Secondary | ICD-10-CM

## 2019-11-27 NOTE — Telephone Encounter
Orders Request    What is being requested? (Tests, Labs, Imaging, etc.): cardiac labs    Reason for the request: patient has a follow up coming up 10/26    Where does the patient want to be seen?  [facility]     If outside [place], what is the fax number to the facility?      Has the patient seen their doctor for this matter? yes    Last office visit: 11/25/18    Patient or caller was offered an appointment but declined.    Patient or caller has been notified of the 24-48 hour turnaround time.

## 2019-11-28 NOTE — Telephone Encounter
Reply by: Gaetano Net. Waneta Fitting  HI. Blood work ordered. Thank you

## 2019-11-29 ENCOUNTER — Encounter: Payer: Self-pay | Admitting: Physician Assistant

## 2019-12-01 ENCOUNTER — Other Ambulatory Visit: Payer: Self-pay | Admitting: Physician Assistant

## 2019-12-01 DIAGNOSIS — G894 Chronic pain syndrome: Secondary | ICD-10-CM

## 2019-12-01 DIAGNOSIS — M7918 Myalgia, other site: Secondary | ICD-10-CM

## 2019-12-02 ENCOUNTER — Other Ambulatory Visit: Payer: Self-pay | Admitting: Physician Assistant

## 2019-12-02 DIAGNOSIS — F5101 Primary insomnia: Secondary | ICD-10-CM

## 2019-12-04 NOTE — Telephone Encounter (Signed)
Last written 07/31/2019 #30 no refills Last seen 10/18/2019

## 2019-12-08 ENCOUNTER — Other Ambulatory Visit: Payer: Self-pay | Admitting: Physician Assistant

## 2019-12-08 NOTE — Telephone Encounter (Signed)
Written 11/02/2019 #30 no refills Last appt 10/18/2019

## 2019-12-11 NOTE — Telephone Encounter (Signed)
Reminder for patient. This is controlled substance and should not be taking daily for headaches. Last refill was one month ago. Need to discuss headaches with appt.

## 2019-12-12 NOTE — Telephone Encounter (Signed)
Mychart message sent to patient.

## 2019-12-14 ENCOUNTER — Ambulatory Visit: Payer: BLUE CROSS/BLUE SHIELD

## 2019-12-19 ENCOUNTER — Ambulatory Visit: Payer: BLUE CROSS/BLUE SHIELD

## 2019-12-20 ENCOUNTER — Other Ambulatory Visit: Payer: Self-pay | Admitting: Physician Assistant

## 2019-12-20 DIAGNOSIS — R600 Localized edema: Secondary | ICD-10-CM

## 2019-12-27 ENCOUNTER — Ambulatory Visit: Payer: BLUE CROSS/BLUE SHIELD

## 2019-12-27 DIAGNOSIS — R590 Localized enlarged lymph nodes: Secondary | ICD-10-CM

## 2019-12-27 DIAGNOSIS — R0789 Other chest pain: Secondary | ICD-10-CM

## 2019-12-28 ENCOUNTER — Ambulatory Visit: Payer: BLUE CROSS/BLUE SHIELD

## 2019-12-28 DIAGNOSIS — R102 Pelvic and perineal pain: Secondary | ICD-10-CM

## 2019-12-28 DIAGNOSIS — R590 Localized enlarged lymph nodes: Secondary | ICD-10-CM

## 2019-12-28 DIAGNOSIS — E785 Hyperlipidemia, unspecified: Secondary | ICD-10-CM

## 2019-12-28 DIAGNOSIS — R0789 Other chest pain: Secondary | ICD-10-CM

## 2019-12-28 LAB — Basic Metabolic Panel
GFR ESTIMATE FOR AFRICAN AMERICAN: >89 mL/min/{1.73_m2} (ref 8–19)
GLUCOSE: 86 mg/dL (ref 65–99)

## 2019-12-28 LAB — Lipid Panel: CHOLESTEROL: 228 mg/dL (ref >50–<150)

## 2019-12-28 LAB — Differential Automated: MONOCYTE PERCENT, AUTO: 7.1 (ref 0.00–0.10)

## 2019-12-28 LAB — CBC: NEUTROPHILS ABS (PRELIM): 3.87 10*3/uL (ref 26.4–33.4)

## 2019-12-28 NOTE — Progress Notes
Name: Vanessa Terry  MRN: [id number]    Chief Complaint   Patient presents with   ??? [place]     right armpit        HPI:      Vanessa Terry is a 49 y.o. female who had ORIF of right humerus fracture in April. For past few weeks has noted a tenderness in the right axilla and now senses a swelling there.  She notes tenderness in the upper outer breast and chest wall as well.  Similar but less intense symptoms on her left side. LMP was two weeks ago.  Has tender breasts and has had MRI, all good.    Coming in the morning for fasting labs per cardiologist. Had thought to see here PCP while she was here but has been told that she will not be in office tomorrow so came in tonight. She is not fasting so we cannot do labs requested.    Medications that the patient states to be currently taking   Medication Sig   ??? PARoxetine 10 mg tablet TAKE 1 TABLET BY MOUTH EVERY DAY.     Allergies   Allergen Reactions   ??? Amoxicillin      Headache   ??? Sulfamethoxazole-Trimethoprim      Eye swelling     Patient Active Problem List   Diagnosis   ??? Tinnitus of left ear   ??? Vertigo   ??? Coccyx pain   ??? Toe pain, left   ??? History of in vitro fertilization   ??? Abscess   ??? Hyperlipidemia, unspecified   ??? Mood changes   ??? Family history of colorectal cancer   ??? Family history of colon cancer requiring screening colonoscopy   ??? Depression   ??? Incontinence in female   ??? Closed displaced segmental fracture of shaft of right humerus     Past Medical History:   Diagnosis Date   ??? AK (actinic keratosis)    ??? Anemia    ??? Anxiety    ??? Coccyx pain    ??? Constipation    ??? Depression    ??? IBS (irritable bowel syndrome)    ??? Incontinence in female    ??? Tinnitus of left ear    ??? Vertigo      Past Surgical History:   Procedure Laterality Date   ??? CESAREAN SECTION  2014   ??? KNEE SURGERY  1994    in [place], arthroscopic   ??? LAPAROSCOPY DIAGNOSTIC / BIOPSY / ASPIRATION / LYSIS  2011   ??? MYOMECTOMY  2012    [facility]   ??? OVARY SURGERY  2011   ??? [facility]OVAL OF R OVARIAN CYST  2011    [facility]    ??? right humerus fracture Right 2021     Social History     Socioeconomic History   ??? Marital status: Married     Spouse name: Not on file   ??? Number of children: 2   ??? Years of education: Not on file   ??? Highest education level: Not on file   Occupational History   ??? Occupation: Homemaker   ??? Occupation: Education officer, environmental   Social Needs   ??? Financial resource strain: Not on file   ??? Food insecurity     Worry: Not on file     Inability: Not on file   ??? Transportation needs     Medical: Not on file     Non-medical: Not on file   Tobacco Use   ???  Smoking status: Former Smoker   ??? Smokeless tobacco: Never Used   ??? Tobacco comment: quit 10 yrs ago, smoked socially x15 yrs   Substance and Sexual Activity   ??? Alcohol use: No     Alcohol/week: 0.0 oz   ??? Drug use: No   ??? Sexual activity: Yes     Partners: Male     Birth control/protection: None   Lifestyle   ??? Physical activity     Days per week: 4 days     Minutes per session: 30 min   ??? Stress: Rather much   Relationships   ??? Social Wellsite geologist on phone: Not on file     Gets together: Not on file     Attends religious service: Not on file     Active member of club or organization: Not on file     Attends meetings of clubs or organizations: Not on file     Relationship status: Not on file   Other Topics Concern   ??? Not on file   Social History Narrative    Home: Lives in [place] with husband and 2 kids (twins); originally from [place], then [place] x15 yrs moved here in 2010; recent changes: no changes other than potentially moving dad to come live with her (in [place]) as she is DPOA and managing his health care, going back and forth to [place] to are for dad;  living situation and social supports: good    Family and Relationships: sister lives in [place], one in [place], 2 brothers in [place], dad is in [place] as above. Husband and kids as above.     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  Yoga in the past.      Supplement: folic acid MVI  B12    Diet: Regular.       Family History   Problem Relation Age of Onset   ??? Aortic stenosis Mother         s/p AV valve replacement   ??? Atrial fibrillation Mother         deceased from cardiosynch procedure   ??? Breast cancer Mother 4   ??? Alcohol abuse Mother    ??? Arthritis Mother    ??? Depression Mother    ??? Hyperlipidemia Mother    ??? Hypertension Mother    ??? Stroke Mother    ??? Skin cancer Mother    ??? Other (myocardial infarction) Mother    ??? Aortic stenosis Cousin         s/p AV valve replacement   ??? Meniere's disease Other         niece   ??? Colon cancer Maternal Uncle 45   ??? Rectal cancer Sister 65   ??? Depression Sister    ??? Alcohol abuse Sister    ??? Arthritis Sister    ??? Atrial fibrillation Father    ??? Heart failure Father    ??? Melanoma Father    ??? Alcohol abuse Father    ??? Arthritis Father    ??? Diabetes type II Father    ??? Hyperlipidemia Father    ??? Hypertension Father    ??? Kidney disease Father    ??? Learning disabilities Father    ??? Stroke Father    ??? Other (myocardial infarction) Father    ???  Other (COVID infection cause of death.) Father    ??? Hypertension Brother    ??? Learning disabilities Brother    ??? Alcohol abuse Brother    ??? Arthritis Brother    ??? Arthritis Paternal Grandmother    ??? Alcohol abuse Paternal Grandfather    ??? Arthritis Paternal Grandfather    ??? Alcohol abuse Sister    ??? Arthritis Sister    ??? Diabetes type II Brother    ??? Arthritis Brother    ??? Other (congenital cataract) Daughter    ??? Club foot Niece    ??? Other (aortic valve replaced) Maternal Cousin      Immunization History   Administered Date(s) Administered   ??? COVID-19, mRNA, (Pfizer) 30 mcg/0.3 mL 10/12/2019, 11/02/2019       Review of Systems: A full 14-system review of systems was performed and negative except as noted per the HPI.         Objective:     Physical Examination:  BP 115/70  ~ Pulse 60 ~ Temp 37.1 ???C (98.8 ???F) (Tympanic)  ~ Wt 159 lb (72.1 kg)  ~ SpO2 100%  ~ BMI 26.46 kg/m???     General:   Alert,  in NAD      Assessment and Plan:     Diagnoses and all orders for this visit:    Tenderness of chest wall  -     XR chest pa+lat (2 views); Future  -     [place] axilla right; Future  -     CBC & Auto Differential; Future    Axillary adenopathy  -     Cancel: CBC & Auto Differential; Future  -     [place] axilla right; Future  -     CBC & Auto Differential; Future        There are no Patient Instructions on file for this visit.    The above diagnosis, orders, and follow-up were discussed with the patient. The patient had all questions answered satisfactorily and understands this recommended plan of care.    Huey Bienenstock, MD

## 2019-12-28 NOTE — Patient Instructions
Added CBC to tomorrow's lab  Will arrange ultrasound for you in next day or two

## 2019-12-29 LAB — hsCRP (Cardio CRP) and CV Risk: HSCRP (CARDIO CRP) AND CV RISK: 0.3 mg/L

## 2020-01-01 DIAGNOSIS — R102 Pelvic and perineal pain: Secondary | ICD-10-CM

## 2020-01-02 ENCOUNTER — Inpatient Hospital Stay: Payer: BLUE CROSS/BLUE SHIELD

## 2020-01-05 ENCOUNTER — Ambulatory Visit: Payer: BLUE CROSS/BLUE SHIELD

## 2020-01-05 DIAGNOSIS — R001 Bradycardia, unspecified: Secondary | ICD-10-CM

## 2020-01-05 DIAGNOSIS — E785 Hyperlipidemia, unspecified: Secondary | ICD-10-CM

## 2020-01-05 DIAGNOSIS — R0789 Other chest pain: Secondary | ICD-10-CM

## 2020-01-05 DIAGNOSIS — R0600 Dyspnea, unspecified: Secondary | ICD-10-CM

## 2020-01-05 DIAGNOSIS — Z8249 Family history of ischemic heart disease and other diseases of the circulatory system: Secondary | ICD-10-CM
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
## 2020-01-05 NOTE — Patient Instructions
Download LoseIt app to help with weight loss    No butter

## 2020-01-07 ENCOUNTER — Other Ambulatory Visit: Payer: Self-pay | Admitting: Family Medicine

## 2020-01-07 DIAGNOSIS — F5101 Primary insomnia: Secondary | ICD-10-CM

## 2020-01-09 ENCOUNTER — Telehealth: Payer: PRIVATE HEALTH INSURANCE

## 2020-01-09 ENCOUNTER — Ambulatory Visit: Payer: PRIVATE HEALTH INSURANCE

## 2020-01-09 ENCOUNTER — Ambulatory Visit: Payer: PRIVATE HEALTH INSURANCE | Admitting: Physician Assistant

## 2020-01-14 ENCOUNTER — Other Ambulatory Visit: Payer: Self-pay | Admitting: Physician Assistant

## 2020-01-16 ENCOUNTER — Encounter: Payer: Self-pay | Admitting: Physician Assistant

## 2020-01-21 ENCOUNTER — Other Ambulatory Visit: Payer: Self-pay | Admitting: Physician Assistant

## 2020-01-21 DIAGNOSIS — K2100 Gastro-esophageal reflux disease with esophagitis, without bleeding: Secondary | ICD-10-CM

## 2020-01-22 ENCOUNTER — Ambulatory Visit (INDEPENDENT_AMBULATORY_CARE_PROVIDER_SITE_OTHER): Payer: PRIVATE HEALTH INSURANCE | Admitting: Physician Assistant

## 2020-01-22 ENCOUNTER — Encounter: Payer: Self-pay | Admitting: Physician Assistant

## 2020-01-22 ENCOUNTER — Other Ambulatory Visit: Payer: Self-pay

## 2020-01-22 ENCOUNTER — Ambulatory Visit (INDEPENDENT_AMBULATORY_CARE_PROVIDER_SITE_OTHER): Payer: PRIVATE HEALTH INSURANCE

## 2020-01-22 DIAGNOSIS — Z114 Encounter for screening for human immunodeficiency virus [HIV]: Secondary | ICD-10-CM

## 2020-01-22 DIAGNOSIS — N3281 Overactive bladder: Secondary | ICD-10-CM

## 2020-01-22 DIAGNOSIS — G9332 Myalgic encephalomyelitis/chronic fatigue syndrome: Secondary | ICD-10-CM

## 2020-01-22 DIAGNOSIS — Z5320 Procedure and treatment not carried out because of patient's decision for unspecified reasons: Secondary | ICD-10-CM

## 2020-01-22 DIAGNOSIS — F329 Major depressive disorder, single episode, unspecified: Secondary | ICD-10-CM

## 2020-01-22 DIAGNOSIS — R5382 Chronic fatigue, unspecified: Secondary | ICD-10-CM

## 2020-01-22 DIAGNOSIS — R053 Chronic cough: Secondary | ICD-10-CM

## 2020-01-22 DIAGNOSIS — Z86711 Personal history of pulmonary embolism: Secondary | ICD-10-CM

## 2020-01-22 DIAGNOSIS — J209 Acute bronchitis, unspecified: Secondary | ICD-10-CM

## 2020-01-22 DIAGNOSIS — F5101 Primary insomnia: Secondary | ICD-10-CM

## 2020-01-22 DIAGNOSIS — Z1211 Encounter for screening for malignant neoplasm of colon: Secondary | ICD-10-CM

## 2020-01-22 DIAGNOSIS — M25552 Pain in left hip: Secondary | ICD-10-CM | POA: Diagnosis not present

## 2020-01-22 DIAGNOSIS — Z23 Encounter for immunization: Secondary | ICD-10-CM | POA: Diagnosis not present

## 2020-01-22 DIAGNOSIS — M79645 Pain in left finger(s): Secondary | ICD-10-CM

## 2020-01-22 DIAGNOSIS — N898 Other specified noninflammatory disorders of vagina: Secondary | ICD-10-CM

## 2020-01-22 DIAGNOSIS — Z124 Encounter for screening for malignant neoplasm of cervix: Secondary | ICD-10-CM

## 2020-01-22 DIAGNOSIS — Z1159 Encounter for screening for other viral diseases: Secondary | ICD-10-CM

## 2020-01-22 DIAGNOSIS — L4050 Arthropathic psoriasis, unspecified: Secondary | ICD-10-CM

## 2020-01-22 DIAGNOSIS — G43011 Migraine without aura, intractable, with status migrainosus: Secondary | ICD-10-CM

## 2020-01-22 DIAGNOSIS — M25572 Pain in left ankle and joints of left foot: Secondary | ICD-10-CM

## 2020-01-22 DIAGNOSIS — R059 Cough, unspecified: Secondary | ICD-10-CM

## 2020-01-22 DIAGNOSIS — G8929 Other chronic pain: Secondary | ICD-10-CM

## 2020-01-22 MED ORDER — AMPHETAMINE-DEXTROAMPHET ER 30 MG PO CP24: 30.0000 mg | ORAL_CAPSULE | ORAL | 0 refills | Status: DC

## 2020-01-22 MED ORDER — AIMOVIG 140 MG/ML ~~LOC~~ SOAJ: SUBCUTANEOUS | 5 refills | Status: DC

## 2020-01-22 MED ORDER — ZOLPIDEM TARTRATE 5 MG PO TABS: 5.0000 mg | ORAL_TABLET | Freq: Every day | ORAL | 5 refills | Status: DC

## 2020-01-22 MED ORDER — DICLOFENAC SODIUM 1 % EX GEL: CUTANEOUS | 3 refills | Status: DC

## 2020-01-22 MED ORDER — WEGOVY 0.25 MG/0.5ML ~~LOC~~ SOAJ: 0.2500 mg | SUBCUTANEOUS | 0 refills | Status: DC

## 2020-01-22 MED ORDER — FLUTICASONE PROPIONATE 50 MCG/ACT NA SUSP: NASAL | 3 refills | Status: DC

## 2020-01-22 MED ORDER — TERBINAFINE HCL 250 MG PO TABS: 250.0000 mg | ORAL_TABLET | Freq: Every day | ORAL | 0 refills | Status: DC

## 2020-01-22 MED ORDER — FAMOTIDINE 40 MG PO TABS: ORAL_TABLET | ORAL | 3 refills | Status: DC

## 2020-01-22 MED ORDER — HYDROCODONE-ACETAMINOPHEN 5-325 MG PO TABS: ORAL_TABLET | ORAL | 0 refills | Status: DC

## 2020-01-22 MED ORDER — OXYBUTYNIN CHLORIDE ER 10 MG PO TB24: ORAL_TABLET | ORAL | 1 refills | Status: DC

## 2020-01-22 MED ORDER — FLUCONAZOLE 150 MG PO TABS: 150.0000 mg | ORAL_TABLET | Freq: Once | ORAL | 0 refills | Status: AC

## 2020-01-22 MED ORDER — BUPROPION HCL ER (XL) 300 MG PO TB24: 300.0000 mg | ORAL_TABLET | Freq: Every day | ORAL | 1 refills | Status: DC

## 2020-01-22 MED ORDER — MONTELUKAST SODIUM 10 MG PO TABS: ORAL_TABLET | ORAL | 3 refills | Status: DC

## 2020-01-22 MED ORDER — LEVOCETIRIZINE DIHYDROCHLORIDE 5 MG PO TABS: ORAL_TABLET | ORAL | 3 refills | Status: DC

## 2020-01-22 MED ORDER — AMPHETAMINE-DEXTROAMPHET ER 10 MG PO CP24: 10.0000 mg | ORAL_CAPSULE | Freq: Every day | ORAL | 0 refills | Status: DC

## 2020-01-22 NOTE — Progress Notes (Signed)
Established Patient Office Visit  Subjective:  Patient ID: Sarah Mayer, female    DOB: 1970-03-04  Age: 49 y.o. MRN: [id number]  CC:  Chief Complaint  Patient presents with  . Follow-up    HPI Sarah Mayer presents for ADHD f/u with med refill as well as to discuss other medications. PMH significant for morbid obesity, migraines, OSA, Barrett's esophagitis, psoriatic arthritis, hypothyroidism, hyperparathyroidism, chronic pain syndrome.   Last seen in person in clinic on 08/07/2019 when she was recovering from pneumonia and pulmonary embolism. Chest x-ray was obtained to confirm pneumonia was cleared (normal result on 08/07/2019). Eliquis was started in hospital and re-ordered at this visit.   Pt has seen derm for her whole body rash that is suspected tinea. She was started on ketonconazole cream some patches have resolved but some have not. She was given an oral pill for tinea once before and wonders if she can do that again.   She reports some left ankle pain and popping on and off. No new injury. At times it will swell laterally. No pain with bearing weight or ROM today.   Intermittent left hip pain and popping. PT seems to help.   Needs referral for colonoscopy and GYN. Her last GYN would not due pap because she could not get up on the table.     Past Medical History:  Diagnosis Date  . Chronic pain syndrome 05/25/2016  . Gastroesophageal reflux disease 03/24/2016  . History of DVT (deep vein thrombosis) 03/27/2016  . History of pulmonary embolism 03/27/2016  . IBS (irritable bowel syndrome)   . Microscopic colitis   . Migraine   . Morbid obesity (HCC) 05/27/2016  . Psoriatic arthritis (HCC)     History reviewed. No pertinent family history.   Outpatient Medications Prior to Visit  Medication Sig Dispense Refill  . albuterol (VENTOLIN HFA) 108 (90 Base) MCG/ACT inhaler Inhale 2 puffs into the lungs every 6 (six) hours as needed for wheezing or shortness of breath. 18 g  1  . AMBULATORY NON FORMULARY MEDICATION Power wheelchair due to not being able to ambulate. 1 Device 0  . AMBULATORY NON FORMULARY MEDICATION Tens unit to use for muscle atrophy, weakness, pain, muscle spasms 1 Device 0  . AMBULATORY NON FORMULARY MEDICATION Pulse ox to monitor SOB and Cough. 1 Device 0  . amitriptyline (ELAVIL) 100 MG tablet TAKE 1 TABLET(100 MG) BY MOUTH AT BEDTIME 90 tablet 1  . augmented betamethasone dipropionate (DIPROLENE-AF) 0.05 % ointment     . bumetanide (BUMEX) 0.5 MG tablet TAKE 1 TABLET(0.5 MG) BY MOUTH TWICE DAILY 180 tablet 1  . butalbital-acetaminophen-caffeine (FIORICET WITH CODEINE) 50-325-40-30 MG capsule TAKE 1 CAPSULE BY MOUTH EVERY 4 HOURS AS NEEDED FOR HEADACHE. FOLLOW UP VISIT WITH PCP 30 capsule 0  . chlorpheniramine-HYDROcodone (TUSSIONEX) 10-8 MG/5ML SUER Take 5 mLs by mouth every 12 (twelve) hours as needed. 120 mL 0  . cholecalciferol (VITAMIN D3) 25 MCG (1000 UT) tablet Take 1,000 Units by mouth daily.    . clindamycin (CLEOCIN T) 1 % lotion APPLY TOPCIALLY TO BILATERAL LEGS TWICE DAILY 60 mL 1  . clobetasol (OLUX) 0.05 % topical foam Apply topically 2 (two) times daily. 80 g 5  . clobetasol cream (TEMOVATE) 0.05 % Apply 1 application topically 2 (two) times daily. 60 g 2  . cyclobenzaprine (FLEXERIL) 10 MG tablet TAKE 1 TABLET(10 MG) BY MOUTH THREE TIMES DAILY AS NEEDED FOR MUSCLE SPASMS 90 tablet 3  . dicyclomine (BENTYL) 10 MG capsule  TAKE 1 CAPSULE BY MOUTH THREE TIMES DAILY BEFORE MEALS 270 capsule 1  . ELIQUIS 5 MG TABS tablet Take 1 tablet (5 mg total) by mouth 2 (two) times daily. 60 tablet 5  . fluocinonide cream (LIDEX) 0.05 % Apply topically 2 (two) times daily. 60 g 2  . gabapentin (NEURONTIN) 600 MG tablet TAKE 1 TABLET BY MOUTH THREE TIMES DAILY 180 tablet 1  . golimumab (SIMPONI ARIA) 50 MG/4ML SOLN injection 2mg /kg at week 0, week 4, then every 8 weeks thereafter.    . hydrOXYzine (ATARAX/VISTARIL) 50 MG tablet TAKE 1 TABLET BY MOUTH  AT BEDTIME, MAY REPEAT ONCE AS NEEDED FOR ITCHING 30 tablet 3  . ketoconazole (NIZORAL) 2 % cream Apply 1 application topically 2 (two) times daily. To affected areas. 60 g 1  . MULTIPLE VITAMIN PO Take by mouth daily.    . mupirocin ointment (BACTROBAN) 2 % Apply to affected area BID for 7 days. 30 g 3  . nystatin (MYCOSTATIN) 100000 UNIT/ML suspension Take 5 mLs (500,000 Units total) by mouth 4 (four) times daily. Swish for 30 seconds and spit out. 180 mL 0  . nystatin cream (MYCOSTATIN) APPLY TOPICALLY TO THE AFFECTED AREA TWICE DAILY 60 g 0  . ondansetron (ZOFRAN-ODT) 8 MG disintegrating tablet DISSOLVE 1 TABLET(8 MG) ON THE TONGUE EVERY 8 HOURS AS NEEDED FOR NAUSEA 30 tablet 1  . promethazine-dextromethorphan (PROMETHAZINE-DM) 6.25-15 MG/5ML syrup Take 5 mLs by mouth 4 (four) times daily as needed for cough. 473 mL 0  . RABEprazole (ACIPHEX) 20 MG tablet TAKE 1 TABLET(20 MG) BY MOUTH TWICE DAILY 60 tablet 11  . Respiratory Therapy Supplies (FLUTTER) DEVI Please use 4-5 times daily 1 each 0  . sucralfate (CARAFATE) 1 g tablet TAKE 1 TABLET(1 GRAM) BY MOUTH FOUR TIMES DAILY 120 tablet 2  . TRINTELLIX 20 MG TABS tablet TAKE 1 TABLET(20 MG) BY MOUTH DAILY 90 tablet 3  . AIMOVIG 140 MG/ML SOAJ ADMINISTER 1 SYRINGE UNDER THE SKIN EVERY 30 DAYS 1 mL 2  . amphetamine-dextroamphetamine (ADDERALL XR) 30 MG 24 hr capsule Take 1 capsule (30 mg total) by mouth every morning. 30 capsule 0  . amphetamine-dextroamphetamine (ADDERALL XR) 30 MG 24 hr capsule Take 1 capsule (30 mg total) by mouth every morning. 30 capsule 0  . amphetamine-dextroamphetamine (ADDERALL XR) 30 MG 24 hr capsule Take 1 capsule (30 mg total) by mouth every morning. 30 capsule 0  . amphetamine-dextroamphetamine (ADDERALL XR) 30 MG 24 hr capsule Take 1 capsule (30 mg total) by mouth every morning. 30 capsule 0  . buPROPion (WELLBUTRIN XL) 300 MG 24 hr tablet Take 300 mg by mouth daily.    . diclofenac Sodium (VOLTAREN) 1 % GEL APPLY 4  GRAMS EXTERNALLY TO THE AFFECTED AREA FOUR TIMES DAILY AS NEEDED 100 g 2  . famotidine (PEPCID) 40 MG tablet TAKE 1 TABLET(40 MG) BY MOUTH TWICE DAILY 180 tablet 1  . fluticasone (FLONASE) 50 MCG/ACT nasal spray One spray in each nostril twice a day, use left hand for right nostril, and right hand for left nostril. 48 g 3  . HYDROcodone-acetaminophen (NORCO/VICODIN) 5-325 MG tablet Take 1-2 tablets as needed at bedtime for moderate to severe pain. 60 tablet 0  . levocetirizine (XYZAL) 5 MG tablet TAKE 1 TABLET(5 MG) BY MOUTH EVERY EVENING 90 tablet 1  . levofloxacin (LEVAQUIN) 500 MG tablet Take 1 tablet (500 mg total) by mouth daily. 7 tablet 0  . montelukast (SINGULAIR) 10 MG tablet TAKE 1 TABLET(10 MG)  BY MOUTH AT BEDTIME 90 tablet 1  . oxybutynin (DITROPAN-XL) 10 MG 24 hr tablet TAKE 1 TABLET(10 MG) BY MOUTH AT BEDTIME 30 tablet 2  . predniSONE (DELTASONE) 20 MG tablet Take 3 tablet for 3 days, take 2 tablets for 3 days, take 1 tablet for 3 days, take 1/2 tablet for 4 days. 20 tablet 0  . zolpidem (AMBIEN) 5 MG tablet TAKE 1 TABLET BY MOUTH EVERY NIGHT AT BEDTIME 30 tablet 0   No facility-administered medications prior to visit.    Allergies  Allergen Reactions  . Imitrex [Sumatriptan]     Dizziness/somulence/nausea  . Methotrexate Derivatives     Hair fall out  . Moxifloxacin Hcl In Nacl Rash  . Nsaids     IRRITATES "COLITIS"  . Topamax [Topiramate]     rash  . Remeron [Mirtazapine]     Hair fall out.   . Clarithromycin     Unable to remember  . Moxifloxacin Rash  . Penicillins Rash  . Sulfa Antibiotics Rash  . Sumatriptan Succinate Nausea And Vomiting    19 Jan 2011    ROS Review of Systems  All other systems reviewed and are negative.     Objective:    Physical Exam Vitals reviewed.  Constitutional:      Appearance: Normal appearance. She is obese.  Cardiovascular:     Rate and Rhythm: Normal rate.     Pulses: Normal pulses.  Pulmonary:     Effort: Pulmonary  effort is normal.     Breath sounds: Normal breath sounds.  Musculoskeletal:     Comments: Left lateral ankle some scant non pitting edema.  NROM of left ankle.  No warmth or tenderness or redness to palpation.   ROM of left hip difficult due to body habitus.   Skin:    Comments: Erythematous macular patches over bilateral legs some with central clearing. Scattered fine scales over some.   Neurological:     General: No focal deficit present.     Mental Status: She is alert and oriented to person, place, and time.  Psychiatric:        Mood and Affect: Mood normal.     BP 114/87   Pulse 80   Ht  (1.626 m)   SpO2 95%   BMI 62.65 kg/m  Wt Readings from Last 3 Encounters:  10/18/19 (!) 365 lb (165.6 kg)  09/12/19 (!) 364 lb (165.1 kg)  04/24/19 (!) 364 lb (165.1 kg)     Health Maintenance Due  Topic Date Due  . PAP SMEAR-Modifier  12/04/2019          Assessment & Plan:  Marland KitchenMarland KitchenPatt was seen today for follow-up.  Diagnoses and all orders for this visit:  Morbidly obese (HCC) -     Lipid Panel With LDL/HDL Ratio -     HgB A1c -     CBC -     COMPLETE METABOLIC PANEL WITH GFR -     Semaglutide-Weight Management (WEGOVY) 0.25 MG/0.5ML SOAJ; Inject 0.25 mg into the skin once a week.  CFS (chronic fatigue syndrome) -     amphetamine-dextroamphetamine (ADDERALL XR) 30 MG 24 hr capsule; Take 1 capsule (30 mg total) by mouth every morning. -     amphetamine-dextroamphetamine (ADDERALL XR) 30 MG 24 hr capsule; Take 1 capsule (30 mg total) by mouth every morning. -     amphetamine-dextroamphetamine (ADDERALL XR) 30 MG 24 hr capsule; Take 1 capsule (30 mg total) by mouth every morning. -  amphetamine-dextroamphetamine (ADDERALL XR) 30 MG 24 hr capsule; Take 1 capsule (30 mg total) by mouth every morning. -     amphetamine-dextroamphetamine (ADDERALL XR) 10 MG 24 hr capsule; Take 1 capsule (10 mg total) by mouth daily. -     DG Hip Unilat W OR W/O Pelvis 2-3 Views  Left  History of pulmonary embolus (PE) -     CBC  Chronic pain of left thumb -     HYDROcodone-acetaminophen (NORCO/VICODIN) 5-325 MG tablet; Take 1-2 tablets as needed at bedtime for moderate to severe pain. -     diclofenac Sodium (VOLTAREN) 1 % GEL; APPLY 4 GRAMS EXTERNALLY TO THE AFFECTED AREA FOUR TIMES DAILY AS NEEDED  Psoriatic arthritis (HCC) -     HYDROcodone-acetaminophen (NORCO/VICODIN) 5-325 MG tablet; Take 1-2 tablets as needed at bedtime for moderate to severe pain.  Flu vaccine need -     Flu Vaccine QUAD 36+ mos IM  Primary insomnia -     zolpidem (AMBIEN) 5 MG tablet; Take 1 tablet (5 mg total) by mouth at bedtime.  OAB (overactive bladder) -     oxybutynin (DITROPAN-XL) 10 MG 24 hr tablet; TAKE 1 TABLET(10 MG) BY MOUTH AT BEDTIME  Cough -     montelukast (SINGULAIR) 10 MG tablet; TAKE 1 TABLET(10 MG) BY MOUTH AT BEDTIME  Acute bronchitis, unspecified organism -     levocetirizine (XYZAL) 5 MG tablet; TAKE 1 TABLET(5 MG) BY MOUTH EVERY EVENING -     fluticasone (FLONASE) 50 MCG/ACT nasal spray; One spray in each nostril twice a day, use left hand for right nostril, and right hand for left nostril.  Cough, persistent -     levocetirizine (XYZAL) 5 MG tablet; TAKE 1 TABLET(5 MG) BY MOUTH EVERY EVENING -     famotidine (PEPCID) 40 MG tablet; TAKE 1 TABLET(40 MG) BY MOUTH TWICE DAILY  Intractable migraine without aura and with status migrainosus -     Erenumab-aooe (AIMOVIG) 140 MG/ML SOAJ; ADMINISTER 1 SYRINGE UNDER THE SKIN EVERY 30 DAYS  Encounter for hepatitis C screening test for low risk patient -     Hepatitis C Antibody  HIV screening declined  Encounter for screening for HIV -     HIV antibody (with reflex)  Left hip pain -     DG Hip Unilat W OR W/O Pelvis 2-3 Views Left  Vaginal discharge -     fluconazole (DIFLUCAN) 150 MG tablet; Take 1 tablet (150 mg total) by mouth once for 1 dose. Repeat in 48 to 72 hours. -     Semaglutide-Weight  Management (WEGOVY) 0.25 MG/0.5ML SOAJ; Inject 0.25 mg into the skin once a week. -     WET PREP FOR TRICH, YEAST, CLUE  Chronic pain of left ankle -     diclofenac Sodium (VOLTAREN) 1 % GEL; APPLY 4 GRAMS EXTERNALLY TO THE AFFECTED AREA FOUR TIMES DAILY AS NEEDED  Colon cancer screening -     Ambulatory referral to Gastroenterology  Cervical cancer screening -     Ambulatory referral to Obstetrics / Gynecology  Major depressive disorder with current active episode, unspecified depression episode severity, unspecified whether recurrent -     buPROPion (WELLBUTRIN XL) 300 MG 24 hr tablet; Take 1 tablet (300 mg total) by mouth daily.  Other orders -     terbinafine (LAMISIL) 250 MG tablet; Take 1 tablet (250 mg total) by mouth daily.    Wet prep ordered to evaluate discharge. Diflucan given   Continue  to follow up with derm for rash. Added lamisil for 3 months  .   History of pulmonary embolus (PE) -     COMPLETE METABOLIC PANEL WITH GFR -  Continue eliquis 5mg  BID Morbid (severe) obesity due to excess calories (HCC)       -     Encouraged healthy, balanced diet as well as   increased physical activity, although this is limited   by pain and other conditions. Non weight bearing   activity like aquatic therapy may be beneficial to continue. Ok to continue PT. -started .  Discussed side effects and how to use. Follow up in 3 months. monthy check in to titrate up.  Acquired Hypoparathyroidism and Secondary Hyperparathyroidism -     Continue as instructed per endocrinology (last seen   01/08/2020) Psoriatic arthritis, psoriasis -     Continue as instructed per rheumatology (had   simponi infusion and was seen last on 11/29/2019) -  Continue Hydrocodone-acetaminophen 5-235 mg PRNC Chronic fatigue -     Refilled adderall XR 30mg  for 30 days -     Added 10mg  XR at patients request due to unrelenting fatigue.                      Follow-up: Patient is due for CPE.  Encouraged her to schedule to review health maintenance. F/U as needed for refills or new/worsening symptoms.   01/29/2020, PA-C   Spent 40 minutes discussing referrals, plan, medications and next step.    PA-C, have reviewed and agree with the above documentation in it's entirety.

## 2020-01-23 ENCOUNTER — Encounter: Payer: Self-pay | Admitting: Physician Assistant

## 2020-01-23 DIAGNOSIS — M5136 Other intervertebral disc degeneration, lumbar region: Secondary | ICD-10-CM | POA: Insufficient documentation

## 2020-01-23 DIAGNOSIS — M160 Bilateral primary osteoarthritis of hip: Secondary | ICD-10-CM | POA: Insufficient documentation

## 2020-01-23 LAB — HEMOGLOBIN A1C
Hgb A1c MFr Bld: 5.7 % of total Hgb — ABNORMAL HIGH (ref <5.7)
Mean Plasma Glucose: 117 (calc)
eAG (mmol/L): 6.5 (calc)

## 2020-01-23 LAB — CBC
HCT: 42.1 % (ref 35.0–45.0)
Hemoglobin: 14.3 g/dL (ref 11.7–15.5)
MCH: 28.5 pg (ref 27.0–33.0)
MCHC: 34 g/dL (ref 32.0–36.0)
MCV: 83.9 fL (ref 80.0–100.0)
MPV: 12.1 fL (ref 7.5–12.5)
Platelets: 300 10*3/uL (ref 140–400)
RBC: 5.02 10*6/uL (ref 3.80–5.10)
RDW: 15.9 % — ABNORMAL HIGH (ref 11.0–15.0)
WBC: 6.5 10*3/uL (ref 3.8–10.8)

## 2020-01-23 LAB — WET PREP FOR TRICH, YEAST, CLUE
MICRO NUMBER:: 11251848
Specimen Quality: ADEQUATE

## 2020-01-23 LAB — COMPLETE METABOLIC PANEL WITH GFR
AG Ratio: 1.8 (calc) (ref 1.0–2.5)
ALT: 46 U/L — ABNORMAL HIGH (ref 6–29)
AST: 19 U/L (ref 10–35)
Albumin: 4.4 g/dL (ref 3.6–5.1)
Alkaline phosphatase (APISO): 135 U/L — ABNORMAL HIGH (ref 31–125)
BUN: 12 mg/dL (ref 7–25)
CO2: 23 mmol/L (ref 20–32)
Calcium: 9.7 mg/dL (ref 8.6–10.2)
Chloride: 105 mmol/L (ref 98–110)
Creat: 0.77 mg/dL (ref 0.50–1.10)
GFR, Est African American: 106 mL/min/{1.73_m2} (ref >=60)
GFR, Est Non African American: 91 mL/min/{1.73_m2} (ref >=60)
Globulin: 2.5 g/dL (calc) (ref 1.9–3.7)
Glucose, Bld: 101 mg/dL — ABNORMAL HIGH (ref 65–99)
Potassium: 4.3 mmol/L (ref 3.5–5.3)
Sodium: 140 mmol/L (ref 135–146)
Total Bilirubin: 0.5 mg/dL (ref 0.2–1.2)
Total Protein: 6.9 g/dL (ref 6.1–8.1)

## 2020-01-23 LAB — HEPATITIS C ANTIBODY
Hepatitis C Ab: NONREACTIVE
SIGNAL TO CUT-OFF: 0.03 (ref <1.00)

## 2020-01-23 LAB — HIV ANTIBODY (ROUTINE TESTING W REFLEX): HIV 1&2 Ab, 4th Generation: NONREACTIVE

## 2020-01-23 NOTE — Progress Notes (Signed)
HIV negative

## 2020-01-23 NOTE — Progress Notes (Signed)
Rakel,   No trich/yeast of bacterial vaginosis seen in wet prep. Go ahead and treat for yeast. If not improving then could do a more specific vaginosis panel.   Stable A1C at 5.7 in pre-diabetes range.   Alk phos and one liver enzyme slightly elevated.   Some labs still pending.

## 2020-01-23 NOTE — Progress Notes (Signed)
No acute findings. Evidence of arthritis in both hips and lower spine.

## 2020-01-26 ENCOUNTER — Encounter: Payer: Self-pay | Admitting: Physician Assistant

## 2020-01-26 DIAGNOSIS — N898 Other specified noninflammatory disorders of vagina: Secondary | ICD-10-CM

## 2020-01-26 DIAGNOSIS — G9332 Myalgic encephalomyelitis/chronic fatigue syndrome: Secondary | ICD-10-CM

## 2020-01-26 DIAGNOSIS — R5382 Chronic fatigue, unspecified: Secondary | ICD-10-CM

## 2020-01-30 MED ORDER — WEGOVY 0.25 MG/0.5ML ~~LOC~~ SOAJ: 0.2500 mg | SUBCUTANEOUS | 0 refills | Status: DC

## 2020-01-30 MED ORDER — AMPHETAMINE-DEXTROAMPHET ER 30 MG PO CP24: 30.0000 mg | ORAL_CAPSULE | ORAL | 0 refills | Status: DC

## 2020-01-30 MED ORDER — BUTALBITAL-APAP-CAFF-COD 50-325-40-30 MG PO CAPS: 1.0000 | ORAL_CAPSULE | ORAL | 0 refills | Status: DC | PRN

## 2020-01-30 MED ORDER — AMPHETAMINE-DEXTROAMPHET ER 10 MG PO CP24: 10.0000 mg | ORAL_CAPSULE | Freq: Every day | ORAL | 0 refills | Status: DC

## 2020-01-30 NOTE — Telephone Encounter (Signed)
All sent

## 2020-01-31 ENCOUNTER — Encounter: Payer: Self-pay | Admitting: Physician Assistant

## 2020-02-06 ENCOUNTER — Ambulatory Visit: Payer: BLUE CROSS/BLUE SHIELD

## 2020-02-06 ENCOUNTER — Other Ambulatory Visit: Payer: Self-pay | Admitting: Neurology

## 2020-02-09 ENCOUNTER — Other Ambulatory Visit: Payer: Self-pay | Admitting: Physician Assistant

## 2020-02-11 ENCOUNTER — Other Ambulatory Visit: Payer: Self-pay | Admitting: Physician Assistant

## 2020-02-11 DIAGNOSIS — M7918 Myalgia, other site: Secondary | ICD-10-CM

## 2020-02-19 ENCOUNTER — Other Ambulatory Visit: Payer: Self-pay | Admitting: Physician Assistant

## 2020-02-19 DIAGNOSIS — J209 Acute bronchitis, unspecified: Secondary | ICD-10-CM

## 2020-02-19 DIAGNOSIS — R059 Cough, unspecified: Secondary | ICD-10-CM

## 2020-02-26 ENCOUNTER — Other Ambulatory Visit: Payer: Self-pay | Admitting: *Deleted

## 2020-02-26 DIAGNOSIS — J209 Acute bronchitis, unspecified: Secondary | ICD-10-CM

## 2020-02-26 DIAGNOSIS — R059 Cough, unspecified: Secondary | ICD-10-CM

## 2020-02-26 MED ORDER — ALBUTEROL SULFATE HFA 108 (90 BASE) MCG/ACT IN AERS: 2.0000 | INHALATION_SPRAY | Freq: Four times a day (QID) | RESPIRATORY_TRACT | 1 refills | Status: DC | PRN

## 2020-02-28 ENCOUNTER — Ambulatory Visit: Payer: PRIVATE HEALTH INSURANCE

## 2020-02-28 DIAGNOSIS — Z20822 Suspected COVID-19 virus infection: Secondary | ICD-10-CM

## 2020-02-29 NOTE — Progress Notes
Name: Vanessa Terry  MRN: [id number]    Chief Complaint   Patient presents with   ??? ILI/PUI        HPI:      Vanessa Terry is a 50 y.o. female with a COViD contact on 12/25 and onset sore throat on 12/30.  That is better but still with dry cough, runny nose and fatigue.  No meds.  Vaxxed, not die for booster, needs flu vaccines.    No outpatient medications have been marked as taking for the 02/28/20 encounter (Office Visit) with Huey Bienenstock., MD.     Allergies   Allergen Reactions   ??? Amoxicillin      Headache   ??? Sulfamethoxazole-Trimethoprim      Eye swelling     Patient Active Problem List   Diagnosis   ??? Tinnitus of left ear   ??? Vertigo   ??? Coccyx pain   ??? Toe pain, left   ??? History of in vitro fertilization   ??? Abscess   ??? Hyperlipidemia, unspecified   ??? Mood changes   ??? Family history of colorectal cancer   ??? Family history of colon cancer requiring screening colonoscopy   ??? Depression   ??? Incontinence in female   ??? Closed displaced segmental fracture of shaft of right humerus     Past Medical History:   Diagnosis Date   ??? AK (actinic keratosis)    ??? Anemia    ??? Anxiety    ??? Coccyx pain    ??? Constipation    ??? Depression    ??? IBS (irritable bowel syndrome)    ??? Incontinence in female    ??? Tinnitus of left ear    ??? Vertigo      Past Surgical History:   Procedure Laterality Date   ??? CESAREAN SECTION  2014   ??? KNEE SURGERY  1994    in [place], arthroscopic   ??? LAPAROSCOPY DIAGNOSTIC / BIOPSY / ASPIRATION / LYSIS  2011   ??? MYOMECTOMY  2012    [facility]   ??? OVARY SURGERY  2011   ??? REMOVAL OF R OVARIAN CYST  2011    [facility]    ??? right humerus fracture Right 2021     Social History     Socioeconomic History   ??? Marital status: Married     Spouse name: Not on file   ??? Number of children: 2   ??? Years of education: Not on file   ??? Highest education level: Not on file   Occupational History   ??? Occupation: Homemaker   ??? Occupation: Finance   Tobacco Use   ??? Smoking status: Former Smoker   ??? Smokeless tobacco: Never Used   ??? Tobacco comment: quit 10 yrs ago, smoked socially x15 yrs   Vaping Use   ??? Vaping Use: Never used   Substance and Sexual Activity   ??? Alcohol use: No     Alcohol/week: 0.0 oz   ??? Drug use: No   ??? Sexual activity: Yes     Partners: Male     Birth control/protection: None   Other Topics Concern   ??? Not on file   Social History Narrative    Home: Lives in [place] with husband and 2 kids (twins); originally from [place], then [place] x15 yrs moved here in 2010; recent changes: no changes other than potentially moving dad to come live with her (in [place]) as she is DPOA and managing his health care,  going back and forth to [place] to are for dad;  living situation and social supports: good    Family and Relationships: sister lives in [place], one in [place], 2 brothers in [place], dad is in [place] as above. Husband and kids as above.     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  Yoga in the past.      Supplement: folic acid MVI  B12    Diet: Regular.      Social Determinants of Health     Physical Activity: Unknown   ??? Days of Exercise per Week: 4 days   ??? Minutes of Exercise per Session: Not on file   Stress: Not on file   Financial Resource Strain: Not on file      Family History   Problem Relation Age of Onset   ??? Aortic stenosis Mother         s/p AV valve replacement   ??? Atrial fibrillation Mother         deceased from cardiosynch procedure   ??? Breast cancer Mother 32   ??? Alcohol abuse Mother    ??? Arthritis Mother    ??? Depression Mother    ??? Hyperlipidemia Mother    ??? Hypertension Mother    ??? Stroke Mother    ??? Skin cancer Mother    ??? Other (myocardial infarction) Mother    ??? Aortic stenosis Cousin         s/p AV valve replacement   ??? Meniere's disease Other         niece   ??? Colon cancer Maternal Uncle 45   ??? Rectal cancer Sister 80   ??? Depression Sister    ??? Alcohol abuse Sister    ??? Arthritis Sister    ??? Atrial fibrillation Father    ??? Heart failure Father    ??? Melanoma Father    ??? Alcohol abuse Father    ??? Arthritis Father    ??? Diabetes type II Father    ??? Hyperlipidemia Father    ??? Hypertension Father    ??? Kidney disease Father    ??? Learning disabilities Father    ??? Stroke Father    ??? Other (myocardial infarction) Father    ??? Other (COVID infection cause of death.) Father    ??? Hypertension Brother    ??? Learning disabilities Brother    ??? Alcohol abuse Brother    ??? Arthritis Brother    ??? Arthritis Paternal Grandmother    ??? Alcohol abuse Paternal Grandfather    ??? Arthritis Paternal Grandfather    ??? Alcohol abuse Sister    ??? Arthritis Sister    ??? Diabetes type II Brother    ??? Arthritis Brother    ??? Other (congenital cataract) Daughter    ??? Club foot Niece    ??? Other (aortic valve replaced) Maternal Cousin      Immunization History   Administered Date(s) Administered   ??? COVID-19, mRNA, (Pfizer) 30 mcg/0.3 mL 10/12/2019, 11/02/2019       Review of Systems: A full 14-system review of systems was performed and negative except as noted per the HPI.         Objective:     Physical Examination:  Pulse 67  ~ Temp 36.7 ???C (98.1 ???F)  ~ SpO2  99%     General:   Alert,  in NAD  No cough  No nasal congestion  No respiratory distress  No rashes      Assessment and Plan:     Diagnoses and all orders for this visit:    Suspected COVID-19 virus infection  -     COVID-19 and Influenza A/B PCR, Nasopharyngeal; Future; Expected date: 02/28/2020        There are no Patient Instructions on file for this visit.    The above diagnosis, orders, and follow-up were discussed with the patient. The patient had all questions answered satisfactorily and understands this recommended plan of care.    Huey Bienenstock, MD

## 2020-03-01 ENCOUNTER — Telehealth: Payer: BLUE CROSS/BLUE SHIELD

## 2020-03-01 ENCOUNTER — Ambulatory Visit: Payer: PRIVATE HEALTH INSURANCE

## 2020-03-01 ENCOUNTER — Encounter: Payer: Self-pay | Admitting: Physician Assistant

## 2020-03-01 LAB — COVID-19 and Influenza A/B PCR: INFLUENZA A PCR: NOT DETECTED

## 2020-03-08 ENCOUNTER — Other Ambulatory Visit: Payer: Self-pay | Admitting: Physician Assistant

## 2020-03-08 DIAGNOSIS — R21 Rash and other nonspecific skin eruption: Secondary | ICD-10-CM

## 2020-03-08 DIAGNOSIS — K2270 Barrett's esophagus without dysplasia: Secondary | ICD-10-CM

## 2020-03-08 DIAGNOSIS — Z86711 Personal history of pulmonary embolism: Secondary | ICD-10-CM

## 2020-03-18 ENCOUNTER — Encounter: Payer: Self-pay | Admitting: Physician Assistant

## 2020-03-19 MED ORDER — BUSPIRONE HCL 15 MG PO TABS: 15.0000 mg | ORAL_TABLET | Freq: Two times a day (BID) | ORAL | 1 refills | Status: DC

## 2020-03-20 DIAGNOSIS — L4050 Arthropathic psoriasis, unspecified: Secondary | ICD-10-CM | POA: Diagnosis not present

## 2020-03-24 ENCOUNTER — Other Ambulatory Visit: Payer: Self-pay | Admitting: Physician Assistant

## 2020-03-25 NOTE — Telephone Encounter (Signed)
Last written 01/30/2020 #30 no refills Last seen 01/22/2020

## 2020-03-28 ENCOUNTER — Telehealth: Payer: Self-pay

## 2020-03-28 NOTE — Telephone Encounter (Signed)
Received notification through covermymeds that a PA was needed for patient's Aimovig140mg /ml inj.  PA submitted through covermymeds.  Approval received. Left message for patient to notify her.  Approval letter faxed to pharmacy.

## 2020-04-16 ENCOUNTER — Other Ambulatory Visit: Payer: Self-pay | Admitting: Physician Assistant

## 2020-04-18 ENCOUNTER — Telehealth: Payer: Self-pay | Admitting: Neurology

## 2020-04-18 ENCOUNTER — Other Ambulatory Visit: Payer: Self-pay | Admitting: Neurology

## 2020-04-18 DIAGNOSIS — F339 Major depressive disorder, recurrent, unspecified: Secondary | ICD-10-CM

## 2020-04-18 DIAGNOSIS — Z86711 Personal history of pulmonary embolism: Secondary | ICD-10-CM

## 2020-04-18 MED ORDER — VORTIOXETINE HBR 20 MG PO TABS: ORAL_TABLET | ORAL | 1 refills | Status: DC

## 2020-04-18 MED ORDER — ELIQUIS 5 MG PO TABS: ORAL_TABLET | ORAL | 1 refills | Status: DC

## 2020-04-18 MED ORDER — MIRABEGRON ER 25 MG PO TB24: 25.0000 mg | ORAL_TABLET | Freq: Every day | ORAL | 1 refills | Status: DC

## 2020-04-18 NOTE — Telephone Encounter (Signed)
Prior Authorization for Rabeprazole submitted via covermymeds. Awaiting response. Your information has been sent to [facility].

## 2020-04-19 ENCOUNTER — Telehealth: Payer: Self-pay

## 2020-04-19 NOTE — Telephone Encounter (Signed)
These were printed yesterday, in box waiting on signature, will send when complete.

## 2020-04-19 NOTE — Telephone Encounter (Signed)
A rep from [facility] Pres[facility]ocacy Program called requesting hard copies for the following rxs:  Eliquis, Myrbetriq and Trintellix.  Per the Rep - pt is able to get these medications through their program at no cost. Requesting rxs to be faxed to [phone].

## 2020-04-19 NOTE — Telephone Encounter (Signed)
Faxed to number provided with confirmation received.

## 2020-04-22 ENCOUNTER — Encounter: Payer: Self-pay | Admitting: Physician Assistant

## 2020-04-22 ENCOUNTER — Other Ambulatory Visit: Payer: Self-pay

## 2020-04-22 ENCOUNTER — Ambulatory Visit (INDEPENDENT_AMBULATORY_CARE_PROVIDER_SITE_OTHER): Payer: BLUE CROSS/BLUE SHIELD

## 2020-04-22 ENCOUNTER — Ambulatory Visit (INDEPENDENT_AMBULATORY_CARE_PROVIDER_SITE_OTHER): Payer: BLUE CROSS/BLUE SHIELD | Admitting: Physician Assistant

## 2020-04-22 VITALS — BP 137/83 | HR 90 | Ht 64.0 in

## 2020-04-22 DIAGNOSIS — Z86718 Personal history of other venous thrombosis and embolism: Secondary | ICD-10-CM

## 2020-04-22 DIAGNOSIS — G43011 Migraine without aura, intractable, with status migrainosus: Secondary | ICD-10-CM

## 2020-04-22 DIAGNOSIS — R5382 Chronic fatigue, unspecified: Secondary | ICD-10-CM

## 2020-04-22 DIAGNOSIS — Z1211 Encounter for screening for malignant neoplasm of colon: Secondary | ICD-10-CM | POA: Diagnosis not present

## 2020-04-22 DIAGNOSIS — Z86711 Personal history of pulmonary embolism: Secondary | ICD-10-CM | POA: Diagnosis not present

## 2020-04-22 DIAGNOSIS — R413 Other amnesia: Secondary | ICD-10-CM

## 2020-04-22 DIAGNOSIS — G8929 Other chronic pain: Secondary | ICD-10-CM

## 2020-04-22 DIAGNOSIS — L811 Chloasma: Secondary | ICD-10-CM | POA: Diagnosis not present

## 2020-04-22 DIAGNOSIS — F329 Major depressive disorder, single episode, unspecified: Secondary | ICD-10-CM

## 2020-04-22 DIAGNOSIS — R059 Cough, unspecified: Secondary | ICD-10-CM

## 2020-04-22 DIAGNOSIS — M79605 Pain in left leg: Secondary | ICD-10-CM | POA: Diagnosis not present

## 2020-04-22 DIAGNOSIS — M79645 Pain in left finger(s): Secondary | ICD-10-CM | POA: Diagnosis not present

## 2020-04-22 DIAGNOSIS — G9332 Myalgic encephalomyelitis/chronic fatigue syndrome: Secondary | ICD-10-CM

## 2020-04-22 DIAGNOSIS — M79604 Pain in right leg: Secondary | ICD-10-CM | POA: Diagnosis not present

## 2020-04-22 DIAGNOSIS — L304 Erythema intertrigo: Secondary | ICD-10-CM | POA: Diagnosis not present

## 2020-04-22 DIAGNOSIS — M25572 Pain in left ankle and joints of left foot: Secondary | ICD-10-CM

## 2020-04-22 DIAGNOSIS — K2100 Gastro-esophageal reflux disease with esophagitis, without bleeding: Secondary | ICD-10-CM

## 2020-04-22 DIAGNOSIS — L4050 Arthropathic psoriasis, unspecified: Secondary | ICD-10-CM | POA: Diagnosis not present

## 2020-04-22 DIAGNOSIS — J160 Chlamydial pneumonia: Secondary | ICD-10-CM | POA: Diagnosis not present

## 2020-04-22 MED ORDER — BUPROPION HCL ER (XL) 300 MG PO TB24: 300.0000 mg | ORAL_TABLET | Freq: Every day | ORAL | 1 refills | Status: DC

## 2020-04-22 MED ORDER — HYDROCODONE-ACETAMINOPHEN 5-325 MG PO TABS: ORAL_TABLET | ORAL | 0 refills | Status: DC

## 2020-04-22 MED ORDER — AMPHETAMINE-DEXTROAMPHET ER 30 MG PO CP24
30.0000 mg | ORAL_CAPSULE | ORAL | 0 refills | Status: DC
Start: 2020-05-20 — End: 2020-07-03

## 2020-04-22 MED ORDER — AMPHETAMINE-DEXTROAMPHET ER 30 MG PO CP24: 30.0000 mg | ORAL_CAPSULE | ORAL | 0 refills | Status: DC

## 2020-04-22 MED ORDER — DEXLANSOPRAZOLE 60 MG PO CPDR: 60.0000 mg | DELAYED_RELEASE_CAPSULE | Freq: Every day | ORAL | 3 refills | Status: DC

## 2020-04-22 MED ORDER — AMPHETAMINE-DEXTROAMPHET ER 10 MG PO CP24: 10.0000 mg | ORAL_CAPSULE | Freq: Every day | ORAL | 0 refills | Status: DC

## 2020-04-22 NOTE — Progress Notes (Signed)
Subjective:    Patient ID: Sarah Mayer, female    DOB: 1970/04/29, 50 y.o.   MRN: [id number]  HPI  Pt is a 50 yo obese female with Migraines, OSA, GERD, hyperparathyroidism, CFS, Psoratic Arthritis, chronic pain, Osteoarthritis, MDD and anxiety who presents to the clinic with multiple concerns.   Migraines are not improved. She has had migraines at least 4 days a week even with aimovig, elavil, gabapentin, Distrubution the same to the left side of the face. Muscle relaxer at bedtime has not helped. Elavil has not helped headaches either. She does feel like Inderal has helped some. topamax no improvement and rash. Concerned about risk of ALS with botox.  CFS- adderall does help. She needs refills. Continues to struggle day with energy.   Pt continues to struggle with memory, brain zaps. Seen neurology and unclear reason why.   Her cough was better then worse after pneumonia. Seen pulmonology in fall and no reason for cough. They suggest GERD related. She is on aciphex. Sucking on candy does help.    She is having some ortho pain and wonders if psoratic or osteoarthritis based. Left foot hurts and pops. No injury. Bilateral hips, shoulders achy. She is on symponi from rheumatology. She is also concerned with bilateral leg redness and pain. She is on eliqus but very worried about a blood clot. Her legs feel "heavy".   She is on pain contract. Taking norco once no more than twice a day.   She continues to struggle with weight. She would like to try wegovy.   EEG from neurology.      Sarah Mayer DOB: 1970-04-24 (49 y.o.)  DATE OF TEST: 01/29/2020  Reason for EEG Request:  1. Altered level of consciousness   Classification: NORMAL (awake/sleep)   IMPRESSION: This is a normal awake and asleep ambulatory EEG for a patient of this age. There are no focal or diffuse abnormalities. No spikes, sharp waves or paroxysmal discharges suggestive for epileptiform activity were  noted. Clinical correlation is advised.   Neuro-pysch- no cognitive issues. Moderate depression and anxiety.   .. Active Ambulatory Problems    Diagnosis Date Noted  . Psoriatic arthritis (HCC) 03/24/2016  . Gastroesophageal reflux disease 03/24/2016  . GAD (generalized anxiety disorder) 03/24/2016  . Iron deficiency anemia 03/24/2016  . History of pulmonary embolus (PE) 03/27/2016  . History of DVT (deep vein thrombosis) 03/27/2016  . Chronic tension-type headache, not intractable 03/27/2016  . Primary insomnia 03/27/2016  . Bilateral lower extremity edema 03/27/2016  . Chronic pain syndrome 05/25/2016  . Morbidly obese (HCC) 05/27/2016  . Serum calcium elevated 08/30/2016  . Diarrhea 08/30/2016  . Collagenous colitis 10/18/2016  . Barrett's esophagus without dysplasia 10/18/2016  . Arthralgia 10/18/2016  . Memory changes 11/23/2016  . Major depressive disorder with current active episode 11/23/2016  . OSA (obstructive sleep apnea) 11/23/2016  . Twitching 12/22/2016  . Upper airway cough syndrome 12/22/2016  . Bilateral hip pain 12/22/2016  . Tripping over things 01/12/2017  . Left arm pain 01/12/2017  . Left elbow pain 01/12/2017  . Numbness and tingling in left arm 01/17/2017  . Word finding difficulty 03/15/2017  . Intractable migraine without aura and with status migrainosus 03/15/2017  . Edema of both feet 07/04/2017  . B12 deficiency 07/04/2017  . Acquired hypothyroidism 07/04/2017  . Vitamin D deficiency 07/04/2017  . Secondary hyperparathyroidism, non-renal (HCC) 07/04/2017  . Psoriasis 07/06/2017  . CFS (chronic fatigue syndrome) 07/06/2017  . Easy bruising 07/26/2017  .  Myofascial pain 08/19/2017  . Itching 08/19/2017  . Yeast infection 08/19/2017  . IBS (irritable bowel syndrome) 12/03/2017  . Recurrent falls 12/29/2017  . Weakness 02/11/2018  . 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 Urinary frequency 02/14/2018  . Chronic left shoulder pain 03/15/2018  .  Chronic pain of left thumb 03/15/2018  . 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 Jerking 09/12/2018  . Episode of confusion 09/12/2018  . Bilateral leg pain 11/14/2018  . Elevated serum creatinine 11/14/2018  . Dry mouth 11/14/2018  . Bilateral leg weakness 01/23/2019  . Skin infection 01/23/2019  . Chronic nonintractable headache 02/07/2019  . 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  . Intertrigo 08/11/2019  . Melasma 08/11/2019  . Pneumonia of left upper lobe due to Chlamydia species 08/11/2019  . OAB (overactive bladder) 08/11/2019  . DDD (degenerative disc disease), lumbar 01/23/2020  . Osteoarthritis, hip, bilateral 01/23/2020   Resolved Ambulatory Problems    Diagnosis Date Noted  . Hyperparathyroidism, primary (HCC) 09/18/2016  . Rash and nonspecific skin eruption 10/18/2016  . Acute bronchitis 02/07/2018   Past Medical History:  Diagnosis Date  . History of pulmonary embolism 03/27/2016  . Microscopic colitis   . Migraine   . Morbid obesity (HCC) 05/27/2016         Review of Systems See HPI.     Objective:   Physical Exam Vitals reviewed.  Constitutional:      Appearance: Normal appearance. She is obese.  Cardiovascular:     Rate and Rhythm: Normal rate and regular rhythm.     Pulses: Normal pulses.     Heart sounds: Normal heart sounds.  Pulmonary:     Effort: Pulmonary effort is normal.     Breath sounds: Normal breath sounds. No wheezing or rhonchi.  Musculoskeletal:     Comments: Bilateral medial leg redness and slightly warm and tender to touch.     Neurological:     General: No focal deficit present.     Mental Status: She is alert.     Comments: Hard to recall details, medications, memory.   Psychiatric:        Mood and Affect: Mood normal.       .. Depression screen [facility] 2/9 01/22/2020 08/07/2019 02/03/2019 11/04/2018 09/09/2018  Decreased Interest 1 1 1 1 1   Down, Depressed, Hopeless 2 3 2 2 1   PHQ - 2 Score 3 4 3 3 2   Altered sleeping 1 3 3 2 1    Tired, decreased energy 3 3 3 3 3   Change in appetite 1 2 1 1 1   Feeling bad or failure about yourself  1 0 1 0 0  Trouble concentrating 3 2 3 2 2   Moving slowly or fidgety/restless 1 1 0 1 0  Suicidal thoughts 0 0 0 0 0  PHQ-9 Score 13 15 14 12 9   Difficult doing work/chores Extremely dIfficult Extremely dIfficult Extremely dIfficult Not difficult at all Extremely dIfficult   . GAD 7 : Generalized Anxiety Score 01/22/2020 08/07/2019 02/03/2019 11/04/2018  Nervous, Anxious, on Edge 3 3 3 3   Control/stop worrying 3 3 1 1   Worry too much - different things 3 3 3 1   Trouble relaxing 3 3 3 3   Restless 1 2 1  0  Easily annoyed or irritable 2 3 2 1   Afraid - awful might happen 1 2 1 1   Total GAD 7 Score 16 19 14 10   Anxiety Difficulty Somewhat difficult Extremely difficult Somewhat difficult Not difficult at all  Assessment & Plan:  Marland KitchenMarland KitchenSyniah was seen today for follow-up.  Diagnoses and all orders for this visit:  Psoriatic arthritis (HCC) -     Discontinue: HYDROcodone-acetaminophen (NORCO/VICODIN) 5-325 MG tablet; Take 1-2 tablets as needed at bedtime for moderate to severe pain. -     Ambulatory referral to Orthopedic Surgery  Colon cancer screening -     Ambulatory referral to Gastroenterology  Chronic pain of left thumb -     Discontinue: HYDROcodone-acetaminophen (NORCO/VICODIN) 5-325 MG tablet; Take 1-2 tablets as needed at bedtime for moderate to severe pain. -     Ambulatory referral to Orthopedic Surgery  Major depressive disorder with current active episode, unspecified depression episode severity, unspecified whether recurrent -     buPROPion (WELLBUTRIN XL) 300 MG 24 hr tablet; Take 1 tablet (300 mg total) by mouth daily. -     Ambulatory referral to Psychiatry  History of DVT (deep vein thrombosis) -     DG Chest 2 View -     US Venous Img Lower Bilateral (DVT)  Bilateral leg pain -     DG Chest 2 View -     US Venous Img Lower Bilateral  (DVT)  Cough -     DG Chest 2 View  CFS (chronic fatigue syndrome) -     amphetamine-dextroamphetamine (ADDERALL XR) 10 MG 24 hr capsule; Take 1 capsule (10 mg total) by mouth daily. -     amphetamine-dextroamphetamine (ADDERALL XR) 30 MG 24 hr capsule; Take 1 capsule (30 mg total) by mouth every morning. -     amphetamine-dextroamphetamine (ADDERALL XR) 30 MG 24 hr capsule; Take 1 capsule (30 mg total) by mouth every morning. -     amphetamine-dextroamphetamine (ADDERALL XR) 30 MG 24 hr capsule; Take 1 capsule (30 mg total) by mouth every morning.  Gastroesophageal reflux disease with esophagitis, unspecified whether hemorrhage -     dexlansoprazole (DEXILANT) 60 MG capsule; Take 1 capsule (60 mg total) by mouth daily.  Chronic pain of left ankle -     Ambulatory referral to Orthopedic Surgery  Intractable migraine without aura and with status migrainosus -     AMB referral to headache clinic  Memory changes -     Ambulatory referral to Psychiatry   Sounds like more GERD symptoms and perhaps why cough is returning. Stop aciphex and start dexilant. Will get CXR.  Pt is on eliquis but very concerned about blood clot. Will get venous dopplers today.   Pt on aimovig. Will refer to headache clinic.   Pt on multiple mood medications and seen 3 neurologist. Will send to [facility] to consult for jerking.   Will refer to ortho. Increased gabapentin to TID.    Spent 40 minutes with patient reviewing chart, ordering labs and test, discussing plan, and providing counseling.

## 2020-04-22 NOTE — Patient Instructions (Addendum)
Stop aciphex. Start dexilant.  Will get venous dopplers of leg today.  Will get CXR.  Will make referral to ortho.  Increased gabapentin to three times a day.  Will refer to the headache clinic Will refer to behavioral health for jerking.

## 2020-04-22 NOTE — Progress Notes (Signed)
GREAT news. NO DVT.

## 2020-04-23 ENCOUNTER — Encounter: Payer: Self-pay | Admitting: Physician Assistant

## 2020-04-23 NOTE — Progress Notes (Signed)
Alk phos and liver enzymes improved and normalized since last check.

## 2020-04-23 NOTE — Progress Notes (Signed)
Lungs look good. No signs of pneumonia.

## 2020-04-24 ENCOUNTER — Encounter: Payer: Self-pay | Admitting: Physician Assistant

## 2020-04-27 ENCOUNTER — Encounter: Payer: Self-pay | Admitting: Physician Assistant

## 2020-04-27 DIAGNOSIS — L4050 Arthropathic psoriasis, unspecified: Secondary | ICD-10-CM

## 2020-04-27 DIAGNOSIS — G8929 Other chronic pain: Secondary | ICD-10-CM

## 2020-04-28 LAB — COMPLETE METABOLIC PANEL WITH GFR
AG Ratio: 1.7 (calc) (ref 1.0–2.5)
ALT: 23 U/L (ref 6–29)
AST: 24 U/L (ref 10–35)
Albumin: 4.4 g/dL (ref 3.6–5.1)
Alkaline phosphatase (APISO): 122 U/L (ref 31–125)
BUN: 9 mg/dL (ref 7–25)
CO2: 26 mmol/L (ref 20–32)
Calcium: 9.7 mg/dL (ref 8.6–10.2)
Chloride: 101 mmol/L (ref 98–110)
Creat: 1.06 mg/dL (ref 0.50–1.10)
GFR, Est African American: 72 mL/min/{1.73_m2} (ref >=60)
GFR, Est Non African American: 62 mL/min/{1.73_m2} (ref >=60)
Globulin: 2.6 g/dL (calc) (ref 1.9–3.7)
Glucose, Bld: 98 mg/dL (ref 65–99)
Potassium: 4.4 mmol/L (ref 3.5–5.3)
Sodium: 140 mmol/L (ref 135–146)
Total Bilirubin: 0.4 mg/dL (ref 0.2–1.2)
Total Protein: 7 g/dL (ref 6.1–8.1)

## 2020-04-28 LAB — CORTISOL, FREE: Cortisol Free, Ser: 0.32 ug/dL

## 2020-04-28 LAB — ACTH: C206 ACTH: 16 pg/mL (ref 6–50)

## 2020-04-29 MED ORDER — HYDROCODONE-ACETAMINOPHEN 5-325 MG PO TABS
ORAL_TABLET | ORAL | 0 refills | Status: DC
Start: 2020-04-29 — End: 2020-06-24

## 2020-04-29 NOTE — Progress Notes (Signed)
Halana,   Cortisol and ACTH look good.

## 2020-05-01 ENCOUNTER — Encounter: Payer: Self-pay | Admitting: Physician Assistant

## 2020-05-02 ENCOUNTER — Telehealth: Payer: Self-pay | Admitting: Neurology

## 2020-05-02 NOTE — Telephone Encounter (Signed)
Prior Authorization for [employer] submitted via covermymeds. Awaiting response.

## 2020-05-06 DIAGNOSIS — L409 Psoriasis, unspecified: Secondary | ICD-10-CM | POA: Diagnosis not present

## 2020-05-06 DIAGNOSIS — L439 Lichen planus, unspecified: Secondary | ICD-10-CM | POA: Diagnosis not present

## 2020-05-06 DIAGNOSIS — M461 Sacroiliitis, not elsewhere classified: Secondary | ICD-10-CM | POA: Diagnosis not present

## 2020-05-06 DIAGNOSIS — Z5181 Encounter for therapeutic drug level monitoring: Secondary | ICD-10-CM | POA: Diagnosis not present

## 2020-05-13 ENCOUNTER — Encounter: Payer: Self-pay | Admitting: Physician Assistant

## 2020-05-14 MED ORDER — SAXENDA 18 MG/3ML ~~LOC~~ SOPN: 3.0000 mg | PEN_INJECTOR | Freq: Every day | SUBCUTANEOUS | 1 refills | Status: DC

## 2020-05-14 NOTE — Telephone Encounter (Signed)
Pended Saxenda, you can print and I can sent to [facility].

## 2020-05-14 NOTE — Telephone Encounter (Signed)
RX faxed to [phone] with confirmation received.

## 2020-05-16 ENCOUNTER — Telehealth: Payer: Self-pay | Admitting: Neurology

## 2020-05-16 ENCOUNTER — Ambulatory Visit: Payer: PRIVATE HEALTH INSURANCE | Admitting: Orthopedic Surgery

## 2020-05-16 NOTE — Telephone Encounter (Signed)
[facility] called and states they have to fill 90 day RX for patients through their program. Okayed fill for 90 day supply of Saxenda #45 mL. Arlin Sass  - FYI.

## 2020-05-17 DIAGNOSIS — R410 Disorientation, unspecified: Secondary | ICD-10-CM | POA: Diagnosis not present

## 2020-05-17 DIAGNOSIS — R253 Fasciculation: Secondary | ICD-10-CM | POA: Diagnosis not present

## 2020-05-17 DIAGNOSIS — L4050 Arthropathic psoriasis, unspecified: Secondary | ICD-10-CM | POA: Diagnosis not present

## 2020-05-17 DIAGNOSIS — R413 Other amnesia: Secondary | ICD-10-CM | POA: Diagnosis not present

## 2020-05-17 DIAGNOSIS — Z5181 Encounter for therapeutic drug level monitoring: Secondary | ICD-10-CM | POA: Diagnosis not present

## 2020-05-17 DIAGNOSIS — Z79899 Other long term (current) drug therapy: Secondary | ICD-10-CM | POA: Diagnosis not present

## 2020-05-17 NOTE — Telephone Encounter (Signed)
Ok perfect

## 2020-05-20 ENCOUNTER — Telehealth: Payer: Self-pay | Admitting: Neurology

## 2020-05-20 DIAGNOSIS — G253 Myoclonus: Secondary | ICD-10-CM | POA: Diagnosis not present

## 2020-05-20 DIAGNOSIS — M545 Low back pain, unspecified: Secondary | ICD-10-CM | POA: Diagnosis not present

## 2020-05-20 NOTE — Telephone Encounter (Signed)
Refill denied for cough medication, no longer on med list. Patient should contact us first.

## 2020-05-23 DIAGNOSIS — Z09 Encounter for follow-up examination after completed treatment for conditions other than malignant neoplasm: Secondary | ICD-10-CM | POA: Diagnosis not present

## 2020-05-23 DIAGNOSIS — Z79899 Other long term (current) drug therapy: Secondary | ICD-10-CM | POA: Diagnosis not present

## 2020-05-23 DIAGNOSIS — D509 Iron deficiency anemia, unspecified: Secondary | ICD-10-CM | POA: Diagnosis not present

## 2020-05-23 DIAGNOSIS — R760 Raised antibody titer: Secondary | ICD-10-CM | POA: Diagnosis not present

## 2020-05-23 DIAGNOSIS — Z86711 Personal history of pulmonary embolism: Secondary | ICD-10-CM | POA: Diagnosis not present

## 2020-05-23 DIAGNOSIS — Z7902 Long term (current) use of antithrombotics/antiplatelets: Secondary | ICD-10-CM | POA: Diagnosis not present

## 2020-05-23 DIAGNOSIS — I2699 Other pulmonary embolism without acute cor pulmonale: Secondary | ICD-10-CM | POA: Diagnosis not present

## 2020-05-23 DIAGNOSIS — R945 Abnormal results of liver function studies: Secondary | ICD-10-CM | POA: Diagnosis not present

## 2020-05-26 ENCOUNTER — Ambulatory Visit: Payer: BLUE CROSS/BLUE SHIELD

## 2020-05-26 DIAGNOSIS — Z20822 Suspected COVID-19 virus infection: Secondary | ICD-10-CM

## 2020-05-26 DIAGNOSIS — R6889 Other general symptoms and signs: Secondary | ICD-10-CM

## 2020-05-27 ENCOUNTER — Other Ambulatory Visit: Payer: Self-pay | Admitting: Neurology

## 2020-05-27 DIAGNOSIS — H6123 Impacted cerumen, bilateral: Secondary | ICD-10-CM

## 2020-05-27 LAB — COVID-19 and Influenza A/B PCR: INFLUENZA A PCR: NOT DETECTED

## 2020-05-27 MED ORDER — BUTALBITAL-APAP-CAFF-COD 50-325-40-30 MG PO CAPS: ORAL_CAPSULE | ORAL | 0 refills | Status: DC

## 2020-05-27 NOTE — Progress Notes
PATIENT: Vanessa Terry  MRN: [id number]  DOB: 04-01-1970  DATE OF SERVICE: 05/26/2020    CHIEF COMPLAINT:   Chief Complaint   Patient presents with   ??? ILI/PUI       Subjective:      Ricardo Kayes is a 50 y.o. female.    Patient here for a curbside evaluation  Complains of waking on Wednesday with sore throat (that is better now)  Thursday, had bodyaches  Friday, states she  'crashed'  covid test at home on Saturday was negative    Current symptoms include nasal congestion, runny nose, sneezing, headache, myalgias, dry cough, chills and fatigue.  Has some pain and decrease hearing in L ear.  Feels loss of smell.      No sob/cp  No other complaint  Had covid in jan 2022  covid vaccinated    PCP: Brandy Hale., DO   PCP Telephone: [phone]    Past Medical History:   Diagnosis Date   ??? AK (actinic keratosis)    ??? Anemia    ??? Anxiety    ??? Coccyx pain    ??? Constipation    ??? Depression    ??? IBS (irritable bowel syndrome)    ??? Incontinence in female    ??? Tinnitus of left ear    ??? Vertigo        Current Outpatient Medications   Medication Sig   ??? PARoxetine 10 mg tablet TAKE 1 TABLET BY MOUTH EVERY DAY.     No current facility-administered medications for this visit.       Allergies   Allergen Reactions   ??? Amoxicillin      Headache   ??? Sulfamethoxazole-Trimethoprim      Eye swelling       Immunization History   Administered Date(s) Administered   ??? COVID-19, mRNA, (Pfizer - Purple Cap) 30 mcg/0.3 mL 10/12/2019, 11/02/2019       No LMP recorded.    Review of Systems   14 point ROS obtained and negative except for what is stated above.       Objective:      Physical Exam   Last Recorded Vital Signs:    05/26/20 1738   Pulse: 54   Temp: 36.8 ???C (98.2 ???F)   SpO2: 99%       Gen:    A&O x 3 WDWN NAD, afebrile      Non-toxic and well appearing                 No respiratory distress, nl pox  Skin:    Normal, no diaphoresis, no rashes.        Turgor normal  HEENT:  NCAT       EOMI, conj clear       Ear canals with cerumen impactions                 TMs not well seen d/t wax       Nasal congestion       Pharynx normal       Uvula midline  CV:     RRR, no murmurs, gallops, or rubs  Pulm:     CTA B, no wheezes, rhonchi, or rales       No labored breathing  Abd:        Soft, nontender  Neuro:     Non-focal exam      Orders:      covid swab pending  Tried to remove wax from canals with lighted cerumen spoon.  Caused some pain, wax hard--> advise use wax softening gtts and then then will have ears lavaged. She has ILI (diffential includes but is not limited to, viral uri, influenza, covid, allergies(doubt)).  Also c/o L ear pain and dec hearing (differential includes but is not limited to, d/t cerumen impaction, infection, referred pain).  Advise we soften wax, then lavage it, and re evaluate and better visualize the TM.    Aware covid swab is pending      Assessment:       ILI  R/o covid / influenza  L ear pain/dec hearing  Cerumen Impactions      Plan:           Rest  Fluids  Tylenol/ibuprofen   otc wax softening drops  Then lavage for better visibility of TM  Return precautions   ER precautions    Pt is clinically stable, follow up with PCP.  Urgent care or ER if condition worsens or new symptoms develop.  Patient understands and agrees with plan.  I discussed the above assessment and plan with the patient.  He had the opportunity to ask questions, and his questions and concerns were addressed.      Author:  Bard Herbert 05/26/2020 5:43 PM

## 2020-05-27 NOTE — Telephone Encounter (Signed)
Last written 03/25/2020 #30 no refills

## 2020-06-02 ENCOUNTER — Other Ambulatory Visit: Payer: Self-pay | Admitting: Physician Assistant

## 2020-06-02 DIAGNOSIS — G8929 Other chronic pain: Secondary | ICD-10-CM

## 2020-06-02 DIAGNOSIS — R058 Other specified cough: Secondary | ICD-10-CM

## 2020-06-02 DIAGNOSIS — R519 Headache, unspecified: Secondary | ICD-10-CM

## 2020-06-02 DIAGNOSIS — F339 Major depressive disorder, recurrent, unspecified: Secondary | ICD-10-CM

## 2020-06-04 ENCOUNTER — Other Ambulatory Visit: Payer: Self-pay | Admitting: Physician Assistant

## 2020-06-17 ENCOUNTER — Encounter: Payer: Self-pay | Admitting: Physician Assistant

## 2020-06-21 ENCOUNTER — Ambulatory Visit: Payer: BLUE CROSS/BLUE SHIELD | Admitting: Physician Assistant

## 2020-06-21 MED ORDER — NURTEC 75 MG PO TBDP: 1.0000 | ORAL_TABLET | ORAL | 1 refills | Status: DC

## 2020-06-21 NOTE — Telephone Encounter (Signed)
Have not seen anything on patient, ordered RX for Nurtec and faxed to [facility] at [phone] with confirmation received.

## 2020-06-24 ENCOUNTER — Encounter: Payer: Self-pay | Admitting: Physician Assistant

## 2020-06-24 ENCOUNTER — Ambulatory Visit (INDEPENDENT_AMBULATORY_CARE_PROVIDER_SITE_OTHER): Payer: BLUE CROSS/BLUE SHIELD | Admitting: Physician Assistant

## 2020-06-24 ENCOUNTER — Other Ambulatory Visit: Payer: Self-pay

## 2020-06-24 ENCOUNTER — Other Ambulatory Visit: Payer: Self-pay | Admitting: Physician Assistant

## 2020-06-24 VITALS — BP 135/78 | HR 85 | Temp 98.9°F | Resp 20 | Ht 64.0 in

## 2020-06-24 DIAGNOSIS — G894 Chronic pain syndrome: Secondary | ICD-10-CM

## 2020-06-24 DIAGNOSIS — L4050 Arthropathic psoriasis, unspecified: Secondary | ICD-10-CM | POA: Diagnosis not present

## 2020-06-24 DIAGNOSIS — R053 Chronic cough: Secondary | ICD-10-CM | POA: Diagnosis not present

## 2020-06-24 DIAGNOSIS — E66813 Obesity, class 3: Secondary | ICD-10-CM

## 2020-06-24 DIAGNOSIS — G8929 Other chronic pain: Secondary | ICD-10-CM

## 2020-06-24 DIAGNOSIS — R519 Headache, unspecified: Secondary | ICD-10-CM

## 2020-06-24 DIAGNOSIS — N3000 Acute cystitis without hematuria: Secondary | ICD-10-CM | POA: Diagnosis not present

## 2020-06-24 DIAGNOSIS — K2100 Gastro-esophageal reflux disease with esophagitis, without bleeding: Secondary | ICD-10-CM

## 2020-06-24 DIAGNOSIS — R350 Frequency of micturition: Secondary | ICD-10-CM

## 2020-06-24 DIAGNOSIS — Z6841 Body Mass Index (BMI) 40.0 and over, adult: Secondary | ICD-10-CM

## 2020-06-24 LAB — POCT URINALYSIS DIP (CLINITEK)
Bilirubin, UA: NEGATIVE
Blood, UA: NEGATIVE
Glucose, UA: NEGATIVE mg/dL
Ketones, POC UA: NEGATIVE mg/dL
Nitrite, UA: POSITIVE — AB
POC PROTEIN,UA: NEGATIVE
Spec Grav, UA: 1.02 (ref 1.010–1.025)
Urobilinogen, UA: 2 E.U./dL — AB
pH, UA: 6 (ref 5.0–8.0)

## 2020-06-24 MED ORDER — SAXENDA 18 MG/3ML ~~LOC~~ SOPN: 3.0000 mg | PEN_INJECTOR | Freq: Every day | SUBCUTANEOUS | 1 refills | Status: DC

## 2020-06-24 MED ORDER — NITROFURANTOIN MONOHYD MACRO 100 MG PO CAPS
100.0000 mg | ORAL_CAPSULE | Freq: Two times a day (BID) | ORAL | 0 refills | Status: DC
Start: 2020-06-24 — End: 2021-01-01

## 2020-06-24 MED ORDER — FLOVENT HFA 110 MCG/ACT IN AERO
2.0000 | INHALATION_SPRAY | Freq: Two times a day (BID) | RESPIRATORY_TRACT | 5 refills | Status: DC
Start: 2020-06-24 — End: 2020-07-11

## 2020-06-24 MED ORDER — QULIPTA 60 MG PO TABS: 60.0000 mg | ORAL_TABLET | Freq: Every day | ORAL | 0 refills | Status: DC

## 2020-06-24 NOTE — Progress Notes (Signed)
Subjective:    Patient ID: Sarah Mayer, female    DOB: [birth year], 50 y.o.   MRN: [id number]  HPI  Pt is a 50 yo obese female with Psoratic Arthritis, GERD, GAD, Chronic pain, MDD, Chronic cough, Chronic headaches, CFS who presents to the clinic for follow up.   She is using a new pharmacy that helps her pay for branded medication called [facility].   dexilant- eliquis aimovig mybetriq nurtec  trintellix   GERD not controlled on aciphex. Needs dexilant.   Chronic cough patient is on:   Elavil singulair xyzal flonase  She continues to cough and feel SOb and chest tightness.   She continues to have migraines and chronic headaches.  Chronic pain is fairly well controlled on norco. Pt is working with rheumatology on psoratic arthritis symptoms.   Pt is having some urinary frequency for the last week or so but has not been able to get in to the clinic. No fever, chills, nausea, flank pain, abdominal pain.   .. Active Ambulatory Problems    Diagnosis Date Noted  . Psoriatic arthritis (HCC) 03/24/2016  . Gastroesophageal reflux disease 03/24/2016  . GAD (generalized anxiety disorder) 03/24/2016  . Iron deficiency anemia 03/24/2016  . History of pulmonary embolus (PE) 03/27/2016  . History of DVT (deep vein thrombosis) 03/27/2016  . Chronic tension-type headache, not intractable 03/27/2016  . Primary insomnia 03/27/2016  . Bilateral lower extremity edema 03/27/2016  . Chronic pain syndrome 05/25/2016  . Morbidly obese (HCC) 05/27/2016  . Serum calcium elevated 08/30/2016  . Diarrhea 08/30/2016  . Collagenous colitis 10/18/2016  . Barrett's esophagus without dysplasia 10/18/2016  . Arthralgia 10/18/2016  . Memory changes 11/23/2016  . Major depressive disorder with current active episode 11/23/2016  . OSA (obstructive sleep apnea) 11/23/2016  . Twitching 12/22/2016  . Upper airway cough syndrome 12/22/2016  . Bilateral hip pain 12/22/2016  . Tripping over things  01/12/2017  . Left arm pain 01/12/2017  . Left elbow pain 01/12/2017  . Numbness and tingling in left arm 01/17/2017  . Word finding difficulty 03/15/2017  . Intractable migraine without aura and with status migrainosus 03/15/2017  . Edema of both feet 07/04/2017  . B12 deficiency 07/04/2017  . Acquired hypothyroidism 07/04/2017  . Vitamin D deficiency 07/04/2017  . Secondary hyperparathyroidism, non-renal (HCC) 07/04/2017  . Psoriasis 07/06/2017  . CFS (chronic fatigue syndrome) 07/06/2017  . Easy bruising 07/26/2017  . Myofascial pain 08/19/2017  . Itching 08/19/2017  . Yeast infection 08/19/2017  . IBS (irritable bowel syndrome) 12/03/2017  . Recurrent falls 12/29/2017  . Weakness 02/11/2018  . 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 Urinary frequency 02/14/2018  . Chronic left shoulder pain 03/15/2018  . Chronic pain of left thumb 03/15/2018  . 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 Jerking 09/12/2018  . Episode of confusion 09/12/2018  . Bilateral leg pain 11/14/2018  . Elevated serum creatinine 11/14/2018  . Dry mouth 11/14/2018  . Bilateral leg weakness 01/23/2019  . Skin infection 01/23/2019  . Chronic nonintractable headache 02/07/2019  . 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  . Intertrigo 08/11/2019  . Melasma 08/11/2019  . Pneumonia of left upper lobe due to Chlamydia species 08/11/2019  . OAB (overactive bladder) 08/11/2019  . DDD (degenerative disc disease), lumbar 01/23/2020  . Osteoarthritis, hip, bilateral 01/23/2020  . Chronic cough 06/24/2020  . Acute cystitis without hematuria 07/10/2020   Resolved Ambulatory Problems    Diagnosis Date Noted  . Hyperparathyroidism, primary (HCC) 09/18/2016  . Rash and nonspecific skin eruption 10/18/2016  .  Acute bronchitis 02/07/2018   Past Medical History:  Diagnosis Date  . History of pulmonary embolism 03/27/2016  . Microscopic colitis   . Migraine   . Morbid obesity (HCC) 05/27/2016     Review of Systems See HPI.      Objective:   Physical Exam Vitals reviewed.  Constitutional:      Appearance: Normal appearance. She is obese.     Comments: In wheelchair.   HENT:     Head: Normocephalic.  Cardiovascular:     Rate and Rhythm: Normal rate and regular rhythm.     Pulses: Normal pulses.  Pulmonary:     Effort: Pulmonary effort is normal.     Breath sounds: Normal breath sounds. No wheezing.  Abdominal:     General: Bowel sounds are normal. There is no distension.     Palpations: Abdomen is soft.     Tenderness: There is no abdominal tenderness. There is no right CVA tenderness, left CVA tenderness or guarding.  Musculoskeletal:     Right lower leg: No edema.     Left lower leg: No edema.  Neurological:     General: No focal deficit present.     Mental Status: She is alert and oriented to person, place, and time.  Psychiatric:        Mood and Affect: Mood normal.     .. Results for orders placed or performed in visit on 06/24/20  POCT URINALYSIS DIP (CLINITEK)  Result Value Ref Range   Color, UA yellow yellow   Clarity, UA cloudy (A) clear   Glucose, UA negative negative mg/dL   Bilirubin, UA negative negative   Ketones, POC UA negative negative mg/dL   Spec Grav, UA 6.720 9.470 - 1.025   Blood, UA negative negative   pH, UA 6.0 5.0 - 8.0   POC PROTEIN,UA negative negative, trace   Urobilinogen, UA 2.0 (A) 0.2 or 1.0 E.U./dL   Nitrite, UA Positive (A) Negative   Leukocytes, UA Small (1+) (A) Negative         Assessment & Plan:  Marland KitchenMarland KitchenOleta was seen today for urinary tract infection.  Diagnoses and all orders for this visit:  Acute cystitis without hematuria -     Urine Culture -     nitrofurantoin, macrocrystal-monohydrate, (MACROBID) 100 MG capsule; Take 1 capsule (100 mg total) by mouth 2 (two) times daily. For 7 days.  Frequency of urination -     POCT URINALYSIS DIP (CLINITEK) -     Urine Culture  Chronic cough -     fluticasone (FLOVENT HFA) 110 MCG/ACT inhaler;  Inhale 2 puffs into the lungs in the morning and at bedtime.  Psoriatic arthritis (HCC) -     DME Wheelchair manual -     HYDROcodone-acetaminophen (NORCO) 10-325 MG tablet; Take 1 tablet by mouth every 12 (twelve) hours. -     HYDROcodone-acetaminophen (NORCO) 10-325 MG tablet; Take 1 tablet by mouth every 12 (twelve) hours as needed. -     HYDROcodone-acetaminophen (NORCO) 10-325 MG tablet; Take 1 tablet by mouth every 12 (twelve) hours as needed.  Class 3 severe obesity due to excess calories with serious comorbidity and body mass index (BMI) of 45.0 to 49.9 in adult (HCC) -     Liraglutide -Weight Management (SAXENDA) 18 MG/3ML SOPN; Inject 3 mg into the skin daily.  Chronic pain syndrome -     HYDROcodone-acetaminophen (NORCO) 10-325 MG tablet; Take 1 tablet by mouth every 12 (twelve) hours. -  HYDROcodone-acetaminophen (NORCO) 10-325 MG tablet; Take 1 tablet by mouth every 12 (twelve) hours as needed. -     HYDROcodone-acetaminophen (NORCO) 10-325 MG tablet; Take 1 tablet by mouth every 12 (twelve) hours as needed.  Gastroesophageal reflux disease with esophagitis without hemorrhage  Chronic nonintractable headache, unspecified headache type -     Atogepant (QULIPTA) 60 MG TABS; Take 60 mg by mouth daily.   Start flovent.  Start macrobid for UTI. UA showed nitrites.  Discussed symptomatic care.  Start quilpta.  dexilant sent to speciality pharmacy to see if can get covered.

## 2020-06-24 NOTE — Patient Instructions (Addendum)
flovent to start 2 puffs twice a day.  macrobid 1 tablet twice a day for 7 days.  Start quilpta.  Will dexilant to replace send to speciality pharmacy.    Urinary Tract Infection, Adult A urinary tract infection (UTI) is an infection of any part of the urinary tract. The urinary tract includes:  The kidneys.  The ureters.  The bladder.  The urethra. These organs make, store, and get rid of pee (urine) in the body. What are the causes? This infection is caused by germs (bacteria) in your genital area. These germs grow and cause swelling (inflammation) of your urinary tract. What increases the risk? The following factors may make you more likely to develop this condition:  Using a small, thin tube (catheter) to drain pee.  Not being able to control when you pee or poop (incontinence).  Being female. If you are female, these things can increase the risk: ? Using these methods to prevent pregnancy:  A medicine that kills sperm (spermicide).  A device that blocks sperm (diaphragm). ? Having low levels of a female hormone (estrogen). ? Being pregnant. You are more likely to develop this condition if:  You have genes that add to your risk.  You are sexually active.  You take antibiotic medicines.  You have trouble peeing because of: ? A prostate that is bigger than normal, if you are female. ? A blockage in the part of your body that drains pee from the bladder. ? A kidney stone. ? A nerve condition that affects your bladder. ? Not getting enough to drink. ? Not peeing often enough.  You have other conditions, such as: ? Diabetes. ? A weak disease-fighting system (immune system). ? Sickle cell disease. ? Gout. ? Injury of the spine. What are the signs or symptoms? Symptoms of this condition include:  Needing to pee right away.  Peeing small amounts often.  Pain or burning when peeing.  Blood in the pee.  Pee that smells bad or not like normal.  Trouble  peeing.  Pee that is cloudy.  Fluid coming from the vagina, if you are female.  Pain in the belly or lower back. Other symptoms include:  Vomiting.  Not feeling hungry.  Feeling mixed up (confused). This may be the first symptom in older adults.  Being tired and grouchy (irritable).  A fever.  Watery poop (diarrhea). How is this treated?  Taking antibiotic medicine.  Taking other medicines.  Drinking enough water. In some cases, you may need to see a specialist. Follow these instructions at home: Medicines  Take over-the-counter and prescription medicines only as told by your doctor.  If you were prescribed an antibiotic medicine, take it as told by your doctor. Do not stop taking it even if you start to feel better. General instructions  Make sure you: ? Pee until your bladder is empty. ? Do not hold pee for a long time. ? Empty your bladder after sex. ? Wipe from front to back after peeing or pooping if you are a female. Use each tissue one time when you wipe.  Drink enough fluid to keep your pee pale yellow.  Keep all follow-up visits.   Contact a doctor if:  You do not get better after 1-2 days.  Your symptoms go away and then come back. Get help right away if:  You have very bad back pain.  You have very bad pain in your lower belly.  You have a fever.  You have chills.  You feeling like you will vomit or you vomit. Summary  A urinary tract infection (UTI) is an infection of any part of the urinary tract.  This condition is caused by germs in your genital area.  There are many risk factors for a UTI.  Treatment includes antibiotic medicines.  Drink enough fluid to keep your pee pale yellow. This information is not intended to replace advice given to you by your health care provider. Make sure you discuss any questions you have with your health care provider. Document Revised: 09/22/2019 Document Reviewed: 09/22/2019 Elsevier Patient  Education  [place].

## 2020-06-25 MED ORDER — HYDROCODONE-ACETAMINOPHEN 10-325 MG PO TABS: 1.0000 | ORAL_TABLET | Freq: Two times a day (BID) | ORAL | 0 refills | Status: DC | PRN

## 2020-06-25 MED ORDER — HYDROCODONE-ACETAMINOPHEN 10-325 MG PO TABS: 1.0000 | ORAL_TABLET | Freq: Two times a day (BID) | ORAL | 0 refills | Status: DC

## 2020-06-26 LAB — URINE CULTURE
MICRO NUMBER:: 11840952
SPECIMEN QUALITY:: ADEQUATE

## 2020-06-26 LAB — HOUSE ACCOUNT TRACKING

## 2020-06-26 NOTE — Progress Notes (Signed)
E.coli found in urine. Should be treated with macrobid.

## 2020-06-28 ENCOUNTER — Encounter: Payer: Self-pay | Admitting: Physician Assistant

## 2020-07-01 MED ORDER — FLUCONAZOLE 150 MG PO TABS: 150.0000 mg | ORAL_TABLET | Freq: Once | ORAL | 0 refills | Status: AC

## 2020-07-02 ENCOUNTER — Encounter: Payer: Self-pay | Admitting: Physician Assistant

## 2020-07-03 MED ORDER — AMPHETAMINE-DEXTROAMPHETAMINE 20 MG PO TABS: 20.0000 mg | ORAL_TABLET | Freq: Two times a day (BID) | ORAL | 0 refills | Status: DC

## 2020-07-03 NOTE — Addendum Note (Signed)
Addended by: Jomarie Longs on: 07/03/2020 03:15 PM   Modules accepted: Orders

## 2020-07-07 ENCOUNTER — Encounter: Payer: Self-pay | Admitting: Physician Assistant

## 2020-07-07 ENCOUNTER — Other Ambulatory Visit: Payer: Self-pay | Admitting: Physician Assistant

## 2020-07-07 DIAGNOSIS — M7918 Myalgia, other site: Secondary | ICD-10-CM

## 2020-07-07 DIAGNOSIS — G894 Chronic pain syndrome: Secondary | ICD-10-CM

## 2020-07-09 MED ORDER — DIVALPROEX SODIUM 250 MG PO DR TAB: 250.0000 mg | DELAYED_RELEASE_TABLET | Freq: Two times a day (BID) | ORAL | 2 refills | Status: DC

## 2020-07-10 DIAGNOSIS — N3000 Acute cystitis without hematuria: Secondary | ICD-10-CM | POA: Insufficient documentation

## 2020-07-11 ENCOUNTER — Other Ambulatory Visit: Payer: Self-pay | Admitting: Neurology

## 2020-07-11 DIAGNOSIS — F329 Major depressive disorder, single episode, unspecified: Secondary | ICD-10-CM

## 2020-07-11 DIAGNOSIS — R053 Chronic cough: Secondary | ICD-10-CM

## 2020-07-11 MED ORDER — BUPROPION HCL ER (XL) 300 MG PO TB24: 300.0000 mg | ORAL_TABLET | Freq: Every day | ORAL | 1 refills | Status: DC

## 2020-07-11 MED ORDER — FLOVENT HFA 110 MCG/ACT IN AERO: 2.0000 | INHALATION_SPRAY | Freq: Two times a day (BID) | RESPIRATORY_TRACT | 1 refills | Status: DC

## 2020-07-12 ENCOUNTER — Other Ambulatory Visit: Payer: Self-pay | Admitting: Neurology

## 2020-07-12 NOTE — Telephone Encounter (Signed)
Last written 05/28/2018 #30 no refills Last appt 06/24/2020

## 2020-07-16 MED ORDER — BUTALBITAL-APAP-CAFF-COD 50-325-40-30 MG PO CAPS: ORAL_CAPSULE | ORAL | 0 refills | Status: DC

## 2020-07-18 ENCOUNTER — Telehealth: Payer: Self-pay | Admitting: Neurology

## 2020-07-18 NOTE — Telephone Encounter (Signed)
Prior Authorization for Adderall submitted via covermymeds. Awaiting response.  

## 2020-07-19 ENCOUNTER — Ambulatory Visit: Payer: BLUE CROSS/BLUE SHIELD

## 2020-07-23 ENCOUNTER — Ambulatory Visit: Payer: PRIVATE HEALTH INSURANCE | Admitting: Physician Assistant

## 2020-07-26 ENCOUNTER — Other Ambulatory Visit: Payer: Self-pay | Admitting: Physician Assistant

## 2020-07-26 ENCOUNTER — Encounter: Payer: Self-pay | Admitting: Physician Assistant

## 2020-07-26 DIAGNOSIS — M79645 Pain in left finger(s): Secondary | ICD-10-CM

## 2020-07-26 DIAGNOSIS — G8929 Other chronic pain: Secondary | ICD-10-CM

## 2020-07-26 MED ORDER — ONDANSETRON 8 MG PO TBDP: 8.0000 mg | ORAL_TABLET | Freq: Three times a day (TID) | ORAL | 1 refills | Status: DC | PRN

## 2020-07-29 ENCOUNTER — Other Ambulatory Visit: Payer: Self-pay | Admitting: Neurology

## 2020-07-29 DIAGNOSIS — Z86711 Personal history of pulmonary embolism: Secondary | ICD-10-CM

## 2020-07-29 MED ORDER — ELIQUIS 5 MG PO TABS: ORAL_TABLET | ORAL | 1 refills | Status: DC

## 2020-08-12 ENCOUNTER — Other Ambulatory Visit: Payer: Self-pay | Admitting: Neurology

## 2020-08-12 DIAGNOSIS — F5101 Primary insomnia: Secondary | ICD-10-CM

## 2020-08-12 DIAGNOSIS — R21 Rash and other nonspecific skin eruption: Secondary | ICD-10-CM

## 2020-08-12 MED ORDER — HYDROXYZINE HCL 50 MG PO TABS: 50.0000 mg | ORAL_TABLET | Freq: Every evening | ORAL | 3 refills | Status: DC | PRN

## 2020-08-12 MED ORDER — ZOLPIDEM TARTRATE 5 MG PO TABS: 5.0000 mg | ORAL_TABLET | Freq: Every day | ORAL | 5 refills | Status: DC

## 2020-08-12 NOTE — Telephone Encounter (Signed)
Last written 01/21/2021 #30 no refills Last appt 06/24/2020

## 2020-08-15 ENCOUNTER — Encounter: Payer: Self-pay | Admitting: Physician Assistant

## 2020-08-15 ENCOUNTER — Other Ambulatory Visit: Payer: Self-pay | Admitting: Physician Assistant

## 2020-08-15 DIAGNOSIS — L4050 Arthropathic psoriasis, unspecified: Secondary | ICD-10-CM

## 2020-08-15 DIAGNOSIS — G894 Chronic pain syndrome: Secondary | ICD-10-CM

## 2020-08-15 DIAGNOSIS — R600 Localized edema: Secondary | ICD-10-CM

## 2020-08-15 DIAGNOSIS — G9332 Myalgic encephalomyelitis/chronic fatigue syndrome: Secondary | ICD-10-CM

## 2020-08-15 DIAGNOSIS — R5382 Chronic fatigue, unspecified: Secondary | ICD-10-CM

## 2020-08-15 DIAGNOSIS — K580 Irritable bowel syndrome with diarrhea: Secondary | ICD-10-CM

## 2020-08-16 MED ORDER — AMPHETAMINE-DEXTROAMPHETAMINE 20 MG PO TABS: 20.0000 mg | ORAL_TABLET | Freq: Two times a day (BID) | ORAL | 0 refills | Status: DC

## 2020-08-21 ENCOUNTER — Encounter: Payer: Self-pay | Admitting: Physician Assistant

## 2020-08-21 DIAGNOSIS — N3281 Overactive bladder: Secondary | ICD-10-CM

## 2020-08-22 ENCOUNTER — Ambulatory Visit: Payer: BLUE CROSS/BLUE SHIELD

## 2020-08-22 DIAGNOSIS — D229 Melanocytic nevi, unspecified: Secondary | ICD-10-CM

## 2020-08-22 DIAGNOSIS — L905 Scar conditions and fibrosis of skin: Secondary | ICD-10-CM

## 2020-08-22 DIAGNOSIS — L814 Other melanin hyperpigmentation: Secondary | ICD-10-CM

## 2020-08-22 DIAGNOSIS — L570 Actinic keratosis: Secondary | ICD-10-CM

## 2020-08-22 DIAGNOSIS — L821 Other seborrheic keratosis: Secondary | ICD-10-CM

## 2020-08-22 DIAGNOSIS — D1801 Hemangioma of skin and subcutaneous tissue: Secondary | ICD-10-CM

## 2020-08-22 DIAGNOSIS — B079 Viral wart, unspecified: Secondary | ICD-10-CM

## 2020-08-22 MED ORDER — TERBINAFINE HCL 250 MG PO TABS: ORAL_TABLET | ORAL | 0 refills | Status: DC

## 2020-08-22 MED ORDER — DIVALPROEX SODIUM 250 MG PO DR TAB: 250.0000 mg | DELAYED_RELEASE_TABLET | Freq: Two times a day (BID) | ORAL | 0 refills | Status: DC

## 2020-08-22 MED ORDER — OXYBUTYNIN CHLORIDE ER 10 MG PO TB24: ORAL_TABLET | ORAL | 0 refills | Status: DC

## 2020-08-22 MED ORDER — BUSPIRONE HCL 15 MG PO TABS: 15.0000 mg | ORAL_TABLET | Freq: Two times a day (BID) | ORAL | 0 refills | Status: DC
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
## 2020-08-23 ENCOUNTER — Other Ambulatory Visit: Payer: Self-pay | Admitting: *Deleted

## 2020-08-23 DIAGNOSIS — Z6841 Body Mass Index (BMI) 40.0 and over, adult: Secondary | ICD-10-CM

## 2020-08-23 MED ORDER — PAROXETINE HCL 10 MG PO TABS
ORAL_TABLET | ORAL | 8 refills | 30.00000 days
Start: 2020-08-23 — End: ?

## 2020-08-23 MED ORDER — SAXENDA 18 MG/3ML ~~LOC~~ SOPN: 3.0000 mg | PEN_INJECTOR | Freq: Every day | SUBCUTANEOUS | 1 refills | Status: DC

## 2020-08-23 NOTE — Telephone Encounter (Signed)
The Sarah Mayer has to be printed and faxed to this [employer]. Can you do that?

## 2020-08-24 MED ORDER — PAROXETINE HCL 10 MG PO TABS
ORAL_TABLET | 2 refills
Start: 2020-08-24 — End: ?

## 2020-08-28 ENCOUNTER — Telehealth: Payer: Self-pay | Admitting: Neurology

## 2020-08-28 MED ORDER — SAXENDA 18 MG/3ML ~~LOC~~ SOPN: 3.0000 mg | PEN_INJECTOR | Freq: Every day | SUBCUTANEOUS | 0 refills | Status: DC

## 2020-08-28 NOTE — Telephone Encounter (Signed)
[facility] sent message that they have to have a 90 day prescription. RX sent.

## 2020-08-30 MED ORDER — PAROXETINE HCL 10 MG PO TABS
ORAL_TABLET | ORAL | 2 refills | 30.00000 days
Start: 2020-08-30 — End: ?

## 2020-09-02 ENCOUNTER — Non-Acute Institutional Stay: Payer: BLUE CROSS/BLUE SHIELD

## 2020-09-02 DIAGNOSIS — F32A Depression, unspecified depression type: Secondary | ICD-10-CM

## 2020-09-04 ENCOUNTER — Non-Acute Institutional Stay: Payer: BLUE CROSS/BLUE SHIELD | Attending: Acute Care

## 2020-09-24 ENCOUNTER — Ambulatory Visit: Payer: BLUE CROSS/BLUE SHIELD | Admitting: Physician Assistant

## 2020-10-02 ENCOUNTER — Telehealth: Payer: BLUE CROSS/BLUE SHIELD

## 2020-10-02 ENCOUNTER — Ambulatory Visit: Payer: BLUE CROSS/BLUE SHIELD

## 2020-10-02 ENCOUNTER — Inpatient Hospital Stay: Payer: BLUE CROSS/BLUE SHIELD | Attending: Gastroenterology

## 2020-10-02 ENCOUNTER — Ambulatory Visit: Payer: BLUE CROSS/BLUE SHIELD | Attending: Student in an Organized Health Care Education/Training Program

## 2020-10-02 DIAGNOSIS — Z131 Encounter for screening for diabetes mellitus: Secondary | ICD-10-CM

## 2020-10-02 DIAGNOSIS — Z1389 Encounter for screening for other disorder: Secondary | ICD-10-CM

## 2020-10-02 DIAGNOSIS — N83201 Unspecified ovarian cyst, right side: Secondary | ICD-10-CM

## 2020-10-02 DIAGNOSIS — Z1211 Encounter for screening for malignant neoplasm of colon: Secondary | ICD-10-CM

## 2020-10-02 DIAGNOSIS — F32A Depression, unspecified depression type: Secondary | ICD-10-CM

## 2020-10-02 DIAGNOSIS — Z01812 Encounter for preprocedural laboratory examination: Secondary | ICD-10-CM

## 2020-10-02 DIAGNOSIS — Z0000 Encounter for general adult medical examination without abnormal findings: Secondary | ICD-10-CM

## 2020-10-02 DIAGNOSIS — E782 Mixed hyperlipidemia: Secondary | ICD-10-CM

## 2020-10-02 DIAGNOSIS — Z1159 Encounter for screening for other viral diseases: Secondary | ICD-10-CM

## 2020-10-02 MED ORDER — SUPREP BOWEL PREP KIT 17.5-3.13-1.6 GM/177ML PO SOLN
0 refills | Status: AC
Start: 2020-10-02 — End: ?

## 2020-10-02 MED ORDER — PAROXETINE HCL 10 MG PO TABS
10 mg | ORAL_TABLET | Freq: Every day | ORAL | 0 refills | 30.00000 days | Status: AC
Start: 2020-10-02 — End: ?
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
## 2020-10-02 NOTE — Telephone Encounter
MPU Procedure Checklist:     [x]  COVID Order has been pended and sent to MD performing procedure to sign.     [x] Open access patient- Pharmacy on file has been confirmed and bowel prep medication has been pended.    Prep instructions have been delivered to patient via:  [] Email to __________  [] Fax to ____________  [] Mail to Address on file  [x] Sent via letter on Mychart  [] Preps provided by the office      Encounter sent to FCU team if the following applies:  [] Procedure scheduled on:  [] Changes have been made to current procedure scheduled.       MAC Script    Patient has been advised of method of anesthesia that will be used in this procedure.  If MAC used, MAC script has been advised.  If IVCS used, pt given option of MAC.     [x] Pt has been offered all options and is requesting to be scheduled with MAC. Patient will pay $200 if authorization is denied.    [] Pt has been scheduled in  on sedation day (if insurance applicable).      [] Patient has requested to be scheduled with conscious sedation in one of our community Medical procedure units.

## 2020-10-03 ENCOUNTER — Non-Acute Institutional Stay: Payer: BLUE CROSS/BLUE SHIELD

## 2020-10-03 LAB — HCV Ab Screen: HCV ANTIBODY SCREEN: NONREACTIVE

## 2020-10-03 LAB — Lipid Panel: CHOLESTEROL, HDL: 55 mg/dL (ref >50–<150)

## 2020-10-03 LAB — TSH with reflex FT4, FT3: TSH: 1.8 u[IU]/mL (ref 0.3–4.7)

## 2020-10-03 LAB — Comprehensive Metabolic Panel
ALANINE AMINOTRANSFERASE: 19 U/L (ref 8–70)
SODIUM: 137 mmol/L (ref 135–146)

## 2020-10-03 LAB — Hgb A1c: HGB A1C - HPLC: 5.7 — ABNORMAL HIGH (ref <5.7)

## 2020-10-03 LAB — CBC: HEMATOCRIT: 43.3 (ref 34.9–45.2)

## 2020-10-03 LAB — Differential Automated: ABSOLUTE IMMATURE GRAN COUNT: 0.02 10*3/uL (ref 0.00–0.04)

## 2020-10-03 LAB — HBs Ag: HEPATITIS B SURFACE ANTIGEN: NONREACTIVE

## 2020-10-30 ENCOUNTER — Non-Acute Institutional Stay: Payer: BLUE CROSS/BLUE SHIELD | Attending: Student in an Organized Health Care Education/Training Program

## 2020-10-31 ENCOUNTER — Telehealth: Payer: BLUE CROSS/BLUE SHIELD

## 2020-10-31 DIAGNOSIS — F32A Depression, unspecified depression type: Secondary | ICD-10-CM

## 2020-10-31 MED ORDER — PAROXETINE HCL 10 MG PO TABS
10 mg | ORAL_TABLET | Freq: Every day | ORAL | 0 refills
Start: 2020-10-31 — End: ?

## 2020-10-31 NOTE — Telephone Encounter
Pt called in to request Rx to be sent to alternative pharmacy.     Rx pended to correct pharmacy

## 2020-11-04 MED ORDER — PAROXETINE HCL 10 MG PO TABS
10 mg | ORAL_TABLET | Freq: Every day | ORAL | 0 refills | 30.00000 days | Status: AC
Start: 2020-11-04 — End: ?

## 2020-11-13 ENCOUNTER — Ambulatory Visit: Payer: BLUE CROSS/BLUE SHIELD | Admitting: Physician Assistant

## 2020-11-27 ENCOUNTER — Ambulatory Visit: Payer: BLUE CROSS/BLUE SHIELD

## 2020-11-27 ENCOUNTER — Telehealth: Payer: BLUE CROSS/BLUE SHIELD

## 2020-11-27 NOTE — Telephone Encounter
Confirmation Documentation   (left msg) procedure(s)   Date: 12/04/2020  Time: 0915  Check-in Time: 0815  NOTE:  [facility]: Check in time is 1.5 hours prior to procedure in [address].   [place] [facility]: If patient is scheduled at 7am, Check in time is at 6:15am        Colonoscopy  X   Endoscopy     Bravo    Cath Placement    Sigmoidoscopy     Small Bowel Entero.    Esophageal Manometry    Anorectal Manometry    Biofeedback    pH study    Capsule Endoscopy    Secretin Infusion Test    Sham Feeding Study        Informed pt:  1. Of Arrival time (time varies based on location)?  2. Of location and room number?  3. Make sure there is a confirmed ride for the procedure.  4. Has procedure been authorized?  5. Was MAC Script provided?   6. Reviewed Prep instructions with patient? Y/N?  7. Has COVID appt been scheduled?N  8. Scheduled procedure and physician's order match? Y/N?  9. Informed patient to call back with any questions and provided number.Y/N?    NOTE: If patients have any questions regarding prescribed medication patient needs to contact their prescribing physician to ensure it is ok to stop medications.      Comments/Notes:        If VM is left and pt calls back, please warm transfer patient to [phone]. This number is for internal use only and is not to be provided to patients.

## 2021-01-01 ENCOUNTER — Other Ambulatory Visit: Payer: Self-pay

## 2021-01-01 ENCOUNTER — Encounter: Payer: Self-pay | Admitting: Physician Assistant

## 2021-01-01 ENCOUNTER — Ambulatory Visit (INDEPENDENT_AMBULATORY_CARE_PROVIDER_SITE_OTHER): Payer: Self-pay | Admitting: Physician Assistant

## 2021-01-01 VITALS — BP 124/63 | HR 98

## 2021-01-01 DIAGNOSIS — E559 Vitamin D deficiency, unspecified: Secondary | ICD-10-CM

## 2021-01-01 DIAGNOSIS — G894 Chronic pain syndrome: Secondary | ICD-10-CM

## 2021-01-01 DIAGNOSIS — M79645 Pain in left finger(s): Secondary | ICD-10-CM

## 2021-01-01 DIAGNOSIS — M7918 Myalgia, other site: Secondary | ICD-10-CM

## 2021-01-01 DIAGNOSIS — N3281 Overactive bladder: Secondary | ICD-10-CM

## 2021-01-01 DIAGNOSIS — R053 Chronic cough: Secondary | ICD-10-CM

## 2021-01-01 DIAGNOSIS — L409 Psoriasis, unspecified: Secondary | ICD-10-CM

## 2021-01-01 DIAGNOSIS — M25572 Pain in left ankle and joints of left foot: Secondary | ICD-10-CM

## 2021-01-01 DIAGNOSIS — E039 Hypothyroidism, unspecified: Secondary | ICD-10-CM

## 2021-01-01 DIAGNOSIS — G43011 Migraine without aura, intractable, with status migrainosus: Secondary | ICD-10-CM

## 2021-01-01 DIAGNOSIS — G8929 Other chronic pain: Secondary | ICD-10-CM

## 2021-01-01 DIAGNOSIS — J209 Acute bronchitis, unspecified: Secondary | ICD-10-CM

## 2021-01-01 DIAGNOSIS — R682 Dry mouth, unspecified: Secondary | ICD-10-CM

## 2021-01-01 DIAGNOSIS — G44229 Chronic tension-type headache, not intractable: Secondary | ICD-10-CM

## 2021-01-01 DIAGNOSIS — Z13220 Encounter for screening for lipoid disorders: Secondary | ICD-10-CM

## 2021-01-01 DIAGNOSIS — M79642 Pain in left hand: Secondary | ICD-10-CM

## 2021-01-01 DIAGNOSIS — Z131 Encounter for screening for diabetes mellitus: Secondary | ICD-10-CM

## 2021-01-01 DIAGNOSIS — Z1211 Encounter for screening for malignant neoplasm of colon: Secondary | ICD-10-CM

## 2021-01-01 DIAGNOSIS — L4050 Arthropathic psoriasis, unspecified: Secondary | ICD-10-CM

## 2021-01-01 DIAGNOSIS — Z23 Encounter for immunization: Secondary | ICD-10-CM

## 2021-01-01 DIAGNOSIS — K2100 Gastro-esophageal reflux disease with esophagitis, without bleeding: Secondary | ICD-10-CM

## 2021-01-01 DIAGNOSIS — G9332 Myalgic encephalomyelitis/chronic fatigue syndrome: Secondary | ICD-10-CM

## 2021-01-01 DIAGNOSIS — R21 Rash and other nonspecific skin eruption: Secondary | ICD-10-CM

## 2021-01-01 DIAGNOSIS — F5101 Primary insomnia: Secondary | ICD-10-CM

## 2021-01-01 DIAGNOSIS — F410 Panic disorder [episodic paroxysmal anxiety] without agoraphobia: Secondary | ICD-10-CM

## 2021-01-01 DIAGNOSIS — G4733 Obstructive sleep apnea (adult) (pediatric): Secondary | ICD-10-CM

## 2021-01-01 DIAGNOSIS — F329 Major depressive disorder, single episode, unspecified: Secondary | ICD-10-CM

## 2021-01-01 DIAGNOSIS — R202 Paresthesia of skin: Secondary | ICD-10-CM

## 2021-01-01 MED ORDER — GABAPENTIN 600 MG PO TABS
600.0000 mg | ORAL_TABLET | Freq: Three times a day (TID) | ORAL | 5 refills | Status: DC
Start: 2021-01-01 — End: 2021-02-10

## 2021-01-01 MED ORDER — ZOLPIDEM TARTRATE 5 MG PO TABS: 5.0000 mg | ORAL_TABLET | Freq: Every day | ORAL | 5 refills | Status: DC

## 2021-01-01 MED ORDER — SILVER SULFADIAZINE 1 % EX CREA: 1.0000 "application " | TOPICAL_CREAM | Freq: Every day | CUTANEOUS | 2 refills | Status: DC

## 2021-01-01 MED ORDER — AMPHETAMINE-DEXTROAMPHETAMINE 20 MG PO TABS
20.0000 mg | ORAL_TABLET | Freq: Two times a day (BID) | ORAL | 0 refills | Status: DC
Start: 2021-01-01 — End: 2021-04-01

## 2021-01-01 MED ORDER — HYDROXYZINE HCL 50 MG PO TABS: 50.0000 mg | ORAL_TABLET | Freq: Every evening | ORAL | 3 refills | Status: DC | PRN

## 2021-01-01 MED ORDER — CYCLOBENZAPRINE HCL 10 MG PO TABS: ORAL_TABLET | ORAL | 3 refills | Status: DC

## 2021-01-01 MED ORDER — OXYBUTYNIN CHLORIDE ER 10 MG PO TB24: ORAL_TABLET | ORAL | 11 refills | Status: DC

## 2021-01-01 MED ORDER — DICLOFENAC SODIUM 1 % EX GEL: CUTANEOUS | 3 refills | Status: DC

## 2021-01-01 MED ORDER — AMBULATORY NON FORMULARY MEDICATION: 0 refills | Status: DC

## 2021-01-01 MED ORDER — BUTALBITAL-APAP-CAFF-COD 50-325-40-30 MG PO CAPS: ORAL_CAPSULE | ORAL | 0 refills | Status: DC

## 2021-01-01 MED ORDER — FLUTICASONE PROPIONATE 50 MCG/ACT NA SUSP: NASAL | 11 refills | Status: AC

## 2021-01-01 MED ORDER — DIVALPROEX SODIUM 250 MG PO DR TAB
250.0000 mg | DELAYED_RELEASE_TABLET | Freq: Two times a day (BID) | ORAL | 11 refills | Status: DC
Start: 2021-01-01 — End: 2022-02-02

## 2021-01-01 MED ORDER — BUPROPION HCL ER (XL) 300 MG PO TB24: 300.0000 mg | ORAL_TABLET | Freq: Every day | ORAL | 11 refills | Status: DC

## 2021-01-01 MED ORDER — DEXLANSOPRAZOLE 60 MG PO CPDR: 60.0000 mg | DELAYED_RELEASE_CAPSULE | Freq: Every day | ORAL | 11 refills | Status: DC

## 2021-01-01 MED ORDER — ONDANSETRON 8 MG PO TBDP: 8.0000 mg | ORAL_TABLET | Freq: Three times a day (TID) | ORAL | 1 refills | Status: DC | PRN

## 2021-01-01 MED ORDER — BUSPIRONE HCL 15 MG PO TABS
15.0000 mg | ORAL_TABLET | Freq: Two times a day (BID) | ORAL | 11 refills | Status: DC
Start: 2021-01-01 — End: 2021-07-14

## 2021-01-01 MED ORDER — FAMOTIDINE 40 MG PO TABS: ORAL_TABLET | ORAL | 11 refills | Status: DC

## 2021-01-01 MED ORDER — LEVOCETIRIZINE DIHYDROCHLORIDE 5 MG PO TABS: ORAL_TABLET | ORAL | 11 refills | Status: DC

## 2021-01-01 NOTE — Patient Instructions (Signed)
°Dupuytren's Contracture °Dupuytren's contracture is a condition in which tissue under the skin of the palm becomes thick. This causes one or more of the fingers to curl inward (contract) toward the palm. After a while, the fingers may not be able to straighten out. This condition affects some or all of the fingers and the palm of the hand. This condition may affect one or both hands. °Dupuytren's contracture is a long-term (chronic) condition that develops (progresses) slowly over time. There is no cure, but symptoms can be managed and progression can be slowed with treatment. This condition is usually not dangerous or painful, but it can interfere with everyday tasks. °What are the causes? °This condition is caused by tissue (fascia) in the palm that gets thicker and tighter. When the fascia thickens, it pulls on the cords of tissue (tendons) that control finger movement. This causes the fingers to contract. °The cause of fascia thickening is not known. However, the condition is often passed along from parent to child (inherited). °What increases the risk? °The following factors may make you more likely to develop this condition: °Being 49 years of age or older. °Being female. °Having a family history of this condition. °Using tobacco products, including cigarettes, chewing tobacco, and e-cigarettes. °Drinking alcohol excessively. °Having diabetes. °Having a seizure disorder. °What are the signs or symptoms? °Early symptoms of this condition may include: °Thick, puckered skin on the hand. °One or more lumps (nodules) on the palm. Nodules may be tender when they first appear, but they are generally painless. °Later symptoms of this condition may include: °Thick cords of tissue in the palm. °Fingers curled up toward the palm. °Inability to straighten the fingers into their normal position. °Though this condition is usually painless, you may have discomfort when holding or grabbing objects. °How is this  diagnosed? °This condition is diagnosed with a physical exam, which may include: °Looking at your hands and feeling your palms. This is to check for thickened fascia and nodules. °Measuring finger motion. °Doing the [place] tabletop test. You may be asked to try to put your hand on a surface, with your palm down and your fingers straight out. °How is this treated? °There is no cure for this condition, but treatment can relieve discomfort and make symptoms more manageable. Treatment options may include: °Physical therapy. This can strengthen your hand and increase flexibility. °Occupational therapy. This can help you with everyday tasks that may be more difficult because of your condition. °Shots (injections). Substances may be injected into your hand, such as: °Medicines that help to decrease swelling (corticosteroids). °Proteins (collagenase) to weaken thick tissue. After a collagenase injection, your health care provider may stretch your fingers. °Needle aponeurotomy. A needle is pushed through the skin and into the fascia. Moving the needle against the fascia can weaken or break up the thick tissue. °Surgery. This may be needed if your condition causes discomfort or interferes with everyday activities. Physical therapy is usually needed after surgery. °No treatment is guaranteed to cure this condition. Recurrence of symptoms is common. °Follow these instructions at home: °Hand care °Take these actions to help protect your hand from possible injury: °Use tools that have padded grips. °Wear protective gloves while you work with your hands. °Avoid repetitive hand movements. °General instructions °Take over-the-counter and prescription medicines only as told by your health care provider. °Manage any other conditions that you have, such as diabetes. °If physical therapy was prescribed, do exercises as told by your health care provider. °Do not use   any products that contain nicotine or tobacco, such as cigarettes,  e-cigarettes, and chewing tobacco. If you need help quitting, ask your health care provider. °If you drink alcohol: °Limit how much you have to: °0-1 drink a day for women who are not pregnant. °0-2 drinks a day for men. °Be aware of how much alcohol is in your drink. In the [place] one drink equals one 12 oz bottle of beer (355 mL), one 5 oz glass of wine (148 mL), or one 1½ oz glass of hard liquor (44 mL). °Keep all follow-up visits as told by your health care provider. This is important. °Contact a health care provider if: °You develop new symptoms, or your symptoms get worse. °You have pain that gets worse or does not get better with medicine. °You have difficulty or discomfort with everyday tasks. °You develop numbness or tingling. °Get help right away if: °You have severe pain. °Your fingers change color or become unusually cold. °Summary °Dupuytren's contracture is a condition in which tissue under the skin of the palm becomes thick. °This condition is caused by tissue (fascia) that thickens. When it thickens, it pulls on the cords of tissue (tendons) that control finger movement and makes the fingers contract. °You are more likely to develop this condition if you are a man, are over 49 years of age, have a family history of the condition, and drink a lot of alcohol. °This condition can be treated with physical and occupational therapy, injections, and surgery. Follow instructions about how to care for your hand. °Get help right away if you have severe pain or your fingers change color or become cold. °This information is not intended to replace advice given to you by your health care provider. Make sure you discuss any questions you have with your health care provider. °Document Revised: 05/21/2020 Document Reviewed: 05/21/2020 °Elsevier Patient Education © 2022 [employer] Inc[employer] ° °

## 2021-01-01 NOTE — Progress Notes (Signed)
P  Subjective:    Patient ID: Sarah Mayer, female    DOB: 07-Jul-1970, 50 y.o.   MRN: [id number]  HPI Pt is a 50 yo obese female with extensive past medical history who presents to the clinic for follow up and medication refills.   Migraines- continues to have them. Has been out of medication. Would like to restart. Fioricet does help.   RA- changed biologics from rheumatology but does not seem to be helping.   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   Barretts esophagus/GERD- on dexilant. Needs rx.   CFS- on adderall. Helps some. Continues to battle fatigue every day.   Chronic left ankle pain continues. Needs referral.   Insomnia-needs ambien refilled.   Having issues with really dry mouth. Wonders if it could be more. Hx of other autoimmune diseases.   .. Active Ambulatory Problems    Diagnosis Date Noted   Psoriatic arthritis ([place]) 03/24/2016   Gastroesophageal reflux disease 03/24/2016   GAD (generalized anxiety disorder) 03/24/2016   Iron deficiency anemia 03/24/2016   History of pulmonary embolus (PE) 03/27/2016   History of DVT (deep vein thrombosis) 03/27/2016   Chronic tension-type headache, not intractable 03/27/2016   Primary insomnia 03/27/2016   Bilateral lower extremity edema 03/27/2016   Chronic pain syndrome 05/25/2016   Morbidly obese (HCC) 05/27/2016   Serum calcium elevated 08/30/2016   Diarrhea 08/30/2016   Collagenous colitis 10/18/2016   Barrett's esophagus without dysplasia 10/18/2016   Arthralgia 10/18/2016   Memory changes 11/23/2016   Major depressive disorder with current active episode 11/23/2016   OSA (obstructive sleep apnea) 11/23/2016   Twitching 12/22/2016   Upper airway cough syndrome 12/22/2016   Bilateral hip pain 12/22/2016   Tripping over things 01/12/2017   Left arm pain 01/12/2017   Left elbow pain 01/12/2017   Numbness and tingling in left arm 01/17/2017   Word finding difficulty 03/15/2017   Intractable migraine without  aura and with status migrainosus 03/15/2017   Edema of both feet 07/04/2017   B12 deficiency 07/04/2017   Acquired hypothyroidism 07/04/2017   Vitamin D deficiency 07/04/2017   Secondary hyperparathyroidism, non-renal ([place]) 07/04/2017   Psoriasis 07/06/2017   CFS (chronic fatigue syndrome) 07/06/2017   Easy bruising 07/26/2017   Myofascial pain 08/19/2017   Itching 08/19/2017   Yeast infection 08/19/2017   IBS (irritable bowel syndrome) 12/03/2017   Recurrent falls 12/29/2017   Weakness 02/11/2018   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   Urinary frequency 02/14/2018   Chronic left shoulder pain 03/15/2018   Chronic pain of left thumb 03/15/2018   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   Jerking 09/12/2018   Episode of confusion 09/12/2018   Bilateral leg pain 11/14/2018   Dry mouth 11/14/2018   Bilateral leg weakness 01/23/2019   Skin infection 01/23/2019   Chronic nonintractable headache 02/07/2019   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   Intertrigo 08/11/2019   Melasma 08/11/2019   OAB (overactive bladder) 08/11/2019   DDD (degenerative disc disease), lumbar 01/23/2020   Osteoarthritis, hip, bilateral 01/23/2020   Cough, persistent 06/24/2020   Resolved Ambulatory Problems    Diagnosis Date Noted   Hyperparathyroidism, primary ([place]) 09/18/2016   Rash and nonspecific skin eruption 10/18/2016   Acute bronchitis 02/07/2018   Elevated serum creatinine 11/14/2018   Pneumonia of left upper lobe due to Chlamydia species 08/11/2019   Acute cystitis without hematuria 07/10/2020   Past Medical History:  Diagnosis Date   History of pulmonary embolism 03/27/2016   Microscopic colitis    Migraine  Morbid obesity ([place]) 05/27/2016     Review of Systems See HPI.     Objective:   Physical Exam Vitals reviewed.  Constitutional:      Appearance: She is obese.     Comments: In a wheelchair  HENT:     Head: Normocephalic.  Cardiovascular:     Rate and Rhythm: Normal rate and regular rhythm.      Pulses: Normal pulses.     Heart sounds: Normal heart sounds.  Pulmonary:     Effort: Pulmonary effort is normal.     Breath sounds: Normal breath sounds.  Musculoskeletal:     Right lower leg: Edema present.     Left lower leg: Edema present.  Neurological:     General: No focal deficit present.     Mental Status: She is alert and oriented to person, place, and time.  Psychiatric:        Mood and Affect: Mood normal.     .. Depression screen [facility] 2/9 01/01/2021 01/22/2020 08/07/2019 02/03/2019 11/04/2018  Decreased Interest 1 1 1 1 1   Down, Depressed, Hopeless 3 2 3 2 2   PHQ - 2 Score 4 3 4 3 3   Altered sleeping 3 1 3 3 2   Tired, decreased energy 3 3 3 3 3   Change in appetite 1 1 2 1 1   Feeling bad or failure about yourself  1 1 0 1 0  Trouble concentrating 3 3 2 3 2   Moving slowly or fidgety/restless 1 1 1  0 1  Suicidal thoughts 0 0 0 0 0  PHQ-9 Score 16 13 15 14 12   Difficult doing work/chores Extremely dIfficult Extremely dIfficult Extremely dIfficult Extremely dIfficult Not difficult at all  Some recent data might be hidden   .[name] GAD 7 : Generalized Anxiety Score 01/01/2021 01/22/2020 08/07/2019 02/03/2019  Nervous, Anxious, on Edge 3 3 3 3   Control/stop worrying 3 3 3 1   Worry too much - different things 3 3 3 3   Trouble relaxing 3 3 3 3   Restless 1 1 2 1   Easily annoyed or irritable 2 2 3 2   Afraid - awful might happen 2 1 2 1   Total GAD 7 Score 17 16 19 14   Anxiety Difficulty Extremely difficult Somewhat difficult Extremely difficult Somewhat difficult         Assessment & Plan:  Marland KitchenMarland KitchenCarizma was seen today for follow-up.  Diagnoses and all orders for this visit:  Myofascial pain -     cyclobenzaprine (FLEXERIL) 10 MG tablet; TAKE 1 TABLET(10 MG) BY MOUTH THREE TIMES DAILY AS NEEDED FOR MUSCLE SPASMS -     gabapentin (NEURONTIN) 600 MG tablet; Take 1 tablet (600 mg total) by mouth 3 (three) times daily. -     PTH, Intact and Calcium  Lipid screening -      Lipid Panel w/reflex Direct LDL  Diabetes mellitus screening -     COMPLETE METABOLIC PANEL WITH GFR  Flu vaccine need -     Flu Vaccine QUAD 98mo+IM (Fluarix, Fluzone & Alfiuria Quad PF)  Intractable migraine without aura and with status migrainosus -     butalbital-acetaminophen-caffeine (FIORICET WITH CODEINE) 50-325-40-30 MG capsule; TAKE 1 CAPSULE BY MOUTH EVERY 4 HOURS AS NEEDED FOR HEADACHE -     ondansetron (ZOFRAN-ODT) 8 MG disintegrating tablet; Take 1 tablet (8 mg total) by mouth every 8 (eight) hours as needed for nausea or vomiting. (Patient not taking: Reported on 01/31/2021) -     divalproex (DEPAKOTE) 250 MG  DR tablet; Take 1 tablet (250 mg total) by mouth 2 (two) times daily. (Patient not taking: Reported on 01/31/2021)  OSA (obstructive sleep apnea)  Acquired hypothyroidism -     TSH -     PTH, Intact and Calcium  Primary insomnia -     zolpidem (AMBIEN) 5 MG tablet; Take 1 tablet (5 mg total) by mouth at bedtime.  OAB (overactive bladder) -     oxybutynin (DITROPAN-XL) 10 MG 24 hr tablet; TAKE 1 TABLET(10 MG) BY MOUTH AT BEDTIME (Patient not taking: Reported on 01/31/2021)  Chronic pain of left thumb -     diclofenac Sodium (VOLTAREN) 1 % GEL; APPLY 4 GRAMS EXTERNALLY TO THE AFFECTED AREA FOUR TIMES DAILY AS NEEDED  Chronic pain of left ankle -     diclofenac Sodium (VOLTAREN) 1 % GEL; APPLY 4 GRAMS EXTERNALLY TO THE AFFECTED AREA FOUR TIMES DAILY AS NEEDED -     Ambulatory referral to Podiatry  Chronic pain syndrome -     gabapentin (NEURONTIN) 600 MG tablet; Take 1 tablet (600 mg total) by mouth 3 (three) times daily. -     PTH, Intact and Calcium -     AMBULATORY NON FORMULARY MEDICATION; Power wheelchair due to not being able to ambulate.  Rash and nonspecific skin eruption -     Allergy Panel 11, [facility] -     hydrOXYzine (ATARAX/VISTARIL) 50 MG tablet; Take 1 tablet (50 mg total) by mouth at bedtime as needed for itching (may repeat once as needed). -      Sjogren's syndrome antibods(ssa + ssb) -     Interpretation:  CFS (chronic fatigue syndrome) -     amphetamine-dextroamphetamine (ADDERALL) 20 MG tablet; Take 1 tablet (20 mg total) by mouth 2 (two) times daily. -     B12 and Folate Panel -     PTH, Intact and Calcium  Major depressive disorder with current active episode, unspecified depression episode severity, unspecified whether recurrent -     buPROPion (WELLBUTRIN XL) 300 MG 24 hr tablet; Take 1 tablet (300 mg total) by mouth daily.  Gastroesophageal reflux disease with esophagitis, unspecified whether hemorrhage -     dexlansoprazole (DEXILANT) 60 MG capsule; Take 1 capsule (60 mg total) by mouth daily. (Patient not taking: Reported on 01/31/2021)  Cough, persistent -     famotidine (PEPCID) 40 MG tablet; TAKE 1 TABLET(40 MG) BY MOUTH TWICE DAILY (Patient not taking: Reported on 01/31/2021) -     levocetirizine (XYZAL) 5 MG tablet; TAKE 1 TABLET(5 MG) BY MOUTH EVERY EVENING  Acute bronchitis, unspecified organism -     fluticasone (FLONASE) 50 MCG/ACT nasal spray; One spray in each nostril twice a day, use left hand for right nostril, and right hand for left nostril. -     levocetirizine (XYZAL) 5 MG tablet; TAKE 1 TABLET(5 MG) BY MOUTH EVERY EVENING  Scalp psoriasis  Psoriatic arthritis (HCC)  Chronic tension-type headache, not intractable  Panic attacks -     busPIRone (BUSPAR) 15 MG tablet; Take 1 tablet (15 mg total) by mouth 2 (two) times daily. (Patient not taking: Reported on 01/31/2021)  Dry mouth -     Sjogren's syndrome antibods(ssa + ssb)  Vitamin D deficiency -     VITAMIN D 25 Hydroxy (Vit-D Deficiency, Fractures)  Paresthesia -     B12 and Folate Panel -     PTH, Intact and Calcium -     AMBULATORY NON FORMULARY MEDICATION; Power wheelchair due  to not being able to ambulate.  Colon cancer screening -     Ambulatory referral to Gastroenterology  Left hand pain  Other orders -     Discontinue:  AMBULATORY NON FORMULARY MEDICATION; Power wheelchair due to not being able to ambulate. -     silver sulfADIAZINE (SILVADENE) 1 % cream; Apply 1 application topically daily. Apply to deep burns (Patient not taking: Reported on 01/31/2021)   Ok with colonoscopy referral.  Will order screening labs and add some evaluate for dry mouth.  Refilled adderall for CFS.  Refilled all ongoing medications needed.  Follow up in 3 months.

## 2021-01-02 ENCOUNTER — Encounter: Payer: Self-pay | Admitting: Physician Assistant

## 2021-01-02 LAB — ALLERGY PANEL 11, MOLD GROUP
Allergen, A. alternata, m6: <0.1 kU/L
Allergen, Mucor Racemosus, M4: <0.1 kU/L
Aspergillus fumigatus, m3: <0.1 kU/L
CLADOSPORIUM HERBARUM (M2) IGE: <0.1 kU/L
CLASS: 0
CLASS: 0
Candida Albicans: <0.1 kU/L
Class: 0
Class: 0
Class: 0

## 2021-01-02 LAB — COMPLETE METABOLIC PANEL WITH GFR
AG Ratio: 1.6 (calc) (ref 1.0–2.5)
ALT: 19 U/L (ref 6–29)
AST: 19 U/L (ref 10–35)
Albumin: 4.1 g/dL (ref 3.6–5.1)
Alkaline phosphatase (APISO): 159 U/L — ABNORMAL HIGH (ref 31–125)
BUN: 14 mg/dL (ref 7–25)
CO2: 20 mmol/L (ref 20–32)
Calcium: 9.7 mg/dL (ref 8.6–10.2)
Chloride: 105 mmol/L (ref 98–110)
Creat: 0.83 mg/dL (ref 0.50–0.99)
Globulin: 2.6 g/dL (calc) (ref 1.9–3.7)
Glucose, Bld: 98 mg/dL (ref 65–99)
Potassium: 4.5 mmol/L (ref 3.5–5.3)
Sodium: 140 mmol/L (ref 135–146)
Total Bilirubin: 0.3 mg/dL (ref 0.2–1.2)
Total Protein: 6.7 g/dL (ref 6.1–8.1)
eGFR: 86 mL/min/{1.73_m2} (ref >=60)

## 2021-01-02 LAB — VITAMIN D 25 HYDROXY (VIT D DEFICIENCY, FRACTURES): Vit D, 25-Hydroxy: 90 ng/mL (ref 30–100)

## 2021-01-02 LAB — B12 AND FOLATE PANEL
Folate: 11.9 ng/mL
Vitamin B-12: 360 pg/mL (ref 200–1100)

## 2021-01-02 LAB — PTH, INTACT AND CALCIUM
Calcium: 9.6 mg/dL (ref 8.6–10.2)
PTH: 109 pg/mL — ABNORMAL HIGH (ref 16–77)

## 2021-01-02 LAB — LIPID PANEL W/REFLEX DIRECT LDL
Cholesterol: 175 mg/dL (ref <200)
HDL: 55 mg/dL (ref >=50)
LDL Cholesterol (Calc): 105 mg/dL (calc) — ABNORMAL HIGH
Non-HDL Cholesterol (Calc): 120 mg/dL (calc) (ref <130)
Total CHOL/HDL Ratio: 3.2 (calc) (ref <5.0)
Triglycerides: 68 mg/dL (ref <150)

## 2021-01-02 LAB — SJOGREN'S SYNDROME ANTIBODS(SSA + SSB)
SSA (Ro) (ENA) Antibody, IgG: <1 AI
SSB (La) (ENA) Antibody, IgG: <1 AI

## 2021-01-02 LAB — INTERPRETATION:

## 2021-01-02 LAB — TSH: TSH: 1.41 mIU/L

## 2021-01-02 MED ORDER — PAROXETINE HCL 10 MG PO TABS
ORAL_TABLET | ORAL | 1.00 refills | 30.00000 days
Start: 2021-01-02 — End: ?

## 2021-01-02 NOTE — Progress Notes (Signed)
Sjogrens autoimmune antibodies negative.

## 2021-01-02 NOTE — Progress Notes (Signed)
Sarah Mayer,   Vitamin D looks great.  B12 a little low normal.  Thyroid looks good.  No mold allergies.  Cholesterol looks good.  Calcium normal PTH high. When was the last time you followed up with rheumatology about this?

## 2021-01-06 MED ORDER — PAROXETINE HCL 10 MG PO TABS
ORAL_TABLET | 1 refills | Status: AC
Start: 2021-01-06 — End: ?

## 2021-01-06 MED ORDER — PAROXETINE HCL 10 MG PO TABS
10 mg | ORAL_TABLET | Freq: Every day | ORAL | 0 refills | Status: AC
Start: 2021-01-06 — End: ?

## 2021-01-13 ENCOUNTER — Encounter: Payer: Self-pay | Admitting: Physician Assistant

## 2021-01-13 DIAGNOSIS — L4050 Arthropathic psoriasis, unspecified: Secondary | ICD-10-CM

## 2021-01-13 DIAGNOSIS — G894 Chronic pain syndrome: Secondary | ICD-10-CM

## 2021-01-14 MED ORDER — HYDROCODONE-ACETAMINOPHEN 10-325 MG PO TABS: 1.0000 | ORAL_TABLET | Freq: Two times a day (BID) | ORAL | 0 refills | Status: DC

## 2021-01-17 ENCOUNTER — Encounter: Payer: Self-pay | Admitting: Physician Assistant

## 2021-01-23 ENCOUNTER — Encounter: Payer: Self-pay | Admitting: Physician Assistant

## 2021-01-30 ENCOUNTER — Telehealth: Payer: Medicaid Other | Admitting: Physician Assistant

## 2021-01-30 DIAGNOSIS — R0602 Shortness of breath: Secondary | ICD-10-CM

## 2021-01-30 NOTE — Progress Notes (Signed)
Based on what you shared with me, I feel your condition warrants further evaluation and I recommend that you be seen in a face to face visit.  Giving duration of symptoms and frequent shortness of breath, you will need to be evaluated in person to get a detailed lung exam and assessment, so the proper treatment can be given.    NOTE: There will be NO CHARGE for this eVisit   If you are having a true medical emergency please call 911.      For an urgent face to face visit, [facility] has six urgent care centers for your convenience:     [facility] at [facility] Directions [phone] [address] [place], [place] [address]    [facility] Health Urgent Care Center [facility]) Get Driving Directions [phone] [address] [place], [place] [address]  [facility] Health Urgent Care Center [facility] [place]) Get Driving Directions [phone] [address] [place],  [place]  [address]  [facility] Health Urgent Care at [facility] Get Driving Directions [phone] [address], [address] [place], [place] [address]    [facility] at [facility] Get Driving Directions  [phone] [address] [place], [place] [address]   [facility] at [facility] Directions [phone] [address], [address] [place], [place] [address]  Your MyChart E-visit questionnaire answers were reviewed by a board certified advanced clinical practitioner to complete your personal care plan based on your specific symptoms.  Thank you for using e-Visits.

## 2021-01-31 ENCOUNTER — Emergency Department (INDEPENDENT_AMBULATORY_CARE_PROVIDER_SITE_OTHER)
Admission: EM | Admit: 2021-01-31 | Discharge: 2021-01-31 | Disposition: A | Discharge status: Home / Self Care | Payer: 59 | Source: Home / Self Care

## 2021-01-31 ENCOUNTER — Encounter: Payer: Self-pay | Admitting: Emergency Medicine

## 2021-01-31 DIAGNOSIS — R682 Dry mouth, unspecified: Secondary | ICD-10-CM | POA: Diagnosis not present

## 2021-01-31 DIAGNOSIS — G8929 Other chronic pain: Secondary | ICD-10-CM | POA: Diagnosis not present

## 2021-01-31 DIAGNOSIS — U099 Post covid-19 condition, unspecified: Secondary | ICD-10-CM

## 2021-01-31 DIAGNOSIS — R519 Headache, unspecified: Secondary | ICD-10-CM

## 2021-01-31 MED ORDER — DEXAMETHASONE 6 MG PO TABS: 6.0000 mg | ORAL_TABLET | Freq: Every day | ORAL | 0 refills | Status: AC

## 2021-01-31 MED ORDER — AZITHROMYCIN 250 MG PO TABS: ORAL_TABLET | ORAL | 0 refills | Status: DC

## 2021-01-31 NOTE — Discharge Instructions (Addendum)
Start azithromycin this evening, start dexamethasone tomorrow morning. Increase hydration with water May try OTC chloraseptic or cepacol lozenges Purchase Biotene Follow up with PCP in one week

## 2021-01-31 NOTE — ED Triage Notes (Signed)
Sore throat worse today  COVID + on 01/17/21 Pt has a productive cough  Pt  c/o feeling SOB w/ DOE  Increased fatigue  Flu vaccine 2 weeks prior to being COVID positive

## 2021-01-31 NOTE — ED Provider Notes (Signed)
[facility]    CSN: [id number] Arrival date & time: 01/31/21  1754      History   Chief Complaint Chief Complaint  Patient presents with   Cough   Fatigue    HPI Sarah Mayer is a 50 y.o. female.   Patient presents for 2-week follow-up regarding a positive COVID test that she took at home on Thanksgiving evening.  She states this was her first time having COVID.  He has been fully vaccinated and boosted against COVID multiple times.  Her only chronic pulmonary issue is history of PE for which she takes daily Eliquis.  She states her #1 concern is sore throat, headache, shortness of breath.  She has tried several over-the-counter medications without much relief.  She is concerned as she also has several autoimmune conditions.  She admits most of her home medications she stopped abruptly several weeks ago.  She denies chest pain, fevers, sputum production.  Husband is also sick with similar symptoms.   Cough  Past Medical History:  Diagnosis Date   Chronic pain syndrome 05/25/2016   Gastroesophageal reflux disease 03/24/2016   History of DVT (deep vein thrombosis) 03/27/2016   History of pulmonary embolism 03/27/2016   IBS (irritable bowel syndrome)    Microscopic colitis    Migraine    Morbid obesity (HCC) 05/27/2016   Psoriatic arthritis [facility])     Patient Active Problem List   Diagnosis Date Noted   Cough, persistent 06/24/2020   DDD (degenerative disc disease), lumbar 01/23/2020   Osteoarthritis, hip, bilateral 01/23/2020   Intertrigo 08/11/2019   Melasma 08/11/2019   OAB (overactive bladder) 08/11/2019   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   Chronic nonintractable headache 02/07/2019   Bilateral leg weakness 01/23/2019   Skin infection 01/23/2019   Bilateral leg pain 11/14/2018   Dry mouth 11/14/2018   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   Jerking 09/12/2018   Episode of confusion 09/12/2018   Chronic left shoulder pain 03/15/2018   Chronic pain of left thumb  03/15/2018   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   Urinary frequency 02/14/2018   Weakness 02/11/2018   Recurrent falls 12/29/2017   IBS (irritable bowel syndrome) 12/03/2017   Myofascial pain 08/19/2017   Itching 08/19/2017   Yeast infection 08/19/2017   Easy bruising 07/26/2017   Psoriasis 07/06/2017   CFS (chronic fatigue syndrome) 07/06/2017   Edema of both feet 07/04/2017   B12 deficiency 07/04/2017   Acquired hypothyroidism 07/04/2017   Vitamin D deficiency 07/04/2017   Secondary hyperparathyroidism, non-renal (HCC) 07/04/2017   Word finding difficulty 03/15/2017   Intractable migraine without aura and with status migrainosus 03/15/2017   Numbness and tingling in left arm 01/17/2017   Tripping over things 01/12/2017   Left arm pain 01/12/2017   Left elbow pain 01/12/2017   Twitching 12/22/2016   Upper airway cough syndrome 12/22/2016   Bilateral hip pain 12/22/2016   Memory changes 11/23/2016   Major depressive disorder with current active episode 11/23/2016   OSA (obstructive sleep apnea) 11/23/2016   Collagenous colitis 10/18/2016   Barrett's esophagus without dysplasia 10/18/2016   Arthralgia 10/18/2016   Serum calcium elevated 08/30/2016   Diarrhea 08/30/2016   Morbidly obese (HCC) 05/27/2016   Chronic pain syndrome 05/25/2016   History of pulmonary embolus (PE) 03/27/2016   History of DVT (deep vein thrombosis) 03/27/2016   Chronic tension-type headache, not intractable 03/27/2016   Primary insomnia 03/27/2016   Bilateral lower extremity edema 03/27/2016   Psoriatic arthritis (HCC) 03/24/2016   Gastroesophageal reflux disease  03/24/2016   GAD (generalized anxiety disorder) 03/24/2016   Iron deficiency anemia 03/24/2016    Past Surgical History:  Procedure Laterality Date   COLONOSCOPY W/ BIOPSIES     ESOPHAGOGASTRODUODENOSCOPY      OB History   No obstetric history on file.      Home Medications    Prior to Admission medications   Medication Sig Start  Date End Date Taking? Authorizing Provider  azithromycin (ZITHROMAX Z-PAK) 250 MG tablet Take 2 tabs PO day one, followed by one tab PO day 2-5. 01/31/21  Yes Nori Winegar L, PA  dexamethasone (DECADRON) 6 MG tablet Take 1 tablet (6 mg total) by mouth daily for 5 days. 01/31/21 02/05/21 Yes Isabell Bonafede L, PA  omeprazole (PRILOSEC) 10 MG capsule Take 10 mg by mouth daily.   Yes [provider]  albuterol (VENTOLIN HFA) 108 (90 Base) MCG/ACT inhaler Inhale 2 puffs into the lungs every 6 (six) hours as needed for wheezing or shortness of breath. Patient not taking: Reported on 01/01/2021 02/26/20   Donella Stade, PA-C  AMBULATORY NON FORMULARY MEDICATION Tens unit to use for muscle atrophy, weakness, pain, muscle spasms Patient not taking: Reported on 01/01/2021 05/09/18   Donella Stade, PA-C  AMBULATORY NON FORMULARY MEDICATION Power wheelchair due to not being able to ambulate. 01/01/21   Breeback, Jade L, PA-C  amphetamine-dextroamphetamine (ADDERALL) 20 MG tablet Take 1 tablet (20 mg total) by mouth 2 (two) times daily. 01/01/21   Breeback, Jade L, PA-C  buPROPion (WELLBUTRIN XL) 300 MG 24 hr tablet Take 1 tablet (300 mg total) by mouth daily. 01/01/21   Breeback, Jade L, PA-C  busPIRone (BUSPAR) 15 MG tablet Take 1 tablet (15 mg total) by mouth 2 (two) times daily. Patient not taking: Reported on 01/31/2021 01/01/21   Donella Stade, PA-C  butalbital-acetaminophen-caffeine (FIORICET WITH CODEINE) 50-325-40-30 MG capsule TAKE 1 CAPSULE BY MOUTH EVERY 4 HOURS AS NEEDED FOR HEADACHE 01/01/21   Breeback, Jade L, PA-C  cholecalciferol (VITAMIN D3) 25 MCG (1000 UT) tablet Take 1,000 Units by mouth daily. Patient not taking: Reported on 01/01/2021    [provider]  cyclobenzaprine (FLEXERIL) 10 MG tablet TAKE 1 TABLET(10 MG) BY MOUTH THREE TIMES DAILY AS NEEDED FOR MUSCLE SPASMS 01/01/21   Breeback, Jade L, PA-C  dexlansoprazole (DEXILANT) 60 MG capsule Take 1 capsule (60 mg total) by  mouth daily. Patient not taking: Reported on 01/31/2021 01/01/21   Iran Planas L, PA-C  diclofenac Sodium (VOLTAREN) 1 % GEL APPLY 4 GRAMS EXTERNALLY TO THE AFFECTED AREA FOUR TIMES DAILY AS NEEDED 01/01/21   Breeback, Jade L, PA-C  divalproex (DEPAKOTE) 250 MG DR tablet Take 1 tablet (250 mg total) by mouth 2 (two) times daily. Patient not taking: Reported on 01/31/2021 01/01/21   Breeback, Jade L, PA-C  ELIQUIS 5 MG TABS tablet TAKE 1 TABLET(5 MG) BY MOUTH TWICE DAILY 07/29/20   Breeback, Jade L, PA-C  famotidine (PEPCID) 40 MG tablet TAKE 1 TABLET(40 MG) BY MOUTH TWICE DAILY Patient not taking: Reported on 01/31/2021 01/01/21   Iran Planas L, PA-C  fluticasone (FLONASE) 50 MCG/ACT nasal spray One spray in each nostril twice a day, use left hand for right nostril, and right hand for left nostril. 01/01/21   Breeback, Jade L, PA-C  gabapentin (NEURONTIN) 600 MG tablet Take 1 tablet (600 mg total) by mouth 3 (three) times daily. 01/01/21   Breeback, Royetta Car, PA-C  HYDROcodone-acetaminophen (NORCO) 10-325 MG tablet Take 1 tablet  by mouth every 12 (twelve) hours. 01/14/21 02/13/21  Donella Stade, PA-C  hydrOXYzine (ATARAX/VISTARIL) 50 MG tablet Take 1 tablet (50 mg total) by mouth at bedtime as needed for itching (may repeat once as needed). 01/01/21   Breeback, Jade L, PA-C  levocetirizine (XYZAL) 5 MG tablet TAKE 1 TABLET(5 MG) BY MOUTH EVERY EVENING 01/01/21   Breeback, Jade L, PA-C  MULTIPLE VITAMIN PO Take by mouth daily.    [provider]  ondansetron (ZOFRAN-ODT) 8 MG disintegrating tablet Take 1 tablet (8 mg total) by mouth every 8 (eight) hours as needed for nausea or vomiting. Patient not taking: Reported on 01/31/2021 01/01/21   Donella Stade, PA-C  oxybutynin (DITROPAN-XL) 10 MG 24 hr tablet TAKE 1 TABLET(10 MG) BY MOUTH AT BEDTIME Patient not taking: Reported on 01/31/2021 01/01/21   Donella Stade, PA-C  silver sulfADIAZINE (SILVADENE) 1 % cream Apply 1 application topically  daily. Apply to deep burns Patient not taking: Reported on 01/31/2021 01/01/21   Iran Planas L, PA-C  zolpidem (AMBIEN) 5 MG tablet Take 1 tablet (5 mg total) by mouth at bedtime. 01/01/21   Donella Stade, PA-C    Family History Family History  Problem Relation Age of Onset   Hypertension Mother    Hyperlipidemia Mother    Diabetes Mother    Dementia Father     Social History Social History   Tobacco Use   Smoking status: Never   Smokeless tobacco: Never  Substance Use Topics   Alcohol use: No   Drug use: No     Allergies   Imitrex [sumatriptan], Methotrexate derivatives, Moxifloxacin hcl in nacl, Nsaids, Topamax [topiramate], Remeron [mirtazapine], Clarithromycin, Penicillins, Sulfa antibiotics, and Sumatriptan succinate   Review of Systems Review of Systems  Respiratory:  Positive for cough.   AS PER HPI  Physical Exam Triage Vital Signs ED Triage Vitals  Enc Vitals Group     BP 01/31/21 1809 104/80     Pulse Rate 01/31/21 1809 96     Resp 01/31/21 1809 18     Temp 01/31/21 1809 98.7 F (37.1 C)     Temp Source 01/31/21 1809 Oral     SpO2 01/31/21 1809 98 %     Weight 01/31/21 1810 (!) 317 lb (143.8 kg)     Height 01/31/21 1810 5\' 4"  (1.626 m)     Head Circumference --      Peak Flow --      Pain Score 01/31/21 1810 4     Pain Loc --      Pain Edu? --      Excl. in [place]? --    No data found.  Updated Vital Signs BP 104/80 (BP Location: Right Arm)   Pulse 96   Temp 98.7 F (37.1 C) (Oral)   Resp 18   Ht 5\' 4"  (1.626 m)   Wt (!) 317 lb (143.8 kg)   SpO2 98%   BMI 54.41 kg/m   Visual Acuity Right Eye Distance:   Left Eye Distance:   Bilateral Distance:    Right Eye Near:   Left Eye Near:    Bilateral Near:     Physical Exam Vitals and nursing note reviewed.  Constitutional:      General: She is not in acute distress.    Appearance: She is obese. She is not ill-appearing or toxic-appearing.     Comments: In wheelchair  HENT:      Head: Normocephalic and atraumatic.  Right Ear: Tympanic membrane, ear canal and external ear normal.     Left Ear: Tympanic membrane, ear canal and external ear normal.     Nose: Nose normal. No congestion or rhinorrhea.     Mouth/Throat:     Mouth: Mucous membranes are dry.     Pharynx: Oropharynx is clear. No oropharyngeal exudate or posterior oropharyngeal erythema.  Eyes:     General:        Right eye: No discharge.        Left eye: No discharge.     Extraocular Movements: Extraocular movements intact.     Conjunctiva/sclera: Conjunctivae normal.     Pupils: Pupils are equal, round, and reactive to light.  Cardiovascular:     Rate and Rhythm: Normal rate and regular rhythm.     Pulses: Normal pulses.     Heart sounds: Normal heart sounds. No murmur heard. Pulmonary:     Effort: Pulmonary effort is normal. No respiratory distress.     Breath sounds: Normal breath sounds. No wheezing or rhonchi.  Musculoskeletal:     Cervical back: Normal range of motion and neck supple.  Lymphadenopathy:     Cervical: No cervical adenopathy.  Skin:    General: Skin is warm.  Neurological:     Mental Status: She is alert. Mental status is at baseline.     UC Treatments / Results  Labs (all labs ordered are listed, but only abnormal results are displayed) Labs Reviewed - No data to display  EKG   Radiology No results found.  Procedures Procedures (including critical care time)  Medications Ordered in UC Medications - No data to display  Initial Impression / Assessment and Plan / UC Course  I have reviewed the triage vital signs and the nursing notes.  Pertinent labs & imaging results that were available during my care of the patient were reviewed by me and considered in my medical decision making (see chart for details).     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  Treat with combo azithromycin and dexamethasone Dry mouth -chronic per patient.  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 Increase hydration with water May try OTC chloraseptic or cepacol lozenges Purchase Biotene Follow up with PCP in one week   ED Prescriptions     Medication Sig Dispense Auth. Provider   azithromycin (ZITHROMAX Z-PAK) 250 MG tablet Take 2 tabs PO day one, followed by one tab PO day 2-5. 6 tablet Talan Gildner L, PA   dexamethasone (DECADRON) 6 MG tablet Take 1 tablet (6 mg total) by mouth daily for 5 days. 5 tablet Graysyn Bache L, [place]      PDMP not reviewed this encounter.   Chaney Malling, [place] 01/31/21 2019

## 2021-02-01 ENCOUNTER — Other Ambulatory Visit: Payer: Self-pay | Admitting: Physician Assistant

## 2021-02-01 DIAGNOSIS — F329 Major depressive disorder, single episode, unspecified: Secondary | ICD-10-CM

## 2021-02-10 ENCOUNTER — Other Ambulatory Visit: Payer: Self-pay

## 2021-02-10 ENCOUNTER — Ambulatory Visit (INDEPENDENT_AMBULATORY_CARE_PROVIDER_SITE_OTHER): Payer: 59 | Admitting: Physician Assistant

## 2021-02-10 ENCOUNTER — Encounter: Payer: Self-pay | Admitting: Physician Assistant

## 2021-02-10 VITALS — BP 98/69 | HR 87 | Temp 97.7°F

## 2021-02-10 DIAGNOSIS — M7918 Myalgia, other site: Secondary | ICD-10-CM | POA: Diagnosis not present

## 2021-02-10 DIAGNOSIS — L299 Pruritus, unspecified: Secondary | ICD-10-CM

## 2021-02-10 DIAGNOSIS — N3281 Overactive bladder: Secondary | ICD-10-CM

## 2021-02-10 DIAGNOSIS — J40 Bronchitis, not specified as acute or chronic: Secondary | ICD-10-CM

## 2021-02-10 DIAGNOSIS — U099 Post covid-19 condition, unspecified: Secondary | ICD-10-CM | POA: Diagnosis not present

## 2021-02-10 DIAGNOSIS — G894 Chronic pain syndrome: Secondary | ICD-10-CM | POA: Diagnosis not present

## 2021-02-10 DIAGNOSIS — G43011 Migraine without aura, intractable, with status migrainosus: Secondary | ICD-10-CM

## 2021-02-10 DIAGNOSIS — J329 Chronic sinusitis, unspecified: Secondary | ICD-10-CM

## 2021-02-10 MED ORDER — GABAPENTIN 600 MG PO TABS: 600.0000 mg | ORAL_TABLET | Freq: Three times a day (TID) | ORAL | 1 refills | Status: DC

## 2021-02-10 MED ORDER — LEVOFLOXACIN 500 MG PO TABS: 500.0000 mg | ORAL_TABLET | Freq: Every day | ORAL | 0 refills | Status: AC

## 2021-02-10 MED ORDER — BUTALBITAL-APAP-CAFF-COD 50-325-40-30 MG PO CAPS: ORAL_CAPSULE | ORAL | 0 refills | Status: DC

## 2021-02-10 MED ORDER — KETOROLAC TROMETHAMINE 60 MG/2ML IM SOLN
60.0000 mg | Freq: Once | INTRAMUSCULAR | Status: AC
  Administered 2021-02-10: 16:00:00 60 mg via INTRAMUSCULAR

## 2021-02-10 MED ORDER — OXYBUTYNIN CHLORIDE ER 10 MG PO TB24: ORAL_TABLET | ORAL | 3 refills | Status: DC

## 2021-02-10 MED ORDER — CYANOCOBALAMIN 1000 MCG/ML IJ SOLN
1000.0000 ug | Freq: Once | INTRAMUSCULAR | Status: AC
  Administered 2021-02-10: 16:00:00 1000 ug via INTRAMUSCULAR

## 2021-02-10 NOTE — Progress Notes (Signed)
Subjective:    Patient ID: Sarah Mayer, female    DOB: 25-Jun-1970, 50 y.o.   MRN: [id number]  HPI Pt is a 50 yo obese female with extensive PMHx who presents to the clinic with sinus pressure, cough, drainage for the last 8 weeks. She has covid long haulers with chronic headache/fatigue/brain fog. She denies any fever, chills. She is coughing more and has tons of sinus drainage and pressure. Hx of chronic sinutsitis. Been on OtC medications since middle November.   She does need OAB medication refills.   .. Active Ambulatory Problems    Diagnosis Date Noted   Psoriatic arthritis ([place]) 03/24/2016   Gastroesophageal reflux disease 03/24/2016   GAD (generalized anxiety disorder) 03/24/2016   Iron deficiency anemia 03/24/2016   History of pulmonary embolus (PE) 03/27/2016   History of DVT (deep vein thrombosis) 03/27/2016   Chronic tension-type headache, not intractable 03/27/2016   Primary insomnia 03/27/2016   Bilateral lower extremity edema 03/27/2016   Chronic pain syndrome 05/25/2016   Morbidly obese (HCC) 05/27/2016   Serum calcium elevated 08/30/2016   Diarrhea 08/30/2016   Collagenous colitis 10/18/2016   Barrett's esophagus without dysplasia 10/18/2016   Arthralgia 10/18/2016   Memory changes 11/23/2016   Major depressive disorder with current active episode 11/23/2016   OSA (obstructive sleep apnea) 11/23/2016   Twitching 12/22/2016   Upper airway cough syndrome 12/22/2016   Bilateral hip pain 12/22/2016   Tripping over things 01/12/2017   Left arm pain 01/12/2017   Left elbow pain 01/12/2017   Numbness and tingling in left arm 01/17/2017   Word finding difficulty 03/15/2017   Intractable migraine without aura and with status migrainosus 03/15/2017   Edema of both feet 07/04/2017   B12 deficiency 07/04/2017   Acquired hypothyroidism 07/04/2017   Vitamin D deficiency 07/04/2017   Secondary hyperparathyroidism, non-renal ([place]) 07/04/2017   Psoriasis 07/06/2017    CFS (chronic fatigue syndrome) 07/06/2017   Easy bruising 07/26/2017   Myofascial pain 08/19/2017   Itching 08/19/2017   Yeast infection 08/19/2017   IBS (irritable bowel syndrome) 12/03/2017   Recurrent falls 12/29/2017   Weakness 02/11/2018   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   Urinary frequency 02/14/2018   Chronic left shoulder pain 03/15/2018   Chronic pain of left thumb 03/15/2018   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   Jerking 09/12/2018   Episode of confusion 09/12/2018   Bilateral leg pain 11/14/2018   Dry mouth 11/14/2018   Bilateral leg weakness 01/23/2019   Skin infection 01/23/2019   Chronic nonintractable headache 02/07/2019   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   Intertrigo 08/11/2019   Melasma 08/11/2019   OAB (overactive bladder) 08/11/2019   DDD (degenerative disc disease), lumbar 01/23/2020   Osteoarthritis, hip, bilateral 01/23/2020   Cough, persistent 06/24/2020   COVID-19 long hauler 02/18/2021   Resolved Ambulatory Problems    Diagnosis Date Noted   Hyperparathyroidism, primary ([place]) 09/18/2016   Rash and nonspecific skin eruption 10/18/2016   Acute bronchitis 02/07/2018   Elevated serum creatinine 11/14/2018   Pneumonia of left upper lobe due to Chlamydia species 08/11/2019   Acute cystitis without hematuria 07/10/2020   Past Medical History:  Diagnosis Date   History of pulmonary embolism 03/27/2016   Microscopic colitis    Migraine    Morbid obesity ([place]) 05/27/2016     Review of Systems See HPI.     Objective:   Physical Exam Vitals reviewed.  Constitutional:      Appearance: Normal appearance. She is obese.  HENT:  Head: Normocephalic.     Right Ear: Tympanic membrane, ear canal and external ear normal. There is no impacted cerumen.     Left Ear: Tympanic membrane, ear canal and external ear normal. There is no impacted cerumen.     Nose: Nose normal.     Mouth/Throat:     Mouth: Mucous membranes are moist.     Pharynx: No oropharyngeal  exudate or posterior oropharyngeal erythema.  Eyes:     General:        Right eye: No discharge.        Left eye: No discharge.     Extraocular Movements: Extraocular movements intact.     Conjunctiva/sclera: Conjunctivae normal.     Pupils: Pupils are equal, round, and reactive to light.  Neck:     Vascular: No carotid bruit.  Cardiovascular:     Rate and Rhythm: Normal rate and regular rhythm.     Pulses: Normal pulses.  Pulmonary:     Effort: Pulmonary effort is normal.     Breath sounds: Normal breath sounds.  Lymphadenopathy:     Cervical: No cervical adenopathy.  Neurological:     General: No focal deficit present.     Mental Status: She is alert.  Psychiatric:        Mood and Affect: Mood normal.          Assessment & Plan:   Marland KitchenMarland KitchenSamar was seen today for nasal congestion and cough.  Diagnoses and all orders for this visit:  COVID-19 long hauler -     cyanocobalamin ((VITAMIN B-12)) injection 1,000 mcg  Myofascial pain -     gabapentin (NEURONTIN) 600 MG tablet; Take 1 tablet (600 mg total) by mouth 3 (three) times daily. -     cyanocobalamin ((VITAMIN B-12)) injection 1,000 mcg -     ketorolac (TORADOL) injection 60 mg  Chronic pain syndrome -     gabapentin (NEURONTIN) 600 MG tablet; Take 1 tablet (600 mg total) by mouth 3 (three) times daily.  Intractable migraine without aura and with status migrainosus -     butalbital-acetaminophen-caffeine (FIORICET WITH CODEINE) 50-325-40-30 MG capsule; TAKE 1 CAPSULE BY MOUTH EVERY 4 HOURS AS NEEDED FOR HEADACHE  Itching  OAB (overactive bladder) -     oxybutynin (DITROPAN-XL) 10 MG 24 hr tablet; TAKE 1 TABLET(10 MG) BY MOUTH AT BEDTIME  Sinobronchitis -     levofloxacin (LEVAQUIN) 500 MG tablet; Take 1 tablet (500 mg total) by mouth daily for 7 days.   Treated with levaquin.  Refilled oxybutynin.  Increased gabapentin for pain. Watch out for missed dosages.  Toradol and b12 shote given today.

## 2021-02-18 DIAGNOSIS — U099 Post covid-19 condition, unspecified: Secondary | ICD-10-CM | POA: Insufficient documentation

## 2021-03-07 ENCOUNTER — Other Ambulatory Visit: Payer: Self-pay | Admitting: Neurology

## 2021-03-07 DIAGNOSIS — G894 Chronic pain syndrome: Secondary | ICD-10-CM

## 2021-03-07 DIAGNOSIS — L4050 Arthropathic psoriasis, unspecified: Secondary | ICD-10-CM

## 2021-03-07 MED ORDER — HYDROCODONE-ACETAMINOPHEN 10-325 MG PO TABS: 1.0000 | ORAL_TABLET | Freq: Two times a day (BID) | ORAL | 0 refills | Status: DC

## 2021-03-07 NOTE — Progress Notes (Signed)
Sent refill. Appt within the last 3 months. UTD pain contract. Last fill 11/22.

## 2021-03-07 NOTE — Progress Notes (Signed)
Hydrocodone last written 01/14/2021 #30 no refills On pain contract Last appt 02/10/2021

## 2021-03-11 ENCOUNTER — Encounter: Payer: Self-pay | Admitting: Physician Assistant

## 2021-03-12 NOTE — Telephone Encounter (Signed)
Called Pharmacy to follow up on Norco prescription, [facility] states that pt need a Prior Authorization.

## 2021-03-17 NOTE — Telephone Encounter (Signed)
Printed off insurance and scanned into patient's chart. AM

## 2021-03-24 ENCOUNTER — Other Ambulatory Visit: Payer: Self-pay | Admitting: Physician Assistant

## 2021-03-24 DIAGNOSIS — J329 Chronic sinusitis, unspecified: Secondary | ICD-10-CM

## 2021-03-31 ENCOUNTER — Telehealth: Payer: Self-pay | Admitting: Neurology

## 2021-03-31 MED ORDER — DICYCLOMINE HCL 20 MG PO TABS: 10.0000 mg | ORAL_TABLET | Freq: Three times a day (TID) | ORAL | 0 refills | Status: DC

## 2021-03-31 NOTE — Telephone Encounter (Signed)
Received message from pharmacy at [facility] 10 mg on back order, asked to change to 20 mg and split tablet. Ok per [employer]. RX sent.

## 2021-04-01 ENCOUNTER — Ambulatory Visit (INDEPENDENT_AMBULATORY_CARE_PROVIDER_SITE_OTHER): Payer: 59 | Admitting: Physician Assistant

## 2021-04-01 ENCOUNTER — Other Ambulatory Visit: Payer: Self-pay

## 2021-04-01 ENCOUNTER — Encounter: Payer: Self-pay | Admitting: Physician Assistant

## 2021-04-01 VITALS — BP 124/82 | HR 85

## 2021-04-01 DIAGNOSIS — K580 Irritable bowel syndrome with diarrhea: Secondary | ICD-10-CM | POA: Diagnosis not present

## 2021-04-01 DIAGNOSIS — Z1211 Encounter for screening for malignant neoplasm of colon: Secondary | ICD-10-CM | POA: Diagnosis not present

## 2021-04-01 DIAGNOSIS — G9332 Myalgic encephalomyelitis/chronic fatigue syndrome: Secondary | ICD-10-CM | POA: Diagnosis not present

## 2021-04-01 DIAGNOSIS — K2100 Gastro-esophageal reflux disease with esophagitis, without bleeding: Secondary | ICD-10-CM

## 2021-04-01 DIAGNOSIS — F329 Major depressive disorder, single episode, unspecified: Secondary | ICD-10-CM

## 2021-04-01 DIAGNOSIS — G43011 Migraine without aura, intractable, with status migrainosus: Secondary | ICD-10-CM | POA: Diagnosis not present

## 2021-04-01 DIAGNOSIS — G454 Transient global amnesia: Secondary | ICD-10-CM

## 2021-04-01 DIAGNOSIS — F410 Panic disorder [episodic paroxysmal anxiety] without agoraphobia: Secondary | ICD-10-CM

## 2021-04-01 DIAGNOSIS — Z1231 Encounter for screening mammogram for malignant neoplasm of breast: Secondary | ICD-10-CM | POA: Diagnosis not present

## 2021-04-01 DIAGNOSIS — G894 Chronic pain syndrome: Secondary | ICD-10-CM

## 2021-04-01 DIAGNOSIS — L4050 Arthropathic psoriasis, unspecified: Secondary | ICD-10-CM | POA: Diagnosis not present

## 2021-04-01 DIAGNOSIS — R69 Illness, unspecified: Secondary | ICD-10-CM | POA: Diagnosis not present

## 2021-04-01 MED ORDER — ZOLPIDEM TARTRATE 10 MG PO TABS: 10.0000 mg | ORAL_TABLET | Freq: Every evening | ORAL | 1 refills | Status: DC | PRN

## 2021-04-01 MED ORDER — HYOSCYAMINE SULFATE ER 0.375 MG PO TB12: 0.3750 mg | ORAL_TABLET | Freq: Two times a day (BID) | ORAL | 0 refills | Status: DC | PRN

## 2021-04-01 MED ORDER — NURTEC 75 MG PO TBDP: 1.0000 | ORAL_TABLET | ORAL | 5 refills | Status: DC | PRN

## 2021-04-01 MED ORDER — VORTIOXETINE HBR 5 MG PO TABS: 5.0000 mg | ORAL_TABLET | Freq: Every day | ORAL | 0 refills | Status: DC

## 2021-04-01 MED ORDER — AMPHETAMINE-DEXTROAMPHETAMINE 20 MG PO TABS: 20.0000 mg | ORAL_TABLET | Freq: Two times a day (BID) | ORAL | 0 refills | Status: DC

## 2021-04-01 MED ORDER — HYDROCODONE-ACETAMINOPHEN 10-325 MG PO TABS: 1.0000 | ORAL_TABLET | Freq: Two times a day (BID) | ORAL | 0 refills | Status: DC

## 2021-04-01 MED ORDER — RABEPRAZOLE SODIUM 20 MG PO TBEC: 20.0000 mg | DELAYED_RELEASE_TABLET | Freq: Every day | ORAL | 3 refills | Status: DC

## 2021-04-01 NOTE — Progress Notes (Signed)
Subjective:    Patient ID: Sarah Mayer, female    DOB: 30-Jul-1970, 51 y.o.   MRN: [id number]  HPI Pt is a 51 yo obese female who presents to the clinic for medication refills and follow ups.   Needs a new rheumatologist. Last simponi infusion was September. Current RA pain not controlled.   MDD and anxiety is ok. She wants to get back on her medications. She is having a lot of brain fog. She is having trouble finding adderall which is not helping. She admits to a day where she lost 6hrs and has no clue what she did that day.  .. Active Ambulatory Problems    Diagnosis Date Noted   Psoriatic arthritis ([place]) 03/24/2016   Gastroesophageal reflux disease 03/24/2016   GAD (generalized anxiety disorder) 03/24/2016   Iron deficiency anemia 03/24/2016   History of pulmonary embolus (PE) 03/27/2016   History of DVT (deep vein thrombosis) 03/27/2016   Chronic tension-type headache, not intractable 03/27/2016   Primary insomnia 03/27/2016   Bilateral lower extremity edema 03/27/2016   Chronic pain syndrome 05/25/2016   Morbidly obese (HCC) 05/27/2016   Serum calcium elevated 08/30/2016   Diarrhea 08/30/2016   Collagenous colitis 10/18/2016   Barrett's esophagus without dysplasia 10/18/2016   Arthralgia 10/18/2016   Memory changes 11/23/2016   Major depressive disorder with current active episode 11/23/2016   OSA (obstructive sleep apnea) 11/23/2016   Twitching 12/22/2016   Upper airway cough syndrome 12/22/2016   Bilateral hip pain 12/22/2016   Tripping over things 01/12/2017   Left arm pain 01/12/2017   Left elbow pain 01/12/2017   Numbness and tingling in left arm 01/17/2017   Word finding difficulty 03/15/2017   Intractable migraine without aura and with status migrainosus 03/15/2017   Edema of both feet 07/04/2017   B12 deficiency 07/04/2017   Acquired hypothyroidism 07/04/2017   Vitamin D deficiency 07/04/2017   Secondary hyperparathyroidism, non-renal ([place]) 07/04/2017    Psoriasis 07/06/2017   CFS (chronic fatigue syndrome) 07/06/2017   Easy bruising 07/26/2017   Myofascial pain 08/19/2017   Itching 08/19/2017   Yeast infection 08/19/2017   IBS (irritable bowel syndrome) 12/03/2017   Recurrent falls 12/29/2017   Weakness 02/11/2018   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   Urinary frequency 02/14/2018   Chronic left shoulder pain 03/15/2018   Chronic pain of left thumb 03/15/2018   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   Jerking 09/12/2018   Episode of confusion 09/12/2018   Bilateral leg pain 11/14/2018   Dry mouth 11/14/2018   Bilateral leg weakness 01/23/2019   Skin infection 01/23/2019   Chronic nonintractable headache 02/07/2019   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   Intertrigo 08/11/2019   Melasma 08/11/2019   OAB (overactive bladder) 08/11/2019   DDD (degenerative disc disease), lumbar 01/23/2020   Osteoarthritis, hip, bilateral 01/23/2020   Cough, persistent 06/24/2020   COVID-19 long hauler 02/18/2021   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     Objective:   Physical  Exam Vitals reviewed.  Constitutional:      Appearance: Normal appearance. She is obese.     Comments: In wheelchair  HENT:     Head: Normocephalic.  Cardiovascular:     Rate and Rhythm: Normal rate and regular rhythm.     Pulses: Normal pulses.     Heart sounds: Normal heart sounds.  Pulmonary:     Effort: Pulmonary effort is normal.     Breath sounds: Normal  breath sounds.  Musculoskeletal:     Comments: Swollen joints in fingers and hands.   Neurological:     General: No focal deficit present.     Mental Status: She is alert and oriented to person, place, and time.  Psychiatric:        Mood and Affect: Mood normal.   .. Depression screen [facility] 2/9 04/01/2021 01/01/2021 01/22/2020 08/07/2019 02/03/2019  Decreased Interest 1 1 1 1 1   Down, Depressed, Hopeless 3 3 2 3 2   PHQ - 2 Score 4 4 3 4 3   Altered sleeping 3 3 1 3 3   Tired, decreased energy 3 3 3 3 3   Change in appetite 1 1 1 2 1   Feeling bad or failure about yourself  1 1 1  0 1  Trouble concentrating 3 3 3 2 3   Moving slowly or fidgety/restless 0 1 1 1  0  Suicidal thoughts 0 0 0 0 0  PHQ-9 Score 15 16 13 15 14   Difficult doing work/chores Somewhat difficult Extremely dIfficult Extremely dIfficult Extremely dIfficult Extremely dIfficult  Some recent data might be hidden   .[name] GAD 7 : Generalized Anxiety Score 04/01/2021 01/01/2021 01/22/2020 08/07/2019  Nervous, Anxious, on Edge 3 3 3 3   Control/stop worrying 3 3 3 3   Worry too much - different things 3 3 3 3   Trouble relaxing 3 3 3 3   Restless 2 1 1 2   Easily annoyed or irritable 1 2 2 3   Afraid - awful might happen 1 2 1 2   Total GAD 7 Score 16 17 16 19   Anxiety Difficulty Somewhat difficult Extremely difficult Somewhat difficult Extremely difficult            Assessment & Plan:  Marland KitchenMarland KitchenEleonore was seen today for follow-up.  Diagnoses and all orders for this visit:  Psoriatic arthritis ([place]) -     HYDROcodone-acetaminophen (NORCO) 10-325 MG tablet; Take 1 tablet by mouth every 12 (twelve) hours. -     DME Wheelchair manual  Encounter for screening mammogram for malignant neoplasm of breast -     MM 3D SCREEN BREAST BILATERAL  Colon cancer screening -     Ambulatory referral to Gastroenterology  Chronic pain syndrome -     HYDROcodone-acetaminophen (NORCO) 10-325 MG tablet; Take 1 tablet by mouth every 12 (twelve)  hours. -     vortioxetine HBr (TRINTELLIX) 5 MG TABS tablet; Take 1 tablet (5 mg total) by mouth daily.  CFS (chronic fatigue syndrome) -     amphetamine-dextroamphetamine (ADDERALL) 20 MG tablet; Take 1 tablet (20 mg total) by mouth 2 (two) times daily.  Panic attacks  Gastroesophageal reflux disease with esophagitis, unspecified whether hemorrhage -     RABEprazole (ACIPHEX) 20 MG tablet; Take 1 tablet (20 mg total) by mouth daily.  Intractable migraine without aura and with status migrainosus -     Rimegepant Sulfate (NURTEC) 75 MG TBDP; Take 1 tablet by mouth as needed.  Irritable bowel syndrome with diarrhea -     hyoscyamine (LEVBID) 0.375 MG  12 hr tablet; Take 1 tablet (0.375 mg total) by mouth every 12 (twelve) hours as needed.  Major depressive disorder with current active episode, unspecified depression episode severity, unspecified whether recurrent -     vortioxetine HBr (TRINTELLIX) 5 MG TABS tablet; Take 1 tablet (5 mg total) by mouth daily.  Transient global amnesia  Other orders -     Discontinue: zolpidem (AMBIEN) 10 MG tablet; Take 1 tablet (10 mg total) by mouth at bedtime as needed for sleep.   Discussed transient Global Amnesia. Follow up if happens again.   New referral to rheumatology.   Medications refilled.

## 2021-04-01 NOTE — Patient Instructions (Signed)
Transient Global Amnesia Transient global amnesia causes a sudden and temporary (transient) loss of memory (amnesia). You may recall memories from your distant past and people you know well. However, you may not recall things that happened more recently in the past days, months, or even year. A transient global amnesia episode does not last longer than 24 hours. Transient global amnesia does not affect your other brain functions. Your memory usually returns to normal after an episode is over. One episode of transient global amnesia does not make you more likely to have a stroke, a relapse, or other complications. What are the causes? The cause of this condition is not known. Certain activities have been reported to trigger transient global amnesia. These activities include: Swimming in very cold or hot water. Sexual intercourse. Emotional distress, such as receiving bad news or having a lot of stress at once. Strenuous exercise or activity. What increases the risk? You are more likely to develop this condition if: You are 45-53 years old. You have a history of migraine headaches. What are the signs or symptoms? The main symptoms of this condition include: Being unable to remember recent events. Asking repetitive questions about a situation and surroundings and not recalling the answers to these questions. Other symptoms include: Restlessness and nervousness. Confusion. Headaches. Dizziness. Nausea. How is this diagnosed? This condition may be diagnosed based on: Your symptoms. A physical exam. A test to check your mental abilities (cognitive evaluation). Imaging studies to check brain function. These may include: Electroencephalogram (EEG). This test checks the brain's electrical activity. CT scan. MRI. How is this treated? There is no treatment for this condition. An episode typically goes away on its own after a few hours. You may receive medicines to treat other conditions, such  as a migraine. Follow these instructions at home: Take over-the-counter and prescription medicines only as told by your health care provider. Avoid taking medicines that can affect thinking, such as pain or sleeping medicines. Learn what activities may trigger an episode. Avoid these activities as told by your health care provider. Find ways to manage stress, such as meditation or yoga. Keep all follow-up visits as told by your health care provider. This is important. Contact a health care provider if you: Have a migraine that does not go away. Experience transient global amnesia repeatedly. Get help right away if you: Have a seizure. Summary Transient global amnesia causes a sudden and temporary (transient) loss of memory (amnesia). There is no treatment for this condition. An episode typically goes away on its own after a few hours. You may receive medicines to treat other conditions, such as a migraine. Transient global amnesia does not affect your other brain functions. Your memory usually returns to normal after an episode is over. This information is not intended to replace advice given to you by your health care provider. Make sure you discuss any questions you have with your health care provider. Document Revised: 04/22/2020 Document Reviewed: 04/22/2020 Elsevier Patient Education  2022 [employer].

## 2021-04-02 ENCOUNTER — Encounter: Payer: Self-pay | Admitting: Physician Assistant

## 2021-04-02 DIAGNOSIS — G894 Chronic pain syndrome: Secondary | ICD-10-CM

## 2021-04-02 DIAGNOSIS — L4050 Arthropathic psoriasis, unspecified: Secondary | ICD-10-CM

## 2021-04-04 ENCOUNTER — Ambulatory Visit: Payer: Medicaid Other | Admitting: Physician Assistant

## 2021-04-04 NOTE — Telephone Encounter (Signed)
PDMP reviewed during this encounter.  

## 2021-04-06 ENCOUNTER — Other Ambulatory Visit: Payer: Self-pay | Admitting: Physician Assistant

## 2021-04-06 DIAGNOSIS — Z86711 Personal history of pulmonary embolism: Secondary | ICD-10-CM

## 2021-04-07 MED ORDER — ZOLPIDEM TARTRATE 10 MG PO TABS: 10.0000 mg | ORAL_TABLET | Freq: Every evening | ORAL | 0 refills | Status: DC | PRN

## 2021-04-10 ENCOUNTER — Encounter: Payer: Self-pay | Admitting: Physician Assistant

## 2021-04-10 ENCOUNTER — Telehealth: Payer: Self-pay

## 2021-04-10 NOTE — Telephone Encounter (Addendum)
Initiated Prior authorization [id number] 10-325MG  tablets Via: Covermymeds Case/Key:[id number] - Rx #: [id number] Status: approved as of 04/10/21 Reason: Notified Pt via: Mychart   Initiated Prior authorization for: Trintellix (formerly Brintellix) 5MG  tablets Via: Covermymeds Case/Key: Status: approved  as of 04/10/21 Reason: Notified Pt via: Mychart

## 2021-04-11 MED ORDER — QUETIAPINE FUMARATE 25 MG PO TABS: 25.0000 mg | ORAL_TABLET | Freq: Every day | ORAL | 0 refills | Status: DC

## 2021-04-11 NOTE — Addendum Note (Signed)
Addended by: Jomarie Longs on: 04/11/2021 05:09 PM   Modules accepted: Orders

## 2021-04-15 MED ORDER — PAROXETINE HCL 10 MG PO TABS
10 mg | ORAL_TABLET | Freq: Every day | ORAL | 1 refills | 30.00000 days
Start: 2021-04-15 — End: ?

## 2021-04-16 MED ORDER — PAROXETINE HCL 10 MG PO TABS
10 mg | ORAL_TABLET | Freq: Every day | ORAL | 1 refills | 30.00000 days | Status: AC
Start: 2021-04-16 — End: ?

## 2021-04-21 DIAGNOSIS — G454 Transient global amnesia: Secondary | ICD-10-CM | POA: Insufficient documentation

## 2021-04-25 MED ORDER — BUTALBITAL-APAP-CAFFEINE 50-325-40 MG PO TABS: 1.0000 | ORAL_TABLET | Freq: Four times a day (QID) | ORAL | 0 refills | Status: DC | PRN

## 2021-04-25 NOTE — Addendum Note (Signed)
Addended by: Donella Stade on: 04/25/2021 12:51 PM ? ? Modules accepted: Orders ? ?

## 2021-05-01 ENCOUNTER — Encounter: Payer: Self-pay | Admitting: Physician Assistant

## 2021-05-02 NOTE — Telephone Encounter (Signed)
They both need to come in.  If they are having breakthrough sxs on PPI then may need further workup ?

## 2021-05-04 ENCOUNTER — Other Ambulatory Visit: Payer: Self-pay | Admitting: Physician Assistant

## 2021-05-04 DIAGNOSIS — G894 Chronic pain syndrome: Secondary | ICD-10-CM

## 2021-05-04 DIAGNOSIS — M7918 Myalgia, other site: Secondary | ICD-10-CM

## 2021-05-07 ENCOUNTER — Encounter: Payer: Self-pay | Admitting: Physician Assistant

## 2021-05-07 ENCOUNTER — Telehealth: Payer: Self-pay

## 2021-05-07 DIAGNOSIS — K2100 Gastro-esophageal reflux disease with esophagitis, without bleeding: Secondary | ICD-10-CM

## 2021-05-07 MED ORDER — RABEPRAZOLE SODIUM 20 MG PO TBEC: 20.0000 mg | DELAYED_RELEASE_TABLET | Freq: Two times a day (BID) | ORAL | 1 refills | Status: DC

## 2021-05-07 NOTE — Telephone Encounter (Addendum)
Initiated Prior authorization [id number] 75 mg ?Via: Covermymeds ?Case/Key:[id number] ?Status: denied as of 05/07/2021 ?Reason:Coverage for this medication is denied for the following reason(s). We reviewed the information we ?received about your condition and circumstances. We used the policy plan approved criteria when ?making this decision. The policy states that this medication may be approved when: ?- The member has a clinical condition or needs a specific dosage form for which there is no alternative on ?the formulary OR ?- The listed formulary alternatives are not recommended based on published guidelines or clinical ?literature OR ?- The formulary alternatives will likely be ineffective or less effective for the member OR ?- The formulary alternatives will likely cause an adverse effect OR ?- The member is unable to take the required number of formulary alternatives for the given diagnosis ?due to a trial and inadequate treatment response or contraindication OR ?- The member has tried and failed the required number of formulary alternatives. ?Based on the policy and the information we have, your request is denied. We did not receive any ?documentation that you meet any of the criteria outlined above. ?Formulary alternative(s) are Cindee Salt. Requirement: 3 in a class with 3 or more alternatives, 2 in ?a class with 2 alternatives, or 1 in a class with only 1 alternative. Please refer to your plan documents for ?a complete list of alternatives. ?Notified Pt via: Mychart ?

## 2021-05-12 ENCOUNTER — Other Ambulatory Visit: Payer: Self-pay | Admitting: Physician Assistant

## 2021-05-12 DIAGNOSIS — G8929 Other chronic pain: Secondary | ICD-10-CM

## 2021-05-15 ENCOUNTER — Encounter: Payer: Self-pay | Admitting: Physician Assistant

## 2021-05-19 ENCOUNTER — Encounter: Payer: Self-pay | Admitting: Physician Assistant

## 2021-05-21 ENCOUNTER — Encounter: Payer: Self-pay | Admitting: Physician Assistant

## 2021-05-26 ENCOUNTER — Other Ambulatory Visit: Payer: Self-pay | Admitting: Physician Assistant

## 2021-05-26 DIAGNOSIS — G8929 Other chronic pain: Secondary | ICD-10-CM

## 2021-05-28 ENCOUNTER — Other Ambulatory Visit: Payer: Self-pay | Admitting: Physician Assistant

## 2021-05-28 NOTE — Telephone Encounter (Signed)
Last written 04/25/2021 #14 no refills ?Last appt 04/01/2021, has appt in May ?

## 2021-05-29 ENCOUNTER — Telehealth: Payer: Self-pay

## 2021-05-29 NOTE — Telephone Encounter (Addendum)
Initiated Prior authorization [id number] Sodium 20MG  dr tablets ?Via: Covermymeds ?Case/Key:[id number] ?Status: approved as of 05/29/21 ?Reason:, this request is approved from 05/29/2021 to 02/22/2038. ?Notified Pt via: Mychart ?

## 2021-05-31 ENCOUNTER — Other Ambulatory Visit: Payer: Self-pay | Admitting: Physician Assistant

## 2021-05-31 DIAGNOSIS — G8929 Other chronic pain: Secondary | ICD-10-CM

## 2021-06-03 ENCOUNTER — Other Ambulatory Visit: Payer: Self-pay | Admitting: Physician Assistant

## 2021-06-03 ENCOUNTER — Encounter: Payer: Self-pay | Admitting: Physician Assistant

## 2021-06-03 DIAGNOSIS — M7918 Myalgia, other site: Secondary | ICD-10-CM

## 2021-06-03 DIAGNOSIS — G894 Chronic pain syndrome: Secondary | ICD-10-CM

## 2021-06-03 DIAGNOSIS — G8929 Other chronic pain: Secondary | ICD-10-CM

## 2021-06-05 ENCOUNTER — Telehealth: Payer: Self-pay

## 2021-06-05 NOTE — Telephone Encounter (Addendum)
Resubmission Prior authorization [id number] 75MG  dispersible tablets ?Via: Covermymeds ?Case/Key:[id number] ?Status: approved ,  as of 06/05/21 ?Reason:this request is approved from 06/06/2021 to 06/07/2022 ?Notified Pt via: Mychart ?

## 2021-06-13 ENCOUNTER — Encounter: Payer: Self-pay | Admitting: Physician Assistant

## 2021-06-15 ENCOUNTER — Other Ambulatory Visit: Payer: Self-pay | Admitting: Physician Assistant

## 2021-06-22 ENCOUNTER — Other Ambulatory Visit: Payer: Self-pay | Admitting: Physician Assistant

## 2021-06-22 DIAGNOSIS — G8929 Other chronic pain: Secondary | ICD-10-CM

## 2021-06-22 DIAGNOSIS — F329 Major depressive disorder, single episode, unspecified: Secondary | ICD-10-CM

## 2021-06-24 ENCOUNTER — Encounter: Payer: Self-pay | Admitting: Physician Assistant

## 2021-06-24 DIAGNOSIS — G8929 Other chronic pain: Secondary | ICD-10-CM

## 2021-06-25 ENCOUNTER — Encounter: Payer: Self-pay | Admitting: Physician Assistant

## 2021-06-25 ENCOUNTER — Other Ambulatory Visit: Payer: Self-pay | Admitting: Physician Assistant

## 2021-06-25 DIAGNOSIS — F329 Major depressive disorder, single episode, unspecified: Secondary | ICD-10-CM

## 2021-06-25 MED ORDER — QUETIAPINE FUMARATE 25 MG PO TABS: 25.0000 mg | ORAL_TABLET | Freq: Every day | ORAL | 1 refills | Status: DC

## 2021-06-26 MED ORDER — DICLOFENAC SODIUM 1 % EX GEL: CUTANEOUS | 1 refills | Status: DC

## 2021-07-01 ENCOUNTER — Ambulatory Visit (INDEPENDENT_AMBULATORY_CARE_PROVIDER_SITE_OTHER): Payer: 59 | Admitting: Physician Assistant

## 2021-07-01 ENCOUNTER — Encounter: Payer: Self-pay | Admitting: Physician Assistant

## 2021-07-01 VITALS — BP 112/77 | HR 79

## 2021-07-01 DIAGNOSIS — Z23 Encounter for immunization: Secondary | ICD-10-CM | POA: Diagnosis not present

## 2021-07-01 DIAGNOSIS — K2100 Gastro-esophageal reflux disease with esophagitis, without bleeding: Secondary | ICD-10-CM | POA: Diagnosis not present

## 2021-07-01 DIAGNOSIS — R4189 Other symptoms and signs involving cognitive functions and awareness: Secondary | ICD-10-CM | POA: Diagnosis not present

## 2021-07-01 DIAGNOSIS — R110 Nausea: Secondary | ICD-10-CM

## 2021-07-01 DIAGNOSIS — R443 Hallucinations, unspecified: Secondary | ICD-10-CM | POA: Diagnosis not present

## 2021-07-01 DIAGNOSIS — G9332 Myalgic encephalomyelitis/chronic fatigue syndrome: Secondary | ICD-10-CM | POA: Diagnosis not present

## 2021-07-01 MED ORDER — QUETIAPINE FUMARATE 25 MG PO TABS: 25.0000 mg | ORAL_TABLET | Freq: Every day | ORAL | 0 refills | Status: DC

## 2021-07-01 MED ORDER — METHYLPHENIDATE HCL ER (OSM) 27 MG PO TBCR: 27.0000 mg | EXTENDED_RELEASE_TABLET | ORAL | 0 refills | Status: DC

## 2021-07-01 MED ORDER — SUCRALFATE 1 G PO TABS: 1.0000 g | ORAL_TABLET | Freq: Three times a day (TID) | ORAL | 0 refills | Status: DC

## 2021-07-01 MED ORDER — VORTIOXETINE HBR 10 MG PO TABS: 10.0000 mg | ORAL_TABLET | Freq: Every day | ORAL | 0 refills | Status: DC

## 2021-07-01 NOTE — Patient Instructions (Addendum)
Stop ambien ?Start seroquel ?Increase trintellix to 10mg  ?Add concerta daily ?Use carafate as needed ?Get in with GI ?

## 2021-07-06 ENCOUNTER — Other Ambulatory Visit: Payer: Self-pay | Admitting: Physician Assistant

## 2021-07-06 DIAGNOSIS — F329 Major depressive disorder, single episode, unspecified: Secondary | ICD-10-CM

## 2021-07-10 ENCOUNTER — Encounter: Payer: Self-pay | Admitting: Physician Assistant

## 2021-07-10 NOTE — Progress Notes (Signed)
Established Patient Office Visit  Subjective   Patient ID: Sarah Mayer, female    DOB: 1971-02-08  Age: 51 y.o. MRN: [id number]  Chief Complaint  Patient presents with   Follow-up    HPI Pt is a 51 yo obese female with complicated medical history who presents to the clinic for follow up and medication adjustment.   Pt has been struggling with cognition issues for a few years but seems to be getting worse. She is having more and more episodes of "spacing out". She recalls times where her husband will find her interacting in another world. She might be putting up imaginary clothes in bathroom etc. She has problems processing thoughts. She does feel down and depressed. She is not sleeping well.   She is very fatigued and not able to get adderall at pharmacy. She feels like this is effecting her word finding ability as well.   Her reflux has been worse at night. She does have GI. She is on PPI bid. No melena or hematochezia.   [name]. Active Ambulatory Problems    Diagnosis Date Noted   Psoriatic arthritis (HCC) 03/24/2016   Gastroesophageal reflux disease 03/24/2016   GAD (generalized anxiety disorder) 03/24/2016   Iron deficiency anemia 03/24/2016   History of pulmonary embolus (PE) 03/27/2016   History of DVT (deep vein thrombosis) 03/27/2016   Chronic tension-type headache, not intractable 03/27/2016   Primary insomnia 03/27/2016   Bilateral lower extremity edema 03/27/2016   Chronic pain syndrome 05/25/2016   Morbidly obese (HCC) 05/27/2016   Serum calcium elevated 08/30/2016   Diarrhea 08/30/2016   Collagenous colitis 10/18/2016   Barrett's esophagus without dysplasia 10/18/2016   Arthralgia 10/18/2016   Memory changes 11/23/2016   Major depressive disorder with current active episode 11/23/2016   OSA (obstructive sleep apnea) 11/23/2016   Twitching 12/22/2016   Upper airway cough syndrome 12/22/2016   Bilateral hip pain 12/22/2016   Tripping over things 01/12/2017    Left arm pain 01/12/2017   Left elbow pain 01/12/2017   Numbness and tingling in left arm 01/17/2017   Word finding difficulty 03/15/2017   Intractable migraine without aura and with status migrainosus 03/15/2017   Edema of both feet 07/04/2017   B12 deficiency 07/04/2017   Acquired hypothyroidism 07/04/2017   Vitamin D deficiency 07/04/2017   Secondary hyperparathyroidism, non-renal (HCC) 07/04/2017   Psoriasis 07/06/2017   CFS (chronic fatigue syndrome) 07/06/2017   Easy bruising 07/26/2017   Myofascial pain 08/19/2017   Itching 08/19/2017   Yeast infection 08/19/2017   IBS (irritable bowel syndrome) 12/03/2017   Recurrent falls 12/29/2017   Weakness 02/11/2018   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   Urinary frequency 02/14/2018   Chronic left shoulder pain 03/15/2018   Chronic pain of left thumb 03/15/2018   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   Jerking 09/12/2018   Episode of confusion 09/12/2018   Bilateral leg pain 11/14/2018   Dry mouth 11/14/2018   Bilateral leg weakness 01/23/2019   Skin infection 01/23/2019   Chronic nonintractable headache 02/07/2019   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   Intertrigo 08/11/2019   Melasma 08/11/2019   OAB (overactive bladder) 08/11/2019   DDD (degenerative disc disease), lumbar 01/23/2020   Osteoarthritis, hip, bilateral 01/23/2020   Cough, persistent 06/24/2020   COVID-19 long hauler 02/18/2021   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   Hallucinations 07/01/2021   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   Nausea 07/01/2021   Gastroesophageal reflux disease with esophagitis without hemorrhage 07/01/2021   Resolved Ambulatory Problems    Diagnosis Date Noted  Hyperparathyroidism, primary (HCC) 09/18/2016   Rash and nonspecific skin eruption 10/18/2016   Acute bronchitis 02/07/2018   Elevated serum creatinine 11/14/2018   Pneumonia of left upper lobe due to Chlamydia species 08/11/2019   Acute cystitis without hematuria 07/10/2020   Past Medical History:   Diagnosis Date   History of pulmonary embolism 03/27/2016   Microscopic colitis    Migraine    Morbid obesity (HCC) 05/27/2016    ROS   See HPI.  Objective:     BP 112/77   Pulse 79   SpO2 96%  BP Readings from Last 3 Encounters:  07/01/21 112/77  04/01/21 124/82  02/10/21 98/69   Wt Readings from Last 3 Encounters:  01/31/21 (!) 317 lb (143.8 kg)  10/18/19 (!) 365 lb (165.6 kg)  09/12/19 (!) 364 lb (165.1 kg)    ..    07/01/2021    1:41 PM 04/01/2021   10:10 AM 01/01/2021   10:15 AM 01/22/2020    1:56 PM 08/07/2019    4:09 PM  Depression screen PHQ 2/9  Decreased Interest 1 1 1 1 1   Down, Depressed, Hopeless 3 3 3 2 3   PHQ - 2 Score 4 4 4 3 4   Altered sleeping 3 3 3 1 3   Tired, decreased energy 3 3 3 3 3   Change in appetite 1 1 1 1 2   Feeling bad or failure about yourself  2 1 1 1  0  Trouble concentrating 3 3 3 3 2   Moving slowly or fidgety/restless 3 0 1 1 1   Suicidal thoughts 0 0 0 0 0  PHQ-9 Score 19 15 16 13 15   Difficult doing work/chores Extremely dIfficult Somewhat difficult Extremely dIfficult Extremely dIfficult Extremely dIfficult   ..    07/01/2021    1:42 PM 04/01/2021   10:10 AM 01/01/2021   10:15 AM 01/22/2020    1:56 PM  GAD 7 : Generalized Anxiety Score  Nervous, Anxious, on Edge 3 3 3 3   Control/stop worrying 3 3 3 3   Worry too much - different things 3 3 3 3   Trouble relaxing 3 3 3 3   Restless 3 2 1 1   Easily annoyed or irritable 2 1 2 2   Afraid - awful might happen 3 1 2 1   Total GAD 7 Score 20 16 17 16   Anxiety Difficulty Extremely difficult Somewhat difficult Extremely difficult Somewhat difficult      Physical Exam Vitals reviewed.  Constitutional:      Appearance: Normal appearance. She is obese.  HENT:     Head: Normocephalic.  Cardiovascular:     Rate and Rhythm: Normal rate and regular rhythm.     Pulses: Normal pulses.     Heart sounds: Normal heart sounds.  Pulmonary:     Effort: Pulmonary effort is normal.     Breath  sounds: Normal breath sounds.  Neurological:     General: No focal deficit present.     Mental Status: She is alert and oriented to person, place, and time.  Psychiatric:        Mood and Affect: Mood normal.         Assessment & Plan:  [name].[name].[name] was seen today for follow-up.  Diagnoses and all orders for this visit:  Pseudodementia -     vortioxetine HBr (TRINTELLIX) 10 MG TABS tablet; Take 1 tablet (10 mg total) by mouth daily.  Hallucinations -     vortioxetine HBr (TRINTELLIX) 10 MG TABS tablet; Take 1 tablet (10 mg  total) by mouth daily. -     QUEtiapine (SEROQUEL) 25 MG tablet; Take 1 tablet (25 mg total) by mouth at bedtime.  Nausea  Gastroesophageal reflux disease with esophagitis without hemorrhage -     sucralfate (CARAFATE) 1 g tablet; Take 1 tablet (1 g total) by mouth 4 (four) times daily -  with meals and at bedtime.  Need for shingles vaccine -     Varicella-zoster vaccine IM  CFS (chronic fatigue syndrome) -     methylphenidate (CONCERTA) 27 MG PO CR tablet; Take 1 tablet (27 mg total) by mouth every morning.  Unclear etiology of symptoms but will make some changes today and see if helps Not able to find adderall will replace with concerta. Stop [employer] seroquel for sleep and hallucinations Increase trintellix to 10mg  Use carafate as needed Get appt with GI(pt already established)    Return in about 6 weeks (around 08/12/2021).    08/14/2021, PA-C

## 2021-07-13 ENCOUNTER — Other Ambulatory Visit: Payer: Self-pay | Admitting: Physician Assistant

## 2021-07-13 DIAGNOSIS — F410 Panic disorder [episodic paroxysmal anxiety] without agoraphobia: Secondary | ICD-10-CM

## 2021-07-21 ENCOUNTER — Encounter: Payer: Self-pay | Admitting: Physician Assistant

## 2021-07-31 ENCOUNTER — Encounter: Payer: Self-pay | Admitting: Physician Assistant

## 2021-08-02 ENCOUNTER — Encounter: Payer: Self-pay | Admitting: Physician Assistant

## 2021-08-04 MED ORDER — RABEPRAZOLE SODIUM 20 MG PO TBEC: 20.0000 mg | DELAYED_RELEASE_TABLET | Freq: Two times a day (BID) | ORAL | 3 refills | Status: DC

## 2021-08-05 ENCOUNTER — Encounter: Payer: Self-pay | Admitting: Physician Assistant

## 2021-08-05 DIAGNOSIS — L4050 Arthropathic psoriasis, unspecified: Secondary | ICD-10-CM

## 2021-08-05 DIAGNOSIS — G894 Chronic pain syndrome: Secondary | ICD-10-CM

## 2021-08-06 ENCOUNTER — Encounter: Payer: Self-pay | Admitting: Physician Assistant

## 2021-08-06 MED ORDER — HYDROCODONE-ACETAMINOPHEN 10-325 MG PO TABS: 1.0000 | ORAL_TABLET | Freq: Two times a day (BID) | ORAL | 0 refills | Status: AC

## 2021-08-06 MED ORDER — HYDROCODONE-ACETAMINOPHEN 10-325 MG PO TABS: 1.0000 | ORAL_TABLET | Freq: Two times a day (BID) | ORAL | 0 refills | Status: DC | PRN

## 2021-08-06 MED ORDER — HYDROCODONE-ACETAMINOPHEN 10-325 MG PO TABS: 1.0000 | ORAL_TABLET | Freq: Two times a day (BID) | ORAL | 0 refills | Status: AC | PRN

## 2021-08-06 NOTE — Telephone Encounter (Signed)
LVM for patient to call back to get appt scheduled, but wife's last message stated he has insurance that doesn't cover [facility]. AMUCK

## 2021-08-12 DIAGNOSIS — L439 Lichen planus, unspecified: Secondary | ICD-10-CM | POA: Diagnosis not present

## 2021-08-12 DIAGNOSIS — Z79899 Other long term (current) drug therapy: Secondary | ICD-10-CM | POA: Diagnosis not present

## 2021-08-12 DIAGNOSIS — L4050 Arthropathic psoriasis, unspecified: Secondary | ICD-10-CM | POA: Diagnosis not present

## 2021-08-12 DIAGNOSIS — L409 Psoriasis, unspecified: Secondary | ICD-10-CM | POA: Diagnosis not present

## 2021-08-12 DIAGNOSIS — L400 Psoriasis vulgaris: Secondary | ICD-10-CM | POA: Diagnosis not present

## 2021-08-12 DIAGNOSIS — Z5181 Encounter for therapeutic drug level monitoring: Secondary | ICD-10-CM | POA: Diagnosis not present

## 2021-08-13 ENCOUNTER — Ambulatory Visit: Payer: 59 | Admitting: Physician Assistant

## 2021-08-13 ENCOUNTER — Other Ambulatory Visit: Payer: Self-pay | Admitting: Neurology

## 2021-08-13 DIAGNOSIS — G8929 Other chronic pain: Secondary | ICD-10-CM

## 2021-08-20 ENCOUNTER — Other Ambulatory Visit: Payer: Self-pay | Admitting: Neurology

## 2021-08-20 DIAGNOSIS — G8929 Other chronic pain: Secondary | ICD-10-CM

## 2021-08-20 MED ORDER — DICLOFENAC SODIUM 1 % EX GEL: CUTANEOUS | 1 refills | Status: DC

## 2021-08-21 ENCOUNTER — Ambulatory Visit (INDEPENDENT_AMBULATORY_CARE_PROVIDER_SITE_OTHER): Payer: 59 | Admitting: Physician Assistant

## 2021-08-21 VITALS — BP 128/85 | HR 102

## 2021-08-21 DIAGNOSIS — Z131 Encounter for screening for diabetes mellitus: Secondary | ICD-10-CM

## 2021-08-21 DIAGNOSIS — Z13220 Encounter for screening for lipoid disorders: Secondary | ICD-10-CM | POA: Diagnosis not present

## 2021-08-21 DIAGNOSIS — E559 Vitamin D deficiency, unspecified: Secondary | ICD-10-CM

## 2021-08-21 DIAGNOSIS — R7301 Impaired fasting glucose: Secondary | ICD-10-CM

## 2021-08-21 DIAGNOSIS — G479 Sleep disorder, unspecified: Secondary | ICD-10-CM

## 2021-08-21 DIAGNOSIS — R443 Hallucinations, unspecified: Secondary | ICD-10-CM

## 2021-08-21 DIAGNOSIS — R339 Retention of urine, unspecified: Secondary | ICD-10-CM | POA: Diagnosis not present

## 2021-08-21 DIAGNOSIS — R809 Proteinuria, unspecified: Secondary | ICD-10-CM

## 2021-08-21 DIAGNOSIS — R82998 Other abnormal findings in urine: Secondary | ICD-10-CM

## 2021-08-21 DIAGNOSIS — E538 Deficiency of other specified B group vitamins: Secondary | ICD-10-CM | POA: Diagnosis not present

## 2021-08-21 DIAGNOSIS — R4189 Other symptoms and signs involving cognitive functions and awareness: Secondary | ICD-10-CM | POA: Diagnosis not present

## 2021-08-21 DIAGNOSIS — E211 Secondary hyperparathyroidism, not elsewhere classified: Secondary | ICD-10-CM

## 2021-08-21 DIAGNOSIS — G894 Chronic pain syndrome: Secondary | ICD-10-CM

## 2021-08-21 DIAGNOSIS — E039 Hypothyroidism, unspecified: Secondary | ICD-10-CM | POA: Diagnosis not present

## 2021-08-21 DIAGNOSIS — G43011 Migraine without aura, intractable, with status migrainosus: Secondary | ICD-10-CM

## 2021-08-21 DIAGNOSIS — N760 Acute vaginitis: Secondary | ICD-10-CM

## 2021-08-21 DIAGNOSIS — R350 Frequency of micturition: Secondary | ICD-10-CM

## 2021-08-21 DIAGNOSIS — G9332 Myalgic encephalomyelitis/chronic fatigue syndrome: Secondary | ICD-10-CM | POA: Diagnosis not present

## 2021-08-21 DIAGNOSIS — F329 Major depressive disorder, single episode, unspecified: Secondary | ICD-10-CM

## 2021-08-21 DIAGNOSIS — L4050 Arthropathic psoriasis, unspecified: Secondary | ICD-10-CM

## 2021-08-21 DIAGNOSIS — F410 Panic disorder [episodic paroxysmal anxiety] without agoraphobia: Secondary | ICD-10-CM

## 2021-08-21 LAB — POCT URINALYSIS DIP (CLINITEK)
Bilirubin, UA: NEGATIVE
Blood, UA: NEGATIVE
Glucose, UA: NEGATIVE mg/dL
Nitrite, UA: NEGATIVE
POC PROTEIN,UA: NEGATIVE
Spec Grav, UA: 1.01 (ref 1.010–1.025)
Urobilinogen, UA: 1 E.U./dL
pH, UA: 6 (ref 5.0–8.0)

## 2021-08-21 MED ORDER — METHYLPHENIDATE HCL ER (OSM) 36 MG PO TBCR: 36.0000 mg | EXTENDED_RELEASE_TABLET | Freq: Every day | ORAL | 0 refills | Status: DC

## 2021-08-21 MED ORDER — QUETIAPINE FUMARATE 50 MG PO TABS: 50.0000 mg | ORAL_TABLET | Freq: Every day | ORAL | 0 refills | Status: DC

## 2021-08-21 MED ORDER — NITROFURANTOIN MONOHYD MACRO 100 MG PO CAPS: 100.0000 mg | ORAL_CAPSULE | Freq: Two times a day (BID) | ORAL | 0 refills | Status: DC

## 2021-08-21 MED ORDER — VORTIOXETINE HBR 20 MG PO TABS: 20.0000 mg | ORAL_TABLET | Freq: Every day | ORAL | 0 refills | Status: DC

## 2021-08-21 MED ORDER — FLUCONAZOLE 150 MG PO TABS: 150.0000 mg | ORAL_TABLET | Freq: Every day | ORAL | 0 refills | Status: DC

## 2021-08-21 NOTE — Patient Instructions (Signed)
Increased trintellix 20mg  Concerta to 36mg  Seroquel 50mg 

## 2021-08-21 NOTE — Progress Notes (Signed)
Established Patient Office Visit  Subjective   Patient ID: Sarah Mayer, female    DOB: Aug 12, 1970  Age: 51 y.o. MRN: [id number]  Chief Complaint  Patient presents with   Follow-up    HPI Pt is a 51 yo obese female with complex medical history to include chronic pain due to RA, MDD, GAD, insomnia who presents for follow up.   She is having some UTI like symptoms. Wants urine checked. No fever or chills.   She does feel like seroquel is helping sleep and mood but feels like might need to be increased again.   Pt does not feel like mood is where she would want it to be. She is depressed and tired.   Continues to be in pain and stiffiness but working with rheumatology.      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   COVID-19 long hauler 02/18/2021   Cough, persistent 06/24/2020   DDD (degenerative disc disease), lumbar 01/23/2020   Osteoarthritis, hip, bilateral 01/23/2020   Intertrigo 08/11/2019   Melasma 08/11/2019   OAB (overactive bladder) 08/11/2019   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   Chronic nonintractable headache 02/07/2019   Bilateral leg weakness 01/23/2019   Skin infection 01/23/2019   Bilateral leg pain 11/14/2018   Dry mouth 11/14/2018   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   Jerking 09/12/2018   Episode of confusion 09/12/2018   Chronic left shoulder pain 03/15/2018   Chronic pain of left thumb 03/15/2018   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   Urinary frequency 02/14/2018   Weakness 02/11/2018   Recurrent falls 12/29/2017   IBS (irritable bowel syndrome) 12/03/2017   Myofascial pain 08/19/2017   Itching 08/19/2017   Yeast infection 08/19/2017   Easy bruising 07/26/2017   Psoriasis 07/06/2017   CFS (chronic fatigue syndrome) 07/06/2017   Edema of both  feet 07/04/2017   B12 deficiency 07/04/2017   Acquired hypothyroidism 07/04/2017   Vitamin D deficiency 07/04/2017   Secondary hyperparathyroidism, non-renal ([place]) 07/04/2017   Word finding difficulty 03/15/2017   Intractable migraine without aura and with status migrainosus 03/15/2017   Numbness and tingling in left arm 01/17/2017   Tripping over things 01/12/2017   Left arm pain 01/12/2017   Left elbow pain 01/12/2017   Twitching 12/22/2016   Upper airway cough syndrome 12/22/2016   Bilateral hip pain 12/22/2016   Memory changes 11/23/2016   Major depressive disorder with current active episode 11/23/2016   OSA (obstructive sleep apnea) 11/23/2016   Collagenous colitis 10/18/2016   Barrett's esophagus without dysplasia 10/18/2016   Arthralgia 10/18/2016   Serum calcium elevated 08/30/2016   Diarrhea 08/30/2016   Morbidly obese ([place]) 05/27/2016   Chronic pain syndrome 05/25/2016   History of pulmonary embolus (PE) 03/27/2016   History of DVT (deep vein thrombosis) 03/27/2016   Chronic tension-type headache, not intractable 03/27/2016   Primary insomnia 03/27/2016   Bilateral lower extremity edema 03/27/2016   Psoriatic arthritis ([place]) 03/24/2016   Gastroesophageal reflux disease 03/24/2016   GAD (generalized anxiety disorder) 03/24/2016   Iron deficiency anemia 03/24/2016   Past Medical History:  Diagnosis Date   Chronic pain syndrome 05/25/2016   Gastroesophageal reflux disease 03/24/2016   History of DVT (deep vein thrombosis) 03/27/2016   History of pulmonary embolism 03/27/2016   IBS (irritable bowel syndrome)    Microscopic colitis  Migraine    Morbid obesity ([place]) 05/27/2016   Psoriatic arthritis ([place])    Family History  Problem Relation Age of Onset   Hypertension Mother    Hyperlipidemia Mother    Diabetes Mother    Dementia Father    Allergies  Allergen Reactions   Imitrex [Sumatriptan]     Dizziness/somulence/nausea   Methotrexate Derivatives     Hair  fall out   Moxifloxacin Hcl In Nacl Rash   Nsaids     IRRITATES "COLITIS"   Topamax [Topiramate]     rash   Remeron [Mirtazapine]     Hair fall out.    Clarithromycin     Unable to remember   Penicillins Rash   Sulfa Antibiotics Rash   Sumatriptan Succinate Nausea And Vomiting    19 Jan 2011      ROS See HPI.    Objective:     BP 128/85   Pulse (!) 102   SpO2 100%  BP Readings from Last 3 Encounters:  08/21/21 128/85  07/01/21 112/77  04/01/21 124/82   Wt Readings from Last 3 Encounters:  01/31/21 (!) 317 lb (143.8 kg)  10/18/19 (!) 365 lb (165.6 kg)  09/12/19 (!) 364 lb (165.1 kg)    .Sarah Mayer Results for orders placed or performed in visit on 08/21/21  Lipid Panel w/reflex Direct LDL  Result Value Ref Range   Cholesterol 134 <200 mg/dL   HDL 47 (L) > OR = 50 mg/dL   Triglycerides 68 <150 mg/dL   LDL Cholesterol (Calc) 73 mg/dL (calc)   Total CHOL/HDL Ratio 2.9 <5.0 (calc)   Non-HDL Cholesterol (Calc) 87 <130 mg/dL (calc)  B12 and Folate Panel  Result Value Ref Range   Vitamin B-12 453 200 - 1,100 pg/mL   Folate 8.4 ng/mL  COMPLETE METABOLIC PANEL WITH GFR  Result Value Ref Range   Glucose, Bld 111 (H) 65 - 99 mg/dL   BUN 11 7 - 25 mg/dL   Creat 0.94 0.50 - 1.03 mg/dL   eGFR 74 > OR = 60 mL/min/1.53m   BUN/Creatinine Ratio NOT APPLICABLE 6 - 22 (calc)   Sodium 136 135 - 146 mmol/L   Potassium 4.5 3.5 - 5.3 mmol/L   Chloride 100 98 - 110 mmol/L   CO2 26 20 - 32 mmol/L   Calcium 9.7 8.6 - 10.4 mg/dL   Total Protein 6.8 6.1 - 8.1 g/dL   Albumin 3.6 3.6 - 5.1 g/dL   Globulin 3.2 1.9 - 3.7 g/dL (calc)   AG Ratio 1.1 1.0 - 2.5 (calc)   Total Bilirubin 0.4 0.2 - 1.2 mg/dL   Alkaline phosphatase (APISO) 124 37 - 153 U/L   AST 11 10 - 35 U/L   ALT 9 6 - 29 U/L  VITAMIN D 25 Hydroxy (Vit-D Deficiency, Fractures)  Result Value Ref Range   Vit D, 25-Hydroxy 54 30 - 100 ng/mL  PTH, Intact and Calcium  Result Value Ref Range   Calcium 9.7 8.6 - 10.4 mg/dL  TSH   Result Value Ref Range   TSH 1.17 mIU/L  POCT URINALYSIS DIP (CLINITEK)  Result Value Ref Range   Color, UA orange (A) yellow   Clarity, UA cloudy (A) clear   Glucose, UA negative negative mg/dL   Bilirubin, UA negative negative   Ketones, POC UA trace (5) (A) negative mg/dL   Spec Grav, UA 1.010 1.010 - 1.025   Blood, UA negative negative   pH, UA 6.0 5.0 - 8.0  POC PROTEIN,UA negative negative, trace   Urobilinogen, UA 1.0 0.2 or 1.0 E.U./dL   Nitrite, UA Negative Negative   Leukocytes, UA Trace (A) Negative     Physical Exam Constitutional:      Appearance: Normal appearance. She is obese.  HENT:     Head: Normocephalic.  Cardiovascular:     Rate and Rhythm: Normal rate and regular rhythm.  Pulmonary:     Effort: Pulmonary effort is normal.     Breath sounds: Normal breath sounds.  Abdominal:     General: There is no distension.     Palpations: Abdomen is soft. There is no mass.     Tenderness: There is abdominal tenderness. There is no right CVA tenderness, left CVA tenderness, guarding or rebound.  Musculoskeletal:     Cervical back: Normal range of motion and neck supple.     Right lower leg: No edema.     Left lower leg: No edema.  Neurological:     General: No focal deficit present.     Mental Status: She is alert and oriented to person, place, and time.  Psychiatric:        Mood and Affect: Mood normal.         Assessment & Plan:  Sarah KitchenMarland KitchenMckinsley was seen today for follow-up.  Diagnoses and all orders for this visit:  Urinary retention -     Urine Culture -     POCT URINALYSIS DIP (CLINITEK) -     nitrofurantoin, macrocrystal-monohydrate, (MACROBID) 100 MG capsule; Take 1 capsule (100 mg total) by mouth 2 (two) times daily.  Urinary frequency -     Urine Culture -     POCT URINALYSIS DIP (CLINITEK) -     nitrofurantoin, macrocrystal-monohydrate, (MACROBID) 100 MG capsule; Take 1 capsule (100 mg total) by mouth 2 (two) times daily.  CFS (chronic  fatigue syndrome) -     methylphenidate (CONCERTA) 36 MG PO CR tablet; Take 1 tablet (36 mg total) by mouth daily.  Pseudodementia  Hallucinations -     QUEtiapine (SEROQUEL) 50 MG tablet; Take 1 tablet (50 mg total) by mouth at bedtime.  Psoriatic arthritis (HCC)  Chronic pain syndrome  Major depressive disorder with current active episode, unspecified depression episode severity, unspecified whether recurrent -     QUEtiapine (SEROQUEL) 50 MG tablet; Take 1 tablet (50 mg total) by mouth at bedtime. -     vortioxetine HBr (TRINTELLIX) 20 MG TABS tablet; Take 1 tablet (20 mg total) by mouth daily.  Panic attacks -     vortioxetine HBr (TRINTELLIX) 20 MG TABS tablet; Take 1 tablet (20 mg total) by mouth daily.  Intractable migraine without aura and with status migrainosus  Acquired hypothyroidism -     TSH  Screening for lipid disorders -     Lipid Panel w/reflex Direct LDL  Screening for diabetes mellitus -     COMPLETE METABOLIC PANEL WITH GFR  Vitamin D deficiency -     VITAMIN D 25 Hydroxy (Vit-D Deficiency, Fractures)  Secondary hyperparathyroidism, non-renal (HCC) -     PTH, Intact and Calcium  B12 deficiency -     B12 and Folate Panel  Acute vaginitis -     fluconazole (DIFLUCAN) 150 MG tablet; Take 1 tablet (150 mg total) by mouth daily. Repeat in 48-72 hours if symptoms persist.  Trouble in sleeping -     QUEtiapine (SEROQUEL) 50 MG tablet; Take 1 tablet (50 mg total) by mouth at bedtime.  Leukocytes in  urine -     nitrofurantoin, macrocrystal-monohydrate, (MACROBID) 100 MG capsule; Take 1 capsule (100 mg total) by mouth 2 (two) times daily.  Proteinuria, unspecified type -     nitrofurantoin, macrocrystal-monohydrate, (MACROBID) 100 MG capsule; Take 1 capsule (100 mg total) by mouth 2 (two) times daily.  UA did show some leuks and protein Will culture Treated empirically based on symptoms with macrobid  Send diflucan for vaginitis  For insomnia  increased seroquel to 58m  For CFS increased concerta to 336UZ For depression and cognition increased trintellix to 271m Follow up in 6 weeks.       JaIran PlanasPA-C

## 2021-08-22 LAB — TSH: TSH: 1.17 mIU/L

## 2021-08-22 LAB — PTH, INTACT AND CALCIUM
Calcium: 9.7 mg/dL (ref 8.6–10.4)
PTH: 87 pg/mL — ABNORMAL HIGH (ref 16–77)

## 2021-08-22 LAB — VITAMIN D 25 HYDROXY (VIT D DEFICIENCY, FRACTURES): Vit D, 25-Hydroxy: 54 ng/mL (ref 30–100)

## 2021-08-22 LAB — LIPID PANEL W/REFLEX DIRECT LDL
Cholesterol: 134 mg/dL (ref <200)
HDL: 47 mg/dL — ABNORMAL LOW (ref >=50)
LDL Cholesterol (Calc): 73 mg/dL (calc)
Non-HDL Cholesterol (Calc): 87 mg/dL (calc) (ref <130)
Total CHOL/HDL Ratio: 2.9 (calc) (ref <5.0)
Triglycerides: 68 mg/dL (ref <150)

## 2021-08-22 LAB — COMPLETE METABOLIC PANEL WITH GFR
AG Ratio: 1.1 (calc) (ref 1.0–2.5)
ALT: 9 U/L (ref 6–29)
AST: 11 U/L (ref 10–35)
Albumin: 3.6 g/dL (ref 3.6–5.1)
Alkaline phosphatase (APISO): 124 U/L (ref 37–153)
BUN: 11 mg/dL (ref 7–25)
CO2: 26 mmol/L (ref 20–32)
Calcium: 9.7 mg/dL (ref 8.6–10.4)
Chloride: 100 mmol/L (ref 98–110)
Creat: 0.94 mg/dL (ref 0.50–1.03)
Globulin: 3.2 g/dL (calc) (ref 1.9–3.7)
Glucose, Bld: 111 mg/dL — ABNORMAL HIGH (ref 65–99)
Potassium: 4.5 mmol/L (ref 3.5–5.3)
Sodium: 136 mmol/L (ref 135–146)
Total Bilirubin: 0.4 mg/dL (ref 0.2–1.2)
Total Protein: 6.8 g/dL (ref 6.1–8.1)
eGFR: 74 mL/min/{1.73_m2} (ref >=60)

## 2021-08-22 LAB — HEMOGLOBIN A1C W/OUT EAG

## 2021-08-22 LAB — B12 AND FOLATE PANEL
Folate: 8.4 ng/mL
Vitamin B-12: 453 pg/mL (ref 200–1100)

## 2021-08-22 NOTE — Progress Notes (Signed)
Sarah Mayer,   Fasting glucose up some. JJ will you add A1C.  Kidney and liver look ok.  Vitamin B12 is above 400 and in normal range. Looks good.  Vitamin D looks great.  Thyroid is perfect.  Cholesterol looks good.   Pending PTH/Calcium and urine culture.

## 2021-08-22 NOTE — Progress Notes (Signed)
Calcium good and PTH better than 7 months ago.   Why was a1c canceled JJ?

## 2021-08-23 LAB — URINE CULTURE
MICRO NUMBER:: 13593082
Result:: NO GROWTH
SPECIMEN QUALITY:: ADEQUATE

## 2021-08-24 ENCOUNTER — Encounter: Payer: Self-pay | Admitting: Physician Assistant

## 2021-08-25 NOTE — Telephone Encounter (Signed)
Unfortunately the note is not completed so I am not sure if there was a specific care planned that she had in mind.  In regards to pain she is actually already on hydrocodone some not sure what additional medication she was considering.  But if her pain is predominantly around the knee I am more than happy to refer her to Dr. Jordan Likes at her [facility] location who is a sports medicine provider since our current sports med doc is out.

## 2021-08-25 NOTE — Telephone Encounter (Signed)
Okay, this sounds related to the joints.  Lets see if we can get her in with sports med or Ortho.  Which ever is more accessible.

## 2021-08-25 NOTE — Progress Notes (Signed)
HI Sarah Mayer,  Hopefully you are feeling better.  I am covering for [facility] while she is out of the office today.  Your urine culture came back negative.  So no sign of urinary tract infection.

## 2021-08-28 NOTE — Telephone Encounter (Signed)
Patient has been scheduled for 09/24/2021, patient stated she had to wait until August due to her insurance kicking in then. AMUCK

## 2021-09-04 ENCOUNTER — Encounter: Payer: Self-pay | Admitting: Physician Assistant

## 2021-09-05 ENCOUNTER — Encounter: Payer: Self-pay | Admitting: Physician Assistant

## 2021-09-12 ENCOUNTER — Encounter: Payer: Self-pay | Admitting: Physician Assistant

## 2021-09-21 ENCOUNTER — Other Ambulatory Visit: Payer: Self-pay | Admitting: Physician Assistant

## 2021-09-21 DIAGNOSIS — K2100 Gastro-esophageal reflux disease with esophagitis, without bleeding: Secondary | ICD-10-CM

## 2021-09-21 DIAGNOSIS — G894 Chronic pain syndrome: Secondary | ICD-10-CM

## 2021-09-21 DIAGNOSIS — M7918 Myalgia, other site: Secondary | ICD-10-CM

## 2021-09-24 ENCOUNTER — Ambulatory Visit: Payer: Medicaid Other | Admitting: Sports Medicine

## 2021-09-24 ENCOUNTER — Encounter: Payer: Self-pay | Admitting: Physician Assistant

## 2021-09-24 DIAGNOSIS — Z5181 Encounter for therapeutic drug level monitoring: Secondary | ICD-10-CM | POA: Diagnosis not present

## 2021-09-24 DIAGNOSIS — L400 Psoriasis vulgaris: Secondary | ICD-10-CM | POA: Diagnosis not present

## 2021-09-26 ENCOUNTER — Emergency Department (HOSPITAL_BASED_OUTPATIENT_CLINIC_OR_DEPARTMENT_OTHER): Payer: 59

## 2021-09-26 ENCOUNTER — Emergency Department (HOSPITAL_BASED_OUTPATIENT_CLINIC_OR_DEPARTMENT_OTHER)
Admission: EM | Admit: 2021-09-26 | Discharge: 2021-09-27 | Disposition: A | Discharge status: Home / Self Care | Payer: 59 | Attending: Emergency Medicine | Admitting: Emergency Medicine

## 2021-09-26 ENCOUNTER — Encounter (HOSPITAL_BASED_OUTPATIENT_CLINIC_OR_DEPARTMENT_OTHER): Payer: Self-pay | Admitting: Emergency Medicine

## 2021-09-26 ENCOUNTER — Other Ambulatory Visit: Payer: Self-pay

## 2021-09-26 DIAGNOSIS — Z7901 Long term (current) use of anticoagulants: Secondary | ICD-10-CM | POA: Diagnosis not present

## 2021-09-26 DIAGNOSIS — R079 Chest pain, unspecified: Secondary | ICD-10-CM | POA: Diagnosis not present

## 2021-09-26 DIAGNOSIS — Z7951 Long term (current) use of inhaled steroids: Secondary | ICD-10-CM | POA: Diagnosis not present

## 2021-09-26 DIAGNOSIS — L304 Erythema intertrigo: Secondary | ICD-10-CM | POA: Insufficient documentation

## 2021-09-26 DIAGNOSIS — K449 Diaphragmatic hernia without obstruction or gangrene: Secondary | ICD-10-CM | POA: Diagnosis not present

## 2021-09-26 DIAGNOSIS — R0789 Other chest pain: Secondary | ICD-10-CM | POA: Insufficient documentation

## 2021-09-26 LAB — CBC
HCT: 42.6 % (ref 36.0–46.0)
Hemoglobin: 13.1 g/dL (ref 12.0–15.0)
MCH: 24.9 pg — ABNORMAL LOW (ref 26.0–34.0)
MCHC: 30.8 g/dL (ref 30.0–36.0)
MCV: 80.8 fL (ref 80.0–100.0)
Platelets: 507 10*3/uL — ABNORMAL HIGH (ref 150–400)
RBC: 5.27 MIL/uL — ABNORMAL HIGH (ref 3.87–5.11)
RDW: 14.8 % (ref 11.5–15.5)
WBC: 9.5 10*3/uL (ref 4.0–10.5)
nRBC: 0 % (ref 0.0–0.2)

## 2021-09-26 LAB — BASIC METABOLIC PANEL
Anion gap: 7 (ref 5–15)
BUN: 10 mg/dL (ref 6–20)
CO2: 24 mmol/L (ref 22–32)
Calcium: 9.7 mg/dL (ref 8.9–10.3)
Chloride: 105 mmol/L (ref 98–111)
Creatinine, Ser: 0.86 mg/dL (ref 0.44–1.00)
GFR, Estimated: >60 mL/min (ref >60)
Glucose, Bld: 118 mg/dL — ABNORMAL HIGH (ref 70–99)
Potassium: 3.9 mmol/L (ref 3.5–5.1)
Sodium: 136 mmol/L (ref 135–145)

## 2021-09-26 LAB — HCG, SERUM, QUALITATIVE: Preg, Serum: NEGATIVE

## 2021-09-26 LAB — TROPONIN I (HIGH SENSITIVITY): Troponin I (High Sensitivity): 2 ng/L (ref <18)

## 2021-09-26 NOTE — ED Notes (Signed)
Patient report she has been off of her blood thinners for approx. 1 month. States she is concerned that she has a blood clot due to her high risk and history of blood clots.

## 2021-09-26 NOTE — ED Triage Notes (Addendum)
Pt has history of PE in the past.  Pt started to have some chest tightness today.  Pt has been off Eliquis for one month due to expense.  Pt states she has a history of elevated platelets.  No sob.  No leg pain.  No headache.

## 2021-09-27 ENCOUNTER — Encounter (HOSPITAL_BASED_OUTPATIENT_CLINIC_OR_DEPARTMENT_OTHER): Payer: Self-pay

## 2021-09-27 ENCOUNTER — Emergency Department (HOSPITAL_BASED_OUTPATIENT_CLINIC_OR_DEPARTMENT_OTHER): Payer: 59

## 2021-09-27 ENCOUNTER — Emergency Department (HOSPITAL_BASED_OUTPATIENT_CLINIC_OR_DEPARTMENT_OTHER)
Admit: 2021-09-27 | Discharge: 2021-09-27 | Disposition: A | Discharge status: Home / Self Care | Payer: 59 | Attending: Emergency Medicine | Admitting: Emergency Medicine

## 2021-09-27 DIAGNOSIS — K449 Diaphragmatic hernia without obstruction or gangrene: Secondary | ICD-10-CM | POA: Diagnosis not present

## 2021-09-27 DIAGNOSIS — I82403 Acute embolism and thrombosis of unspecified deep veins of lower extremity, bilateral: Secondary | ICD-10-CM | POA: Diagnosis not present

## 2021-09-27 LAB — TROPONIN I (HIGH SENSITIVITY): Troponin I (High Sensitivity): <2 ng/L (ref <18)

## 2021-09-27 MED ORDER — IOHEXOL 350 MG/ML SOLN
75.0000 mL | Freq: Once | INTRAVENOUS | Status: AC | PRN
  Administered 2021-09-27: 75 mL via INTRAVENOUS

## 2021-09-27 NOTE — ED Provider Notes (Signed)
[facility] Provider Note: Lowella Dell, MD, FACEP  CSN: [id number] MRN: [id number] ARRIVAL: 09/26/21 at 2023 ROOM: MH09/MH09   CHIEF COMPLAINT  Chest Pain   HISTORY OF PRESENT ILLNESS  09/27/21 12:13 AM Deara Bober is a 51 y.o. 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  She also has psoriatic arthritis for which she is not currently on a biologic.  She is here with 1 day of precordial chest tightness which she rates as a 5 out of 10.  It is not worse with breathing.  She does not know if it is worse with exertion because exertion is already painful due to her psoriatic arthritis.  She is not having shortness of breath.   Past Medical History:  Diagnosis Date   Chronic pain syndrome 05/25/2016   Gastroesophageal reflux disease 03/24/2016   History of DVT (deep vein thrombosis) 03/27/2016   History of pulmonary embolism 03/27/2016   IBS (irritable bowel syndrome)    Microscopic colitis    Migraine    Morbid obesity (HCC) 05/27/2016   Psoriatic arthritis (HCC)     Past Surgical History:  Procedure Laterality Date   COLONOSCOPY W/ BIOPSIES     ESOPHAGOGASTRODUODENOSCOPY      Family History  Problem Relation Age of Onset   Hypertension Mother    Hyperlipidemia Mother    Diabetes Mother    Dementia Father     Social History   Tobacco Use   Smoking status: Never   Smokeless tobacco: Never  Substance Use Topics   Alcohol use: No   Drug use: No    Prior to Admission medications   Medication Sig Start Date End Date Taking? Authorizing Provider  albuterol (VENTOLIN HFA) 108 (90 Base) MCG/ACT inhaler Inhale 2 puffs into the lungs every 6 (six) hours as needed for wheezing or shortness of breath. 02/26/20   Breeback, Lonna Cobb, PA-C  AMBULATORY NON FORMULARY  MEDICATION Tens unit to use for muscle atrophy, weakness, pain, muscle spasms 05/09/18   Breeback, Jade L, PA-C  AMBULATORY NON FORMULARY MEDICATION Power wheelchair due to not being able to ambulate. 01/01/21   Jomarie Longs, PA-C  apixaban (ELIQUIS) 5 MG TABS tablet Take 1 tablet by mouth twice daily 04/07/21   Breeback, Jade L, PA-C  buPROPion (WELLBUTRIN XL) 300 MG 24 hr tablet Take 1 tablet (300 mg total) by mouth daily. 01/01/21   Breeback, Lonna Cobb, PA-C  busPIRone (BUSPAR) 15 MG tablet Take 1 tablet by mouth twice daily 07/14/21   Breeback, Jade L, PA-C  butalbital-acetaminophen-caffeine (FIORICET) 50-325-40 MG tablet TAKE ONE TABLET BY MOUTH EVERY 6 HOURS AS NEEDED FOR HEADACHE 05/28/21   Breeback, Jade L, PA-C  cholecalciferol (VITAMIN D3) 25 MCG (1000 UT) tablet Take 1,000 Units by mouth daily.    [provider]  cyclobenzaprine (FLEXERIL) 10 MG tablet TAKE 1 TABLET(10 MG) BY MOUTH THREE TIMES DAILY AS NEEDED FOR MUSCLE SPASMS 01/01/21   Breeback, Jade L, PA-C  diclofenac Sodium (VOLTAREN) 1 % GEL APPLY 4 GRAMS TOPICALLY FOUR TIMES DAILY AS NEEDED 08/20/21   Breeback, Jade L, PA-C  dicyclomine (BENTYL) 20 MG tablet TAKE 1/2 (ONE-HALF) TABLET BY MOUTH THREE TIMES DAILY BEFORE MEAL(S) 06/16/21   Breeback, Jade L, PA-C  divalproex (DEPAKOTE) 250 MG DR tablet Take 1 tablet (  250 mg total) by mouth 2 (two) times daily. 01/01/21   Breeback, Jade L, PA-C  famotidine (PEPCID) 40 MG tablet TAKE 1 TABLET(40 MG) BY MOUTH TWICE DAILY 01/01/21   Breeback, Jade L, PA-C  fluconazole (DIFLUCAN) 150 MG tablet Take 1 tablet (150 mg total) by mouth daily. Repeat in 48-72 hours if symptoms persist. 08/21/21   Breeback, Jade L, PA-C  fluticasone (FLONASE) 50 MCG/ACT nasal spray One spray in each nostril twice a day, use left hand for right nostril, and right hand for left nostril. 01/01/21   Breeback, Jade L, PA-C  gabapentin (NEURONTIN) 600 MG tablet TAKE 1 TABLET BY MOUTH THREE TIMES DAILY 09/22/21   Breeback, Jade  L, PA-C  HYDROcodone-acetaminophen (NORCO) 10-325 MG tablet Take 1 tablet by mouth every 12 (twelve) hours as needed. 10/06/21 11/05/21  Jomarie Longs, PA-C  HYDROcodone-acetaminophen (NORCO) 10-325 MG tablet Take 1 tablet by mouth every 12 (twelve) hours as needed. 09/05/21 10/05/21  Breeback, Lonna Cobb, PA-C  hydrOXYzine (ATARAX/VISTARIL) 50 MG tablet Take 1 tablet (50 mg total) by mouth at bedtime as needed for itching (may repeat once as needed). 01/01/21   Breeback, Jade L, PA-C  levocetirizine (XYZAL) 5 MG tablet TAKE 1 TABLET(5 MG) BY MOUTH EVERY EVENING 01/01/21   Breeback, Jade L, PA-C  methylphenidate (CONCERTA) 36 MG PO CR tablet Take 1 tablet (36 mg total) by mouth daily. 08/21/21   Breeback, Jade L, PA-C  MULTIPLE VITAMIN PO Take by mouth daily.    [provider]  nitrofurantoin, macrocrystal-monohydrate, (MACROBID) 100 MG capsule Take 1 capsule (100 mg total) by mouth 2 (two) times daily. 08/21/21   Breeback, Jade L, PA-C  ondansetron (ZOFRAN-ODT) 8 MG disintegrating tablet Take 1 tablet (8 mg total) by mouth every 8 (eight) hours as needed for nausea or vomiting. 01/01/21   Breeback, Jade L, PA-C  oxybutynin (DITROPAN-XL) 10 MG 24 hr tablet TAKE 1 TABLET(10 MG) BY MOUTH AT BEDTIME 02/10/21   Breeback, Jade L, PA-C  QUEtiapine (SEROQUEL) 50 MG tablet Take 1 tablet (50 mg total) by mouth at bedtime. 08/21/21   Breeback, Lonna Cobb, PA-C  RABEprazole (ACIPHEX) 20 MG tablet Take 1 tablet (20 mg total) by mouth 2 (two) times daily. 08/04/21   Breeback, Jade L, PA-C  Rimegepant Sulfate (NURTEC) 75 MG TBDP Take 1 tablet by mouth as needed. 04/01/21   Breeback, Jade L, PA-C  sucralfate (CARAFATE) 1 g tablet TAKE 1 TABLET BY MOUTH 4 TIMES DAILY (WITH MEALS AND AT BEDTIME) 09/22/21   Breeback, Jade L, PA-C  vortioxetine HBr (TRINTELLIX) 10 MG TABS tablet Take 1 tablet (10 mg total) by mouth daily. 07/01/21   Breeback, Jade L, PA-C  vortioxetine HBr (TRINTELLIX) 20 MG TABS tablet Take 1 tablet (20 mg total)  by mouth daily. 08/21/21   Breeback, Lesly Rubenstein L, PA-C    Allergies Imitrex [sumatriptan], Methotrexate derivatives, Moxifloxacin hcl in nacl, Nsaids, Topamax [topiramate], Remeron [mirtazapine], Clarithromycin, Penicillins, Sulfa antibiotics, and Sumatriptan succinate   REVIEW OF SYSTEMS  Negative except as noted here or in the History of Present Illness.   PHYSICAL EXAMINATION  Initial Vital Signs Blood pressure 108/79, pulse 90, temperature 98.1 F (36.7 C), temperature source Oral, resp. rate (!) 24, height 5\' 4"  (1.626 m), weight (!) 137.9 kg, SpO2 100 %.  Examination General: Well-developed, high BMI female in no acute distress; appearance consistent with age of record HENT: normocephalic; atraumatic Eyes: Normal appearance Neck: supple Heart: regular rate and rhythm Lungs: clear to auscultation bilaterally  Abdomen: soft; nondistended; nontender; bowel sounds present Extremities: No deformity; full range of motion; pulses normal Neurologic: Awake, alert and oriented; motor function intact in all extremities and symmetric; no facial droop Skin: Warm and dry; intertrigonal rash of right popliteal fossa Psychiatric: Normal mood and affect   RESULTS  Summary of this visit's results, reviewed and interpreted by myself:   EKG Interpretation  Date/Time:  Friday September 26 2021 20:44:17 EDT Ventricular Rate:  100 PR Interval:  142 QRS Duration: 80 QT Interval:  332 QTC Calculation: 428 R Axis:   55 Text Interpretation: Normal sinus rhythm No previous ECGs available Confirmed by Paula Libra ([id number]) on 09/27/2021 12:14:07 AM       Laboratory Studies: Results for orders placed or performed during the hospital encounter of 09/26/21 (from the past 24 hour(s))  Basic metabolic panel     Status: Abnormal   Collection Time: 09/26/21  8:47 PM  Result Value Ref Range   Sodium 136 135 - 145 mmol/L   Potassium 3.9 3.5 - 5.1 mmol/L   Chloride 105 98 - 111 mmol/L   CO2 24 22 - 32  mmol/L   Glucose, Bld 118 (H) 70 - 99 mg/dL   BUN 10 6 - 20 mg/dL   Creatinine, Ser 0.35 0.44 - 1.00 mg/dL   Calcium 9.7 8.9 - 00.9 mg/dL   GFR, Estimated >38 >18 mL/min   Anion gap 7 5 - 15  CBC     Status: Abnormal   Collection Time: 09/26/21  8:47 PM  Result Value Ref Range   WBC 9.5 4.0 - 10.5 K/uL   RBC 5.27 (H) 3.87 - 5.11 MIL/uL   Hemoglobin 13.1 12.0 - 15.0 g/dL   HCT 29.9 37.1 - 69.6 %   MCV 80.8 80.0 - 100.0 fL   MCH 24.9 (L) 26.0 - 34.0 pg   MCHC 30.8 30.0 - 36.0 g/dL   RDW 78.9 38.1 - 01.7 %   Platelets 507 (H) 150 - 400 K/uL   nRBC 0.0 0.0 - 0.2 %  Troponin I (High Sensitivity)     Status: None   Collection Time: 09/26/21  8:47 PM  Result Value Ref Range   Troponin I (High Sensitivity) 2 <18 ng/L  hCG, serum, qualitative     Status: None   Collection Time: 09/26/21  8:47 PM  Result Value Ref Range   Preg, Serum NEGATIVE NEGATIVE  Troponin I (High Sensitivity)     Status: None   Collection Time: 09/27/21 12:03 AM  Result Value Ref Range   Troponin I (High Sensitivity) <2 <18 ng/L   Imaging Studies: CT Angio Chest PE W and/or Wo Contrast  Result Date: 09/27/2021 CLINICAL DATA:  Pulmonary embolism (PE) suspected, high prob Chest tightness. History of pulmonary embolus. Not on anticoagulation. EXAM: CT ANGIOGRAPHY CHEST WITH CONTRAST TECHNIQUE: Multidetector CT imaging of the chest was performed using the standard protocol during bolus administration of intravenous contrast. Multiplanar CT image reconstructions and MIPs were obtained to evaluate the vascular anatomy. RADIATION DOSE REDUCTION: This exam was performed according to the departmental dose-optimization program which includes automated exposure control, adjustment of the mA and/or kV according to patient size and/or use of iterative reconstruction technique. CONTRAST:  77mL OMNIPAQUE IOHEXOL 350 MG/ML SOLN, patient received 2 contrast injections due to first scan with IV leaking during the exam. COMPARISON:   Radiograph yesterday.  Most recent chest CTA 06/25/2019 FINDINGS: Cardiovascular: There are no filling defects within the pulmonary arteries to suggest pulmonary embolus. Previous  right lower lobe pulmonary emboli have resolved. The thoracic aorta is normal in caliber. No aortic dissection. Heart is normal in size. No pericardial effusion. Mediastinum/Nodes: Small hiatal hernia. No mediastinal or hilar adenopathy. No mediastinal mass Lungs/Pleura: Minimal scarring in the left upper lobe at site of prior pulmonary opacity. No acute airspace disease. No pleural effusion. No features of pulmonary edema. The trachea and central bronchi are patent. Upper Abdomen: Stable size and appearance of 2.6 cm indeterminate lesion in the upper left kidney. This does not measure simple fluid density. No acute upper abdominal findings. Musculoskeletal: Thoracic spondylosis. Stable scattered vertebral body hemangiomas. No suspicious bone lesion or acute osseous findings. Peripherally calcified hyperdense lesion in the left breast is stable. Review of the MIP images confirms the above findings. IMPRESSION: 1. No pulmonary embolus or acute intrathoracic abnormality. Previous right lower lobe pulmonary emboli have resolved. 2. No acute intrathoracic abnormality. 3. Stable size and appearance of indeterminate 2.6 cm lesion in the upper left kidney. Fall size stability over the last 2 years is reassuring, recommend nonemergent renal protocol MRI for characterization. 4. Small hiatal hernia. Electronically Signed   By: Narda Rutherford M.D.   On: 09/27/2021 01:26   DG Chest 2 View  Result Date: 09/26/2021 CLINICAL DATA:  Chest pain EXAM: CHEST - 2 VIEW COMPARISON:  04/22/2020 FINDINGS: The heart size and mediastinal contours are within normal limits. Both lungs are clear. The visualized skeletal structures are unremarkable. IMPRESSION: No active cardiopulmonary disease. Electronically Signed   By: Deatra Robinson M.D.   On: 09/26/2021  21:26    ED COURSE and MDM  Nursing notes, initial and subsequent vitals signs, including pulse oximetry, reviewed and interpreted by myself.  Vitals:   09/26/21 2303 09/27/21 0000 09/27/21 0033 09/27/21 0112  BP: 103/78 108/79  114/89  Pulse: 95 90  81  Resp: 15 (!) 24  18  Temp:   97.6 F (36.4 C)   TempSrc:   Oral   SpO2: 100% 100%  100%  Weight:      Height:       Medications  iohexol (OMNIPAQUE) 350 MG/ML injection 75 mL (75 mLs Intravenous Contrast Given 09/27/21 0110)   1:33 AM No evidence of pulmonary embolism.  Her HEART score is 2 (low risk).  I do not believe admission or additional testing are indicated at this time.  She is concerned about recurrent DVT and would like [place] to schedule outpatient Dopplers of her lower extremities.  The intertrigonal rash of her right popliteal fossa is concerning for a fungal infection.  She has a history of a similar infection of her feet in 2019 but does not recall the name of the antifungal which was successful.  She was advised to use over-the-counter Lamisil and to follow-up with her PCP if she does not get improvement.   PROCEDURES  Procedures   ED DIAGNOSES     ICD-10-CM   1. Atypical chest pain  R07.89     2. Intertrigo  L30.4          Juergen Hardenbrook, MD 09/27/21 [phone]

## 2021-09-27 NOTE — ED Provider Notes (Signed)
Patient was seen yesterday with concern for DVT.  She came back this morning for DVT study.  This was viewed and interpreted by me.  No signs of DVT.  I spoke with her and her husband about the results and they are thankful for their care.  No further questions and discharged with return precautions.   Saddie Benders, PA-C 09/27/21 1343    Terrilee Files, MD 09/27/21 [phone]

## 2021-09-27 NOTE — ED Notes (Signed)
ED Provider at bedside. 

## 2021-09-27 NOTE — ED Notes (Signed)
Patient transported to CT via stretcher at this time.  

## 2021-09-30 ENCOUNTER — Ambulatory Visit: Payer: 59 | Admitting: Sports Medicine

## 2021-10-05 ENCOUNTER — Other Ambulatory Visit: Payer: Self-pay | Admitting: Physician Assistant

## 2021-10-06 MED ORDER — PAROXETINE HCL 10 MG PO TABS
ORAL_TABLET | ORAL | 0 refills | 30.00000 days
Start: 2021-10-06 — End: ?

## 2021-10-13 MED ORDER — PAROXETINE HCL 10 MG PO TABS
ORAL_TABLET | 0 refills | Status: AC
Start: 2021-10-13 — End: ?

## 2021-10-17 ENCOUNTER — Encounter: Payer: Self-pay | Admitting: Physician Assistant

## 2021-10-17 ENCOUNTER — Ambulatory Visit (INDEPENDENT_AMBULATORY_CARE_PROVIDER_SITE_OTHER): Payer: 59 | Admitting: Physician Assistant

## 2021-10-17 VITALS — BP 112/78 | HR 84

## 2021-10-17 DIAGNOSIS — K5900 Constipation, unspecified: Secondary | ICD-10-CM

## 2021-10-17 DIAGNOSIS — G9332 Myalgic encephalomyelitis/chronic fatigue syndrome: Secondary | ICD-10-CM

## 2021-10-17 DIAGNOSIS — K219 Gastro-esophageal reflux disease without esophagitis: Secondary | ICD-10-CM

## 2021-10-17 DIAGNOSIS — K2270 Barrett's esophagus without dysplasia: Secondary | ICD-10-CM | POA: Diagnosis not present

## 2021-10-17 DIAGNOSIS — L4050 Arthropathic psoriasis, unspecified: Secondary | ICD-10-CM

## 2021-10-17 DIAGNOSIS — Z23 Encounter for immunization: Secondary | ICD-10-CM

## 2021-10-17 DIAGNOSIS — R69 Illness, unspecified: Secondary | ICD-10-CM | POA: Diagnosis not present

## 2021-10-17 DIAGNOSIS — G894 Chronic pain syndrome: Secondary | ICD-10-CM | POA: Diagnosis not present

## 2021-10-17 DIAGNOSIS — R253 Fasciculation: Secondary | ICD-10-CM

## 2021-10-17 DIAGNOSIS — J302 Other seasonal allergic rhinitis: Secondary | ICD-10-CM | POA: Diagnosis not present

## 2021-10-17 DIAGNOSIS — R0789 Other chest pain: Secondary | ICD-10-CM

## 2021-10-17 DIAGNOSIS — K52831 Collagenous colitis: Secondary | ICD-10-CM

## 2021-10-17 DIAGNOSIS — L304 Erythema intertrigo: Secondary | ICD-10-CM | POA: Diagnosis not present

## 2021-10-17 DIAGNOSIS — G43011 Migraine without aura, intractable, with status migrainosus: Secondary | ICD-10-CM

## 2021-10-17 DIAGNOSIS — E039 Hypothyroidism, unspecified: Secondary | ICD-10-CM

## 2021-10-17 DIAGNOSIS — F329 Major depressive disorder, single episode, unspecified: Secondary | ICD-10-CM

## 2021-10-17 DIAGNOSIS — R4789 Other speech disturbances: Secondary | ICD-10-CM

## 2021-10-17 DIAGNOSIS — G454 Transient global amnesia: Secondary | ICD-10-CM

## 2021-10-17 MED ORDER — MONTELUKAST SODIUM 10 MG PO TABS: 10.0000 mg | ORAL_TABLET | Freq: Every day | ORAL | 3 refills | Status: DC

## 2021-10-17 MED ORDER — DOXYCYCLINE HYCLATE 100 MG PO TABS: 100.0000 mg | ORAL_TABLET | Freq: Two times a day (BID) | ORAL | 0 refills | Status: DC

## 2021-10-17 MED ORDER — LINACLOTIDE 145 MCG PO CAPS: 145.0000 ug | ORAL_CAPSULE | Freq: Every day | ORAL | 1 refills | Status: DC

## 2021-10-17 MED ORDER — PREDNISONE 50 MG PO TABS: ORAL_TABLET | ORAL | 0 refills | Status: DC

## 2021-10-17 MED ORDER — KETOCONAZOLE 2 % EX CREA: 1.0000 | TOPICAL_CREAM | Freq: Two times a day (BID) | CUTANEOUS | 0 refills | Status: DC

## 2021-10-17 MED ORDER — RABEPRAZOLE SODIUM 20 MG PO TBEC: 20.0000 mg | DELAYED_RELEASE_TABLET | Freq: Two times a day (BID) | ORAL | 3 refills | Status: DC

## 2021-10-17 MED ORDER — FLUCONAZOLE 150 MG PO TABS: ORAL_TABLET | ORAL | 0 refills | Status: DC

## 2021-10-17 NOTE — Patient Instructions (Signed)
Will make referral to neurology, gastroenterology, rheumatology.   Interdry    Intertrigo Intertrigo is skin irritation or inflammation (dermatitis) that occurs when folds of skin rub together. The irritation can cause a rash and make skin raw and itchy. This condition most commonly occurs in the skin folds of these areas: Toes. Armpits. Groin. Under the belly. Under the breasts. Buttocks. Intertrigo is not passed from person to person (is not contagious). What are the causes? This condition is caused by heat, moisture, rubbing (friction), and not enough air circulation. The condition can be made worse by: Sweat. Bacteria. A fungus, such as yeast. What increases the risk? This condition is more likely to occur if you have moisture in your skin folds. You are more likely to develop this condition if you: Have diabetes. Are overweight. Are not able to move around or are not active. Live in a warm and moist climate. Wear splints, braces, or other medical devices. Are not able to control your bowels or bladder (have incontinence). What are the signs or symptoms? Symptoms of this condition include: A pink or red skin rash in the skin fold or near the skin fold. Raw or scaly skin. Itchiness. A burning feeling. Bleeding. Leaking fluid. A bad smell. How is this diagnosed? This condition is diagnosed with a medical history and physical exam. You may also have a skin swab to test for bacteria or a fungus. How is this treated? This condition may be treated by: Cleaning and drying your skin. Taking an antibiotic medicine or using an antibiotic skin cream for a bacterial infection. Using an antifungal cream on your skin or taking pills for an infection that was caused by a fungus, such as yeast. Using a steroid ointment to relieve itchiness and irritation. Separating the skin fold with a clean cotton cloth to absorb moisture and allow air to flow into the area. Follow these  instructions at home: Keep the affected area clean and dry. Do not scratch your skin. Stay in a cool environment as much as possible. Use an air conditioner or fan, if available. Apply over-the-counter and prescription medicines only as told by your health care provider. If you were prescribed an antibiotic medicine, use it as told by your health care provider. Do not stop using the antibiotic even if your condition improves. Keep all follow-up visits as told by your health care provider. This is important. How is this prevented?  Maintain a healthy weight. Take care of your feet, especially if you have diabetes. Foot care includes: Wearing shoes that fit well. Keeping your feet dry. Wearing clean, breathable socks. Protect the skin around your groin and buttocks, especially if you have incontinence. Skin protection includes: Following a regular cleaning routine. Using skin protectant creams, powders, or ointments. Changing protection pads frequently. Do not wear tight clothes. Wear clothes that are loose, absorbent, and made of cotton. Wear a bra that gives good support, if needed. Shower and dry yourself well after activity or exercise. Use a hair dryer on a cool setting to dry between skin folds, especially after you bathe. If you have diabetes, keep your blood sugar under control. Contact a health care provider if: Your symptoms do not improve with treatment. Your symptoms get worse or they spread. You notice increased redness and warmth. You have a fever. Summary Intertrigo is skin irritation or inflammation (dermatitis) that occurs when folds of skin rub together. This condition is caused by heat, moisture, rubbing (friction), and not enough air circulation. This  condition may be treated by cleaning and drying your skin and with medicines. Apply over-the-counter and prescription medicines only as told by your health care provider. Keep all follow-up visits as told by your health  care provider. This is important. This information is not intended to replace advice given to you by your health care provider. Make sure you discuss any questions you have with your health care provider. Document Revised: 04/23/2021 Document Reviewed: 11/25/2020 Elsevier Patient Education  2023 [employer].

## 2021-10-17 NOTE — Progress Notes (Unsigned)
sh

## 2021-10-21 ENCOUNTER — Other Ambulatory Visit: Payer: Self-pay | Admitting: Physician Assistant

## 2021-10-21 ENCOUNTER — Encounter: Payer: Self-pay | Admitting: Physician Assistant

## 2021-10-21 DIAGNOSIS — R809 Proteinuria, unspecified: Secondary | ICD-10-CM

## 2021-10-21 DIAGNOSIS — R350 Frequency of micturition: Secondary | ICD-10-CM

## 2021-10-21 DIAGNOSIS — R0789 Other chest pain: Secondary | ICD-10-CM | POA: Insufficient documentation

## 2021-10-21 DIAGNOSIS — R82998 Other abnormal findings in urine: Secondary | ICD-10-CM

## 2021-10-21 DIAGNOSIS — K5900 Constipation, unspecified: Secondary | ICD-10-CM | POA: Insufficient documentation

## 2021-10-21 DIAGNOSIS — R339 Retention of urine, unspecified: Secondary | ICD-10-CM

## 2021-10-21 DIAGNOSIS — K2100 Gastro-esophageal reflux disease with esophagitis, without bleeding: Secondary | ICD-10-CM

## 2021-10-21 NOTE — Progress Notes (Signed)
Established Patient Office Visit  Subjective   Patient ID: Sarah Mayer, female    DOB: Jun 29, 1970  Age: 51 y.o. MRN: [id number]  No chief complaint on file.   HPI Pt is a 51 yo obese female with Migraines, OSA, GERD and Barrett's esophagus, Psoratic Arthritis, hypothyroidism, CFS who presents to the clinic for follow up.   She just switched insurances and needs referrals for rheumatology/GI/neurology.   Has itching and painful rash behind right knee that continues to get worse over the past 2 weeks. Putting anti-fungal on it.       ROS   See HPI.  Objective:     BP 112/78   Pulse 84   SpO2 99%  BP Readings from Last 3 Encounters:  10/17/21 112/78  09/27/21 114/89  08/21/21 128/85   Wt Readings from Last 3 Encounters:  09/26/21 (!) 304 lb (137.9 kg)  01/31/21 (!) 317 lb (143.8 kg)  10/18/19 (!) 365 lb (165.6 kg)      Physical Exam Vitals reviewed.  Constitutional:      Appearance: Normal appearance. She is obese.  HENT:     Head: Normocephalic.  Cardiovascular:     Rate and Rhythm: Normal rate and regular rhythm.     Pulses: Normal pulses.     Heart sounds: Normal heart sounds.  Pulmonary:     Effort: Pulmonary effort is normal.     Breath sounds: Normal breath sounds.  Skin:    Comments: Posterior right knee moist erythematous rash with central pustule and satellite lesions  Neurological:     General: No focal deficit present.     Mental Status: She is alert and oriented to person, place, and time.  Psychiatric:        Mood and Affect: Mood normal.         Assessment & Plan:  Marland KitchenMarland KitchenDiagnoses and all orders for this visit:  Psoriatic arthritis (HCC) -     DME Wheelchair manual  Sternum pain -     predniSONE (DELTASONE) 50 MG tablet; Take one tablet for 5 days.  CFS (chronic fatigue syndrome)  Chronic pain syndrome  Major depressive disorder with current active episode, unspecified depression episode severity, unspecified whether  recurrent  Acquired hypothyroidism  Constipation, unspecified constipation type -     linaclotide (LINZESS) 145 MCG CAPS capsule; Take 1 capsule (145 mcg total) by mouth daily before breakfast.  Gastroesophageal reflux disease without esophagitis -     RABEprazole (ACIPHEX) 20 MG tablet; Take 1 tablet (20 mg total) by mouth 2 (two) times daily.  Intertrigo -     doxycycline (VIBRA-TABS) 100 MG tablet; Take 1 tablet (100 mg total) by mouth 2 (two) times daily. -     ketoconazole (NIZORAL) 2 % cream; Apply 1 Application topically 2 (two) times daily. To affected areas. -     fluconazole (DIFLUCAN) 150 MG tablet; Take one tablet every 3 days x 4.  Need for shingles vaccine -     Zoster Recombinant (Shingrix )  Need for Tdap vaccination -     Tdap vaccine greater than or equal to 7yo IM  Need for influenza vaccination -     Flu Vaccine QUAD 49mo+IM (Fluarix, Fluzone & Alfiuria Quad PF)  Seasonal allergies -     montelukast (SINGULAIR) 10 MG tablet; Take 1 tablet (10 mg total) by mouth at bedtime.   Vitals look great Some sternal pain-declined CXR Suspect some costochondritis Prednisone burst given and also could help with some psoriatic  flare Referral made to rheumatology Mobility worsening referral for wheelchair made Needs new referral to GI for GERD and barretts-refilled aciphex Needs new referral to neurology for brain fog and memory issues and word finding issues. Pt requested referral.   Vaccines given today to get up to date for recommendations.   Looks like infected fungal infection behind right knee Sent diflucan Sent doxycycline Sent topical anti-fungal cream Use interdry to keep dry behind knee during the hot weather  Follow up in 4weeks.    [name], PA-C

## 2021-10-22 ENCOUNTER — Other Ambulatory Visit: Payer: Self-pay | Admitting: Physician Assistant

## 2021-10-22 DIAGNOSIS — G894 Chronic pain syndrome: Secondary | ICD-10-CM

## 2021-10-22 DIAGNOSIS — M7918 Myalgia, other site: Secondary | ICD-10-CM

## 2021-10-26 ENCOUNTER — Other Ambulatory Visit: Payer: Self-pay | Admitting: Physician Assistant

## 2021-10-26 DIAGNOSIS — M7918 Myalgia, other site: Secondary | ICD-10-CM

## 2021-10-29 ENCOUNTER — Other Ambulatory Visit: Payer: Self-pay | Admitting: Neurology

## 2021-10-29 DIAGNOSIS — L4050 Arthropathic psoriasis, unspecified: Secondary | ICD-10-CM

## 2021-10-29 DIAGNOSIS — G894 Chronic pain syndrome: Secondary | ICD-10-CM

## 2021-10-29 MED ORDER — HYDROCODONE-ACETAMINOPHEN 10-325 MG PO TABS: 1.0000 | ORAL_TABLET | Freq: Two times a day (BID) | ORAL | 0 refills | Status: DC | PRN

## 2021-10-29 NOTE — Telephone Encounter (Signed)
Patient's husband called and LVM asking for refill of pain medication. Last written 10/06/2021 #60 no refills. Last appt 10/17/2021.

## 2021-11-04 ENCOUNTER — Encounter: Payer: Self-pay | Admitting: Physician Assistant

## 2021-11-13 ENCOUNTER — Other Ambulatory Visit: Payer: Self-pay | Admitting: Physician Assistant

## 2021-11-13 DIAGNOSIS — M7918 Myalgia, other site: Secondary | ICD-10-CM

## 2021-11-13 DIAGNOSIS — L304 Erythema intertrigo: Secondary | ICD-10-CM

## 2021-11-21 ENCOUNTER — Ambulatory Visit: Payer: 59 | Admitting: Physician Assistant

## 2021-11-25 ENCOUNTER — Ambulatory Visit: Payer: 59 | Admitting: Physician Assistant

## 2021-11-27 ENCOUNTER — Ambulatory Visit: Payer: 59 | Admitting: Physician Assistant

## 2021-11-28 ENCOUNTER — Other Ambulatory Visit: Payer: Self-pay | Admitting: Neurology

## 2021-11-28 DIAGNOSIS — L4050 Arthropathic psoriasis, unspecified: Secondary | ICD-10-CM

## 2021-11-28 DIAGNOSIS — F329 Major depressive disorder, single episode, unspecified: Secondary | ICD-10-CM

## 2021-11-28 DIAGNOSIS — G894 Chronic pain syndrome: Secondary | ICD-10-CM

## 2021-11-28 MED ORDER — BUPROPION HCL ER (XL) 300 MG PO TB24: 300.0000 mg | ORAL_TABLET | Freq: Every day | ORAL | 0 refills | Status: DC

## 2021-11-28 NOTE — Telephone Encounter (Signed)
On pain contract Last written 10/29/2021 for one month Last appt 10/17/2021

## 2021-12-01 ENCOUNTER — Other Ambulatory Visit: Payer: Self-pay | Admitting: Physician Assistant

## 2021-12-01 DIAGNOSIS — L304 Erythema intertrigo: Secondary | ICD-10-CM

## 2021-12-01 MED ORDER — HYDROCODONE-ACETAMINOPHEN 10-325 MG PO TABS: 1.0000 | ORAL_TABLET | Freq: Two times a day (BID) | ORAL | 0 refills | Status: AC | PRN

## 2021-12-01 NOTE — Telephone Encounter (Signed)
Routing to covering provider for zolpidem.   Last OV: 10/17/21 Next OV: 12/08/21 Last RF: 09/23/21

## 2021-12-07 ENCOUNTER — Other Ambulatory Visit: Payer: Self-pay | Admitting: Physician Assistant

## 2021-12-07 DIAGNOSIS — M7918 Myalgia, other site: Secondary | ICD-10-CM

## 2021-12-08 ENCOUNTER — Ambulatory Visit (INDEPENDENT_AMBULATORY_CARE_PROVIDER_SITE_OTHER): Payer: 59 | Admitting: Physician Assistant

## 2021-12-08 VITALS — BP 128/86 | HR 100 | Temp 98.0°F

## 2021-12-08 DIAGNOSIS — M79645 Pain in left finger(s): Secondary | ICD-10-CM

## 2021-12-08 DIAGNOSIS — L089 Local infection of the skin and subcutaneous tissue, unspecified: Secondary | ICD-10-CM | POA: Diagnosis not present

## 2021-12-08 DIAGNOSIS — R300 Dysuria: Secondary | ICD-10-CM | POA: Diagnosis not present

## 2021-12-08 DIAGNOSIS — M25572 Pain in left ankle and joints of left foot: Secondary | ICD-10-CM | POA: Diagnosis not present

## 2021-12-08 DIAGNOSIS — G9332 Myalgic encephalomyelitis/chronic fatigue syndrome: Secondary | ICD-10-CM

## 2021-12-08 DIAGNOSIS — F5101 Primary insomnia: Secondary | ICD-10-CM

## 2021-12-08 DIAGNOSIS — L4050 Arthropathic psoriasis, unspecified: Secondary | ICD-10-CM

## 2021-12-08 DIAGNOSIS — M7918 Myalgia, other site: Secondary | ICD-10-CM | POA: Diagnosis not present

## 2021-12-08 DIAGNOSIS — G8929 Other chronic pain: Secondary | ICD-10-CM

## 2021-12-08 LAB — POCT URINALYSIS DIP (CLINITEK)
Bilirubin, UA: NEGATIVE
Blood, UA: NEGATIVE
Glucose, UA: NEGATIVE mg/dL
Nitrite, UA: POSITIVE — AB
POC PROTEIN,UA: 30 — AB
Spec Grav, UA: 1.025 (ref 1.010–1.025)
Urobilinogen, UA: 4 E.U./dL — AB
pH, UA: 6 (ref 5.0–8.0)

## 2021-12-08 MED ORDER — DICLOFENAC SODIUM 1 % EX GEL: CUTANEOUS | 3 refills | Status: DC

## 2021-12-08 MED ORDER — FLUCONAZOLE 150 MG PO TABS: ORAL_TABLET | ORAL | 0 refills | Status: DC

## 2021-12-08 MED ORDER — CYCLOBENZAPRINE HCL 10 MG PO TABS: ORAL_TABLET | ORAL | 0 refills | Status: DC

## 2021-12-08 MED ORDER — LEVOFLOXACIN 500 MG PO TABS: 500.0000 mg | ORAL_TABLET | Freq: Every day | ORAL | 0 refills | Status: AC

## 2021-12-08 MED ORDER — ZOLPIDEM TARTRATE 10 MG PO TABS: 10.0000 mg | ORAL_TABLET | Freq: Every evening | ORAL | 1 refills | Status: DC | PRN

## 2021-12-08 NOTE — Patient Instructions (Addendum)
PT aquatic ordered Diflucan for 4 weeks once a week Levaquin for 7 days.

## 2021-12-08 NOTE — Progress Notes (Signed)
UA positive for UTI. Levaquin was sent to pharmacy already to start!

## 2021-12-09 DIAGNOSIS — M1990 Unspecified osteoarthritis, unspecified site: Secondary | ICD-10-CM | POA: Diagnosis not present

## 2021-12-09 DIAGNOSIS — L4050 Arthropathic psoriasis, unspecified: Secondary | ICD-10-CM | POA: Diagnosis not present

## 2021-12-09 DIAGNOSIS — Z79620 Long term (current) use of immunosuppressive biologic: Secondary | ICD-10-CM | POA: Diagnosis not present

## 2021-12-09 DIAGNOSIS — L409 Psoriasis, unspecified: Secondary | ICD-10-CM | POA: Diagnosis not present

## 2021-12-09 DIAGNOSIS — Z79899 Other long term (current) drug therapy: Secondary | ICD-10-CM | POA: Diagnosis not present

## 2021-12-11 LAB — URINE CULTURE
MICRO NUMBER:: 14060896
SPECIMEN QUALITY:: ADEQUATE

## 2021-12-12 NOTE — Progress Notes (Signed)
Levaquin which was given should treat and patient should be feeling better.

## 2021-12-14 ENCOUNTER — Encounter: Payer: Self-pay | Admitting: Physician Assistant

## 2021-12-16 ENCOUNTER — Encounter: Payer: Self-pay | Admitting: Physician Assistant

## 2021-12-16 MED ORDER — LUBIPROSTONE 24 MCG PO CAPS: 24.0000 ug | ORAL_CAPSULE | Freq: Two times a day (BID) | ORAL | 3 refills | Status: DC

## 2021-12-17 ENCOUNTER — Encounter: Payer: Self-pay | Admitting: Physician Assistant

## 2021-12-17 ENCOUNTER — Other Ambulatory Visit: Payer: Self-pay | Admitting: Physician Assistant

## 2021-12-17 DIAGNOSIS — Z86711 Personal history of pulmonary embolism: Secondary | ICD-10-CM

## 2021-12-18 ENCOUNTER — Telehealth: Payer: Self-pay

## 2021-12-18 NOTE — Telephone Encounter (Signed)
Initiated Prior authorization [id number] 24MCG capsules Via: Covermymeds Case/Key:[id number] Status: approved  as of 12/18/2021 Reason:this authorization is good from 12/18/21-12/18/24 Notified Pt via: Mychart

## 2021-12-19 ENCOUNTER — Encounter: Payer: Self-pay | Admitting: Physician Assistant

## 2021-12-22 ENCOUNTER — Other Ambulatory Visit: Payer: Self-pay | Admitting: Physician Assistant

## 2021-12-22 MED ORDER — MUPIROCIN 2 % EX OINT: TOPICAL_OINTMENT | CUTANEOUS | 2 refills | Status: DC

## 2021-12-22 NOTE — Progress Notes (Signed)
Bactroban sent for superficial skin wound

## 2021-12-25 ENCOUNTER — Encounter: Payer: Self-pay | Admitting: Physician Assistant

## 2021-12-26 ENCOUNTER — Encounter: Payer: Self-pay | Admitting: Physician Assistant

## 2021-12-26 ENCOUNTER — Other Ambulatory Visit: Payer: Self-pay | Admitting: Family Medicine

## 2021-12-26 DIAGNOSIS — L304 Erythema intertrigo: Secondary | ICD-10-CM

## 2021-12-31 ENCOUNTER — Encounter: Payer: Self-pay | Admitting: Physician Assistant

## 2021-12-31 DIAGNOSIS — R262 Difficulty in walking, not elsewhere classified: Secondary | ICD-10-CM | POA: Diagnosis not present

## 2021-12-31 DIAGNOSIS — L4050 Arthropathic psoriasis, unspecified: Secondary | ICD-10-CM | POA: Diagnosis not present

## 2021-12-31 DIAGNOSIS — M25552 Pain in left hip: Secondary | ICD-10-CM | POA: Diagnosis not present

## 2021-12-31 DIAGNOSIS — R29898 Other symptoms and signs involving the musculoskeletal system: Secondary | ICD-10-CM | POA: Diagnosis not present

## 2021-12-31 DIAGNOSIS — M1710 Unilateral primary osteoarthritis, unspecified knee: Secondary | ICD-10-CM | POA: Diagnosis not present

## 2021-12-31 DIAGNOSIS — M25551 Pain in right hip: Secondary | ICD-10-CM | POA: Diagnosis not present

## 2022-01-05 ENCOUNTER — Ambulatory Visit (INDEPENDENT_AMBULATORY_CARE_PROVIDER_SITE_OTHER): Payer: 59 | Admitting: Physician Assistant

## 2022-01-05 ENCOUNTER — Encounter: Payer: Self-pay | Admitting: Physician Assistant

## 2022-01-05 VITALS — BP 118/76 | HR 102

## 2022-01-05 DIAGNOSIS — K5903 Drug induced constipation: Secondary | ICD-10-CM

## 2022-01-05 DIAGNOSIS — L4050 Arthropathic psoriasis, unspecified: Secondary | ICD-10-CM | POA: Diagnosis not present

## 2022-01-05 DIAGNOSIS — F339 Major depressive disorder, recurrent, unspecified: Secondary | ICD-10-CM

## 2022-01-05 DIAGNOSIS — R110 Nausea: Secondary | ICD-10-CM | POA: Diagnosis not present

## 2022-01-05 DIAGNOSIS — L089 Local infection of the skin and subcutaneous tissue, unspecified: Secondary | ICD-10-CM | POA: Diagnosis not present

## 2022-01-05 DIAGNOSIS — M25561 Pain in right knee: Secondary | ICD-10-CM | POA: Diagnosis not present

## 2022-01-05 DIAGNOSIS — R69 Illness, unspecified: Secondary | ICD-10-CM | POA: Diagnosis not present

## 2022-01-05 MED ORDER — DULOXETINE HCL 20 MG PO CPEP: 20.0000 mg | ORAL_CAPSULE | Freq: Every day | ORAL | 0 refills | Status: DC

## 2022-01-05 MED ORDER — HYDROCODONE-ACETAMINOPHEN 5-325 MG PO TABS: 1.0000 | ORAL_TABLET | Freq: Two times a day (BID) | ORAL | 0 refills | Status: DC

## 2022-01-05 MED ORDER — BUPROPION HCL ER (XL) 150 MG PO TB24: 150.0000 mg | ORAL_TABLET | ORAL | 0 refills | Status: DC

## 2022-01-05 MED ORDER — FLUCONAZOLE 150 MG PO TABS: ORAL_TABLET | ORAL | 0 refills | Status: DC

## 2022-01-05 NOTE — Progress Notes (Signed)
Established Patient Office Visit  Subjective   Patient ID: Sarah Mayer, female    DOB: 1971-02-22  Age: 51 y.o. MRN: [id number]  Chief Complaint  Patient presents with   Follow-up    HPI Pt is a 51 yo obese female who presents to the clinic for 4 week follow up on skin infection behind right knee. It is doing much better. She finished the levaquin and diflucan. Continues to have some papules but much less red and not oozing and not hurting.   She does have some new right knee pain after having PT. The physical therapist made her stand and walk forward and backward and her knee has hurt since. She is scared to go back.   She is becoming more and more weak. Her joints are becoming more and more painful, red, and swollen. She did see a new rheumatologist which seems to listen to her and thinks it is going to be a good fit.   Constipation continues to be a problem. Amitiza is not helping at all.    .. Active Ambulatory Problems    Diagnosis Date Noted   Psoriatic arthritis (HCC) 03/24/2016   Gastroesophageal reflux disease 03/24/2016   GAD (generalized anxiety disorder) 03/24/2016   Iron deficiency anemia 03/24/2016   History of pulmonary embolus (PE) 03/27/2016   History of DVT (deep vein thrombosis) 03/27/2016   Chronic tension-type headache, not intractable 03/27/2016   Primary insomnia 03/27/2016   Bilateral lower extremity edema 03/27/2016   Chronic pain syndrome 05/25/2016   Morbidly obese (HCC) 05/27/2016   Serum calcium elevated 08/30/2016   Diarrhea 08/30/2016   Collagenous colitis 10/18/2016   Barrett's esophagus without dysplasia 10/18/2016   Arthralgia 10/18/2016   Memory changes 11/23/2016   Major depressive disorder with current active episode 11/23/2016   OSA (obstructive sleep apnea) 11/23/2016   Twitching 12/22/2016   Upper airway cough syndrome 12/22/2016   Bilateral hip pain 12/22/2016   Tripping over things 01/12/2017   Left arm pain 01/12/2017    Left elbow pain 01/12/2017   Numbness and tingling in left arm 01/17/2017   Word finding difficulty 03/15/2017   Intractable migraine without aura and with status migrainosus 03/15/2017   Edema of both feet 07/04/2017   B12 deficiency 07/04/2017   Acquired hypothyroidism 07/04/2017   Vitamin D deficiency 07/04/2017   Secondary hyperparathyroidism, non-renal (HCC) 07/04/2017   Psoriasis 07/06/2017   CFS (chronic fatigue syndrome) 07/06/2017   Easy bruising 07/26/2017   Myofascial pain 08/19/2017   Itching 08/19/2017   Yeast infection 08/19/2017   IBS (irritable bowel syndrome) 12/03/2017   Recurrent falls 12/29/2017   Weakness 02/11/2018   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   Urinary frequency 02/14/2018   Chronic left shoulder pain 03/15/2018   Chronic pain of left thumb 03/15/2018   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   Jerking 09/12/2018   Episode of confusion 09/12/2018   Bilateral leg pain 11/14/2018   Dry mouth 11/14/2018   Bilateral leg weakness 01/23/2019   Skin infection 01/23/2019   Chronic nonintractable headache 02/07/2019   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   Intertrigo 08/11/2019   Melasma 08/11/2019   OAB (overactive bladder) 08/11/2019   DDD (degenerative disc disease), lumbar 01/23/2020   Osteoarthritis, hip, bilateral 01/23/2020   Cough, persistent 06/24/2020   COVID-19 long hauler 02/18/2021   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   Hallucinations 07/01/2021   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   Nausea 07/01/2021   Gastroesophageal reflux disease with esophagitis without hemorrhage 07/01/2021   Constipation 10/21/2021   Sternum  pain 10/21/2021   Resolved Ambulatory Problems    Diagnosis Date Noted   Hyperparathyroidism, primary (HCC) 09/18/2016   Rash and nonspecific skin eruption 10/18/2016   Acute bronchitis 02/07/2018   Elevated serum creatinine 11/14/2018   Pneumonia of left upper lobe due to Chlamydia species 08/11/2019   Acute cystitis without hematuria 07/10/2020    Past Medical History:  Diagnosis Date   History of pulmonary embolism 03/27/2016   Microscopic colitis    Migraine    Morbid obesity (HCC) 05/27/2016    ROS See HPI.    Objective:     BP 118/76   Pulse (!) 102   SpO2 97%  BP Readings from Last 3 Encounters:  01/05/22 118/76  12/08/21 128/86  10/17/21 112/78   Wt Readings from Last 3 Encounters:  09/26/21 (!) 304 lb (137.9 kg)  01/31/21 (!) 317 lb (143.8 kg)  10/18/19 (!) 365 lb (165.6 kg)      Physical Exam Constitutional:      Appearance: Normal appearance. She is obese.  HENT:     Head: Normocephalic.  Cardiovascular:     Rate and Rhythm: Normal rate.  Pulmonary:     Effort: Pulmonary effort is normal.  Musculoskeletal:     Right lower leg: No edema.     Left lower leg: No edema.     Comments: Right knee swollen but not warm Tender to palpation over joint space Not able to fully extend or flex 1/5 lower ext strength bilateral  Skin:    Comments: Posterior knee fold less erythematous than 4 weeks ago healing papules/pustules no oozing.   Neurological:     General: No focal deficit present.     Mental Status: She is alert and oriented to person, place, and time.       Assessment & Plan:  Marland KitchenMarland KitchenLenice was seen today for follow-up.  Diagnoses and all orders for this visit:  Acute pain of right knee -     HYDROcodone-acetaminophen (NORCO/VICODIN) 5-325 MG tablet; Take 1 tablet by mouth every 12 (twelve) hours.  Psoriatic arthritis (HCC) -     DME Wheelchair manual -     DULoxetine (CYMBALTA) 20 MG capsule; Take 1 capsule (20 mg total) by mouth daily. -     HYDROcodone-acetaminophen (NORCO/VICODIN) 5-325 MG tablet; Take 1 tablet by mouth every 12 (twelve) hours.  Nausea  Drug-induced constipation  Skin infection -     fluconazole (DIFLUCAN) 150 MG tablet; Take once a week.  Depression, recurrent (HCC) -     buPROPion (WELLBUTRIN XL) 150 MG 24 hr tablet; Take 1 tablet (150 mg total) by mouth every  morning.   Failed amitiza Start linzess Discussed miralax up to twice a day in 4oz of juice until stooling regularly  Skin infection seems to be doing better Continue to keep dry Diflucan sent for 4 more weeks  Pt needs a better wheelchar Ordered placed today Cymbalta for pain Wellbutrin for mood .Marland KitchenPDMP reviewed during this encounter. Norco refilled up to twice a day for chronic pain  Right knee pain and giving way Not able to bear weight Xray then MRI if needed Ice and voltaren gel   Follow up in 1-2 months.    [name], PA-C

## 2022-01-05 NOTE — Patient Instructions (Addendum)
MRI of right knee.  Stop amitiza and will try to get linzess approved Miralax twice day

## 2022-01-05 NOTE — Telephone Encounter (Signed)
Wheelchair RX faxed to requested place. Patient given a copy at her visit today.

## 2022-01-09 ENCOUNTER — Encounter: Payer: Self-pay | Admitting: Physician Assistant

## 2022-01-09 NOTE — Progress Notes (Signed)
Established Patient Office Visit  Subjective   Patient ID: Sarah Mayer, female    DOB: 1971/01/02  Age: 51 y.o. MRN: [id number]  Chief Complaint  Patient presents with   Follow-up    HPI Pt is a 51 yo obese female who presents to the clinic with right posterior knee infection. It is getting worse and worse. It is hard for her to extend her leg fully out and she sweats behind the knee. It has developed sores behind the knee and they ooze. It is painful and itchy. No fever.   She needs ambien refilled for sleep.    Continues to have chronic pain associated with psoriatic arthritis. Needs refills of flexeril and voltaren gel.   Starting having some dysuria and urinary frequency for the last few days. Concerned about UTI. No fever, chills, body aches, flank pain. She does have some lower abdominal tenderness.  .. Active Ambulatory Problems    Diagnosis Date Noted   Psoriatic arthritis (HCC) 03/24/2016   Gastroesophageal reflux disease 03/24/2016   GAD (generalized anxiety disorder) 03/24/2016   Iron deficiency anemia 03/24/2016   History of pulmonary embolus (PE) 03/27/2016   History of DVT (deep vein thrombosis) 03/27/2016   Chronic tension-type headache, not intractable 03/27/2016   Primary insomnia 03/27/2016   Bilateral lower extremity edema 03/27/2016   Chronic pain syndrome 05/25/2016   Morbidly obese (HCC) 05/27/2016   Serum calcium elevated 08/30/2016   Diarrhea 08/30/2016   Collagenous colitis 10/18/2016   Barrett's esophagus without dysplasia 10/18/2016   Arthralgia 10/18/2016   Memory changes 11/23/2016   Major depressive disorder with current active episode 11/23/2016   OSA (obstructive sleep apnea) 11/23/2016   Twitching 12/22/2016   Upper airway cough syndrome 12/22/2016   Bilateral hip pain 12/22/2016   Tripping over things 01/12/2017   Left arm pain 01/12/2017   Left elbow pain 01/12/2017   Numbness and tingling in left arm 01/17/2017   Word finding  difficulty 03/15/2017   Intractable migraine without aura and with status migrainosus 03/15/2017   Edema of both feet 07/04/2017   B12 deficiency 07/04/2017   Acquired hypothyroidism 07/04/2017   Vitamin D deficiency 07/04/2017   Secondary hyperparathyroidism, non-renal (HCC) 07/04/2017   Psoriasis 07/06/2017   CFS (chronic fatigue syndrome) 07/06/2017   Easy bruising 07/26/2017   Myofascial pain 08/19/2017   Itching 08/19/2017   Yeast infection 08/19/2017   IBS (irritable bowel syndrome) 12/03/2017   Recurrent falls 12/29/2017   Weakness 02/11/2018   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   Urinary frequency 02/14/2018   Chronic left shoulder pain 03/15/2018   Chronic pain of left thumb 03/15/2018   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   Jerking 09/12/2018   Episode of confusion 09/12/2018   Bilateral leg pain 11/14/2018   Dry mouth 11/14/2018   Bilateral leg weakness 01/23/2019   Skin infection 01/23/2019   Chronic nonintractable headache 02/07/2019   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   Intertrigo 08/11/2019   Melasma 08/11/2019   OAB (overactive bladder) 08/11/2019   DDD (degenerative disc disease), lumbar 01/23/2020   Osteoarthritis, hip, bilateral 01/23/2020   Cough, persistent 06/24/2020   COVID-19 long hauler 02/18/2021   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   Hallucinations 07/01/2021   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   Nausea 07/01/2021   Gastroesophageal reflux disease with esophagitis without hemorrhage 07/01/2021   Constipation 10/21/2021   Sternum pain 10/21/2021   Resolved Ambulatory Problems    Diagnosis Date Noted   Hyperparathyroidism, primary (HCC) 09/18/2016   Rash and nonspecific skin eruption 10/18/2016  Acute bronchitis 02/07/2018   Elevated serum creatinine 11/14/2018   Pneumonia of left upper lobe due to Chlamydia species 08/11/2019   Acute cystitis without hematuria 07/10/2020   Past Medical History:  Diagnosis Date   History of pulmonary embolism 03/27/2016    Microscopic colitis    Migraine    Morbid obesity (HCC) 05/27/2016     ROS See HPI.    Objective:     BP 128/86   Pulse 100   Temp 98 F (36.7 C) (Oral)   SpO2 100%  BP Readings from Last 3 Encounters:  01/05/22 118/76  12/08/21 128/86  10/17/21 112/78   Wt Readings from Last 3 Encounters:  09/26/21 (!) 304 lb (137.9 kg)  01/31/21 (!) 317 lb (143.8 kg)  10/18/19 (!) 365 lb (165.6 kg)      Physical Exam Vitals reviewed.  Constitutional:      Appearance: Normal appearance.  HENT:     Head: Normocephalic.  Cardiovascular:     Rate and Rhythm: Normal rate and regular rhythm.  Pulmonary:     Effort: Pulmonary effort is normal.     Breath sounds: Normal breath sounds.  Skin:    Comments: Posterior skin infection behind right knee with bright red erythema and large oozing sores.   Neurological:     General: No focal deficit present.     Mental Status: She is alert and oriented to person, place, and time.  Psychiatric:        Mood and Affect: Mood normal.       Results for orders placed or performed in visit on 12/08/21  Urine Culture   Specimen: Urine  Result Value Ref Range   MICRO NUMBER: [id number]    SPECIMEN QUALITY: Adequate    Sample Source URINE    STATUS: FINAL    ISOLATE 1: Klebsiella pneumoniae (A)       Susceptibility   Klebsiella pneumoniae - URINE CULTURE, REFLEX    AMOX/CLAVULANIC <=2 Sensitive     AMPICILLIN 16 Resistant     AMPICILLIN/SULBACTAM 4 Sensitive     CEFAZOLIN* <=4 Not Reportable      * For infections other than uncomplicated UTI caused by E. coli, K. pneumoniae or P. mirabilis: Cefazolin is resistant if MIC > or = 8 mcg/mL. (Distinguishing susceptible versus intermediate for isolates with MIC < or = 4 mcg/mL requires additional testing.) For uncomplicated UTI caused by E. coli, K. pneumoniae or P. mirabilis: Cefazolin is susceptible if MIC <32 mcg/mL and predicts susceptible to the oral agents cefaclor,  cefdinir, cefpodoxime, cefprozil, cefuroxime, cephalexin and loracarbef.     CEFTAZIDIME <=1 Sensitive     CEFEPIME <=1 Sensitive     CEFTRIAXONE <=1 Sensitive     CIPROFLOXACIN <=0.25 Sensitive     LEVOFLOXACIN <=0.12 Sensitive     GENTAMICIN <=1 Sensitive     IMIPENEM <=0.25 Sensitive     NITROFURANTOIN 64 Intermediate     PIP/TAZO <=4 Sensitive     TOBRAMYCIN <=1 Sensitive     TRIMETH/SULFA* <=20 Sensitive      * For infections other than uncomplicated UTI caused by E. coli, K. pneumoniae or P. mirabilis: Cefazolin is resistant if MIC > or = 8 mcg/mL. (Distinguishing susceptible versus intermediate for isolates with MIC < or = 4 mcg/mL requires additional testing.) For uncomplicated UTI caused by E. coli, K. pneumoniae or P. mirabilis: Cefazolin is susceptible if MIC <32 mcg/mL and predicts susceptible to the oral agents cefaclor, cefdinir, cefpodoxime, cefprozil, cefuroxime, cephalexin and  loracarbef. Legend: S = Susceptible  I = Intermediate R = Resistant  NS = Not susceptible * = Not tested  NR = Not reported **NN = See antimicrobic comments   POCT URINALYSIS DIP (CLINITEK)  Result Value Ref Range   Color, UA yellow yellow   Clarity, UA clear clear   Glucose, UA negative negative mg/dL   Bilirubin, UA negative negative   Ketones, POC UA trace (5) (A) negative mg/dL   Spec Grav, UA 1.610 9.604 - 1.025   Blood, UA negative negative   pH, UA 6.0 5.0 - 8.0   POC PROTEIN,UA =30 (A) negative, trace   Urobilinogen, UA 4.0 (A) 0.2 or 1.0 E.U./dL   Nitrite, UA Positive (A) Negative   Leukocytes, UA Moderate (2+) (A) Negative       Assessment & Plan:  Marland KitchenMarland KitchenDeneane was seen today for follow-up.  Diagnoses and all orders for this visit:  Skin infection -     levofloxacin (LEVAQUIN) 500 MG tablet; Take 1 tablet (500 mg total) by mouth daily for 7 days.  Myofascial pain -     cyclobenzaprine (FLEXERIL) 10 MG tablet; Take 1 tablet by mouth three times daily as needed  for muscle spasm  Chronic pain of left thumb -     diclofenac Sodium (VOLTAREN) 1 % GEL; APPLY 4 GRAMS TOPICALLY FOUR TIMES DAILY AS NEEDED  Chronic pain of left ankle -     diclofenac Sodium (VOLTAREN) 1 % GEL; APPLY 4 GRAMS TOPICALLY FOUR TIMES DAILY AS NEEDED  Psoriatic arthritis (HCC)  CFS (chronic fatigue syndrome)  Dysuria -     POCT URINALYSIS DIP (CLINITEK) -     Urine Culture  Primary insomnia -     zolpidem (AMBIEN) 10 MG tablet; Take 1 tablet (10 mg total) by mouth at bedtime as needed.  Other orders -     Discontinue: fluconazole (DIFLUCAN) 150 MG tablet; Take once a week.    Refilled ambien/flexeril/voltaren.   Discussed skin infection  Keep dry with interdry OTC Start levaquin for infection but I think could be some yeast too Start diflucan Follow up in 4 weeks.   UA positive for leuks, nitrites and protein.  Will culture Start levaquin Increase hydration  Follow up as needed or if symptoms woren  Return in about 4 weeks (around 01/05/2022).    [name], PA-C

## 2022-01-12 NOTE — Progress Notes (Signed)
Called patient, LVM letting her know that Xray is needed before we can order MR. She is to call back with questions. Made her aware she could walk in to have this done.

## 2022-01-13 ENCOUNTER — Encounter: Payer: Self-pay | Admitting: Physician Assistant

## 2022-01-16 ENCOUNTER — Other Ambulatory Visit: Payer: Self-pay | Admitting: Physician Assistant

## 2022-01-16 DIAGNOSIS — F329 Major depressive disorder, single episode, unspecified: Secondary | ICD-10-CM

## 2022-01-16 DIAGNOSIS — F410 Panic disorder [episodic paroxysmal anxiety] without agoraphobia: Secondary | ICD-10-CM

## 2022-01-18 ENCOUNTER — Encounter: Payer: Self-pay | Admitting: Physician Assistant

## 2022-01-19 ENCOUNTER — Other Ambulatory Visit: Payer: Self-pay | Admitting: Physician Assistant

## 2022-01-19 ENCOUNTER — Encounter: Payer: Self-pay | Admitting: Sports Medicine

## 2022-01-19 ENCOUNTER — Ambulatory Visit (INDEPENDENT_AMBULATORY_CARE_PROVIDER_SITE_OTHER): Payer: 59

## 2022-01-19 ENCOUNTER — Ambulatory Visit (INDEPENDENT_AMBULATORY_CARE_PROVIDER_SITE_OTHER): Payer: 59 | Admitting: Sports Medicine

## 2022-01-19 DIAGNOSIS — M25561 Pain in right knee: Secondary | ICD-10-CM | POA: Diagnosis not present

## 2022-01-19 DIAGNOSIS — L4050 Arthropathic psoriasis, unspecified: Secondary | ICD-10-CM | POA: Diagnosis not present

## 2022-01-19 DIAGNOSIS — M170 Bilateral primary osteoarthritis of knee: Secondary | ICD-10-CM

## 2022-01-19 DIAGNOSIS — K5903 Drug induced constipation: Secondary | ICD-10-CM

## 2022-01-19 DIAGNOSIS — L089 Local infection of the skin and subcutaneous tissue, unspecified: Secondary | ICD-10-CM

## 2022-01-19 DIAGNOSIS — F339 Major depressive disorder, recurrent, unspecified: Secondary | ICD-10-CM

## 2022-01-19 DIAGNOSIS — R110 Nausea: Secondary | ICD-10-CM

## 2022-01-19 MED ORDER — TRIAMCINOLONE ACETONIDE 40 MG/ML IJ SUSP
80.0000 mg | Freq: Once | INTRAMUSCULAR | Status: AC
  Administered 2022-01-19: 80 mg via INTRAMUSCULAR

## 2022-01-19 NOTE — Assessment & Plan Note (Addendum)
This is a pleasant 51 year old female, history of psoriasis, bilateral knee osteoarthritis, severe patellofemoral on previous x-rays. She is taking chronic narcotics without sufficient improvement, and she is planning to start aquatic therapy which I think would be very beneficial. She has severe bilateral knee pain, she has very little motion with approximately 20 degrees of extension lag and can only flex an additional 20 to 30 degrees bilaterally. Due to severity of pain and ineffectiveness of oral analgesics we will do bilateral knee injections today, we did need to use a spinal needle due to body habitus. Return to see me in about 6 weeks, we can consider [place] +/- viscosupplementation if insufficiently better.

## 2022-01-19 NOTE — Progress Notes (Signed)
    Procedures performed today:    Procedure: Real-time Ultrasound Guided injection of the left knee Device: Samsung HS60  Verbal informed consent obtained.  Time-out conducted.  Noted no overlying erythema, induration, or other signs of local infection.  Skin prepped in a sterile fashion.  Local anesthesia: Topical Ethyl chloride.  With sterile technique and under real time ultrasound guidance: Trace effusion noted, 1 cc Kenalog 40, 2 cc lidocaine, 2 cc bupivacaine injected easily through a 22-gauge spinal needle. Completed without difficulty  Advised to call if fevers/chills, erythema, induration, drainage, or persistent bleeding.  Images permanently stored and available for review in PACS.  Impression: Technically successful ultrasound guided injection.   Procedure: Real-time Ultrasound Guided injection of the right knee Device: Samsung HS60  Verbal informed consent obtained.  Time-out conducted.  Noted no overlying erythema, induration, or other signs of local infection.  Skin prepped in a sterile fashion.  Local anesthesia: Topical Ethyl chloride.  With sterile technique and under real time ultrasound guidance: Trace effusion noted, 1 cc Kenalog 40, 2 cc lidocaine, 2 cc bupivacaine injected easily through a 22-gauge spinal needle. Completed without difficulty  Advised to call if fevers/chills, erythema, induration, drainage, or persistent bleeding.  Images permanently stored and available for review in PACS.  Impression: Technically successful ultrasound guided injection.  Independent interpretation of notes and tests performed by another provider:   None.  Brief History, Exam, Impression, and Recommendations:    Primary and psoriatic osteoarthritis of both knees This is a pleasant 51 year old female, history of psoriasis, bilateral knee osteoarthritis, severe patellofemoral on previous x-rays. She is taking chronic narcotics without sufficient improvement, and she is  planning to start aquatic therapy which I think would be very beneficial. She has severe bilateral knee pain, she has very little motion with approximately 20 degrees of extension lag and can only flex an additional 20 to 30 degrees bilaterally. Due to severity of pain and ineffectiveness of oral analgesics we will do bilateral knee injections today, we did need to use a spinal needle due to body habitus. Return to see me in about 6 weeks, we can consider [place] +/- viscosupplementation if insufficiently better.    ____________________________________________ Ihor Austin. Benjamin Stain, M.D., ABFM., CAQSM., AME. Primary Care and Sports Medicine [facility]  Adjunct Professor of Family Medicine  [employer]  Restaurant manager, fast food

## 2022-01-19 NOTE — Addendum Note (Signed)
Addended by: Carren Rang A on: 01/19/2022 11:15 AM   Modules accepted: Orders

## 2022-01-19 NOTE — Telephone Encounter (Signed)
Patient saw Dr. Karie Schwalbe today and discussed.

## 2022-01-22 ENCOUNTER — Other Ambulatory Visit: Payer: Self-pay | Admitting: Physician Assistant

## 2022-01-22 DIAGNOSIS — J209 Acute bronchitis, unspecified: Secondary | ICD-10-CM

## 2022-01-22 DIAGNOSIS — M7918 Myalgia, other site: Secondary | ICD-10-CM

## 2022-01-22 DIAGNOSIS — R053 Chronic cough: Secondary | ICD-10-CM

## 2022-01-22 DIAGNOSIS — R21 Rash and other nonspecific skin eruption: Secondary | ICD-10-CM

## 2022-01-22 NOTE — Progress Notes (Signed)
HI  Sarah Mayer,  I am covering for [facility] while she is out of the office.  The x-ray of your right knee shows pretty severe's arthritis in all 3 compartments of your knee.  No fracture.  We will forward these images to Dr. Benjamin Stain to take a look at it looks like you have consulted with him already.

## 2022-01-26 ENCOUNTER — Encounter: Payer: Self-pay | Admitting: Physician Assistant

## 2022-01-26 ENCOUNTER — Other Ambulatory Visit: Payer: Self-pay | Admitting: Physician Assistant

## 2022-01-26 DIAGNOSIS — F410 Panic disorder [episodic paroxysmal anxiety] without agoraphobia: Secondary | ICD-10-CM

## 2022-01-26 DIAGNOSIS — F329 Major depressive disorder, single episode, unspecified: Secondary | ICD-10-CM

## 2022-01-31 ENCOUNTER — Other Ambulatory Visit: Payer: Self-pay | Admitting: Physician Assistant

## 2022-01-31 DIAGNOSIS — G43011 Migraine without aura, intractable, with status migrainosus: Secondary | ICD-10-CM

## 2022-02-02 ENCOUNTER — Encounter: Payer: Self-pay | Admitting: Physician Assistant

## 2022-02-02 DIAGNOSIS — G4733 Obstructive sleep apnea (adult) (pediatric): Secondary | ICD-10-CM

## 2022-02-03 ENCOUNTER — Telehealth: Payer: Self-pay

## 2022-02-03 ENCOUNTER — Encounter: Payer: Self-pay | Admitting: Family Medicine

## 2022-02-03 ENCOUNTER — Ambulatory Visit (INDEPENDENT_AMBULATORY_CARE_PROVIDER_SITE_OTHER): Payer: 59 | Admitting: Family Medicine

## 2022-02-03 VITALS — BP 103/72 | HR 76 | Temp 98.3°F | Ht 64.0 in

## 2022-02-03 DIAGNOSIS — L89301 Pressure ulcer of unspecified buttock, stage 1: Secondary | ICD-10-CM | POA: Diagnosis not present

## 2022-02-03 MED ORDER — DOXYCYCLINE HYCLATE 100 MG PO TABS: 100.0000 mg | ORAL_TABLET | Freq: Two times a day (BID) | ORAL | 0 refills | Status: AC

## 2022-02-03 NOTE — Telephone Encounter (Signed)
Patients husband dropped off placard application to be completed by PCP, form placed in Jade's box, please advise, thanks!

## 2022-02-03 NOTE — Progress Notes (Signed)
Acute Office Visit  Subjective:     Patient ID: Sarah Mayer, female    DOB: 1970-04-06, 51 y.o.   MRN: [id number]  Chief Complaint  Patient presents with   pain in anal area    Patient in office - c/o  anal pain with sitting  x 3 to 4 weeks , redness and pruritus around anal area,  a lot of bright red  bleeding at site of excoriation just above  anal area   yeast under skin folds    Patient also has yeast in skin folds that she has been  using ketoconazole on and does seem to be helping . Requesting a rx rf of this.     HPI Patient is in today for acute visit. Pt is with her spouse Sarah Mayer.   Pt reports in the last 4 weeks, she has had pain near her anus. She has had yeast infections in her anus in the past and had creams that she had left that she used for a week and switched to another. The creams she used were Ketoconazole and Nystatin. They report now in the last few days, she's had a 'crack on the upper right buttock'. She has hx of psoriatic arthritis and is in wheelchair. She has pains in her hips and knees. Been in the wheelchair for a few years.   Past Medical History:  Diagnosis Date   Chronic pain syndrome 05/25/2016   Gastroesophageal reflux disease 03/24/2016   History of DVT (deep vein thrombosis) 03/27/2016   History of pulmonary embolism 03/27/2016   IBS (irritable bowel syndrome)    Microscopic colitis    Migraine    Morbid obesity (HCC) 05/27/2016   Psoriatic arthritis (HCC)     Review of Systems  Skin:        Lesion on buttock  All other systems reviewed and are negative.       Objective:    BP 103/72   Pulse 76   Temp 98.3 F (36.8 C)   Ht 5\' 4"  (1.626 m)   SpO2 98%   BMI 52.18 kg/m    Physical Exam Vitals and nursing note reviewed.  Constitutional:      Appearance: She is obese.  HENT:     Head: Normocephalic and atraumatic.     Right Ear: External ear normal.     Left Ear: External ear normal.     Mouth/Throat:     Mouth: Mucous membranes  are moist.     Pharynx: Oropharynx is clear.  Eyes:     Extraocular Movements: Extraocular movements intact.     Conjunctiva/sclera: Conjunctivae normal.  Cardiovascular:     Rate and Rhythm: Normal rate.  Pulmonary:     Effort: Pulmonary effort is normal.  Abdominal:     General: Abdomen is flat. Bowel sounds are normal.  Skin:    Capillary Refill: Capillary refill takes less than 2 seconds.     Comments: Stage 1 decubitus ulcer measuring 2x 2 cm  Neurological:     General: No focal deficit present.     Mental Status: She is alert. Mental status is at baseline.     Comments: In wheelchair  Psychiatric:        Mood and Affect: Mood normal.        Thought Content: Thought content normal.    No results found for any visits on 02/03/22.      Assessment & Plan:   Problem List Items Addressed This Visit  None Visit Diagnoses     Pressure injury of buttock, stage 1, unspecified laterality    -  Primary   Relevant Medications   doxycycline (VIBRA-TABS) 100 MG tablet   Other Relevant Orders   AMB referral to wound care center      Lesion most consistent with decubitus ulcer with hx of psoriatic arthritis and difficulty with ambulating. Pt in wheelchair today and reports she has been for several years. To keep area clean and dry with dial antibacterial soap Added doxycycline for 10 days Refer to wound care for further management, they requested wound care with [facility] in [place]  Meds ordered this encounter  Medications   doxycycline (VIBRA-TABS) 100 MG tablet    Sig: Take 1 tablet (100 mg total) by mouth 2 (two) times daily for 7 days.    Dispense:  20 tablet    Refill:  0    No follow-ups on file.  Suzan Slick, MD

## 2022-02-05 ENCOUNTER — Telehealth: Payer: Self-pay | Admitting: Gastroenterology

## 2022-02-05 NOTE — Telephone Encounter (Signed)
What kind of sleep study are we ordering? Split night?

## 2022-02-05 NOTE — Telephone Encounter (Signed)
Good Afternoon Dr. Barron Alvine,   Supervising MD for today PM    Patient called stating that she has a referral in from her PCP [name] to be seen for GERD, Barrett's esophagus and Collagenous. Patient has past gastro history with [facility] and [facility]. [facility] records are in epic, will you please review and advise on scheduling patient?   Thank you.

## 2022-02-07 ENCOUNTER — Encounter: Payer: Self-pay | Admitting: Family Medicine

## 2022-02-11 ENCOUNTER — Encounter: Payer: Self-pay | Admitting: Physician Assistant

## 2022-02-11 DIAGNOSIS — G8929 Other chronic pain: Secondary | ICD-10-CM

## 2022-02-12 ENCOUNTER — Other Ambulatory Visit: Payer: Self-pay | Admitting: Physician Assistant

## 2022-02-12 ENCOUNTER — Encounter: Payer: Self-pay | Admitting: Physician Assistant

## 2022-02-12 MED ORDER — DICLOFENAC SODIUM 1 % EX GEL: CUTANEOUS | 3 refills | Status: DC

## 2022-02-14 ENCOUNTER — Other Ambulatory Visit: Payer: Self-pay | Admitting: Physician Assistant

## 2022-02-14 DIAGNOSIS — L304 Erythema intertrigo: Secondary | ICD-10-CM

## 2022-02-14 NOTE — Telephone Encounter (Signed)
Available records reviewed and notable for the following:  - 09/24/2016: Colonoscopy (Dr. Rocky Crafts, [facility]): No report available for review - 10/08/2016: OV with Dr. Loman Chroman for follow-up of collagenous colitis.  Treated with Pepto-Bismol, Imodium, Lomotil.  Was treating GERD with OTC Prevacid twice daily, but still breakthrough heartburn.  Treated breakthrough with ranitidine.  No improvement with previous trial of Dexilant and possibly Nexium in the past.  Aciphex worked, but had to stop due to insurance, then retrialed and was not as efficacious.  Retrial of Aciphex 20 mg twice daily at that appointment.  Separately, suspected overlapping IBS as source for lower abdominal pain.  No improvement with IBgard.  At that time, continued Entocort, and trialed cholestyramine for 1 week, along with probiotic. - 10/22/2016: GES: Abnormal delayed gastric emptying with 20% at 2 hours, 30% at 3 hours, 70% at 4 hours. - 01/23/2020: EGD for Barrett's surveillance by Dr. Loman Chroman: Short segment Barrett's Esophagus [phone]) biopsied (path: Intestinal metaplasia without dysplasia).  Small hiatal hernia.  Normal stomach, duodenum   Per review of notes, looks like she has a referral in for [facility].  This was confirmed by her PCM. Is she still requesting transfer of care to [facility] instead?  If so, ok to schedule OV for transfer of care.

## 2022-02-17 MED ORDER — BUTALBITAL-APAP-CAFFEINE 50-325-40 MG PO TABS: ORAL_TABLET | ORAL | 0 refills | Status: DC

## 2022-02-18 ENCOUNTER — Encounter: Payer: Self-pay | Admitting: Physician Assistant

## 2022-02-21 ENCOUNTER — Other Ambulatory Visit: Payer: Self-pay | Admitting: Physician Assistant

## 2022-02-21 DIAGNOSIS — M7918 Myalgia, other site: Secondary | ICD-10-CM

## 2022-02-25 ENCOUNTER — Encounter: Payer: Self-pay | Admitting: Physician Assistant

## 2022-02-25 ENCOUNTER — Other Ambulatory Visit: Payer: Self-pay | Admitting: Physician Assistant

## 2022-02-25 DIAGNOSIS — M7918 Myalgia, other site: Secondary | ICD-10-CM

## 2022-02-28 ENCOUNTER — Other Ambulatory Visit: Payer: Self-pay | Admitting: Physician Assistant

## 2022-02-28 DIAGNOSIS — M7918 Myalgia, other site: Secondary | ICD-10-CM

## 2022-03-02 ENCOUNTER — Telehealth: Payer: 59 | Admitting: Physician Assistant

## 2022-03-02 ENCOUNTER — Ambulatory Visit: Payer: 59 | Admitting: Sports Medicine

## 2022-03-05 ENCOUNTER — Encounter: Payer: Self-pay | Admitting: Physician Assistant

## 2022-03-06 ENCOUNTER — Telehealth: Payer: 59 | Admitting: Physician Assistant

## 2022-03-06 MED ORDER — BUDESONIDE 3 MG PO CPEP: 9.0000 mg | ORAL_CAPSULE | Freq: Every morning | ORAL | 1 refills | Status: DC

## 2022-03-06 NOTE — Telephone Encounter (Signed)
Hx of colitis and entocort helping with symptoms. Sent to pharmacy. If not improving consider c.diff testing and evaluation.

## 2022-03-10 DIAGNOSIS — Z79899 Other long term (current) drug therapy: Secondary | ICD-10-CM | POA: Diagnosis not present

## 2022-03-10 DIAGNOSIS — L4050 Arthropathic psoriasis, unspecified: Secondary | ICD-10-CM | POA: Diagnosis not present

## 2022-03-11 ENCOUNTER — Encounter: Payer: Medicaid Other | Admitting: Physician Assistant

## 2022-03-11 NOTE — Progress Notes (Signed)
Called pt with no answer.  Sent links to patient and did not get on video chat.

## 2022-03-12 ENCOUNTER — Encounter: Payer: Self-pay | Admitting: Physician Assistant

## 2022-03-12 DIAGNOSIS — R197 Diarrhea, unspecified: Secondary | ICD-10-CM

## 2022-03-18 ENCOUNTER — Other Ambulatory Visit: Payer: Self-pay | Admitting: Physician Assistant

## 2022-03-26 ENCOUNTER — Other Ambulatory Visit: Payer: Self-pay | Admitting: Physician Assistant

## 2022-03-26 DIAGNOSIS — M7918 Myalgia, other site: Secondary | ICD-10-CM

## 2022-03-26 DIAGNOSIS — L4050 Arthropathic psoriasis, unspecified: Secondary | ICD-10-CM

## 2022-03-26 DIAGNOSIS — Z86711 Personal history of pulmonary embolism: Secondary | ICD-10-CM

## 2022-03-29 ENCOUNTER — Encounter: Payer: Self-pay | Admitting: Physician Assistant

## 2022-03-31 ENCOUNTER — Telehealth (INDEPENDENT_AMBULATORY_CARE_PROVIDER_SITE_OTHER): Payer: Medicaid Other | Admitting: Physician Assistant

## 2022-03-31 DIAGNOSIS — G894 Chronic pain syndrome: Secondary | ICD-10-CM | POA: Diagnosis not present

## 2022-03-31 DIAGNOSIS — L4050 Arthropathic psoriasis, unspecified: Secondary | ICD-10-CM

## 2022-03-31 DIAGNOSIS — J209 Acute bronchitis, unspecified: Secondary | ICD-10-CM

## 2022-03-31 MED ORDER — HYDROCODONE-ACETAMINOPHEN 10-325 MG PO TABS: 1.0000 | ORAL_TABLET | Freq: Two times a day (BID) | ORAL | 0 refills | Status: DC

## 2022-03-31 MED ORDER — PROMETHAZINE-DM 6.25-15 MG/5ML PO SYRP: 5.0000 mL | ORAL_SOLUTION | Freq: Four times a day (QID) | ORAL | 0 refills | Status: DC | PRN

## 2022-03-31 MED ORDER — PREDNISONE 20 MG PO TABS: ORAL_TABLET | ORAL | 0 refills | Status: DC

## 2022-03-31 NOTE — Progress Notes (Signed)
..Virtual Visit via Video Note  I connected with Sarah Mayer on 04/08/22 at  1:00 PM EST by a video enabled telemedicine application and verified that I am speaking with the correct person using two identifiers.  Location: Patient: home Provider: clinic  .Marland KitchenParticipating in visit:  Patient: Sarah Mayer Provider: Iran Planas PA-C    I discussed the limitations of evaluation and management by telemedicine and the availability of in person appointments. The patient expressed understanding and agreed to proceed.  History of Present Illness: Pt is a 52 yo obese female who calls in with cough and URI symptoms that started Friday night/Saturday morning. She is coughing up some brown tinged sputum. Felt like had low grade fever but not checked. SOB with exertion. Not using albuterol right now. Skyrizi injection last year. Some head congestion and ear fullness.   Pt request norco refill. No concerns or complaints. Takes as needed for pain control.   .. Active Ambulatory Problems    Diagnosis Date Noted   Primary and psoriatic osteoarthritis of both knees 03/24/2016   Gastroesophageal reflux disease 03/24/2016   GAD (generalized anxiety disorder) 03/24/2016   Iron deficiency anemia 03/24/2016   History of pulmonary embolus (PE) 03/27/2016   History of DVT (deep vein thrombosis) 03/27/2016   Chronic tension-type headache, not intractable 03/27/2016   Primary insomnia 03/27/2016   Bilateral lower extremity edema 03/27/2016   Chronic pain syndrome 05/25/2016   Morbidly obese (HCC) 05/27/2016   Serum calcium elevated 08/30/2016   Diarrhea 08/30/2016   Collagenous colitis 10/18/2016   Barrett's esophagus without dysplasia 10/18/2016   Arthralgia 10/18/2016   Memory changes 11/23/2016   Major depressive disorder with current active episode 11/23/2016   OSA (obstructive sleep apnea) 11/23/2016   Twitching 12/22/2016   Upper airway cough syndrome 12/22/2016   Bilateral hip pain 12/22/2016    Tripping over things 01/12/2017   Left arm pain 01/12/2017   Left elbow pain 01/12/2017   Numbness and tingling in left arm 01/17/2017   Word finding difficulty 03/15/2017   Intractable migraine without aura and with status migrainosus 03/15/2017   Edema of both feet 07/04/2017   B12 deficiency 07/04/2017   Acquired hypothyroidism 07/04/2017   Vitamin D deficiency 07/04/2017   Secondary hyperparathyroidism, non-renal ([place]) 07/04/2017   Psoriasis 07/06/2017   CFS (chronic fatigue syndrome) 07/06/2017   Easy bruising 07/26/2017   Myofascial pain 08/19/2017   Itching 08/19/2017   Yeast infection 08/19/2017   IBS (irritable bowel syndrome) 12/03/2017   Recurrent falls 12/29/2017   Weakness 02/11/2018   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   Urinary frequency 02/14/2018   Chronic left shoulder pain 03/15/2018   Chronic pain of left thumb 03/15/2018   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   Jerking 09/12/2018   Episode of confusion 09/12/2018   Bilateral leg pain 11/14/2018   Dry mouth 11/14/2018   Bilateral leg weakness 01/23/2019   Skin infection 01/23/2019   Chronic nonintractable headache 02/07/2019   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   Intertrigo 08/11/2019   Melasma 08/11/2019   OAB (overactive bladder) 08/11/2019   DDD (degenerative disc disease), lumbar 01/23/2020   Osteoarthritis, hip, bilateral 01/23/2020   Cough, persistent 06/24/2020   COVID-19 long hauler 02/18/2021   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   Hallucinations 07/01/2021   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   Nausea 07/01/2021   Gastroesophageal reflux disease with esophagitis without hemorrhage 07/01/2021   Constipation 10/21/2021   Sternum pain 10/21/2021   Resolved Ambulatory Problems    Diagnosis Date Noted   Hyperparathyroidism, primary ([place]) 09/18/2016  Rash and nonspecific skin eruption 10/18/2016   Acute bronchitis 02/07/2018   Elevated serum creatinine 11/14/2018   Pneumonia of left upper lobe due to Chlamydia  species 08/11/2019   Acute cystitis without hematuria 07/10/2020   Past Medical History:  Diagnosis Date   History of pulmonary embolism 03/27/2016   Microscopic colitis    Migraine    Morbid obesity ([place]) 05/27/2016   Psoriatic arthritis ([place])        Observations/Objective: No acute distress Normal breathing Normal appearance     Assessment and Plan: Marland KitchenMarland KitchenDiagnoses and all orders for this visit:  Acute bronchitis, unspecified organism -     predniSONE (DELTASONE) 20 MG tablet; Take 3 tablets for 3 days, take 2 tablets for 3 days, take 1 tablet for 3 days, take 1/2 tablet for 4 days. -     promethazine-dextromethorphan (PROMETHAZINE-DM) 6.25-15 MG/5ML syrup; Take 5 mLs by mouth 4 (four) times daily as needed for cough.  Psoriatic arthritis ([place]) -     HYDROcodone-acetaminophen (NORCO) 10-325 MG tablet; Take 1 tablet by mouth every 12 (twelve) hours.  Chronic pain syndrome -     HYDROcodone-acetaminophen (NORCO) 10-325 MG tablet; Take 1 tablet by mouth every 12 (twelve) hours.   Viral bronchitis  Sent prednisone and cough syrup Use albuterol has needed Continue OTC medications like tylenol could sinus severe  Refilled norco today for chronic pain .Marland KitchenPDMP reviewed during this encounter. No concerns UTD pain contract and UDS Follow up in 3 months or sooner due to [place] controlled substance laws   Follow Up Instructions:    I discussed the assessment and treatment plan with the patient. The patient was provided an opportunity to ask questions and all were answered. The patient agreed with the plan and demonstrated an understanding of the instructions.   The patient was advised to call back or seek an in-person evaluation if the symptoms worsen or if the condition fails to improve as anticipated.    Iran Planas, PA-C

## 2022-04-04 ENCOUNTER — Other Ambulatory Visit: Payer: Self-pay | Admitting: Physician Assistant

## 2022-04-04 DIAGNOSIS — F339 Major depressive disorder, recurrent, unspecified: Secondary | ICD-10-CM

## 2022-04-04 DIAGNOSIS — F410 Panic disorder [episodic paroxysmal anxiety] without agoraphobia: Secondary | ICD-10-CM

## 2022-04-08 ENCOUNTER — Encounter: Payer: Self-pay | Admitting: Physician Assistant

## 2022-04-13 ENCOUNTER — Encounter: Payer: Self-pay | Admitting: Physician Assistant

## 2022-04-13 MED ORDER — BUTALBITAL-APAP-CAFFEINE 50-325-40 MG PO TABS: ORAL_TABLET | ORAL | 0 refills | Status: DC

## 2022-04-13 NOTE — Telephone Encounter (Signed)
Rx sent for regular Fioicet ( without codeine).  The last 3 prescriptions that have been sent were without codeine and she is already on chronic narcotic pain medication.  Meds ordered this encounter  Medications   butalbital-acetaminophen-caffeine (FIORICET) 50-325-40 MG tablet    Sig: TAKE ONE TABLET BY MOUTH EVERY 6 HOURS AS NEEDED FOR HEADACHE    Dispense:  14 tablet    Refill:  0

## 2022-04-13 NOTE — Telephone Encounter (Signed)
Forwarding to Dr. Madilyn Fireman covering Iran Planas, PA  Pt requesting rx rf of Fioricet but does not show in current med list  Last written by Luvenia Starch o 02/17/22.  Last OV 03/31/2022 virutal visit  No upcoming appt schld

## 2022-04-14 DIAGNOSIS — M159 Polyosteoarthritis, unspecified: Secondary | ICD-10-CM | POA: Diagnosis not present

## 2022-04-14 DIAGNOSIS — L409 Psoriasis, unspecified: Secondary | ICD-10-CM | POA: Diagnosis not present

## 2022-04-14 DIAGNOSIS — R600 Localized edema: Secondary | ICD-10-CM | POA: Diagnosis not present

## 2022-04-14 DIAGNOSIS — L4050 Arthropathic psoriasis, unspecified: Secondary | ICD-10-CM | POA: Diagnosis not present

## 2022-04-14 DIAGNOSIS — Z79899 Other long term (current) drug therapy: Secondary | ICD-10-CM | POA: Diagnosis not present

## 2022-05-06 ENCOUNTER — Other Ambulatory Visit: Payer: Self-pay | Admitting: Neurology

## 2022-05-06 DIAGNOSIS — F329 Major depressive disorder, single episode, unspecified: Secondary | ICD-10-CM

## 2022-05-06 DIAGNOSIS — F410 Panic disorder [episodic paroxysmal anxiety] without agoraphobia: Secondary | ICD-10-CM

## 2022-05-06 MED ORDER — VORTIOXETINE HBR 20 MG PO TABS: 20.0000 mg | ORAL_TABLET | Freq: Every day | ORAL | 0 refills | Status: DC

## 2022-05-07 ENCOUNTER — Other Ambulatory Visit: Payer: Self-pay | Admitting: Physician Assistant

## 2022-05-08 ENCOUNTER — Encounter: Payer: Self-pay | Admitting: Physician Assistant

## 2022-05-08 MED ORDER — DICYCLOMINE HCL 20 MG PO TABS: 20.0000 mg | ORAL_TABLET | Freq: Three times a day (TID) | ORAL | 0 refills | Status: DC

## 2022-05-09 ENCOUNTER — Encounter: Payer: Self-pay | Admitting: Physician Assistant

## 2022-05-09 DIAGNOSIS — N3281 Overactive bladder: Secondary | ICD-10-CM

## 2022-05-09 DIAGNOSIS — G894 Chronic pain syndrome: Secondary | ICD-10-CM

## 2022-05-09 DIAGNOSIS — L4050 Arthropathic psoriasis, unspecified: Secondary | ICD-10-CM

## 2022-05-11 DIAGNOSIS — R112 Nausea with vomiting, unspecified: Secondary | ICD-10-CM | POA: Diagnosis not present

## 2022-05-11 DIAGNOSIS — K529 Noninfective gastroenteritis and colitis, unspecified: Secondary | ICD-10-CM | POA: Diagnosis not present

## 2022-05-11 MED ORDER — HYDROCODONE-ACETAMINOPHEN 10-325 MG PO TABS: 1.0000 | ORAL_TABLET | Freq: Two times a day (BID) | ORAL | 0 refills | Status: DC

## 2022-05-11 NOTE — Telephone Encounter (Signed)
..  PDMP reviewed during this encounter. Refilled norco.

## 2022-05-13 MED ORDER — OXYBUTYNIN CHLORIDE ER 10 MG PO TB24: ORAL_TABLET | ORAL | 3 refills | Status: DC

## 2022-05-13 NOTE — Addendum Note (Signed)
Addended by: Narda Rutherford on: 05/13/2022 08:09 AM   Modules accepted: Orders

## 2022-05-15 ENCOUNTER — Ambulatory Visit: Payer: 59 | Admitting: Physician Assistant

## 2022-05-17 ENCOUNTER — Other Ambulatory Visit: Payer: Self-pay | Admitting: Physician Assistant

## 2022-05-17 DIAGNOSIS — G43011 Migraine without aura, intractable, with status migrainosus: Secondary | ICD-10-CM

## 2022-05-17 DIAGNOSIS — M7918 Myalgia, other site: Secondary | ICD-10-CM

## 2022-05-18 MED ORDER — ONDANSETRON 8 MG PO TBDP: ORAL_TABLET | ORAL | 2 refills | Status: DC

## 2022-05-18 NOTE — Addendum Note (Signed)
Addended byAnnamaria Helling on: 05/18/2022 10:13 AM   Modules accepted: Orders

## 2022-06-02 DIAGNOSIS — L439 Lichen planus, unspecified: Secondary | ICD-10-CM | POA: Diagnosis not present

## 2022-06-02 DIAGNOSIS — L409 Psoriasis, unspecified: Secondary | ICD-10-CM | POA: Diagnosis not present

## 2022-06-02 DIAGNOSIS — K639 Disease of intestine, unspecified: Secondary | ICD-10-CM | POA: Diagnosis not present

## 2022-06-02 DIAGNOSIS — Z5181 Encounter for therapeutic drug level monitoring: Secondary | ICD-10-CM | POA: Diagnosis not present

## 2022-06-02 DIAGNOSIS — L4050 Arthropathic psoriasis, unspecified: Secondary | ICD-10-CM | POA: Diagnosis not present

## 2022-06-03 ENCOUNTER — Ambulatory Visit: Payer: BLUE CROSS/BLUE SHIELD

## 2022-06-03 ENCOUNTER — Ambulatory Visit (INDEPENDENT_AMBULATORY_CARE_PROVIDER_SITE_OTHER): Payer: 59 | Admitting: Physician Assistant

## 2022-06-03 VITALS — BP 131/89 | HR 95 | Wt 245.0 lb

## 2022-06-03 DIAGNOSIS — Z1231 Encounter for screening mammogram for malignant neoplasm of breast: Secondary | ICD-10-CM

## 2022-06-03 DIAGNOSIS — E559 Vitamin D deficiency, unspecified: Secondary | ICD-10-CM | POA: Diagnosis not present

## 2022-06-03 DIAGNOSIS — G894 Chronic pain syndrome: Secondary | ICD-10-CM | POA: Diagnosis not present

## 2022-06-03 DIAGNOSIS — R110 Nausea: Secondary | ICD-10-CM | POA: Diagnosis not present

## 2022-06-03 DIAGNOSIS — L4050 Arthropathic psoriasis, unspecified: Secondary | ICD-10-CM | POA: Diagnosis not present

## 2022-06-03 DIAGNOSIS — R233 Spontaneous ecchymoses: Secondary | ICD-10-CM

## 2022-06-03 DIAGNOSIS — Z1329 Encounter for screening for other suspected endocrine disorder: Secondary | ICD-10-CM | POA: Diagnosis not present

## 2022-06-03 DIAGNOSIS — Z13220 Encounter for screening for lipoid disorders: Secondary | ICD-10-CM

## 2022-06-03 DIAGNOSIS — L299 Pruritus, unspecified: Secondary | ICD-10-CM

## 2022-06-03 DIAGNOSIS — Z131 Encounter for screening for diabetes mellitus: Secondary | ICD-10-CM | POA: Diagnosis not present

## 2022-06-03 DIAGNOSIS — Z01419 Encounter for gynecological examination (general) (routine) without abnormal findings: Secondary | ICD-10-CM | POA: Diagnosis not present

## 2022-06-03 MED ORDER — DULOXETINE HCL 30 MG PO CPEP: 30.0000 mg | ORAL_CAPSULE | Freq: Every day | ORAL | 1 refills | Status: DC

## 2022-06-03 NOTE — Progress Notes (Signed)
Acute Office Visit  Subjective:     Patient ID: Sarah Mayer, female    DOB: [birth year], 52 y.o.   MRN: [id number]  Chief Complaint  Patient presents with   Follow-up    HPI Patient is in today  for follow up. She has pmh for Migraines. OSA, GERD, CFS, Psoratic arthritis, OA. She is very limited in her ambulation. She has done really well with weight loss over the last 3 months and down to 245 from over 300. She is with new rheumatologist Dr. Artis Flock and on skyrizi. She is having some increase in leg bruising right more than left. She is having some leg itching and hydroxyzine helps. She has been more nauseated in the mornings. She does take pills on empty stomach.   .. Active Ambulatory Problems    Diagnosis Date Noted   Primary and psoriatic osteoarthritis of both knees 03/24/2016   Gastroesophageal reflux disease 03/24/2016   GAD (generalized anxiety disorder) 03/24/2016   Iron deficiency anemia 03/24/2016   History of pulmonary embolus (PE) 03/27/2016   History of DVT (deep vein thrombosis) 03/27/2016   Chronic tension-type headache, not intractable 03/27/2016   Primary insomnia 03/27/2016   Bilateral lower extremity edema 03/27/2016   Chronic pain syndrome 05/25/2016   Morbidly obese (HCC) 05/27/2016   Serum calcium elevated 08/30/2016   Diarrhea 08/30/2016   Collagenous colitis 10/18/2016   Barrett's esophagus without dysplasia 10/18/2016   Arthralgia 10/18/2016   Memory changes 11/23/2016   Major depressive disorder with current active episode 11/23/2016   OSA (obstructive sleep apnea) 11/23/2016   Twitching 12/22/2016   Upper airway cough syndrome 12/22/2016   Bilateral hip pain 12/22/2016   Tripping over things 01/12/2017   Left arm pain 01/12/2017   Left elbow pain 01/12/2017   Numbness and tingling in left arm 01/17/2017   Word finding difficulty 03/15/2017   Intractable migraine without aura and with status migrainosus 03/15/2017   Edema of both feet  07/04/2017   B12 deficiency 07/04/2017   Vitamin D deficiency 07/04/2017   Secondary hyperparathyroidism, non-renal (HCC) 07/04/2017   Psoriasis 07/06/2017   CFS (chronic fatigue syndrome) 07/06/2017   Easy bruising 07/26/2017   Myofascial pain 08/19/2017   Itching 08/19/2017   Yeast infection 08/19/2017   IBS (irritable bowel syndrome) 12/03/2017   Recurrent falls 12/29/2017   Weakness 02/11/2018   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   Urinary frequency 02/14/2018   Chronic left shoulder pain 03/15/2018   Chronic pain of left thumb 03/15/2018   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   Jerking 09/12/2018   Episode of confusion 09/12/2018   Bilateral leg pain 11/14/2018   Dry mouth 11/14/2018   Bilateral leg weakness 01/23/2019   Skin infection 01/23/2019   Chronic nonintractable headache 02/07/2019   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   Intertrigo 08/11/2019   Melasma 08/11/2019   OAB (overactive bladder) 08/11/2019   DDD (degenerative disc disease), lumbar 01/23/2020   Osteoarthritis, hip, bilateral 01/23/2020   Cough, persistent 06/24/2020   COVID-19 long hauler 02/18/2021   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   Hallucinations 07/01/2021   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   Nausea 07/01/2021   Gastroesophageal reflux disease with esophagitis without hemorrhage 07/01/2021   Constipation 10/21/2021   Sternum pain 10/21/2021   Psoriatic arthritis (HCC) 06/03/2022   Resolved Ambulatory Problems    Diagnosis Date Noted   Hyperparathyroidism, primary (HCC) 09/18/2016   Rash and nonspecific skin eruption 10/18/2016   Acquired hypothyroidism 07/04/2017   Acute bronchitis 02/07/2018   Elevated serum creatinine 11/14/2018  Pneumonia of left upper lobe due to Chlamydia species 08/11/2019   Acute cystitis without hematuria 07/10/2020   Past Medical History:  Diagnosis Date   History of pulmonary embolism 03/27/2016   Microscopic colitis    Migraine    Morbid obesity (HCC) 05/27/2016      ROS  See HPI.     Objective:    BP 131/89   Pulse 95   Wt 245 lb (111.1 kg) Comment: refused wgt check  SpO2 99%   BMI 42.05 kg/m  BP Readings from Last 3 Encounters:  06/03/22 131/89  02/03/22 103/72  01/05/22 118/76   Wt Readings from Last 3 Encounters:  06/03/22 245 lb (111.1 kg)  09/26/21 (!) 304 lb (137.9 kg)  01/31/21 (!) 317 lb (143.8 kg)     Physical Exam Constitutional:      Appearance: Normal appearance. She is obese.  HENT:     Head: Normocephalic.  Cardiovascular:     Rate and Rhythm: Normal rate and regular rhythm.  Pulmonary:     Effort: Pulmonary effort is normal.  Musculoskeletal:     Right lower leg: No edema.     Left lower leg: No edema.  Skin:    Comments: Scattered bruising on bilateral legs.   Neurological:     General: No focal deficit present.     Mental Status: She is alert and oriented to person, place, and time.  Psychiatric:        Mood and Affect: Mood normal.           Assessment & Plan:  Marland KitchenMarland KitchenAdilenne was seen today for follow-up.  Diagnoses and all orders for this visit:  Psoriatic arthritis (HCC) -     Lipid Panel w/reflex Direct LDL -     COMPLETE METABOLIC PANEL WITH GFR -     TSH -     VITAMIN D 25 Hydroxy (Vit-D Deficiency, Fractures) -     CBC w/Diff/Platelet -     Fe+TIBC+Fer  Screening for diabetes mellitus -     COMPLETE METABOLIC PANEL WITH GFR  Screening for lipid disorders -     Lipid Panel w/reflex Direct LDL  Thyroid disorder screening -     TSH  Vitamin D deficiency -     VITAMIN D 25 Hydroxy (Vit-D Deficiency, Fractures)  Encounter for annual routine gynecological examination -     Ambulatory referral to Gynecology  Easy bruising  Nausea  Itching  Chronic pain syndrome -     DULoxetine (CYMBALTA) 30 MG capsule; Take 1 capsule (30 mg total) by mouth daily.   Screening labs ordered Referral to GYN Increase cymbalta due to chronic pain Take medications with food to help with  nausea    [name], PA-C

## 2022-06-03 NOTE — Patient Instructions (Addendum)
Increase cymbalta.  Get labs.  Take medications with food.

## 2022-06-04 LAB — COMPLETE METABOLIC PANEL WITH GFR
AG Ratio: 1.7 (calc) (ref 1.0–2.5)
ALT: 7 U/L (ref 6–29)
AST: 7 U/L — ABNORMAL LOW (ref 10–35)
Albumin: 4.2 g/dL (ref 3.6–5.1)
Alkaline phosphatase (APISO): 83 U/L (ref 37–153)
BUN: 10 mg/dL (ref 7–25)
CO2: 29 mmol/L (ref 20–32)
Calcium: 9.6 mg/dL (ref 8.6–10.4)
Chloride: 104 mmol/L (ref 98–110)
Creat: 0.73 mg/dL (ref 0.50–1.03)
Globulin: 2.5 g/dL (calc) (ref 1.9–3.7)
Glucose, Bld: 80 mg/dL (ref 65–99)
Potassium: 4 mmol/L (ref 3.5–5.3)
Sodium: 141 mmol/L (ref 135–146)
Total Bilirubin: 0.5 mg/dL (ref 0.2–1.2)
Total Protein: 6.7 g/dL (ref 6.1–8.1)
eGFR: 100 mL/min/{1.73_m2} (ref >=60)

## 2022-06-04 LAB — CBC WITH DIFFERENTIAL/PLATELET
Absolute Monocytes: 672 cells/uL (ref 200–950)
Basophils Absolute: 59 cells/uL (ref 0–200)
Basophils Relative: 0.7 %
Eosinophils Absolute: 109 cells/uL (ref 15–500)
Eosinophils Relative: 1.3 %
HCT: 40.4 % (ref 35.0–45.0)
Hemoglobin: 13 g/dL (ref 11.7–15.5)
Lymphs Abs: 2629 cells/uL (ref 850–3900)
MCH: 27.9 pg (ref 27.0–33.0)
MCHC: 32.2 g/dL (ref 32.0–36.0)
MCV: 86.7 fL (ref 80.0–100.0)
MPV: 11.7 fL (ref 7.5–12.5)
Monocytes Relative: 8 %
Neutro Abs: 4931 cells/uL (ref 1500–7800)
Neutrophils Relative %: 58.7 %
Platelets: 349 10*3/uL (ref 140–400)
RBC: 4.66 10*6/uL (ref 3.80–5.10)
RDW: 13.4 % (ref 11.0–15.0)
Total Lymphocyte: 31.3 %
WBC: 8.4 10*3/uL (ref 3.8–10.8)

## 2022-06-04 LAB — VITAMIN D 25 HYDROXY (VIT D DEFICIENCY, FRACTURES): Vit D, 25-Hydroxy: 50 ng/mL (ref 30–100)

## 2022-06-04 LAB — IRON,TIBC AND FERRITIN PANEL
%SAT: 21 % (calc) (ref 16–45)
Ferritin: 77 ng/mL (ref 16–232)
Iron: 73 ug/dL (ref 45–160)
TIBC: 341 mcg/dL (calc) (ref 250–450)

## 2022-06-04 LAB — LIPID PANEL W/REFLEX DIRECT LDL
Cholesterol: 155 mg/dL (ref <200)
HDL: 56 mg/dL (ref >=50)
LDL Cholesterol (Calc): 84 mg/dL (calc)
Non-HDL Cholesterol (Calc): 99 mg/dL (calc) (ref <130)
Total CHOL/HDL Ratio: 2.8 (calc) (ref <5.0)
Triglycerides: 71 mg/dL (ref <150)

## 2022-06-04 LAB — TSH: TSH: 0.95 m[IU]/L

## 2022-06-07 ENCOUNTER — Encounter: Payer: Self-pay | Admitting: Physician Assistant

## 2022-06-08 NOTE — Progress Notes (Signed)
Loza,   Kidney, liver, glucose look good.  Cholesterol looks great.  Thyroid looks great.  Vitamin D good.  Normal iron and iron stores.

## 2022-06-13 ENCOUNTER — Other Ambulatory Visit: Payer: Self-pay | Admitting: Physician Assistant

## 2022-06-13 DIAGNOSIS — G894 Chronic pain syndrome: Secondary | ICD-10-CM

## 2022-06-13 DIAGNOSIS — M7918 Myalgia, other site: Secondary | ICD-10-CM

## 2022-06-16 ENCOUNTER — Other Ambulatory Visit: Payer: Self-pay | Admitting: Physician Assistant

## 2022-06-16 DIAGNOSIS — G8929 Other chronic pain: Secondary | ICD-10-CM

## 2022-06-19 ENCOUNTER — Encounter: Payer: Self-pay | Admitting: Physician Assistant

## 2022-06-19 MED ORDER — BUTALBITAL-APAP-CAFFEINE 50-325-40 MG PO TABS: ORAL_TABLET | ORAL | 0 refills | Status: DC

## 2022-06-20 ENCOUNTER — Other Ambulatory Visit: Payer: Self-pay | Admitting: Physician Assistant

## 2022-06-27 ENCOUNTER — Other Ambulatory Visit: Payer: Self-pay | Admitting: Physician Assistant

## 2022-06-27 ENCOUNTER — Encounter: Payer: Self-pay | Admitting: Physician Assistant

## 2022-06-27 DIAGNOSIS — G894 Chronic pain syndrome: Secondary | ICD-10-CM

## 2022-06-27 DIAGNOSIS — F5101 Primary insomnia: Secondary | ICD-10-CM

## 2022-06-27 DIAGNOSIS — L4050 Arthropathic psoriasis, unspecified: Secondary | ICD-10-CM

## 2022-06-29 MED ORDER — HYDROCODONE-ACETAMINOPHEN 10-325 MG PO TABS
1.0000 | ORAL_TABLET | Freq: Two times a day (BID) | ORAL | 0 refills | Status: DC
Start: 2022-06-29 — End: 2022-08-06

## 2022-06-29 NOTE — Telephone Encounter (Signed)
Last fill 12/08/21 last visit 06/09/22

## 2022-07-01 ENCOUNTER — Encounter: Payer: Self-pay | Admitting: Physician Assistant

## 2022-07-01 ENCOUNTER — Ambulatory Visit (INDEPENDENT_AMBULATORY_CARE_PROVIDER_SITE_OTHER): Payer: 59 | Admitting: Physician Assistant

## 2022-07-01 VITALS — BP 118/92 | HR 90 | Ht 64.0 in

## 2022-07-01 DIAGNOSIS — F411 Generalized anxiety disorder: Secondary | ICD-10-CM | POA: Diagnosis not present

## 2022-07-01 DIAGNOSIS — H9192 Unspecified hearing loss, left ear: Secondary | ICD-10-CM | POA: Diagnosis not present

## 2022-07-01 DIAGNOSIS — S31105A Unspecified open wound of abdominal wall, periumbilic region without penetration into peritoneal cavity, initial encounter: Secondary | ICD-10-CM

## 2022-07-01 MED ORDER — CLINDAMYCIN PHOSPHATE 1 % EX GEL
Freq: Two times a day (BID) | CUTANEOUS | 0 refills | Status: DC
Start: 2022-07-01 — End: 2023-01-12

## 2022-07-01 MED ORDER — WEGOVY 1 MG/0.5ML ~~LOC~~ SOAJ
1.0000 mg | SUBCUTANEOUS | 0 refills | Status: DC
Start: 2022-08-31 — End: 2023-01-12

## 2022-07-01 MED ORDER — WEGOVY 0.5 MG/0.5ML ~~LOC~~ SOAJ
0.5000 mg | SUBCUTANEOUS | 0 refills | Status: DC
Start: 2022-08-01 — End: 2023-01-12

## 2022-07-01 MED ORDER — PROPRANOLOL HCL 10 MG PO TABS
10.0000 mg | ORAL_TABLET | Freq: Three times a day (TID) | ORAL | 1 refills | Status: DC
Start: 2022-07-01 — End: 2022-08-10

## 2022-07-01 MED ORDER — WEGOVY 0.25 MG/0.5ML ~~LOC~~ SOAJ
0.2500 mg | SUBCUTANEOUS | 0 refills | Status: DC
Start: 2022-07-01 — End: 2023-01-12

## 2022-07-01 NOTE — Progress Notes (Signed)
Acute Office Visit  Subjective:     Patient ID: Sarah Mayer, female    DOB: 11-Nov-1970, 52 y.o.   MRN: [id number]  Chief Complaint  Patient presents with   Rash    Belly button    HPI Patient is in today for red and tender belly button for over a week. She had been treating with neosporin and keeping clean but has not resolved. No known injury. She has noticed some drainage. No fever, chills, abdominal pain.   Friend suggested propranolol for anxiety. She would like to try it.   She is having some left ear hearing loss. She would like evaluation. No pain or ear discharge.   She was on wegovy and would like to restart for weight loss. She was doing well. No concerns.   .. Active Ambulatory Problems    Diagnosis Date Noted   Primary and psoriatic osteoarthritis of both knees 03/24/2016   Gastroesophageal reflux disease 03/24/2016   GAD (generalized anxiety disorder) 03/24/2016   Iron deficiency anemia 03/24/2016   History of pulmonary embolus (PE) 03/27/2016   History of DVT (deep vein thrombosis) 03/27/2016   Chronic tension-type headache, not intractable 03/27/2016   Primary insomnia 03/27/2016   Bilateral lower extremity edema 03/27/2016   Chronic pain syndrome 05/25/2016   Morbidly obese (HCC) 05/27/2016   Serum calcium elevated 08/30/2016   Diarrhea 08/30/2016   Collagenous colitis 10/18/2016   Barrett's esophagus without dysplasia 10/18/2016   Arthralgia 10/18/2016   Memory changes 11/23/2016   Major depressive disorder with current active episode 11/23/2016   OSA (obstructive sleep apnea) 11/23/2016   Twitching 12/22/2016   Upper airway cough syndrome 12/22/2016   Bilateral hip pain 12/22/2016   Tripping over things 01/12/2017   Left arm pain 01/12/2017   Left elbow pain 01/12/2017   Numbness and tingling in left arm 01/17/2017   Word finding difficulty 03/15/2017   Intractable migraine without aura and with status migrainosus 03/15/2017   Edema of  both feet 07/04/2017   B12 deficiency 07/04/2017   Vitamin D deficiency 07/04/2017   Secondary hyperparathyroidism, non-renal (HCC) 07/04/2017   Psoriasis 07/06/2017   CFS (chronic fatigue syndrome) 07/06/2017   Easy bruising 07/26/2017   Myofascial pain 08/19/2017   Itching 08/19/2017   Yeast infection 08/19/2017   IBS (irritable bowel syndrome) 12/03/2017   Recurrent falls 12/29/2017   Weakness 02/11/2018   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   Urinary frequency 02/14/2018   Chronic left shoulder pain 03/15/2018   Chronic pain of left thumb 03/15/2018   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   Jerking 09/12/2018   Episode of confusion 09/12/2018   Bilateral leg pain 11/14/2018   Dry mouth 11/14/2018   Bilateral leg weakness 01/23/2019   Skin infection 01/23/2019   Chronic nonintractable headache 02/07/2019   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   Intertrigo 08/11/2019   Melasma 08/11/2019   OAB (overactive bladder) 08/11/2019   DDD (degenerative disc disease), lumbar 01/23/2020   Osteoarthritis, hip, bilateral 01/23/2020   Cough, persistent 06/24/2020   COVID-19 long hauler 02/18/2021   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   Hallucinations 07/01/2021   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   Nausea 07/01/2021   Gastroesophageal reflux disease with esophagitis without hemorrhage 07/01/2021   Constipation 10/21/2021   Sternum pain 10/21/2021   Psoriatic arthritis (HCC) 06/03/2022   Open wound of umbilical region 07/03/2022   Resolved Ambulatory Problems    Diagnosis Date Noted   Hyperparathyroidism, primary (HCC) 09/18/2016   Rash and nonspecific skin eruption 10/18/2016   Acquired hypothyroidism  07/04/2017   Acute bronchitis 02/07/2018   Elevated serum creatinine 11/14/2018   Pneumonia of left upper lobe due to Chlamydia species 08/11/2019   Acute cystitis without hematuria 07/10/2020   Past Medical History:  Diagnosis Date   History of pulmonary embolism 03/27/2016   Microscopic colitis     Migraine    Morbid obesity (HCC) 05/27/2016     ROS See HPI.      Objective:    BP (!) 118/92   Pulse 90   Ht 5\' 4"  (1.626 m)   SpO2 99%   BMI 42.05 kg/m  BP Readings from Last 3 Encounters:  07/01/22 (!) 118/92  06/03/22 131/89  02/03/22 103/72   Wt Readings from Last 3 Encounters:  06/03/22 245 lb (111.1 kg)  09/26/21 (!) 304 lb (137.9 kg)  01/31/21 (!) 317 lb (143.8 kg)  ..    07/01/2022   12:05 PM 06/03/2022    2:54 PM 01/05/2022    3:16 PM 12/08/2021    3:09 PM 07/01/2021    1:41 PM  Depression screen PHQ 2/9  Decreased Interest 0 2 3 3 1   Down, Depressed, Hopeless 0 2 3 3 3   PHQ - 2 Score 0 4 6 6 4   Altered sleeping  3 3 1 3   Tired, decreased energy  3 3 3 3   Change in appetite  1 2 2 1   Feeling bad or failure about yourself   3 3 2 2   Trouble concentrating  2 3 2 3   Moving slowly or fidgety/restless  0 0 3 3  Suicidal thoughts  0 0 0 0  PHQ-9 Score  16 20 19 19   Difficult doing work/chores  Somewhat difficult Extremely dIfficult Extremely dIfficult Extremely dIfficult   .[name]    06/03/2022    2:55 PM 01/05/2022    3:16 PM 12/08/2021    3:09 PM 07/01/2021    1:42 PM  GAD 7 : Generalized Anxiety Score  Nervous, Anxious, on Edge 3 3 3 3   Control/stop worrying 3 3 3 3   Worry too much - different things 3 3 3 3   Trouble relaxing 3 3 3 3   Restless 1 0 2 3  Easily annoyed or irritable 1 2 2 2   Afraid - awful might happen 1 2 1 3   Total GAD 7 Score 15 16 17 20   Anxiety Difficulty Extremely difficult Extremely difficult Extremely difficult Extremely difficult      .[name] Hearing Screening   500Hz  1000Hz  2000Hz  3000Hz  4000Hz   Right ear 0 40 20 40 Pass  Left ear 0 25 40 40 Pass     Physical Exam Constitutional:      Appearance: Normal appearance. She is obese.  HENT:     Head: Normocephalic.     Right Ear: Tympanic membrane, ear canal and external ear normal. There is no impacted cerumen.     Left Ear: Tympanic membrane, ear canal and external ear normal.  There is no impacted cerumen.  Cardiovascular:     Rate and Rhythm: Normal rate and regular rhythm.  Pulmonary:     Effort: Pulmonary effort is normal.  Skin:    Comments: Erythematous and red umbilical without discharge  Neurological:     General: No focal deficit present.     Mental Status: She is alert and oriented to person, place, and time.  Psychiatric:        Mood and Affect: Mood normal.           Assessment &  Plan:  ..Sarah Mayer was seen today for rash.  Diagnoses and all orders for this visit:  Open wound of umbilical region, initial encounter -     clindamycin (CLINDAGEL) 1 % gel; Apply topically 2 (two) times daily.  Hearing difficulty, left -     Ambulatory referral to Audiology  GAD (generalized anxiety disorder) -     propranolol (INDERAL) 10 MG tablet; Take 1 tablet (10 mg total) by mouth 3 (three) times daily.  Morbidly obese (HCC) -     WEGOVY 0.25 MG/0.5ML SOAJ; Inject 0.25 mg into the skin once a week. Use this dose for 1 month (4 shots) and then increase to next higher dose. -     WEGOVY 0.5 MG/0.5ML SOAJ; Inject 0.5 mg into the skin once a week. Use this dose for 1 month (4 shots) and then increase to next higher dose. -     WEGOVY 1 MG/0.5ML SOAJ; Inject 1 mg into the skin once a week. Use this dose for 1 month (4 shots) and then increase to next higher dose.  Hearing loss of left ear, unspecified hearing loss type -     Ambulatory referral to Audiology   Clindamycin for umbilical infection for 7 days.  Keep clean and dry.   Referral for audiology.  Hearing less in office abnormal.   Restart wegovy for weight loss.  Pt has been on before.   Trial of propranolol as needed for anxiety at her request.   Follow up in 4 weeks.   [name], PA-C

## 2022-07-01 NOTE — Patient Instructions (Addendum)
Trial of buspar 30mg  twice a day before propranolol Start wegovy Clindamycin gel for wound infection Hearing changes will make referral

## 2022-07-03 DIAGNOSIS — S31105A Unspecified open wound of abdominal wall, periumbilic region without penetration into peritoneal cavity, initial encounter: Secondary | ICD-10-CM | POA: Insufficient documentation

## 2022-07-10 ENCOUNTER — Encounter: Payer: Self-pay | Admitting: Physician Assistant

## 2022-07-16 ENCOUNTER — Encounter: Payer: Self-pay | Admitting: Physician Assistant

## 2022-07-17 ENCOUNTER — Other Ambulatory Visit: Payer: Self-pay | Admitting: Physician Assistant

## 2022-07-17 DIAGNOSIS — M7918 Myalgia, other site: Secondary | ICD-10-CM

## 2022-07-17 DIAGNOSIS — L4050 Arthropathic psoriasis, unspecified: Secondary | ICD-10-CM

## 2022-07-17 DIAGNOSIS — F410 Panic disorder [episodic paroxysmal anxiety] without agoraphobia: Secondary | ICD-10-CM

## 2022-07-17 DIAGNOSIS — F339 Major depressive disorder, recurrent, unspecified: Secondary | ICD-10-CM

## 2022-07-17 DIAGNOSIS — G43011 Migraine without aura, intractable, with status migrainosus: Secondary | ICD-10-CM

## 2022-07-21 NOTE — Telephone Encounter (Signed)
LVM for pt to call and schedule with Jade.

## 2022-07-21 NOTE — Telephone Encounter (Signed)
Schedule Thayer Ohm on [place] schedule.

## 2022-08-03 LAB — HM MAMMOGRAPHY

## 2022-08-04 ENCOUNTER — Ambulatory Visit: Payer: BLUE CROSS/BLUE SHIELD

## 2022-08-05 ENCOUNTER — Encounter: Payer: Self-pay | Admitting: Physician Assistant

## 2022-08-05 DIAGNOSIS — L4050 Arthropathic psoriasis, unspecified: Secondary | ICD-10-CM

## 2022-08-05 DIAGNOSIS — F411 Generalized anxiety disorder: Secondary | ICD-10-CM

## 2022-08-05 DIAGNOSIS — G894 Chronic pain syndrome: Secondary | ICD-10-CM

## 2022-08-07 ENCOUNTER — Telehealth: Payer: BLUE CROSS/BLUE SHIELD

## 2022-08-07 MED ORDER — HYDROCODONE-ACETAMINOPHEN 10-325 MG PO TABS
1.0000 | ORAL_TABLET | Freq: Two times a day (BID) | ORAL | 0 refills | Status: AC
Start: 2022-08-07 — End: 2022-09-06

## 2022-08-07 NOTE — Telephone Encounter
Office Visit Price Quote Request    Appointment Type: Return     Date/Time Requested (If any): 08/13/22    MD (If any): Dr.Levins    ICD-10/CPT Codes: Growth on shin    Patient or caller has been notified of the turnaround time of 1-2 business day(s).     Pt [place] out of state and switched insurance to an HMO. FCU advised pt to contact clinic to obtain pricing for visit. Please assist, thank you.

## 2022-08-07 NOTE — Telephone Encounter (Signed)
..  PDMP reviewed during this encounter. No concerns.  Refill sent.  

## 2022-08-08 ENCOUNTER — Other Ambulatory Visit: Payer: Self-pay | Admitting: Physician Assistant

## 2022-08-08 DIAGNOSIS — L089 Local infection of the skin and subcutaneous tissue, unspecified: Secondary | ICD-10-CM

## 2022-08-10 MED ORDER — PROPRANOLOL HCL 10 MG PO TABS
10.0000 mg | ORAL_TABLET | Freq: Three times a day (TID) | ORAL | 1 refills | Status: DC
Start: 2022-08-10 — End: 2022-09-29

## 2022-08-10 NOTE — Telephone Encounter
Patient already scheduled on 06/20 to be seen

## 2022-08-11 ENCOUNTER — Encounter: Payer: Self-pay | Admitting: Physician Assistant

## 2022-08-13 ENCOUNTER — Ambulatory Visit: Payer: BLUE CROSS/BLUE SHIELD

## 2022-08-15 ENCOUNTER — Other Ambulatory Visit: Payer: Self-pay | Admitting: Physician Assistant

## 2022-08-15 DIAGNOSIS — Z86711 Personal history of pulmonary embolism: Secondary | ICD-10-CM

## 2022-08-18 NOTE — Telephone Encounter (Signed)
Requesting rx rf of Eliquis 5mg   Last written 02/012024 Last OV 07/01/2022 Upcoming appt schld 10/05/22.

## 2022-08-19 ENCOUNTER — Other Ambulatory Visit: Payer: Self-pay | Admitting: Family Medicine

## 2022-08-19 DIAGNOSIS — M7918 Myalgia, other site: Secondary | ICD-10-CM

## 2022-09-02 ENCOUNTER — Ambulatory Visit: Payer: 59 | Admitting: Physician Assistant

## 2022-09-12 ENCOUNTER — Other Ambulatory Visit: Payer: Self-pay | Admitting: Physician Assistant

## 2022-09-12 ENCOUNTER — Other Ambulatory Visit: Payer: Self-pay | Admitting: Family Medicine

## 2022-09-12 DIAGNOSIS — G43011 Migraine without aura, intractable, with status migrainosus: Secondary | ICD-10-CM

## 2022-09-14 ENCOUNTER — Encounter: Payer: Self-pay | Admitting: Physician Assistant

## 2022-09-18 MED ORDER — ONDANSETRON HCL 8 MG PO TABS: 16.0000 mg | ORAL_TABLET | Freq: Three times a day (TID) | ORAL | 0 refills | Status: DC | PRN

## 2022-09-18 NOTE — Addendum Note (Signed)
Addended by: Jomarie Longs on: 09/18/2022 02:47 PM   Modules accepted: Orders

## 2022-09-21 ENCOUNTER — Telehealth: Payer: Self-pay | Admitting: Physician Assistant

## 2022-09-21 DIAGNOSIS — M170 Bilateral primary osteoarthritis of knee: Secondary | ICD-10-CM

## 2022-09-21 NOTE — Telephone Encounter (Signed)
Patient calling to request that referral for aquatic physical therapy be sent to the location in [place] instead if possible. Patient provided fax number of [phone].

## 2022-09-27 ENCOUNTER — Other Ambulatory Visit: Payer: Self-pay | Admitting: Physician Assistant

## 2022-09-27 DIAGNOSIS — F411 Generalized anxiety disorder: Secondary | ICD-10-CM

## 2022-10-01 ENCOUNTER — Encounter: Payer: Self-pay | Admitting: Physician Assistant

## 2022-10-01 DIAGNOSIS — M25561 Pain in right knee: Secondary | ICD-10-CM

## 2022-10-01 DIAGNOSIS — L4050 Arthropathic psoriasis, unspecified: Secondary | ICD-10-CM

## 2022-10-02 MED ORDER — HYDROCODONE-ACETAMINOPHEN 5-325 MG PO TABS: 1.0000 | ORAL_TABLET | Freq: Two times a day (BID) | ORAL | 0 refills | Status: DC

## 2022-10-02 MED ORDER — BUTALBITAL-APAP-CAFFEINE 50-325-40 MG PO TABS: ORAL_TABLET | ORAL | 0 refills | Status: DC

## 2022-10-05 ENCOUNTER — Ambulatory Visit: Payer: 59 | Admitting: Physician Assistant

## 2022-10-07 ENCOUNTER — Encounter: Payer: Self-pay | Admitting: Physician Assistant

## 2022-10-07 NOTE — Telephone Encounter (Signed)
Attempted to contact patient to schedule a nurse visit . Left message requesting a return call.

## 2022-10-12 ENCOUNTER — Encounter: Payer: Self-pay | Admitting: Physician Assistant

## 2022-10-12 ENCOUNTER — Ambulatory Visit (INDEPENDENT_AMBULATORY_CARE_PROVIDER_SITE_OTHER): Payer: 59 | Admitting: Physician Assistant

## 2022-10-12 VITALS — BP 101/86 | HR 71 | Ht 60.0 in | Wt 259.0 lb

## 2022-10-12 DIAGNOSIS — M160 Bilateral primary osteoarthritis of hip: Secondary | ICD-10-CM

## 2022-10-12 DIAGNOSIS — M720 Palmar fascial fibromatosis [Dupuytren]: Secondary | ICD-10-CM

## 2022-10-12 DIAGNOSIS — Z79899 Other long term (current) drug therapy: Secondary | ICD-10-CM

## 2022-10-12 DIAGNOSIS — K219 Gastro-esophageal reflux disease without esophagitis: Secondary | ICD-10-CM | POA: Diagnosis not present

## 2022-10-12 DIAGNOSIS — E66813 Obesity, class 3: Secondary | ICD-10-CM

## 2022-10-12 DIAGNOSIS — R600 Localized edema: Secondary | ICD-10-CM

## 2022-10-12 DIAGNOSIS — L4050 Arthropathic psoriasis, unspecified: Secondary | ICD-10-CM | POA: Diagnosis not present

## 2022-10-12 DIAGNOSIS — Z86711 Personal history of pulmonary embolism: Secondary | ICD-10-CM

## 2022-10-12 DIAGNOSIS — F419 Anxiety disorder, unspecified: Secondary | ICD-10-CM

## 2022-10-12 DIAGNOSIS — R1013 Epigastric pain: Secondary | ICD-10-CM

## 2022-10-12 DIAGNOSIS — R29898 Other symptoms and signs involving the musculoskeletal system: Secondary | ICD-10-CM

## 2022-10-12 DIAGNOSIS — R300 Dysuria: Secondary | ICD-10-CM | POA: Diagnosis not present

## 2022-10-12 DIAGNOSIS — G894 Chronic pain syndrome: Secondary | ICD-10-CM

## 2022-10-12 DIAGNOSIS — F339 Major depressive disorder, recurrent, unspecified: Secondary | ICD-10-CM | POA: Diagnosis not present

## 2022-10-12 DIAGNOSIS — N3001 Acute cystitis with hematuria: Secondary | ICD-10-CM

## 2022-10-12 LAB — POCT URINALYSIS DIP (CLINITEK)
Glucose, UA: NEGATIVE mg/dL
Nitrite, UA: POSITIVE — AB
POC PROTEIN,UA: 100 — AB
Spec Grav, UA: >=1.03 — AB (ref 1.010–1.025)
Urobilinogen, UA: 0.2 E.U./dL
pH, UA: 6 (ref 5.0–8.0)

## 2022-10-12 MED ORDER — RABEPRAZOLE SODIUM 20 MG PO TBEC
20.0000 mg | DELAYED_RELEASE_TABLET | Freq: Two times a day (BID) | ORAL | 3 refills | Status: DC
Start: 2022-10-12 — End: 2023-11-02

## 2022-10-12 MED ORDER — PROPRANOLOL HCL 20 MG PO TABS
20.0000 mg | ORAL_TABLET | Freq: Three times a day (TID) | ORAL | 1 refills | Status: DC
Start: 2022-10-12 — End: 2023-01-13

## 2022-10-12 MED ORDER — BUPROPION HCL ER (XL) 150 MG PO TB24
150.0000 mg | ORAL_TABLET | Freq: Every morning | ORAL | 1 refills | Status: DC
Start: 2022-10-12 — End: 2022-12-01

## 2022-10-12 MED ORDER — ELIQUIS 5 MG PO TABS
5.0000 mg | ORAL_TABLET | Freq: Two times a day (BID) | ORAL | 3 refills | Status: DC
Start: 2022-10-12 — End: 2023-11-04

## 2022-10-12 MED ORDER — DULOXETINE HCL 20 MG PO CPEP
20.0000 mg | ORAL_CAPSULE | Freq: Every day | ORAL | 1 refills | Status: DC
Start: 2022-10-12 — End: 2023-05-04

## 2022-10-12 MED ORDER — NITROFURANTOIN MONOHYD MACRO 100 MG PO CAPS: 100.0000 mg | ORAL_CAPSULE | Freq: Two times a day (BID) | ORAL | 0 refills | Status: DC

## 2022-10-12 NOTE — Progress Notes (Signed)
Established Patient Office Visit  Subjective   Patient ID: Sarah Mayer, female    DOB: 1970-11-30  Age: 52 y.o. MRN: [id number]  Chief Complaint  Patient presents with   Medical Management of Chronic Issues    Follow up on pain and swelling     HPI Pt is a 52 yo female who presents to the clinic for follow up.   Needs new wheelchair that is smaller due to weight loss so that she can get through doorway and ambulate around house.   Pt has bilateral lower ext edema and wonders about a fluid pill to help.   Propranol helping a little with anxiety but would like something else or more help.   Pt has more problems with left hand and contracture. Needs PT.concerned about function.   Pt is concerned about pancreatitis due to being on saxenda. She is having some dull left upper quadrant pain. She continues to lose weight. She has lost 60lbs. Known gastroparesis and chronic nausea but does not want to do procedure or stop saxenda.   Pt feels like she has a UTI. Urinary symptoms for the last few days. Denies any fever, chills, flank pain. Not tried anything to make better. Hx of UTI.   She has not had her eliquis for a week and concerned she could have blood clots. She wants to know if there is any test we can run to reassure her that she does not have blood clots.    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   COVID-19 long hauler 02/18/2021   Cough, persistent 06/24/2020   DDD (degenerative disc  disease), lumbar 01/23/2020   Osteoarthritis, hip, bilateral 01/23/2020   Intertrigo 08/11/2019   Melasma 08/11/2019   OAB (overactive bladder) 08/11/2019   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   Chronic nonintractable headache 02/07/2019   Bilateral leg weakness 01/23/2019   Skin infection 01/23/2019   Bilateral leg pain 11/14/2018   Dry mouth 11/14/2018   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   Jerking 09/12/2018   Episode of confusion 09/12/2018   Chronic left shoulder pain 03/15/2018   Chronic pain of left thumb 03/15/2018   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   Urinary frequency 02/14/2018   Weakness 02/11/2018   Recurrent falls 12/29/2017   IBS (irritable bowel syndrome) 12/03/2017   Myofascial pain 08/19/2017   Itching 08/19/2017   Yeast infection 08/19/2017   Easy bruising 07/26/2017   Psoriasis 07/06/2017   CFS (chronic fatigue syndrome) 07/06/2017   Edema of both feet 07/04/2017   B12 deficiency 07/04/2017   Vitamin D deficiency 07/04/2017   Secondary hyperparathyroidism, non-renal (HCC) 07/04/2017   Word finding difficulty 03/15/2017   Intractable migraine without aura and with status migrainosus 03/15/2017   Numbness and tingling in left arm 01/17/2017   Tripping over things 01/12/2017   Left arm pain 01/12/2017   Left elbow pain 01/12/2017   Twitching 12/22/2016   Upper airway cough syndrome 12/22/2016   Bilateral hip pain 12/22/2016   Memory changes 11/23/2016   Major depressive disorder with current active  episode 11/23/2016   OSA (obstructive sleep apnea) 11/23/2016   Collagenous colitis 10/18/2016   Barrett's esophagus without dysplasia 10/18/2016   Arthralgia 10/18/2016   Serum calcium elevated 08/30/2016   Diarrhea 08/30/2016   Morbidly obese (HCC) 05/27/2016   Chronic pain syndrome 05/25/2016   History of pulmonary embolus (PE) 03/27/2016   History of DVT (deep vein thrombosis) 03/27/2016   Chronic tension-type headache, not intractable 03/27/2016   Primary insomnia  03/27/2016   Bilateral lower extremity edema 03/27/2016   Primary and psoriatic osteoarthritis of both knees 03/24/2016   Gastroesophageal reflux disease 03/24/2016   GAD (generalized anxiety disorder) 03/24/2016   Iron deficiency anemia 03/24/2016   Past Medical History:  Diagnosis Date   Chronic pain syndrome 05/25/2016   Gastroesophageal reflux disease 03/24/2016   History of DVT (deep vein thrombosis) 03/27/2016   History of pulmonary embolism 03/27/2016   IBS (irritable bowel syndrome)    Microscopic colitis    Migraine    Morbid obesity (HCC) 05/27/2016   Psoriatic arthritis (HCC)    Past Surgical History:  Procedure Laterality Date   COLONOSCOPY W/ BIOPSIES     ESOPHAGOGASTRODUODENOSCOPY     Family History  Problem Relation Age of Onset   Hypertension Mother    Hyperlipidemia Mother    Diabetes Mother    Dementia Father    Allergies  Allergen Reactions   Imitrex [Sumatriptan]     Dizziness/somulence/nausea   Methotrexate Derivatives     Hair fall out   Moxifloxacin Hcl In Nacl Rash   Nsaids     IRRITATES "COLITIS"   Topamax [Topiramate]     rash   Remeron [Mirtazapine]     Hair fall out.    Seroquel [Quetiapine]     Hallucinations worsened   Clarithromycin     Unable to remember   Penicillins Rash   Sulfa Antibiotics Rash   Sumatriptan Succinate Nausea And Vomiting    19 Jan 2011     ROS   See HPI.  Objective:     BP 101/86   Pulse 71   Ht 5' (1.524 m)   Wt 259 lb (117.5 kg)   SpO2 99%   BMI 50.58 kg/m  BP Readings from Last 3 Encounters:  10/12/22 101/86  07/01/22 (!) 118/92  06/03/22 131/89   Wt Readings from Last 3 Encounters:  10/12/22 259 lb (117.5 kg)  06/03/22 245 lb (111.1 kg)  09/26/21 (!) 304 lb (137.9 kg)    .[name] Results for orders placed or performed in visit on 10/12/22  Urine Culture   Specimen: Urine   Urine  Result Value Ref Range   Urine Culture, Routine Final report (A)    Organism ID, Bacteria Escherichia coli (A)     ORGANISM ID, BACTERIA Not applicable    Antimicrobial Susceptibility Comment   Lipase  Result Value Ref Range   Lipase 13 (L) 14 - 72 U/L  CMP14+EGFR  Result Value Ref Range   Glucose 94 70 - 99 mg/dL   BUN 7 6 - 24 mg/dL   Creatinine, Ser 0.98 0.57 - 1.00 mg/dL   eGFR 72 >11 BJ/YNW/2.95   BUN/Creatinine Ratio 7 (L) 9 - 23   Sodium 141 134 - 144 mmol/L   Potassium 3.8 3.5 - 5.2 mmol/L   Chloride 101 96 - 106 mmol/L   CO2 21 20 - 29 mmol/L   Calcium 10.3 (H) 8.7 - 10.2 mg/dL   Total Protein 6.6 6.0 - 8.5 g/dL   Albumin 4.4  3.8 - 4.9 g/dL   Globulin, Total 2.2 1.5 - 4.5 g/dL   Bilirubin Total 0.6 0.0 - 1.2 mg/dL   Alkaline Phosphatase 105 44 - 121 IU/L   AST 12 0 - 40 IU/L   ALT 12 0 - 32 IU/L  D-dimer, quantitative  Result Value Ref Range   D-DIMER <0.20 0.00 - 0.49 mg/L FEU  Specimen status report  Result Value Ref Range   specimen status report Comment   POCT URINALYSIS DIP (CLINITEK)  Result Value Ref Range   Color, UA yellow yellow   Clarity, UA cloudy (A) clear   Glucose, UA negative negative mg/dL   Bilirubin, UA small (A) negative   Ketones, POC UA small (15) (A) negative mg/dL   Spec Grav, UA >=1.610 (A) 1.010 - 1.025   Blood, UA small (A) negative   pH, UA 6.0 5.0 - 8.0   POC PROTEIN,UA =100 (A) negative, trace   Urobilinogen, UA 0.2 0.2 or 1.0 E.U./dL   Nitrite, UA Positive (A) Negative   Leukocytes, UA Moderate (2+) (A) Negative     Physical Exam Constitutional:      Appearance: Normal appearance. She is obese.  HENT:     Head: Normocephalic.  Cardiovascular:     Rate and Rhythm: Normal rate and regular rhythm.     Pulses: Normal pulses.     Heart sounds: Normal heart sounds.  Pulmonary:     Effort: Pulmonary effort is normal.     Breath sounds: Normal breath sounds.  Abdominal:     Tenderness: There is abdominal tenderness. There is no right CVA tenderness or left CVA tenderness.     Comments: Lower abdominal tenderness  Musculoskeletal:      Right lower leg: Edema present.     Left lower leg: Edema present.     Comments: Bilateral hands with fused joints not able to open and shut appropriately   Left hand fibrous tendon pulling on ring finger  Neurological:     General: No focal deficit present.     Mental Status: She is alert and oriented to person, place, and time.  Psychiatric:        Mood and Affect: Mood normal.        The 10-year ASCVD risk score (Arnett DK, et al., 2019) is: 0.7%    Assessment & Plan:  Marland KitchenMarland KitchenAalyha was seen today for medical management of chronic issues.  Diagnoses and all orders for this visit:  Chronic pain syndrome  Depression, recurrent (HCC) -     buPROPion (WELLBUTRIN XL) 150 MG 24 hr tablet; Take 1 tablet (150 mg total) by mouth every morning.  Psoriatic arthritis (HCC) -     DULoxetine (CYMBALTA) 20 MG capsule; Take 1 capsule (20 mg total) by mouth daily. -     Ambulatory referral to Physical Therapy  History of pulmonary embolus (PE) -     ELIQUIS 5 MG TABS tablet; Take 1 tablet (5 mg total) by mouth 2 (two) times daily. -     D-dimer, quantitative  Morbidly obese (HCC)  Gastroesophageal reflux disease without esophagitis -     RABEprazole (ACIPHEX) 20 MG tablet; Take 1 tablet (20 mg total) by mouth 2 (two) times daily.  Epigastric pain -     Lipase  Medication management -     CMP14+EGFR  Bilateral leg weakness  Bilateral lower extremity edema  Primary osteoarthritis of both hips  Class 3 severe obesity due to excess calories with serious comorbidity and body  mass index (BMI) of 50.0 to 59.9 in adult [facility])  Dysuria -     POCT URINALYSIS DIP (CLINITEK) -     Urine Culture  Acute cystitis with hematuria -     nitrofurantoin, macrocrystal-monohydrate, (MACROBID) 100 MG capsule; Take 1 capsule (100 mg total) by mouth 2 (two) times daily. -     Specimen status report  Dupuytren's contracture of left hand -     Ambulatory referral to Physical Therapy  Anxiety -      propranolol (INDERAL) 20 MG tablet; Take 1 tablet (20 mg total) by mouth 3 (three) times daily. As needed for anxiety.  UA positive for signs of infection  Sent macrobid  Increased propranolol for anxiety up to three times a day  PT ordered for hand and to continue pool therapy for bilateral leg weakness Use compression stockings Hold diuretic due to low BP  Cut back on saxenda 1.2mg  Will check lipase  Restart eliquis D-dimer ordered for screening for blood clot  Refilled medications for mood.      Return in about 3 months (around 01/12/2023).    [name], PA-C

## 2022-10-12 NOTE — Patient Instructions (Addendum)
Pool [facility] for bilateral strength left hand.  Will get labs Please start using compression stockings Hold on diuretic because of low BP Cut back on saxenda 1.2mg 

## 2022-10-13 LAB — CMP14+EGFR
ALT: 12 IU/L (ref 0–32)
AST: 12 IU/L (ref 0–40)
Albumin: 4.4 g/dL (ref 3.8–4.9)
Alkaline Phosphatase: 105 IU/L (ref 44–121)
BUN/Creatinine Ratio: 7 — ABNORMAL LOW (ref 9–23)
BUN: 7 mg/dL (ref 6–24)
Bilirubin Total: 0.6 mg/dL (ref 0.0–1.2)
CO2: 21 mmol/L (ref 20–29)
Calcium: 10.3 mg/dL — ABNORMAL HIGH (ref 8.7–10.2)
Chloride: 101 mmol/L (ref 96–106)
Creatinine, Ser: 0.96 mg/dL (ref 0.57–1.00)
Globulin, Total: 2.2 g/dL (ref 1.5–4.5)
Glucose: 94 mg/dL (ref 70–99)
Potassium: 3.8 mmol/L (ref 3.5–5.2)
Sodium: 141 mmol/L (ref 134–144)
Total Protein: 6.6 g/dL (ref 6.0–8.5)
eGFR: 72 mL/min/{1.73_m2} (ref >59)

## 2022-10-13 LAB — D-DIMER, QUANTITATIVE: D-DIMER: <0.2 mg{FEU}/L (ref 0.00–0.49)

## 2022-10-13 LAB — LIPASE: Lipase: 13 U/L — ABNORMAL LOW (ref 14–72)

## 2022-10-13 NOTE — Progress Notes (Signed)
Normal D-Dimer. Do not need to do any further testing for clots.

## 2022-10-13 NOTE — Progress Notes (Signed)
Sarah Mayer,   No signs of pancreatitis, with low lipase.  D-dimer still pending.

## 2022-10-14 LAB — URINE CULTURE

## 2022-10-14 LAB — SPECIMEN STATUS REPORT

## 2022-10-14 NOTE — Progress Notes (Signed)
E.coli found in urine and macrobid should treat!

## 2022-10-15 NOTE — Telephone Encounter (Signed)
10/15/2022-left message on patients voicemail requesting more information regarding request for aquatic physical therapy program.  Unable to locate orders. Please advise.

## 2022-10-18 ENCOUNTER — Encounter: Payer: Self-pay | Admitting: Physician Assistant

## 2022-10-20 MED ORDER — BUDESONIDE 3 MG PO CPEP: 9.0000 mg | ORAL_CAPSULE | Freq: Every day | ORAL | 0 refills | Status: DC

## 2022-10-22 NOTE — Addendum Note (Signed)
Addended by: Ernest Mallick A on: 10/22/2022 11:48 AM   Modules accepted: Orders

## 2022-10-23 NOTE — Addendum Note (Signed)
Addended by: Ernest Mallick A on: 10/23/2022 01:21 PM   Modules accepted: Orders

## 2022-10-25 ENCOUNTER — Other Ambulatory Visit: Payer: Self-pay | Admitting: Physician Assistant

## 2022-10-25 DIAGNOSIS — F329 Major depressive disorder, single episode, unspecified: Secondary | ICD-10-CM

## 2022-10-25 DIAGNOSIS — F410 Panic disorder [episodic paroxysmal anxiety] without agoraphobia: Secondary | ICD-10-CM

## 2022-10-28 ENCOUNTER — Encounter: Payer: Self-pay | Admitting: Physician Assistant

## 2022-10-29 ENCOUNTER — Encounter: Payer: Self-pay | Admitting: Physician Assistant

## 2022-10-29 ENCOUNTER — Other Ambulatory Visit: Payer: Self-pay

## 2022-10-29 DIAGNOSIS — G43011 Migraine without aura, intractable, with status migrainosus: Secondary | ICD-10-CM

## 2022-10-29 MED ORDER — ONDANSETRON 8 MG PO TBDP: ORAL_TABLET | ORAL | 0 refills | Status: DC

## 2022-10-29 NOTE — Telephone Encounter (Signed)
10/29/2022-Left message on patients voicemail notifying patient that referral has been sent to [facility].

## 2022-10-29 NOTE — Telephone Encounter (Signed)
Referral, clinical notes, demographics and copies of insurance cards have been faxed to [facility] at [phone]. Office will contact patient to schedule referral..

## 2022-11-02 ENCOUNTER — Other Ambulatory Visit: Payer: Self-pay | Admitting: Physician Assistant

## 2022-11-02 ENCOUNTER — Encounter: Payer: Self-pay | Admitting: Physician Assistant

## 2022-11-02 DIAGNOSIS — M25561 Pain in right knee: Secondary | ICD-10-CM

## 2022-11-02 DIAGNOSIS — M7918 Myalgia, other site: Secondary | ICD-10-CM

## 2022-11-02 DIAGNOSIS — L4050 Arthropathic psoriasis, unspecified: Secondary | ICD-10-CM

## 2022-11-02 MED ORDER — BUTALBITAL-APAP-CAFFEINE 50-325-40 MG PO TABS: ORAL_TABLET | ORAL | 0 refills | Status: DC

## 2022-11-02 NOTE — Telephone Encounter (Signed)
Reqeuesting rx rfo hydrocone  Last written 09/05/2021 but not showing in patient active medication list.  Last OV 10/12/2022 Upcoming appt 01/12/2023  Also requesting rxr f of fioricet Last written 08 09 2024

## 2022-11-03 MED ORDER — BUTALBITAL-APAP-CAFFEINE 50-325-40 MG PO TABS: ORAL_TABLET | ORAL | 0 refills | Status: DC

## 2022-11-03 MED ORDER — HYDROCODONE-ACETAMINOPHEN 5-325 MG PO TABS: 1.0000 | ORAL_TABLET | Freq: Two times a day (BID) | ORAL | 0 refills | Status: DC

## 2022-11-03 NOTE — Telephone Encounter (Signed)
..  PDMP reviewed during this encounter. Norco to [employer] to [employer]

## 2022-11-03 NOTE — Addendum Note (Signed)
Addended by: Jomarie Longs on: 11/03/2022 03:55 PM   Modules accepted: Orders

## 2022-11-03 NOTE — Addendum Note (Signed)
Addended by: Chalmers Cater on: 11/03/2022 01:25 PM   Modules accepted: Orders

## 2022-11-04 ENCOUNTER — Encounter: Payer: Self-pay | Admitting: Physician Assistant

## 2022-11-10 ENCOUNTER — Other Ambulatory Visit: Payer: Self-pay

## 2022-11-10 DIAGNOSIS — M7918 Myalgia, other site: Secondary | ICD-10-CM

## 2022-11-10 NOTE — Telephone Encounter (Signed)
Patient requesting rx rf of cyclobenzaprine Last written 06/262024 Last OV 10/12/2022 Next scheduled appt 01/12/2023

## 2022-11-11 MED ORDER — CYCLOBENZAPRINE HCL 10 MG PO TABS
ORAL_TABLET | ORAL | 0 refills | Status: DC
Start: 2022-11-11 — End: 2023-02-05

## 2022-11-12 ENCOUNTER — Other Ambulatory Visit: Payer: Self-pay | Admitting: Physician Assistant

## 2022-11-12 DIAGNOSIS — G43011 Migraine without aura, intractable, with status migrainosus: Secondary | ICD-10-CM

## 2022-11-14 ENCOUNTER — Other Ambulatory Visit: Payer: Self-pay | Admitting: Physician Assistant

## 2022-11-14 ENCOUNTER — Other Ambulatory Visit: Payer: Self-pay | Admitting: Family Medicine

## 2022-11-14 DIAGNOSIS — J302 Other seasonal allergic rhinitis: Secondary | ICD-10-CM

## 2022-11-14 DIAGNOSIS — F410 Panic disorder [episodic paroxysmal anxiety] without agoraphobia: Secondary | ICD-10-CM

## 2022-11-25 ENCOUNTER — Ambulatory Visit: Payer: 59 | Admitting: Physician Assistant

## 2022-12-01 ENCOUNTER — Encounter: Payer: Self-pay | Admitting: Physician Assistant

## 2022-12-01 ENCOUNTER — Ambulatory Visit (INDEPENDENT_AMBULATORY_CARE_PROVIDER_SITE_OTHER): Payer: 59 | Admitting: Physician Assistant

## 2022-12-01 VITALS — BP 107/60 | HR 79 | Temp 98.7°F | Ht 60.0 in | Wt 259.0 lb

## 2022-12-01 DIAGNOSIS — F339 Major depressive disorder, recurrent, unspecified: Secondary | ICD-10-CM

## 2022-12-01 DIAGNOSIS — Z23 Encounter for immunization: Secondary | ICD-10-CM | POA: Diagnosis not present

## 2022-12-01 DIAGNOSIS — L281 Prurigo nodularis: Secondary | ICD-10-CM

## 2022-12-01 DIAGNOSIS — S41101A Unspecified open wound of right upper arm, initial encounter: Secondary | ICD-10-CM | POA: Diagnosis not present

## 2022-12-01 DIAGNOSIS — L304 Erythema intertrigo: Secondary | ICD-10-CM

## 2022-12-01 DIAGNOSIS — L4050 Arthropathic psoriasis, unspecified: Secondary | ICD-10-CM | POA: Diagnosis not present

## 2022-12-01 DIAGNOSIS — M25561 Pain in right knee: Secondary | ICD-10-CM | POA: Diagnosis not present

## 2022-12-01 DIAGNOSIS — L299 Pruritus, unspecified: Secondary | ICD-10-CM

## 2022-12-01 MED ORDER — HYDROCODONE-ACETAMINOPHEN 5-325 MG PO TABS: 1.0000 | ORAL_TABLET | Freq: Two times a day (BID) | ORAL | 0 refills | Status: DC

## 2022-12-01 MED ORDER — FLUCONAZOLE 150 MG PO TABS
ORAL_TABLET | ORAL | 0 refills | Status: DC
Start: 2022-12-01 — End: 2022-12-18

## 2022-12-01 MED ORDER — BUTALBITAL-APAP-CAFFEINE 50-325-40 MG PO TABS: ORAL_TABLET | ORAL | 0 refills | Status: DC

## 2022-12-01 MED ORDER — NYSTATIN 100000 UNIT/GM EX POWD: 1.0000 | Freq: Three times a day (TID) | CUTANEOUS | 1 refills | Status: DC

## 2022-12-01 MED ORDER — HYDROXYZINE HCL 50 MG PO TABS
ORAL_TABLET | ORAL | 3 refills | Status: DC
Start: 2022-12-01 — End: 2023-12-16

## 2022-12-01 MED ORDER — BUPROPION HCL ER (XL) 150 MG PO TB24: 150.0000 mg | ORAL_TABLET | Freq: Every morning | ORAL | 1 refills | Status: DC

## 2022-12-01 NOTE — Progress Notes (Signed)
Established Patient Office Visit  Subjective   Patient ID: Sarah Mayer, female    DOB: 1970/08/02  Age: 52 y.o. MRN: [id number]  Chief Complaint  Patient presents with   Medical Management of Chronic Issues    Labs Skin lesion on arm bilat Yeast infection  3wks    HPI Pt is a 52 yo obese female who presents to the clinic for follow up.   She has RA that is not controlled. She is seeing rheumatology and switching her skyrizi to cosentyx. Her hands are almost not functional at all. She has no strength in her legs and almost bound toa wheelchair. She continues to have chronic pain and using norco twice a day. Needs refills.   Pt reports intertrigo flare and need for diflucan and refills on creams.   She has been itching for years really. She needs refills on hydroxyzine. Known prugio nodularis. She has some open wounds on bilateral forearms. They seems to be struggling to heal.    .. Active Ambulatory Problems    Diagnosis Date Noted   Primary and psoriatic osteoarthritis of both knees 03/24/2016   Gastroesophageal reflux disease 03/24/2016   GAD (generalized anxiety disorder) 03/24/2016   Iron deficiency anemia 03/24/2016   History of pulmonary embolus (PE) 03/27/2016   History of DVT (deep vein thrombosis) 03/27/2016   Chronic tension-type headache, not intractable 03/27/2016   Primary insomnia 03/27/2016   Bilateral lower extremity edema 03/27/2016   Chronic pain syndrome 05/25/2016   Morbidly obese (HCC) 05/27/2016   Serum calcium elevated 08/30/2016   Diarrhea 08/30/2016   Collagenous colitis 10/18/2016   Barrett's esophagus without dysplasia 10/18/2016   Arthralgia 10/18/2016   Memory changes 11/23/2016   Major depressive disorder with current active episode 11/23/2016   OSA (obstructive sleep apnea) 11/23/2016   Twitching 12/22/2016   Upper airway cough syndrome 12/22/2016   Bilateral hip pain 12/22/2016   Tripping over things 01/12/2017   Left arm pain  01/12/2017   Left elbow pain 01/12/2017   Numbness and tingling in left arm 01/17/2017   Word finding difficulty 03/15/2017   Intractable migraine without aura and with status migrainosus 03/15/2017   Edema of both feet 07/04/2017   B12 deficiency 07/04/2017   Vitamin D deficiency 07/04/2017   Secondary hyperparathyroidism, non-renal (HCC) 07/04/2017   Psoriasis 07/06/2017   CFS (chronic fatigue syndrome) 07/06/2017   Easy bruising 07/26/2017   Myofascial pain 08/19/2017   Itching 08/19/2017   Yeast infection 08/19/2017   IBS (irritable bowel syndrome) 12/03/2017   Recurrent falls 12/29/2017   Weakness 02/11/2018   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   Urinary frequency 02/14/2018   Chronic left shoulder pain 03/15/2018   Chronic pain of left thumb 03/15/2018   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   Jerking 09/12/2018   Episode of confusion 09/12/2018   Bilateral leg pain 11/14/2018   Dry mouth 11/14/2018   Bilateral leg weakness 01/23/2019   Skin infection 01/23/2019   Chronic nonintractable headache 02/07/2019   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   Intertrigo 08/11/2019   Melasma 08/11/2019   OAB (overactive bladder) 08/11/2019   DDD (degenerative disc disease), lumbar 01/23/2020   Osteoarthritis, hip, bilateral 01/23/2020   Cough, persistent 06/24/2020   COVID-19 long hauler 02/18/2021   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   Hallucinations 07/01/2021   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   Nausea 07/01/2021   Gastroesophageal reflux disease with esophagitis without hemorrhage 07/01/2021   Constipation 10/21/2021   Sternum pain 10/21/2021   Psoriatic arthritis (HCC) 06/03/2022   Open  wound of umbilical region 07/03/2022   Class 3 severe obesity due to excess calories with serious comorbidity and body mass index (BMI) of 50.0 to 59.9 in adult [facility]) 10/12/2022   Resolved Ambulatory Problems    Diagnosis Date Noted   Hyperparathyroidism, primary (HCC) 09/18/2016   Rash and nonspecific skin  eruption 10/18/2016   Acquired hypothyroidism 07/04/2017   Acute bronchitis 02/07/2018   Elevated serum creatinine 11/14/2018   Pneumonia of left upper lobe due to Chlamydia species 08/11/2019   Acute cystitis without hematuria 07/10/2020   Past Medical History:  Diagnosis Date   History of pulmonary embolism 03/27/2016   Microscopic colitis    Migraine    Morbid obesity (HCC) 05/27/2016    ROS See HPI.    Objective:     BP 107/60   Pulse 79   Temp 98.7 F (37.1 C) (Oral)   Ht 5' (1.524 m)   Wt 259 lb (117.5 kg) Comment: pt is unable to stand on scale  SpO2 99%   BMI 50.58 kg/m  BP Readings from Last 3 Encounters:  12/01/22 107/60  10/12/22 101/86  07/01/22 (!) 118/92   Wt Readings from Last 3 Encounters:  12/01/22 259 lb (117.5 kg)  10/12/22 259 lb (117.5 kg)  06/03/22 245 lb (111.1 kg)      Physical Exam Constitutional:      Appearance: Normal appearance. She is obese.     Comments: In wheelchair  HENT:     Head: Normocephalic.  Cardiovascular:     Rate and Rhythm: Normal rate.  Pulmonary:     Effort: Pulmonary effort is normal.  Skin:    Comments: Dime sized crusted wound with some surround new growing tissue, no purulent drainage. Tender only to touch right over scab formation. Right forearm.   Neurological:     General: No focal deficit present.     Mental Status: She is alert.  Psychiatric:        Mood and Affect: Mood normal.      The 10-year ASCVD risk score (Arnett DK, et al., 2019) is: 0.7%    Assessment & Plan:  Marland KitchenMarland KitchenNastasia was seen today for medical management of chronic issues.  Diagnoses and all orders for this visit:  Open wound of right upper arm, initial encounter  Depression, recurrent (HCC) -     buPROPion (WELLBUTRIN XL) 150 MG 24 hr tablet; Take 1 tablet (150 mg total) by mouth every morning.  Psoriatic arthritis (HCC) -     HYDROcodone-acetaminophen (NORCO/VICODIN) 5-325 MG tablet; Take 1 tablet by mouth every 12 (twelve)  hours.  Acute pain of right knee -     HYDROcodone-acetaminophen (NORCO/VICODIN) 5-325 MG tablet; Take 1 tablet by mouth every 12 (twelve) hours.  Intertrigo -     nystatin (MYCOSTATIN/NYSTOP) powder; Apply 1 Application topically 3 (three) times daily. -     fluconazole (DIFLUCAN) 150 MG tablet; Repeat in 48-72 hours if symptoms persist.  Need for influenza vaccination -     Flu vaccine trivalent PF, 6mos and older(Flulaval,Afluria,Fluarix,Fluzone)  Need for COVID-19 vaccine -     Pfizer Comirnaty Covid -19 Vaccine 61yrs and older  Prurigo nodularis  Itching -     hydrOXYzine (ATARAX) 50 MG tablet; Take one to two tablets in the evening for itching.  Other orders -     Discontinue: butalbital-acetaminophen-caffeine (FIORICET) 50-325-40 MG tablet; TAKE ONE TABLET BY MOUTH EVERY 6 HOURS AS NEEDED FOR HEADACHE   Debridement of open sore and placed xeroform dressing  on it to help it heal Increased hydroxyzine for itching  Chronic pain-refilled norco up to twice a day .Marland KitchenPDMP reviewed during this encounter. No concerns Pain contract UTD  Keep follow up with Rheumatology   Sent diflucan and nystain cream for as needed use of yeast in skin folds  Flu and covid vaccines given today  [name], PA-C

## 2022-12-02 MED ORDER — BUTALBITAL-APAP-CAFFEINE 50-325-40 MG PO TABS: ORAL_TABLET | ORAL | 0 refills | Status: DC

## 2022-12-07 MED ORDER — NYSTATIN-TRIAMCINOLONE 100000-0.1 UNIT/GM-% EX OINT: 1.0000 | TOPICAL_OINTMENT | Freq: Two times a day (BID) | CUTANEOUS | 0 refills | Status: DC

## 2022-12-07 NOTE — Addendum Note (Signed)
Addended by: Jomarie Longs on: 12/07/2022 04:04 PM   Modules accepted: Orders

## 2022-12-10 ENCOUNTER — Other Ambulatory Visit: Payer: Self-pay | Admitting: Physician Assistant

## 2022-12-10 DIAGNOSIS — G8929 Other chronic pain: Secondary | ICD-10-CM

## 2022-12-12 ENCOUNTER — Other Ambulatory Visit: Payer: Self-pay | Admitting: Family Medicine

## 2022-12-12 DIAGNOSIS — G43011 Migraine without aura, intractable, with status migrainosus: Secondary | ICD-10-CM

## 2022-12-15 ENCOUNTER — Encounter: Payer: Self-pay | Admitting: Physician Assistant

## 2022-12-15 ENCOUNTER — Other Ambulatory Visit: Payer: Self-pay | Admitting: Physician Assistant

## 2022-12-17 ENCOUNTER — Telehealth: Payer: Self-pay | Admitting: Physician Assistant

## 2022-12-17 NOTE — Telephone Encounter (Signed)
Done

## 2022-12-18 ENCOUNTER — Encounter: Payer: Self-pay | Admitting: Physician Assistant

## 2022-12-18 ENCOUNTER — Telehealth (INDEPENDENT_AMBULATORY_CARE_PROVIDER_SITE_OTHER): Payer: 59 | Admitting: Physician Assistant

## 2022-12-18 ENCOUNTER — Other Ambulatory Visit: Payer: Self-pay | Admitting: Physician Assistant

## 2022-12-18 DIAGNOSIS — B3731 Acute candidiasis of vulva and vagina: Secondary | ICD-10-CM | POA: Diagnosis not present

## 2022-12-18 DIAGNOSIS — J302 Other seasonal allergic rhinitis: Secondary | ICD-10-CM

## 2022-12-18 DIAGNOSIS — J020 Streptococcal pharyngitis: Secondary | ICD-10-CM

## 2022-12-18 DIAGNOSIS — Z20818 Contact with and (suspected) exposure to other bacterial communicable diseases: Secondary | ICD-10-CM

## 2022-12-18 MED ORDER — CEFDINIR 300 MG PO CAPS: 300.0000 mg | ORAL_CAPSULE | Freq: Two times a day (BID) | ORAL | 0 refills | Status: DC

## 2022-12-18 MED ORDER — FLUCONAZOLE 150 MG PO TABS: ORAL_TABLET | ORAL | 0 refills | Status: DC

## 2022-12-18 NOTE — Progress Notes (Signed)
..Virtual Visit via Video Note  I connected with [name] on 12/18/22 at  2:20 PM EDT by a video enabled telemedicine application and verified that I am speaking with the correct person using two identifiers.  Location: Patient: home Provider: clinic  .Marland KitchenParticipating in visit:  Patient: Sarah Mayer Provider: [name] PA-C   I discussed the limitations of evaluation and management by telemedicine and the availability of in person appointments. The patient expressed understanding and agreed to proceed.  History of Present Illness: Pt is a 52 yo obese female who calls into clinic with sore throat that started this morning. Her husband had strep and was dx in office on Monday. She feels like she has a fever but has not checked. She has drainage going down throat. She is taking norco for her sore throat and made it a little better.   .. Active Ambulatory Problems    Diagnosis Date Noted   Primary and psoriatic osteoarthritis of both knees 03/24/2016   Gastroesophageal reflux disease 03/24/2016   GAD (generalized anxiety disorder) 03/24/2016   Iron deficiency anemia 03/24/2016   History of pulmonary embolus (PE) 03/27/2016   History of DVT (deep vein thrombosis) 03/27/2016   Chronic tension-type headache, not intractable 03/27/2016   Primary insomnia 03/27/2016   Bilateral lower extremity edema 03/27/2016   Chronic pain syndrome 05/25/2016   Morbidly obese (HCC) 05/27/2016   Serum calcium elevated 08/30/2016   Diarrhea 08/30/2016   Collagenous colitis 10/18/2016   Barrett's esophagus without dysplasia 10/18/2016   Arthralgia 10/18/2016   Memory changes 11/23/2016   Major depressive disorder with current active episode 11/23/2016   OSA (obstructive sleep apnea) 11/23/2016   Twitching 12/22/2016   Upper airway cough syndrome 12/22/2016   Bilateral hip pain 12/22/2016   Tripping over things 01/12/2017   Left arm pain 01/12/2017   Left elbow pain 01/12/2017   Numbness and  tingling in left arm 01/17/2017   Word finding difficulty 03/15/2017   Intractable migraine without aura and with status migrainosus 03/15/2017   Edema of both feet 07/04/2017   B12 deficiency 07/04/2017   Vitamin D deficiency 07/04/2017   Secondary hyperparathyroidism, non-renal (HCC) 07/04/2017   Psoriasis 07/06/2017   CFS (chronic fatigue syndrome) 07/06/2017   Easy bruising 07/26/2017   Myofascial pain 08/19/2017   Itching 08/19/2017   Yeast infection 08/19/2017   IBS (irritable bowel syndrome) 12/03/2017   Recurrent falls 12/29/2017   Weakness 02/11/2018   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   Urinary frequency 02/14/2018   Chronic left shoulder pain 03/15/2018   Chronic pain of left thumb 03/15/2018   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   Jerking 09/12/2018   Episode of confusion 09/12/2018   Bilateral leg pain 11/14/2018   Dry mouth 11/14/2018   Bilateral leg weakness 01/23/2019   Skin infection 01/23/2019   Chronic nonintractable headache 02/07/2019   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   Intertrigo 08/11/2019   Melasma 08/11/2019   OAB (overactive bladder) 08/11/2019   DDD (degenerative disc disease), lumbar 01/23/2020   Osteoarthritis, hip, bilateral 01/23/2020   Cough, persistent 06/24/2020   COVID-19 long hauler 02/18/2021   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   Hallucinations 07/01/2021   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   Nausea 07/01/2021   Gastroesophageal reflux disease with esophagitis without hemorrhage 07/01/2021   Constipation 10/21/2021   Sternum pain 10/21/2021   Psoriatic arthritis (HCC) 06/03/2022   Open wound of umbilical region 07/03/2022   Class 3 severe obesity due to excess calories with serious comorbidity and body mass index (BMI) of 50.0  to 59.9 in adult [facility]) 10/12/2022   Resolved Ambulatory Problems    Diagnosis Date Noted   Hyperparathyroidism, primary (HCC) 09/18/2016   Rash and nonspecific skin eruption 10/18/2016   Acquired hypothyroidism 07/04/2017    Acute bronchitis 02/07/2018   Elevated serum creatinine 11/14/2018   Pneumonia of left upper lobe due to Chlamydia species 08/11/2019   Acute cystitis without hematuria 07/10/2020   Past Medical History:  Diagnosis Date   History of pulmonary embolism 03/27/2016   Microscopic colitis    Migraine    Morbid obesity (HCC) 05/27/2016       Observations/Objective: Pale ill appearance Dry cough  Assessment and Plan: Diagnoses and all orders for this visit:  Strep pharyngitis -     cefdinir (OMNICEF) 300 MG capsule; Take 1 capsule (300 mg total) by mouth 2 (two) times daily.  Exposure to strep throat  Yeast vaginitis -     fluconazole (DIFLUCAN) 150 MG tablet; Repeat in 48-72 hours if symptoms persist.  Treated for strep pharyngitis with omnicef due to PCN allergy Discussed symptomatic care Diflucan due to history of yeast after abx Follow up as needed or if symptoms persist or worsen    Follow Up Instructions:    I discussed the assessment and treatment plan with the patient. The patient was provided an opportunity to ask questions and all were answered. The patient agreed with the plan and demonstrated an understanding of the instructions.   The patient was advised to call back or seek an in-person evaluation if the symptoms worsen or if the condition fails to improve as anticipated.   [name], PA-C

## 2022-12-21 ENCOUNTER — Encounter: Payer: Self-pay | Admitting: Physician Assistant

## 2022-12-22 MED ORDER — HYDROCOD POLI-CHLORPHE POLI ER 10-8 MG/5ML PO SUER: 5.0000 mL | Freq: Two times a day (BID) | ORAL | 0 refills | Status: DC | PRN

## 2022-12-23 MED ORDER — MUPIROCIN 2 % EX OINT: TOPICAL_OINTMENT | CUTANEOUS | 1 refills | Status: DC

## 2022-12-23 NOTE — Addendum Note (Signed)
Addended by: Jomarie Longs on: 12/23/2022 02:05 PM   Modules accepted: Orders

## 2022-12-23 NOTE — Addendum Note (Signed)
Addended by: Chalmers Cater on: 12/23/2022 02:48 PM   Modules accepted: Orders

## 2022-12-23 NOTE — Addendum Note (Signed)
Addended by: Chalmers Cater on: 12/23/2022 01:38 PM   Modules accepted: Orders

## 2022-12-28 ENCOUNTER — Other Ambulatory Visit: Payer: Self-pay | Admitting: Physician Assistant

## 2022-12-28 DIAGNOSIS — F5101 Primary insomnia: Secondary | ICD-10-CM

## 2023-01-05 ENCOUNTER — Other Ambulatory Visit: Payer: Self-pay | Admitting: Physician Assistant

## 2023-01-05 DIAGNOSIS — G43011 Migraine without aura, intractable, with status migrainosus: Secondary | ICD-10-CM

## 2023-01-06 ENCOUNTER — Encounter: Payer: Self-pay | Admitting: Physician Assistant

## 2023-01-06 NOTE — Telephone Encounter (Signed)
She has an appointment on 01/12/2023

## 2023-01-08 ENCOUNTER — Other Ambulatory Visit: Payer: Self-pay | Admitting: Physician Assistant

## 2023-01-08 DIAGNOSIS — F419 Anxiety disorder, unspecified: Secondary | ICD-10-CM

## 2023-01-08 DIAGNOSIS — F411 Generalized anxiety disorder: Secondary | ICD-10-CM

## 2023-01-12 ENCOUNTER — Telehealth (INDEPENDENT_AMBULATORY_CARE_PROVIDER_SITE_OTHER): Payer: 59 | Admitting: Physician Assistant

## 2023-01-12 DIAGNOSIS — K52831 Collagenous colitis: Secondary | ICD-10-CM

## 2023-01-12 DIAGNOSIS — R058 Other specified cough: Secondary | ICD-10-CM | POA: Diagnosis not present

## 2023-01-12 MED ORDER — PROMETHAZINE-DM 6.25-15 MG/5ML PO SYRP: 5.0000 mL | ORAL_SOLUTION | Freq: Four times a day (QID) | ORAL | 0 refills | Status: DC | PRN

## 2023-01-12 MED ORDER — BENZONATATE 200 MG PO CAPS: 200.0000 mg | ORAL_CAPSULE | Freq: Three times a day (TID) | ORAL | 0 refills | Status: DC | PRN

## 2023-01-12 NOTE — Progress Notes (Unsigned)
..Virtual Visit via Video Note  I connected with [name] on 01/13/23 at  2:00 PM EST by a video enabled telemedicine application and verified that I am speaking with the correct person using two identifiers.  Location: Patient: home Provider: clinic  .Marland KitchenParticipating in visit:  Patient: Sarah Mayer Provider: [name] PA-C   I discussed the limitations of evaluation and management by telemedicine and the availability of in person appointments. The patient expressed understanding and agreed to proceed.  History of Present Illness: Pt is a 52 yo female who calls into the clinic to follow up on continued cough after infection over a week ago. She denies any fever, chills, body aches or SOB. The cough is problematic because her colitis is flaring as well. When she coughs she is having stool incontinence. She was switched from skyrizi to [place] by rheumatology.   .. Active Ambulatory Problems    Diagnosis Date Noted   Primary and psoriatic osteoarthritis of both knees 03/24/2016   Gastroesophageal reflux disease 03/24/2016   GAD (generalized anxiety disorder) 03/24/2016   Iron deficiency anemia 03/24/2016   History of pulmonary embolus (PE) 03/27/2016   History of DVT (deep vein thrombosis) 03/27/2016   Chronic tension-type headache, not intractable 03/27/2016   Primary insomnia 03/27/2016   Bilateral lower extremity edema 03/27/2016   Chronic pain syndrome 05/25/2016   Morbidly obese (HCC) 05/27/2016   Serum calcium elevated 08/30/2016   Diarrhea 08/30/2016   Collagenous colitis 10/18/2016   Barrett's esophagus without dysplasia 10/18/2016   Arthralgia 10/18/2016   Memory changes 11/23/2016   Major depressive disorder with current active episode 11/23/2016   OSA (obstructive sleep apnea) 11/23/2016   Twitching 12/22/2016   Upper airway cough syndrome 12/22/2016   Bilateral hip pain 12/22/2016   Tripping over things 01/12/2017   Left arm pain 01/12/2017   Left elbow  pain 01/12/2017   Numbness and tingling in left arm 01/17/2017   Word finding difficulty 03/15/2017   Intractable migraine without aura and with status migrainosus 03/15/2017   Edema of both feet 07/04/2017   B12 deficiency 07/04/2017   Vitamin D deficiency 07/04/2017   Secondary hyperparathyroidism, non-renal (HCC) 07/04/2017   Psoriasis 07/06/2017   CFS (chronic fatigue syndrome) 07/06/2017   Easy bruising 07/26/2017   Myofascial pain 08/19/2017   Itching 08/19/2017   Yeast infection 08/19/2017   IBS (irritable bowel syndrome) 12/03/2017   Recurrent falls 12/29/2017   Weakness 02/11/2018   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   Urinary frequency 02/14/2018   Chronic left shoulder pain 03/15/2018   Chronic pain of left thumb 03/15/2018   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   Jerking 09/12/2018   Episode of confusion 09/12/2018   Bilateral leg pain 11/14/2018   Dry mouth 11/14/2018   Bilateral leg weakness 01/23/2019   Skin infection 01/23/2019   Chronic nonintractable headache 02/07/2019   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   Intertrigo 08/11/2019   Melasma 08/11/2019   OAB (overactive bladder) 08/11/2019   DDD (degenerative disc disease), lumbar 01/23/2020   Osteoarthritis, hip, bilateral 01/23/2020   Cough, persistent 06/24/2020   COVID-19 long hauler 02/18/2021   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   Hallucinations 07/01/2021   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   Nausea 07/01/2021   Gastroesophageal reflux disease with esophagitis without hemorrhage 07/01/2021   Constipation 10/21/2021   Sternum pain 10/21/2021   Psoriatic arthritis (HCC) 06/03/2022   Open wound of umbilical region 07/03/2022   Class 3 severe obesity due to excess calories with serious comorbidity and body mass index (BMI) of 50.0  to 59.9 in adult [facility]) 10/12/2022   Post-viral cough syndrome 01/13/2023   Resolved Ambulatory Problems    Diagnosis Date Noted   Hyperparathyroidism, primary (HCC) 09/18/2016   Rash and  nonspecific skin eruption 10/18/2016   Acquired hypothyroidism 07/04/2017   Acute bronchitis 02/07/2018   Elevated serum creatinine 11/14/2018   Pneumonia of left upper lobe due to Chlamydia species 08/11/2019   Acute cystitis without hematuria 07/10/2020   Past Medical History:  Diagnosis Date   History of pulmonary embolism 03/27/2016   Microscopic colitis    Migraine    Morbid obesity (HCC) 05/27/2016      Observations/Objective: No acute distress Dry cough  Normal breathing   Assessment and Plan: Marland KitchenMarland KitchenDiagnoses and all orders for this visit:  Post-viral cough syndrome -     benzonatate (TESSALON) 200 MG capsule; Take 1 capsule (200 mg total) by mouth 3 (three) times daily as needed. -     promethazine-dextromethorphan (PROMETHAZINE-DM) 6.25-15 MG/5ML syrup; Take 5 mLs by mouth 4 (four) times daily as needed for cough.  Collagenous colitis    No signs of active infection and need for antibiotic Sent tessalon pearls and promethazine cough syrup Continue OTC cough drops Continue to follow up with GI for colitis Continue to follow up with Rheumatology for RA.   Follow Up Instructions:    I discussed the assessment and treatment plan with the patient. The patient was provided an opportunity to ask questions and all were answered. The patient agreed with the plan and demonstrated an understanding of the instructions.   The patient was advised to call back or seek an in-person evaluation if the symptoms worsen or if the condition fails to improve as anticipated.    [name], PA-C

## 2023-01-12 NOTE — Patient Instructions (Signed)

## 2023-01-13 ENCOUNTER — Encounter: Payer: Self-pay | Admitting: Physician Assistant

## 2023-01-13 DIAGNOSIS — R058 Other specified cough: Secondary | ICD-10-CM | POA: Insufficient documentation

## 2023-01-16 ENCOUNTER — Other Ambulatory Visit: Payer: Self-pay | Admitting: Physician Assistant

## 2023-01-16 DIAGNOSIS — G43011 Migraine without aura, intractable, with status migrainosus: Secondary | ICD-10-CM

## 2023-01-18 ENCOUNTER — Encounter: Payer: Self-pay | Admitting: Physician Assistant

## 2023-01-18 DIAGNOSIS — G43011 Migraine without aura, intractable, with status migrainosus: Secondary | ICD-10-CM

## 2023-01-18 DIAGNOSIS — M25561 Pain in right knee: Secondary | ICD-10-CM

## 2023-01-18 DIAGNOSIS — L4050 Arthropathic psoriasis, unspecified: Secondary | ICD-10-CM

## 2023-01-18 MED ORDER — DICYCLOMINE HCL 20 MG PO TABS: 20.0000 mg | ORAL_TABLET | Freq: Three times a day (TID) | ORAL | 2 refills | Status: DC

## 2023-01-18 MED ORDER — DIVALPROEX SODIUM 250 MG PO DR TAB: 250.0000 mg | DELAYED_RELEASE_TABLET | Freq: Two times a day (BID) | ORAL | 0 refills | Status: DC

## 2023-01-18 MED ORDER — HYDROCODONE-ACETAMINOPHEN 5-325 MG PO TABS: 1.0000 | ORAL_TABLET | Freq: Two times a day (BID) | ORAL | 0 refills | Status: AC

## 2023-01-18 NOTE — Telephone Encounter (Signed)
..  PDMP reviewed during this encounter. No concerns.  3 month appt UTD Refilled norco

## 2023-01-29 ENCOUNTER — Other Ambulatory Visit: Payer: Self-pay | Admitting: Physician Assistant

## 2023-01-29 DIAGNOSIS — F410 Panic disorder [episodic paroxysmal anxiety] without agoraphobia: Secondary | ICD-10-CM

## 2023-01-29 DIAGNOSIS — F329 Major depressive disorder, single episode, unspecified: Secondary | ICD-10-CM

## 2023-01-29 DIAGNOSIS — F5101 Primary insomnia: Secondary | ICD-10-CM

## 2023-01-29 DIAGNOSIS — G43011 Migraine without aura, intractable, with status migrainosus: Secondary | ICD-10-CM

## 2023-02-02 ENCOUNTER — Other Ambulatory Visit: Payer: Self-pay | Admitting: Physician Assistant

## 2023-02-02 DIAGNOSIS — F5101 Primary insomnia: Secondary | ICD-10-CM

## 2023-02-02 DIAGNOSIS — G4733 Obstructive sleep apnea (adult) (pediatric): Secondary | ICD-10-CM

## 2023-02-02 MED ORDER — ZOLPIDEM TARTRATE 10 MG PO TABS: 10.0000 mg | ORAL_TABLET | Freq: Every evening | ORAL | 0 refills | Status: DC | PRN

## 2023-02-04 ENCOUNTER — Other Ambulatory Visit: Payer: Self-pay | Admitting: Physician Assistant

## 2023-02-04 DIAGNOSIS — M7918 Myalgia, other site: Secondary | ICD-10-CM

## 2023-02-04 DIAGNOSIS — G894 Chronic pain syndrome: Secondary | ICD-10-CM

## 2023-02-04 DIAGNOSIS — J302 Other seasonal allergic rhinitis: Secondary | ICD-10-CM

## 2023-02-04 DIAGNOSIS — B3731 Acute candidiasis of vulva and vagina: Secondary | ICD-10-CM

## 2023-02-06 ENCOUNTER — Other Ambulatory Visit: Payer: Self-pay | Admitting: Physician Assistant

## 2023-02-06 DIAGNOSIS — R053 Chronic cough: Secondary | ICD-10-CM

## 2023-02-09 ENCOUNTER — Other Ambulatory Visit: Payer: Self-pay | Admitting: Family Medicine

## 2023-02-26 ENCOUNTER — Other Ambulatory Visit: Payer: Self-pay | Admitting: Physician Assistant

## 2023-02-26 DIAGNOSIS — G8929 Other chronic pain: Secondary | ICD-10-CM

## 2023-02-26 DIAGNOSIS — G43011 Migraine without aura, intractable, with status migrainosus: Secondary | ICD-10-CM

## 2023-02-28 ENCOUNTER — Encounter: Payer: Self-pay | Admitting: Physician Assistant

## 2023-02-28 DIAGNOSIS — M25561 Pain in right knee: Secondary | ICD-10-CM

## 2023-02-28 DIAGNOSIS — L4050 Arthropathic psoriasis, unspecified: Secondary | ICD-10-CM

## 2023-03-01 NOTE — Telephone Encounter (Signed)
 She needs an appointment she was told in October to schedule 26-month follow-up which would be right around January 8.  When she schedules an appointment with Lesly Rubenstein then I can refill her medication until her appointment.

## 2023-03-03 ENCOUNTER — Other Ambulatory Visit: Payer: Self-pay | Admitting: Physician Assistant

## 2023-03-03 DIAGNOSIS — G8929 Other chronic pain: Secondary | ICD-10-CM

## 2023-03-05 ENCOUNTER — Ambulatory Visit (INDEPENDENT_AMBULATORY_CARE_PROVIDER_SITE_OTHER): Payer: No Typology Code available for payment source | Admitting: Physician Assistant

## 2023-03-05 ENCOUNTER — Other Ambulatory Visit: Payer: Self-pay | Admitting: Physician Assistant

## 2023-03-05 ENCOUNTER — Encounter: Payer: Self-pay | Admitting: Physician Assistant

## 2023-03-05 VITALS — BP 134/83 | HR 89 | Ht 60.0 in

## 2023-03-05 DIAGNOSIS — L4050 Arthropathic psoriasis, unspecified: Secondary | ICD-10-CM | POA: Diagnosis not present

## 2023-03-05 DIAGNOSIS — R053 Chronic cough: Secondary | ICD-10-CM

## 2023-03-05 DIAGNOSIS — J209 Acute bronchitis, unspecified: Secondary | ICD-10-CM

## 2023-03-05 DIAGNOSIS — G894 Chronic pain syndrome: Secondary | ICD-10-CM | POA: Diagnosis not present

## 2023-03-05 DIAGNOSIS — R599 Enlarged lymph nodes, unspecified: Secondary | ICD-10-CM | POA: Diagnosis not present

## 2023-03-05 DIAGNOSIS — F419 Anxiety disorder, unspecified: Secondary | ICD-10-CM

## 2023-03-05 MED ORDER — HYDROCODONE-ACETAMINOPHEN 10-325 MG PO TABS: 1.0000 | ORAL_TABLET | Freq: Two times a day (BID) | ORAL | 0 refills | Status: DC

## 2023-03-05 MED ORDER — GABAPENTIN 800 MG PO TABS: 800.0000 mg | ORAL_TABLET | Freq: Three times a day (TID) | ORAL | 1 refills | Status: DC

## 2023-03-06 ENCOUNTER — Other Ambulatory Visit: Payer: Self-pay | Admitting: Physician Assistant

## 2023-03-06 DIAGNOSIS — F5101 Primary insomnia: Secondary | ICD-10-CM

## 2023-03-08 MED ORDER — LEVOCETIRIZINE DIHYDROCHLORIDE 5 MG PO TABS: ORAL_TABLET | ORAL | 3 refills | Status: AC

## 2023-03-10 ENCOUNTER — Other Ambulatory Visit: Payer: Self-pay | Admitting: Physician Assistant

## 2023-03-10 ENCOUNTER — Encounter: Payer: Self-pay | Admitting: Physician Assistant

## 2023-03-10 DIAGNOSIS — G8929 Other chronic pain: Secondary | ICD-10-CM

## 2023-03-10 DIAGNOSIS — R599 Enlarged lymph nodes, unspecified: Secondary | ICD-10-CM | POA: Insufficient documentation

## 2023-03-10 NOTE — Progress Notes (Signed)
 Established Patient Office Visit  Subjective   Patient ID: Sarah Mayer, female    DOB: Sep 27, 1970  Age: 53 y.o. MRN: [id number]  Chief Complaint  Patient presents with   Medical Management of Chronic Issues    Pt is coming in to follow up on medication and report she has swollen gland     HPI Pt is a 53 yo obese female who presents to the clinic for chronic pain medication follow up due to PA.   She continues to see rhuematology and on cosentyx right now. She does not feel like it is helping. Her joints in her hand, her hip and leg are painful. She is taking norco bid most days for pain. She continues to take gabapentin  TID, cymbalta  60mg  bid and flexeril  as needed.   She has a lymph node on her left jaw line that is a little tender. No fever, chills, URI symptoms. She has lymph nodes that come and go.   .. Active Ambulatory Problems    Diagnosis Date Noted   Primary and psoriatic osteoarthritis of both knees 03/24/2016   Gastroesophageal reflux disease 03/24/2016   GAD (generalized anxiety disorder) 03/24/2016   Iron deficiency anemia 03/24/2016   History of pulmonary embolus (PE) 03/27/2016   History of DVT (deep vein thrombosis) 03/27/2016   Chronic tension-type headache, not intractable 03/27/2016   Primary insomnia 03/27/2016   Bilateral lower extremity edema 03/27/2016   Chronic pain syndrome 05/25/2016   Morbidly obese (HCC) 05/27/2016   Serum calcium elevated 08/30/2016   Diarrhea 08/30/2016   Collagenous colitis 10/18/2016   Barrett's esophagus without dysplasia 10/18/2016   Arthralgia 10/18/2016   Memory changes 11/23/2016   Major depressive disorder with current active episode 11/23/2016   OSA (obstructive sleep apnea) 11/23/2016   Twitching 12/22/2016   Upper airway cough syndrome 12/22/2016   Bilateral hip pain 12/22/2016   Tripping over things 01/12/2017   Left arm pain 01/12/2017   Left elbow pain 01/12/2017   Numbness and tingling in left arm  01/17/2017   Word finding difficulty 03/15/2017   Intractable migraine without aura and with status migrainosus 03/15/2017   Edema of both feet 07/04/2017   B12 deficiency 07/04/2017   Vitamin D  deficiency 07/04/2017   Secondary hyperparathyroidism, non-renal (HCC) 07/04/2017   Psoriasis 07/06/2017   CFS (chronic fatigue syndrome) 07/06/2017   Easy bruising 07/26/2017   Myofascial pain 08/19/2017   Itching 08/19/2017   Yeast infection 08/19/2017   IBS (irritable bowel syndrome) 12/03/2017   Recurrent falls 12/29/2017   Weakness 02/11/2018   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   Urinary frequency 02/14/2018   Chronic left shoulder pain 03/15/2018   Chronic pain of left thumb 03/15/2018   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   Jerking 09/12/2018   Episode of confusion 09/12/2018   Bilateral leg pain 11/14/2018   Dry mouth 11/14/2018   Bilateral leg weakness 01/23/2019   Skin infection 01/23/2019   Chronic nonintractable headache 02/07/2019   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   Intertrigo 08/11/2019   Melasma 08/11/2019   OAB (overactive bladder) 08/11/2019   DDD (degenerative disc disease), lumbar 01/23/2020   Osteoarthritis, hip, bilateral 01/23/2020   Cough, persistent 06/24/2020   COVID-19 long hauler 02/18/2021   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   Hallucinations 07/01/2021   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   Nausea 07/01/2021   Gastroesophageal reflux disease with esophagitis without hemorrhage 07/01/2021   Constipation 10/21/2021   Sternum pain 10/21/2021   Psoriatic arthritis (HCC) 06/03/2022   Open wound of umbilical region 07/03/2022  Class 3 severe obesity due to excess calories with serious comorbidity and body mass index (BMI) of 50.0 to 59.9 in adult [facility]) 10/12/2022   Post-viral cough syndrome 01/13/2023   Lymph node enlargement 03/10/2023   Resolved Ambulatory Problems    Diagnosis Date Noted   Hyperparathyroidism, primary (HCC) 09/18/2016   Rash and nonspecific skin  eruption 10/18/2016   Acquired hypothyroidism 07/04/2017   Acute bronchitis 02/07/2018   Elevated serum creatinine 11/14/2018   Pneumonia of left upper lobe due to Chlamydia species 08/11/2019   Acute cystitis without hematuria 07/10/2020   Past Medical History:  Diagnosis Date   History of pulmonary embolism 03/27/2016   Microscopic colitis    Migraine    Morbid obesity (HCC) 05/27/2016     ROS See HPI.    Objective:     BP 134/83   Pulse 89   Ht 5' (1.524 m)   SpO2 99%   BMI 50.58 kg/m  BP Readings from Last 3 Encounters:  03/05/23 134/83  12/01/22 107/60  10/12/22 101/86   Wt Readings from Last 3 Encounters:  12/01/22 259 lb (117.5 kg)  10/12/22 259 lb (117.5 kg)  06/03/22 245 lb (111.1 kg)    Physical Exam Constitutional:      Appearance: Normal appearance. She is obese.     Comments: In a wheelchair  HENT:     Head: Normocephalic.     Right Ear: Tympanic membrane, ear canal and external ear normal. There is no impacted cerumen.     Left Ear: Tympanic membrane, ear canal and external ear normal. There is no impacted cerumen.     Nose: Nose normal.     Mouth/Throat:     Mouth: Mucous membranes are moist.     Pharynx: No oropharyngeal exudate or posterior oropharyngeal erythema.  Eyes:     Conjunctiva/sclera: Conjunctivae normal.  Neck:     Comments: Left jaw line mobile pea sized node that is slightly tender Cardiovascular:     Rate and Rhythm: Normal rate and regular rhythm.     Pulses: Normal pulses.  Pulmonary:     Effort: Pulmonary effort is normal.  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   Neurological:     General: No focal deficit present.     Mental Status: She is alert and oriented to person, place, and time.  Psychiatric:        Mood and Affect: Mood normal.      The 10-year ASCVD risk score (Arnett DK, et al., 2019) is: 1%    Assessment & Plan:  Sarah Mayer was seen today for  medical management of chronic issues.  Diagnoses and all orders for this visit:  Chronic pain syndrome -     HYDROcodone -acetaminophen  (NORCO) 10-325 MG tablet; Take 1 tablet by mouth every 12 (twelve) hours. -     HYDROcodone -acetaminophen  (NORCO) 10-325 MG tablet; Take 1 tablet by mouth every 12 (twelve) hours. -     HYDROcodone -acetaminophen  (NORCO) 10-325 MG tablet; Take 1 tablet by mouth every 12 (twelve) hours. -     gabapentin  (NEURONTIN ) 800 MG tablet; Take 1 tablet (800 mg total) by mouth 3 (three) times daily.  Psoriatic arthritis (HCC) -     HYDROcodone -acetaminophen  (NORCO) 10-325 MG tablet; Take 1 tablet by mouth every 12 (twelve) hours. -     HYDROcodone -acetaminophen  (NORCO) 10-325 MG tablet; Take 1 tablet by mouth every 12 (twelve) hours. -     HYDROcodone -acetaminophen  (  NORCO) 10-325 MG tablet; Take 1 tablet by mouth every 12 (twelve) hours. -     gabapentin  (NEURONTIN ) 800 MG tablet; Take 1 tablet (800 mg total) by mouth 3 (three) times daily.  Lymph node enlargement   Pain contract UTD UDS UTD .SABRAPDMP reviewed during this encounter. No concerns Norco refilled Follow up in 3 months Refilled gabapentin /cymbalta /flexeril  for pain Keep follow up with rheumatologist to follow up on biologic  Lymph node is reassuring Continue to monitor if increasing in size or other sick symptoms follow up  Return in about 3 months (around 06/03/2023).    Dyani Babel, PA-C

## 2023-03-11 ENCOUNTER — Encounter: Payer: Self-pay | Admitting: Physician Assistant

## 2023-03-11 DIAGNOSIS — L4050 Arthropathic psoriasis, unspecified: Secondary | ICD-10-CM

## 2023-03-11 DIAGNOSIS — G894 Chronic pain syndrome: Secondary | ICD-10-CM

## 2023-03-11 NOTE — Telephone Encounter (Signed)
Prior auth for: Hydrocodone-Ace 10/325 mg Determination: DENIED Auth #: [id number] Reason: Has patient worked with a pain specialist/surgeon? Have a diagnosis of pain? Have tried both non-drug (behavioral/physical therapy) and drugs that do not contain opoids, non-steroidal, anti-inflammatory or antidepressants? Have been evaluated for risk factors of opioid harm, opioid abuse, other risk factors? Has provider considered prescribing naloxone? Is there documentation of urine drug screens every 6 months? Is there a pain management contract on file? Attest to checking PDMP? Patient notified via MyChart

## 2023-03-12 ENCOUNTER — Other Ambulatory Visit: Payer: Self-pay | Admitting: Physician Assistant

## 2023-03-12 DIAGNOSIS — J302 Other seasonal allergic rhinitis: Secondary | ICD-10-CM

## 2023-03-12 MED ORDER — HYDROCODONE-ACETAMINOPHEN 10-325 MG PO TABS: 1.0000 | ORAL_TABLET | Freq: Two times a day (BID) | ORAL | 0 refills | Status: AC

## 2023-03-15 ENCOUNTER — Other Ambulatory Visit: Payer: Self-pay | Admitting: Physician Assistant

## 2023-03-16 ENCOUNTER — Other Ambulatory Visit: Payer: Self-pay | Admitting: Physician Assistant

## 2023-03-16 MED ORDER — BUTALBITAL-APAP-CAFFEINE 50-325-40 MG PO TABS: ORAL_TABLET | ORAL | 0 refills | Status: DC

## 2023-03-19 ENCOUNTER — Other Ambulatory Visit: Payer: Self-pay | Admitting: Physician Assistant

## 2023-03-19 DIAGNOSIS — K2100 Gastro-esophageal reflux disease with esophagitis, without bleeding: Secondary | ICD-10-CM

## 2023-03-19 DIAGNOSIS — R053 Chronic cough: Secondary | ICD-10-CM

## 2023-03-24 ENCOUNTER — Telehealth (INDEPENDENT_AMBULATORY_CARE_PROVIDER_SITE_OTHER): Payer: No Typology Code available for payment source | Admitting: Physician Assistant

## 2023-03-24 DIAGNOSIS — J3489 Other specified disorders of nose and nasal sinuses: Secondary | ICD-10-CM

## 2023-03-24 DIAGNOSIS — R220 Localized swelling, mass and lump, head: Secondary | ICD-10-CM | POA: Diagnosis not present

## 2023-03-24 MED ORDER — DOXYCYCLINE HYCLATE 100 MG PO TABS: 100.0000 mg | ORAL_TABLET | Freq: Two times a day (BID) | ORAL | 0 refills | Status: DC

## 2023-03-24 MED ORDER — METHYLPREDNISOLONE 4 MG PO TBPK: ORAL_TABLET | ORAL | 0 refills | Status: DC

## 2023-03-24 NOTE — Telephone Encounter (Signed)
Prior auth for: Hydrocodone-Ace 10/325 mg Determination: DENIED Auth #: [id number] Reason: Has patient worked with a pain specialist/surgeon? Have a diagnosis of pain? Have tried both non-drug (behavioral/physical therapy) and drugs that do not contain opoids, non-steroidal, anti-inflammatory or antidepressants? Have been evaluated for risk factors of opioid harm, opioid abuse, other risk factors? Has provider considered prescribing naloxone? Is there documentation of urine drug screens every 6 months? Is there a pain management contract on file? Attest to checking PDMP? Patient notified via MyChart

## 2023-03-24 NOTE — Progress Notes (Addendum)
..Virtual Visit via Video Note  I connected with [name] on 03/24/23 at  2:20 PM EST by a video enabled telemedicine application and verified that I am speaking with the correct person using two identifiers.  Location: Patient: home Provider: clinic  .Marland KitchenParticipating in visit:  Patient: Sarah Mayer Provider: [name] PA-C   I discussed the limitations of evaluation and management by telemedicine and the availability of in person appointments. The patient expressed understanding and agreed to proceed.  History of Present Illness: Pt is a 53 yo female who calls into the clinic with left sided facial swelling( from left cheek into left lower jaw) that she woke up with this morning. Denies any tongue or lip swelling. No trouble breathing. Denies any new medications, foods or supplements. No fever, chills, body aches. She is tender to touch over left maxillary sinus. No isolated cheek warmth or tenderness. She has iced left face but no other treatments.    .. Active Ambulatory Problems    Diagnosis Date Noted   Primary and psoriatic osteoarthritis of both knees 03/24/2016   Gastroesophageal reflux disease 03/24/2016   GAD (generalized anxiety disorder) 03/24/2016   Iron deficiency anemia 03/24/2016   History of pulmonary embolus (PE) 03/27/2016   History of DVT (deep vein thrombosis) 03/27/2016   Chronic tension-type headache, not intractable 03/27/2016   Primary insomnia 03/27/2016   Bilateral lower extremity edema 03/27/2016   Chronic pain syndrome 05/25/2016   Morbidly obese (HCC) 05/27/2016   Serum calcium elevated 08/30/2016   Diarrhea 08/30/2016   Collagenous colitis 10/18/2016   Barrett's esophagus without dysplasia 10/18/2016   Arthralgia 10/18/2016   Memory changes 11/23/2016   Major depressive disorder with current active episode 11/23/2016   OSA (obstructive sleep apnea) 11/23/2016   Twitching 12/22/2016   Upper airway cough syndrome 12/22/2016   Bilateral hip  pain 12/22/2016   Tripping over things 01/12/2017   Left arm pain 01/12/2017   Left elbow pain 01/12/2017   Numbness and tingling in left arm 01/17/2017   Word finding difficulty 03/15/2017   Intractable migraine without aura and with status migrainosus 03/15/2017   Edema of both feet 07/04/2017   B12 deficiency 07/04/2017   Vitamin D deficiency 07/04/2017   Secondary hyperparathyroidism, non-renal (HCC) 07/04/2017   Psoriasis 07/06/2017   CFS (chronic fatigue syndrome) 07/06/2017   Easy bruising 07/26/2017   Myofascial pain 08/19/2017   Itching 08/19/2017   Yeast infection 08/19/2017   IBS (irritable bowel syndrome) 12/03/2017   Recurrent falls 12/29/2017   Weakness 02/11/2018   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   Urinary frequency 02/14/2018   Chronic left shoulder pain 03/15/2018   Chronic pain of left thumb 03/15/2018   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   Jerking 09/12/2018   Episode of confusion 09/12/2018   Bilateral leg pain 11/14/2018   Dry mouth 11/14/2018   Bilateral leg weakness 01/23/2019   Skin infection 01/23/2019   Chronic nonintractable headache 02/07/2019   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   Intertrigo 08/11/2019   Melasma 08/11/2019   OAB (overactive bladder) 08/11/2019   DDD (degenerative disc disease), lumbar 01/23/2020   Osteoarthritis, hip, bilateral 01/23/2020   Cough, persistent 06/24/2020   COVID-19 long hauler 02/18/2021   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   Hallucinations 07/01/2021   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   Nausea 07/01/2021   Gastroesophageal reflux disease with esophagitis without hemorrhage 07/01/2021   Constipation 10/21/2021   Sternum pain 10/21/2021   Psoriatic arthritis (HCC) 06/03/2022   Open wound of umbilical region 07/03/2022   Class 3  severe obesity due to excess calories with serious comorbidity and body mass index (BMI) of 50.0 to 59.9 in adult [facility]) 10/12/2022   Post-viral cough syndrome 01/13/2023   Lymph node enlargement  03/10/2023   Resolved Ambulatory Problems    Diagnosis Date Noted   Hyperparathyroidism, primary (HCC) 09/18/2016   Rash and nonspecific skin eruption 10/18/2016   Acquired hypothyroidism 07/04/2017   Acute bronchitis 02/07/2018   Elevated serum creatinine 11/14/2018   Pneumonia of left upper lobe due to Chlamydia species 08/11/2019   Acute cystitis without hematuria 07/10/2020   Past Medical History:  Diagnosis Date   History of pulmonary embolism 03/27/2016   Microscopic colitis    Migraine    Morbid obesity (HCC) 05/27/2016    Observations/Objective: No acute distress Normal breathing No lip or tongue swelling Symmetric facial movements Left facial swelling from under the left cheek into the lower left jaw.  Some tenderness to palpation touching area.   Assessment and Plan: Marland KitchenMarland KitchenAnjelita was seen today for lymph node and facial swelling.  Diagnoses and all orders for this visit:  Left facial swelling -     doxycycline (VIBRA-TABS) 100 MG tablet; Take 1 tablet (100 mg total) by mouth 2 (two) times daily. -     methylPREDNISolone (MEDROL DOSEPAK) 4 MG TBPK tablet; Take as directed by package insert.  Tenderness over maxillary sinus -     doxycycline (VIBRA-TABS) 100 MG tablet; Take 1 tablet (100 mg total) by mouth 2 (two) times daily. -     methylPREDNISolone (MEDROL DOSEPAK) 4 MG TBPK tablet; Take as directed by package insert.   Pt has no signs of salivary stone or allergic reaction Swelling not over parotid gland to be mumps Cranial nerves intact with no signs of bells palsy ? Sinusitis Start doxycycline and medrol dose pack Follow up if worsening or not improving   Follow Up Instructions:    I discussed the assessment and treatment plan with the patient. The patient was provided an opportunity to ask questions and all were answered. The patient agreed with the plan and demonstrated an understanding of the instructions.   The patient was advised to call back or seek  an in-person evaluation if the symptoms worsen or if the condition fails to improve as anticipated.    [name], PA-C

## 2023-03-25 ENCOUNTER — Other Ambulatory Visit: Payer: Self-pay | Admitting: Physician Assistant

## 2023-03-25 ENCOUNTER — Encounter: Payer: Self-pay | Admitting: Physician Assistant

## 2023-03-25 DIAGNOSIS — G43011 Migraine without aura, intractable, with status migrainosus: Secondary | ICD-10-CM

## 2023-03-26 ENCOUNTER — Encounter: Payer: Self-pay | Admitting: Physician Assistant

## 2023-03-28 ENCOUNTER — Other Ambulatory Visit: Payer: Self-pay | Admitting: Physician Assistant

## 2023-03-28 ENCOUNTER — Encounter: Payer: Self-pay | Admitting: Physician Assistant

## 2023-03-28 DIAGNOSIS — L304 Erythema intertrigo: Secondary | ICD-10-CM

## 2023-03-28 DIAGNOSIS — B3731 Acute candidiasis of vulva and vagina: Secondary | ICD-10-CM

## 2023-03-30 MED ORDER — NYSTATIN 100000 UNIT/GM EX POWD: 1.0000 | Freq: Three times a day (TID) | CUTANEOUS | 1 refills | Status: DC

## 2023-03-30 MED ORDER — FLUCONAZOLE 150 MG PO TABS: ORAL_TABLET | ORAL | 0 refills | Status: DC

## 2023-03-30 MED ORDER — NYSTATIN 100000 UNIT/GM EX OINT: 1.0000 | TOPICAL_OINTMENT | Freq: Two times a day (BID) | CUTANEOUS | 0 refills | Status: DC

## 2023-04-07 ENCOUNTER — Other Ambulatory Visit: Payer: Self-pay | Admitting: Physician Assistant

## 2023-04-07 DIAGNOSIS — F419 Anxiety disorder, unspecified: Secondary | ICD-10-CM

## 2023-04-07 DIAGNOSIS — G8929 Other chronic pain: Secondary | ICD-10-CM

## 2023-04-07 DIAGNOSIS — M7918 Myalgia, other site: Secondary | ICD-10-CM

## 2023-04-08 ENCOUNTER — Encounter: Payer: Self-pay | Admitting: Physician Assistant

## 2023-04-08 DIAGNOSIS — R29898 Other symptoms and signs involving the musculoskeletal system: Secondary | ICD-10-CM

## 2023-04-08 DIAGNOSIS — G894 Chronic pain syndrome: Secondary | ICD-10-CM

## 2023-04-08 DIAGNOSIS — R2689 Other abnormalities of gait and mobility: Secondary | ICD-10-CM

## 2023-04-08 DIAGNOSIS — I2699 Other pulmonary embolism without acute cor pulmonale: Secondary | ICD-10-CM

## 2023-04-08 DIAGNOSIS — M160 Bilateral primary osteoarthritis of hip: Secondary | ICD-10-CM

## 2023-04-08 DIAGNOSIS — L4050 Arthropathic psoriasis, unspecified: Secondary | ICD-10-CM

## 2023-04-11 ENCOUNTER — Other Ambulatory Visit: Payer: Self-pay | Admitting: Physician Assistant

## 2023-04-11 DIAGNOSIS — F419 Anxiety disorder, unspecified: Secondary | ICD-10-CM

## 2023-04-11 DIAGNOSIS — L304 Erythema intertrigo: Secondary | ICD-10-CM

## 2023-04-11 DIAGNOSIS — G43011 Migraine without aura, intractable, with status migrainosus: Secondary | ICD-10-CM

## 2023-04-12 ENCOUNTER — Other Ambulatory Visit: Payer: Self-pay | Admitting: Physician Assistant

## 2023-04-13 ENCOUNTER — Other Ambulatory Visit: Payer: Self-pay | Admitting: Physician Assistant

## 2023-04-13 DIAGNOSIS — G8929 Other chronic pain: Secondary | ICD-10-CM

## 2023-04-14 DIAGNOSIS — R2689 Other abnormalities of gait and mobility: Secondary | ICD-10-CM | POA: Insufficient documentation

## 2023-04-14 DIAGNOSIS — I2699 Other pulmonary embolism without acute cor pulmonale: Secondary | ICD-10-CM | POA: Insufficient documentation

## 2023-04-16 ENCOUNTER — Other Ambulatory Visit: Payer: Self-pay | Admitting: Physician Assistant

## 2023-04-16 DIAGNOSIS — R053 Chronic cough: Secondary | ICD-10-CM

## 2023-04-27 MED ORDER — HYDROCODONE-ACETAMINOPHEN 10-325 MG PO TABS: 1.0000 | ORAL_TABLET | Freq: Two times a day (BID) | ORAL | 0 refills | Status: DC

## 2023-04-27 NOTE — Addendum Note (Signed)
 Addended by: Jomarie Longs on: 04/27/2023 04:27 PM   Modules accepted: Orders

## 2023-05-03 ENCOUNTER — Ambulatory Visit (INDEPENDENT_AMBULATORY_CARE_PROVIDER_SITE_OTHER): Payer: No Typology Code available for payment source | Admitting: Physician Assistant

## 2023-05-03 VITALS — BP 138/78 | HR 72

## 2023-05-03 DIAGNOSIS — F329 Major depressive disorder, single episode, unspecified: Secondary | ICD-10-CM

## 2023-05-03 DIAGNOSIS — L4050 Arthropathic psoriasis, unspecified: Secondary | ICD-10-CM

## 2023-05-03 DIAGNOSIS — R29898 Other symptoms and signs involving the musculoskeletal system: Secondary | ICD-10-CM | POA: Diagnosis not present

## 2023-05-03 DIAGNOSIS — I2699 Other pulmonary embolism without acute cor pulmonale: Secondary | ICD-10-CM

## 2023-05-03 DIAGNOSIS — R21 Rash and other nonspecific skin eruption: Secondary | ICD-10-CM | POA: Diagnosis not present

## 2023-05-03 DIAGNOSIS — G894 Chronic pain syndrome: Secondary | ICD-10-CM

## 2023-05-03 DIAGNOSIS — F411 Generalized anxiety disorder: Secondary | ICD-10-CM

## 2023-05-03 DIAGNOSIS — G43011 Migraine without aura, intractable, with status migrainosus: Secondary | ICD-10-CM

## 2023-05-03 DIAGNOSIS — F339 Major depressive disorder, recurrent, unspecified: Secondary | ICD-10-CM

## 2023-05-03 DIAGNOSIS — M797 Fibromyalgia: Secondary | ICD-10-CM | POA: Insufficient documentation

## 2023-05-03 DIAGNOSIS — F410 Panic disorder [episodic paroxysmal anxiety] without agoraphobia: Secondary | ICD-10-CM

## 2023-05-03 DIAGNOSIS — R2689 Other abnormalities of gait and mobility: Secondary | ICD-10-CM

## 2023-05-03 DIAGNOSIS — N3281 Overactive bladder: Secondary | ICD-10-CM

## 2023-05-03 NOTE — Progress Notes (Incomplete)
 Established Patient Office Visit  Subjective   Patient ID: Sarah Mayer, female    DOB: January 25, 1971  Age: 53 y.o. MRN: [id number]  CC: 3 month follow up   HPI Patient is a 53 yo female who presents for refills on hydrocodone. Her insurance recently changed. She is on pain contract.   She was followed by hematology for iron deficiency anemia and chronic blood clots. She is currently on Eliquis.  [facility].   She noticed a spot on her left breast that is red. She first noticed it on February 22, 2023. Denies itching or pain. She states it looks like a 'road map.'   She also states that she sometimes feels that something has slammed into her or that she has run into something randomly. She states after it happens it feels like she will pass out. This has happened the last two days 3-4/night. She is struggling on how to explain it. She is sitting each time this has happened and feels like this sensation came from the front. She did recently start back on Skyryzi. She has bilateral leg weakness and chronic pain that make it difficult to ambulate. She has seen neurology in the past and wants to be reevaluated.   .. Active Ambulatory Problems    Diagnosis Date Noted   Primary and psoriatic osteoarthritis of both knees 03/24/2016   Gastroesophageal reflux disease 03/24/2016   GAD (generalized anxiety disorder) 03/24/2016   Iron deficiency anemia 03/24/2016   History of pulmonary embolus (PE) 03/27/2016   History of DVT (deep vein thrombosis) 03/27/2016   Chronic tension-type headache, not intractable 03/27/2016   Primary insomnia 03/27/2016   Bilateral lower extremity edema 03/27/2016   Chronic pain syndrome 05/25/2016   Morbidly obese (HCC) 05/27/2016   Serum calcium elevated 08/30/2016   Diarrhea 08/30/2016   Collagenous colitis 10/18/2016   Barrett's esophagus without dysplasia 10/18/2016   Arthralgia 10/18/2016   Memory changes 11/23/2016   Major  depressive disorder with current active episode 11/23/2016   OSA (obstructive sleep apnea) 11/23/2016   Twitching 12/22/2016   Upper airway cough syndrome 12/22/2016   Bilateral hip pain 12/22/2016   Tripping over things 01/12/2017   Left arm pain 01/12/2017   Left elbow pain 01/12/2017   Numbness and tingling in left arm 01/17/2017   Word finding difficulty 03/15/2017   Intractable migraine without aura and with status migrainosus 03/15/2017   Edema of both feet 07/04/2017   B12 deficiency 07/04/2017   Vitamin D deficiency 07/04/2017   Secondary hyperparathyroidism, non-renal (HCC) 07/04/2017   Psoriasis 07/06/2017   CFS (chronic fatigue syndrome) 07/06/2017   Easy bruising 07/26/2017   Myofascial pain 08/19/2017   Itching 08/19/2017   Yeast infection 08/19/2017   IBS (irritable bowel syndrome) 12/03/2017   Recurrent falls 12/29/2017   Weakness 02/11/2018   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   Urinary frequency 02/14/2018   Chronic left shoulder pain 03/15/2018   Chronic pain of left thumb 03/15/2018   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   Jerking 09/12/2018   Episode of confusion 09/12/2018   Bilateral leg pain 11/14/2018   Dry mouth 11/14/2018   Bilateral leg weakness 01/23/2019   Skin infection 01/23/2019   Chronic nonintractable headache 02/07/2019   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   Intertrigo 08/11/2019   Melasma 08/11/2019   OAB (overactive bladder) 08/11/2019   DDD (degenerative disc disease), lumbar 01/23/2020   Osteoarthritis, hip, bilateral 01/23/2020   Cough, persistent 06/24/2020   COVID-19 long hauler 02/18/2021  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   Hallucinations 07/01/2021   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   Nausea 07/01/2021   Gastroesophageal reflux disease with esophagitis without hemorrhage 07/01/2021   Constipation 10/21/2021   Sternum pain 10/21/2021   Psoriatic arthritis (HCC) 06/03/2022   Open wound of umbilical region 07/03/2022   Class 3 severe obesity  due to excess calories with serious comorbidity and body mass index (BMI) of 50.0 to 59.9 in adult [facility]) 10/12/2022   Post-viral cough syndrome 01/13/2023   Lymph node enlargement 03/10/2023   Balance problems 04/14/2023   Recurrent pulmonary embolism (HCC) 04/14/2023   Fibromyalgia 05/03/2023   Rash 05/04/2023   Resolved Ambulatory Problems    Diagnosis Date Noted   Hyperparathyroidism, primary (HCC) 09/18/2016   Rash and nonspecific skin eruption 10/18/2016   Acquired hypothyroidism 07/04/2017   Acute bronchitis 02/07/2018   Elevated serum creatinine 11/14/2018   Pneumonia of left upper lobe due to Chlamydia species 08/11/2019   Acute cystitis without hematuria 07/10/2020   Past Medical History:  Diagnosis Date   History of pulmonary embolism 03/27/2016   Microscopic colitis    Migraine    Morbid obesity (HCC) 05/27/2016    Review of Systems  All other systems reviewed and are negative.  Objective:    BP 138/78 (BP Location: Right Arm, Patient Position: Sitting, Cuff Size: Large)   Pulse 72  BP Readings from Last 3 Encounters:  05/04/23 138/78  03/05/23 134/83  12/01/22 107/60   Wt Readings from Last 3 Encounters:  12/01/22 259 lb (117.5 kg)  10/12/22 259 lb (117.5 kg)  06/03/22 245 lb (111.1 kg)   SpO2 Readings from Last 3 Encounters:  03/05/23 99%  12/01/22 99%  10/12/22 99%   Physical Exam Constitutional:      Appearance: Normal appearance.  HENT:     Head: Normocephalic and atraumatic.  Cardiovascular:     Rate and Rhythm: Normal rate and regular rhythm.     Pulses: Normal pulses.     Heart sounds: Normal heart sounds.  Pulmonary:     Effort: Pulmonary effort is normal.     Breath sounds: Normal breath sounds.  Chest:  Breasts:    Right: Normal.     Left: Skin change present. No swelling or tenderness.     Comments: Reticular rash on left breast that is non-blanchable and non-tender to palpation Musculoskeletal:        General: Deformity present.      Comments: Deformity of the PIP and MCP bilaterally  Skin:    General: Skin is warm.  Neurological:     Mental Status: She is alert.  Psychiatric:        Mood and Affect: Mood normal.      Assessment & Plan:  Marland KitchenMarland KitchenDiagnoses and all orders for this visit:  Rash -     MM 3D DIAGNOSTIC MAMMOGRAM UNILATERAL LEFT BREAST; Future  Psoriatic arthritis (HCC) -     DULoxetine (CYMBALTA) 20 MG capsule; Take 1 capsule (20 mg total) by mouth daily.  Chronic pain syndrome  Bilateral leg weakness -     Ambulatory referral to Neurology  Fibromyalgia  Recurrent pulmonary embolism (HCC) -     Ambulatory referral to Hematology / Oncology  Balance problems -     Ambulatory referral to Neurology  Panic attacks -     busPIRone (BUSPAR) 15 MG tablet; Take 1 tablet (15 mg total) by mouth 2 (two) times daily. -     vortioxetine HBr (TRINTELLIX) 20 MG TABS tablet; Take  1 tablet (20 mg total) by mouth daily.  OAB (overactive bladder) -     oxybutynin (DITROPAN-XL) 10 MG 24 hr tablet; TAKE 1 TABLET(10 MG) BY MOUTH AT BEDTIME  Major depressive disorder with current active episode, unspecified depression episode severity, unspecified whether recurrent -     vortioxetine HBr (TRINTELLIX) 20 MG TABS tablet; Take 1 tablet (20 mg total) by mouth daily.  Intractable migraine without aura and with status migrainosus -     divalproex (DEPAKOTE) 250 MG DR tablet; Take 1 tablet (250 mg total) by mouth 2 (two) times daily.  Depression, recurrent (HCC) -     buPROPion (WELLBUTRIN XL) 150 MG 24 hr tablet; Take 1 tablet (150 mg total) by mouth every morning.  GAD (generalized anxiety disorder)    On pain contract, UTD UDS, UTD .Marland KitchenPDMP reviewed during this encounter. Continue with refills Follow up in 3 months  Referral due to insurance change to [facility] for hematology and recurrent PE's On eliquis  Pt needs wheelchair due to chronic pain and joint disability from Psoriatic arthritis. Patients current  mobility limitations prevents then from completing within a reasonable time frame (or at all) one or more MRADL in the home. This mobility limitation cannot be sufficiently resolved by the use of an appropriately fitted cane or walker. Use of a manuel wheelchair will significantly improve the patient's ability to participate in MRADLs and the patient will use it on a regular basis in the home. The patient has sufficient upper body function and other physical and mental capabilities needed to safely self propel the [facility] wheelchair that is provided in the home.  Forms filled out today.   Unclear etiology of rash Would like to get diagnostic mammogram to rule out inflammatory breast cancer  Will refer to neurology for consult due to intermittent weakness and neuro symptoms  Continue to follow up with Rheumatology for PsO.    [name], PA-C

## 2023-05-03 NOTE — Patient Instructions (Signed)
 Will refill Norco.  Referral to hematology.  Referral to neurology.  Mammogram ordered

## 2023-05-04 DIAGNOSIS — R21 Rash and other nonspecific skin eruption: Secondary | ICD-10-CM | POA: Insufficient documentation

## 2023-05-04 MED ORDER — DIVALPROEX SODIUM 250 MG PO DR TAB
250.0000 mg | DELAYED_RELEASE_TABLET | Freq: Two times a day (BID) | ORAL | 1 refills | Status: AC
Start: 2023-05-04 — End: ?

## 2023-05-04 MED ORDER — OXYBUTYNIN CHLORIDE ER 10 MG PO TB24: ORAL_TABLET | ORAL | 3 refills | Status: DC

## 2023-05-04 MED ORDER — DULOXETINE HCL 20 MG PO CPEP: 20.0000 mg | ORAL_CAPSULE | Freq: Every day | ORAL | 1 refills | Status: DC

## 2023-05-04 MED ORDER — BUPROPION HCL ER (XL) 150 MG PO TB24: 150.0000 mg | ORAL_TABLET | Freq: Every morning | ORAL | 1 refills | Status: DC

## 2023-05-04 MED ORDER — BUSPIRONE HCL 15 MG PO TABS: 15.0000 mg | ORAL_TABLET | Freq: Two times a day (BID) | ORAL | 1 refills | Status: DC

## 2023-05-04 MED ORDER — VORTIOXETINE HBR 20 MG PO TABS: 20.0000 mg | ORAL_TABLET | Freq: Every day | ORAL | 1 refills | Status: DC

## 2023-05-05 ENCOUNTER — Encounter: Payer: Self-pay | Admitting: Physician Assistant

## 2023-05-08 ENCOUNTER — Other Ambulatory Visit: Payer: Self-pay | Admitting: Physician Assistant

## 2023-05-08 DIAGNOSIS — G894 Chronic pain syndrome: Secondary | ICD-10-CM

## 2023-05-08 DIAGNOSIS — L4050 Arthropathic psoriasis, unspecified: Secondary | ICD-10-CM

## 2023-05-12 ENCOUNTER — Encounter: Payer: Self-pay | Admitting: Physician Assistant

## 2023-05-14 ENCOUNTER — Ambulatory Visit: Payer: Self-pay

## 2023-05-14 NOTE — Telephone Encounter (Signed)
 Copied from CRM [id number]. Topic: Clinical - Red Word Triage >> May 14, 2023  3:24 PM Lovey Newcomer R wrote: Red Word that prompted transfer to Nurse Triage: Major swelling in left leg, foot, and ankle. Noticed at the beginning of the week. Its swelling more than usual with the pre-existing conditions patient already has.   Chief Complaint: Swelling Symptoms: Bilateral feet and ankle swelling, left side more swollen Disposition: [] ED /[] Urgent Care (no appt availability in office) / [x] Appointment(In office/virtual)/ []  [place] [facility]/ [] Home Care/ [] Refused Recommended Disposition /[] [place] [facility]/ []  Follow-up with PCP Additional Notes: Patient stated that she has chronic swelling in her bilateral feet and ankles. She feels like the swelling in the left side has increased in the past few days. She has experienced increased pain in the past couple weeks as well. Offered next available appt on Monday and patient declined. Appointment scheduled for Tuesday.   Answer Assessment - Initial Assessment Questions 1. LOCATION: "Which ankle is swollen?" "Where is the swelling?"     3-4 days.   2. ONSET: "When did the swelling start?"     Chronic swelling but the left foot/ankle swelling has worsened in the past few days  3. SWELLING: "How bad is the swelling?" Or, "How large is it?" (e.g., mild, moderate, severe; size of localized swelling)    - NONE: No joint swelling.   - LOCALIZED: Localized; small area of puffy or swollen skin (e.g., insect bite, skin irritation).   - MILD: Joint looks or feels mildly swollen or puffy.   - MODERATE: Swollen; interferes with normal activities (e.g., work or school); decreased range of movement; may be limping.   - SEVERE: Very swollen; can't move swollen joint at all; limping a lot or unable to walk.     Looks more swollen than usual. Patient is able to walk  4. PAIN: "Is there any pain?" If Yes, ask: "How bad is it?" (Scale 1-10; or mild, moderate,  severe)   - NONE (0): no pain.   - MILD (1-3): doesn't interfere with normal activities.    - MODERATE (4-7): interferes with normal activities (e.g., work or school) or awakens from sleep, limping.    - SEVERE (8-10): excruciating pain, unable to do any normal activities, unable to walk.      Hx of pain all the time, but the left leg has been more painful for a couple weeks now. Pain level at this time is moderate.  5. CAUSE: "What do you think caused the ankle swelling?"     Patient stated she has hx of fibromyalgia and arthritis  6. OTHER SYMPTOMS: "Do you have any other symptoms?" (e.g., fever, chest pain, difficulty breathing, calf pain)     Calf pain (not new), and pain below knee joints for a couple weeks, legs are a little bit pink  Protocols used: Ankle Swelling-A-AH

## 2023-05-14 NOTE — Telephone Encounter (Signed)
 Reason for Disposition . MILD or MODERATE ankle swelling (e.g., can't move joint normally, can't do usual activities) (Exceptions: Itchy, localized swelling; swelling is chronic.)  Protocols used: Ankle Swelling-A-AH

## 2023-05-15 ENCOUNTER — Other Ambulatory Visit: Payer: Self-pay | Admitting: Physician Assistant

## 2023-05-15 DIAGNOSIS — J302 Other seasonal allergic rhinitis: Secondary | ICD-10-CM

## 2023-05-18 ENCOUNTER — Ambulatory Visit

## 2023-05-18 ENCOUNTER — Ambulatory Visit: Admitting: Physician Assistant

## 2023-05-18 ENCOUNTER — Encounter: Payer: Self-pay | Admitting: Physician Assistant

## 2023-05-18 VITALS — BP 119/67 | HR 88 | Ht 60.0 in

## 2023-05-18 DIAGNOSIS — Z7901 Long term (current) use of anticoagulants: Secondary | ICD-10-CM

## 2023-05-18 DIAGNOSIS — M7989 Other specified soft tissue disorders: Secondary | ICD-10-CM | POA: Diagnosis not present

## 2023-05-18 DIAGNOSIS — Z86718 Personal history of other venous thrombosis and embolism: Secondary | ICD-10-CM | POA: Diagnosis not present

## 2023-05-18 MED ORDER — FUROSEMIDE 20 MG PO TABS: 20.0000 mg | ORAL_TABLET | Freq: Every day | ORAL | 0 refills | Status: DC

## 2023-05-18 NOTE — Patient Instructions (Signed)
 Get stat Doppler today will call with results if negative Compression and start lasix follow up in 1 week Keep legs elevated

## 2023-05-18 NOTE — Progress Notes (Signed)
 Acute Office Visit  Subjective:     Patient ID: Sarah Mayer, female    DOB: [birth year], 53 y.o.   MRN: [id number]  Chief Complaint  Patient presents with   Ankle Pain    Left ankle pain /and swelling worsening onset for 3/4 days      HPI Patient is in today for left leg swelling worsening for about 1 week. It is painful and red. No injury. No falls. She does sit most of the day and ambulates very little. She is anticoagulated with eliquis due to recurrent DVT and PE's. She did miss 2 doses about 1 week ago. She denies any changes in breathing. She has not tried anything to make swelling better.   .. Active Ambulatory Problems    Diagnosis Date Noted   Primary and psoriatic osteoarthritis of both knees 03/24/2016   Gastroesophageal reflux disease 03/24/2016   GAD (generalized anxiety disorder) 03/24/2016   Iron deficiency anemia 03/24/2016   History of pulmonary embolus (PE) 03/27/2016   History of DVT (deep vein thrombosis) 03/27/2016   Chronic tension-type headache, not intractable 03/27/2016   Primary insomnia 03/27/2016   Bilateral lower extremity edema 03/27/2016   Chronic pain syndrome 05/25/2016   Morbidly obese (HCC) 05/27/2016   Serum calcium elevated 08/30/2016   Diarrhea 08/30/2016   Collagenous colitis 10/18/2016   Barrett's esophagus without dysplasia 10/18/2016   Arthralgia 10/18/2016   Memory changes 11/23/2016   Major depressive disorder with current active episode 11/23/2016   OSA (obstructive sleep apnea) 11/23/2016   Twitching 12/22/2016   Upper airway cough syndrome 12/22/2016   Bilateral hip pain 12/22/2016   Tripping over things 01/12/2017   Left arm pain 01/12/2017   Left elbow pain 01/12/2017   Numbness and tingling in left arm 01/17/2017   Word finding difficulty 03/15/2017   Intractable migraine without aura and with status migrainosus 03/15/2017   Edema of both feet 07/04/2017   B12 deficiency 07/04/2017   Vitamin D deficiency  07/04/2017   Secondary hyperparathyroidism, non-renal (HCC) 07/04/2017   Psoriasis 07/06/2017   CFS (chronic fatigue syndrome) 07/06/2017   Easy bruising 07/26/2017   Myofascial pain 08/19/2017   Itching 08/19/2017   Yeast infection 08/19/2017   IBS (irritable bowel syndrome) 12/03/2017   Recurrent falls 12/29/2017   Weakness 02/11/2018   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   Urinary frequency 02/14/2018   Chronic left shoulder pain 03/15/2018   Chronic pain of left thumb 03/15/2018   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   Jerking 09/12/2018   Episode of confusion 09/12/2018   Bilateral leg pain 11/14/2018   Dry mouth 11/14/2018   Bilateral leg weakness 01/23/2019   Skin infection 01/23/2019   Chronic nonintractable headache 02/07/2019   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   Intertrigo 08/11/2019   Melasma 08/11/2019   OAB (overactive bladder) 08/11/2019   DDD (degenerative disc disease), lumbar 01/23/2020   Osteoarthritis, hip, bilateral 01/23/2020   Cough, persistent 06/24/2020   COVID-19 long hauler 02/18/2021   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   Hallucinations 07/01/2021   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   Nausea 07/01/2021   Gastroesophageal reflux disease with esophagitis without hemorrhage 07/01/2021   Constipation 10/21/2021   Sternum pain 10/21/2021   Psoriatic arthritis (HCC) 06/03/2022   Open wound of umbilical region 07/03/2022   Class 3 severe obesity due to excess calories with serious comorbidity and body mass index (BMI) of 50.0 to 59.9 in adult [facility]) 10/12/2022   Post-viral cough syndrome 01/13/2023   Lymph node enlargement 03/10/2023   Balance problems  04/14/2023   Recurrent pulmonary embolism (HCC) 04/14/2023   Fibromyalgia 05/03/2023   Rash 05/04/2023   Anticoagulated 05/24/2023   Resolved Ambulatory Problems    Diagnosis Date Noted   Hyperparathyroidism, primary (HCC) 09/18/2016   Rash and nonspecific skin eruption 10/18/2016   Acquired hypothyroidism 07/04/2017    Acute bronchitis 02/07/2018   Elevated serum creatinine 11/14/2018   Pneumonia of left upper lobe due to Chlamydia species 08/11/2019   Acute cystitis without hematuria 07/10/2020   Past Medical History:  Diagnosis Date   History of pulmonary embolism 03/27/2016   Microscopic colitis    Migraine    Morbid obesity (HCC) 05/27/2016     ROS See HPI.      Objective:    BP 119/67   Pulse 88   Ht 5' (1.524 m)   SpO2 100%   BMI 50.58 kg/m  BP Readings from Last 3 Encounters:  05/18/23 119/67  05/04/23 138/78  03/05/23 134/83   Wt Readings from Last 3 Encounters:  12/01/22 259 lb (117.5 kg)  10/12/22 259 lb (117.5 kg)  06/03/22 245 lb (111.1 kg)      Physical Exam Constitutional:      Appearance: Normal appearance. She is obese.     Comments: In a wheelchair  HENT:     Head: Normocephalic.  Cardiovascular:     Rate and Rhythm: Normal rate and regular rhythm.  Pulmonary:     Effort: Pulmonary effort is normal.     Breath sounds: Normal breath sounds.  Musculoskeletal:     Right lower leg: No edema.     Left lower leg: Edema present.     Comments: 1 and 1/2 pitting edema of left foot into left lower leg with erythema but no weeping areas and no significant warmth. Diffusely swollen. Tender to touch.   Neurological:     General: No focal deficit present.     Mental Status: She is alert and oriented to person, place, and time.  Psychiatric:        Mood and Affect: Mood normal.           Assessment & Plan:  Marland KitchenMarland KitchenSheryll was seen today for ankle pain.  Diagnoses and all orders for this visit:  Left leg swelling -     US Venous Img Lower Unilateral Left (DVT) -     furosemide (LASIX) 20 MG tablet; Take 1 tablet (20 mg total) by mouth daily.  History of DVT (deep vein thrombosis)  Anticoagulated   On eliquis will r/o DVT with doppler today Lasix daily Keep legs elevated If no DVT start compression Follow up in 1 week   [name], PA-C

## 2023-05-18 NOTE — Telephone Encounter (Signed)
 Prior auth for: RABEPRAZOLE Determination: Per insurance, no PA is required Auth #: [id number] Valid from: N/A

## 2023-05-19 ENCOUNTER — Encounter: Payer: Self-pay | Admitting: Physician Assistant

## 2023-05-19 ENCOUNTER — Telehealth: Payer: Self-pay

## 2023-05-19 NOTE — Progress Notes (Signed)
 NO DVT. Use compression and elevation and start lasix.

## 2023-05-19 NOTE — Telephone Encounter (Signed)
 Copied from CRM [id number]. Topic: Referral - Status >> May 19, 2023 11:14 AM Louie Casa B wrote: Reason for CRM: [facility] to say that they are closing the patient referral any questions please call  [phone]

## 2023-05-20 ENCOUNTER — Other Ambulatory Visit: Payer: Self-pay | Admitting: Physician Assistant

## 2023-05-20 DIAGNOSIS — G43011 Migraine without aura, intractable, with status migrainosus: Secondary | ICD-10-CM

## 2023-05-21 ENCOUNTER — Encounter: Payer: Self-pay | Admitting: Physician Assistant

## 2023-05-21 ENCOUNTER — Ambulatory Visit: Payer: Self-pay | Admitting: *Deleted

## 2023-05-21 ENCOUNTER — Telehealth: Payer: Self-pay

## 2023-05-21 ENCOUNTER — Telehealth: Admitting: Physician Assistant

## 2023-05-21 DIAGNOSIS — R051 Acute cough: Secondary | ICD-10-CM | POA: Diagnosis not present

## 2023-05-21 DIAGNOSIS — J309 Allergic rhinitis, unspecified: Secondary | ICD-10-CM

## 2023-05-21 MED ORDER — DEXAMETHASONE 4 MG PO TABS: 4.0000 mg | ORAL_TABLET | Freq: Two times a day (BID) | ORAL | 0 refills | Status: DC

## 2023-05-21 MED ORDER — AZITHROMYCIN 250 MG PO TABS: ORAL_TABLET | ORAL | 0 refills | Status: DC

## 2023-05-21 MED ORDER — HYDROCODONE BIT-HOMATROP MBR 5-1.5 MG/5ML PO SOLN: 5.0000 mL | Freq: Three times a day (TID) | ORAL | 0 refills | Status: DC | PRN

## 2023-05-21 NOTE — Telephone Encounter (Signed)
 Spoke with Okey Regal at [facility]  Informed of [name]. PA message She states that they "do not feel comfortable filling the hydromet cough syrup and will not be filling this at [facility]" States patient has gotten  two fills of hydrocodone on same day in past - on March 5th filled  at [facility] as Qty #14 then on same day filled Qty #60 at [facility].  States can be sent to another pharmacy if  provider chooses

## 2023-05-21 NOTE — Telephone Encounter (Signed)
 Copied from CRM [id number]. Topic: Clinical - Prescription Issue >> May 21, 2023  3:15 PM Eunice Blase wrote: Reason for CRM: Received call form [facility] per [facility] [address], [place] - [address] [facility] [place] [id number]:[phone] Fax: [phone] pt is given HYDROcodone bit-homatropine (HYCODAN) 5-1.5 MG/5ML syrup and is taking HYDROcodone-acetaminophen (NORCO) 10-325 MG tablet, pharmacy would like a call back.

## 2023-05-21 NOTE — Telephone Encounter (Signed)
  Chief Complaint: cough Symptoms: cough, pressure in ears, nasal congestion, runny nose Frequency: worsened x 2 days Pertinent Negatives: Patient denies fever Disposition: [] ED /[] Urgent Care (no appt availability in office) / [x] Appointment(In office/virtual)/ []  [facility]/ [] Home Care/ [] Refused Recommended Disposition /[] [facility]/ []  Follow-up with PCP Additional Notes: Patient states that she has allergies and that she has been having a dry cough for a couple weeks but a few days ago it got worse and now she is experiencing pressure in her ears, runny nose, nasal congestion and SOB when coughing.   Reason for Disposition  [1] Patient also has allergy symptoms (e.g., itchy eyes, clear nasal discharge, postnasal drip) AND [2] they are acting up  Answer Assessment - Initial Assessment Questions 1. ONSET: "When did the cough begin?"      A couple weeks  2. SEVERITY: "How bad is the cough today?"      mod 3. SPUTUM: "Describe the color of your sputum" (none, dry cough; clear, white, yellow, green)     no 4. HEMOPTYSIS: "Are you coughing up any blood?" If so ask: "How much?" (flecks, streaks, tablespoons, etc.)     no 5. DIFFICULTY BREATHING: "Are you having difficulty breathing?" If Yes, ask: "How bad is it?" (e.g., mild, moderate, severe)    - MILD: No SOB at rest, mild SOB with walking, speaks normally in sentences, can lie down, no retractions, pulse < 100.    - MODERATE: SOB at rest, SOB with minimal exertion and prefers to sit, cannot lie down flat, speaks in phrases, mild retractions, audible wheezing, pulse 100-120.    - SEVERE: Very SOB at rest, speaks in single words, struggling to breathe, sitting hunched forward, retractions, pulse > 120      Mild after coughing 6. FEVER: "Do you have a fever?" If Yes, ask: "What is your temperature, how was it measured, and when did it start?"     no 7. CARDIAC HISTORY: "Do you have any history of heart disease?"  (e.g., heart attack, congestive heart failure)      no 8. LUNG HISTORY: "Do you have any history of lung disease?"  (e.g., pulmonary embolus, asthma, emphysema)     Pulmonary embolus in pat 10. OTHER SYMPTOMS: "Do you have any other symptoms?" (e.g., runny nose, wheezing, chest pain)       Sore throat, headache, runny nose, pressure in ears  Protocols used: Cough - Acute Non-Productive-A-AH

## 2023-05-21 NOTE — Telephone Encounter (Signed)
 Copied from CRM [id number]. Topic: Clinical - Medical Advice >> May 21, 2023  8:35 AM Sarah Mayer wrote: Reason for CRM: patient is experiencing a dry hacking cough, stuffy/runny nose and her face hurts. Patient is requesting medication for her symptoms

## 2023-05-21 NOTE — Telephone Encounter (Signed)
 Attempted to return her call.   Left a voicemail to call back.

## 2023-05-21 NOTE — Progress Notes (Signed)
..  Virtual Visit via Video Note  I connected with [name] on 05/21/23 at  1:20 PM EDT by a video enabled telemedicine application and verified that I am speaking with the correct person using two identifiers.  Location: Patient: home Provider: clinic  .Marland KitchenParticipating in visit:  Patient: Sarah Mayer Provider: [name] PA-C   I discussed the limitations of evaluation and management by telemedicine and the availability of in person appointments. The patient expressed understanding and agreed to proceed.  History of Present Illness: Pt is a 53 yo female who calls into the clinic with sinus pressure, congstion, headache, cough and drainage for last 2 weeks worsening. She has had lots of allergy symptoms and treating with anti-histamine. Not improving and now she is having a lot of green/brown mucus that she is coughing up and blowing out. No fever, chills, SOB, body aches. Not taken any flu or covid testing. Husband is not sick right now.     Observations/Objective: No acute distress Normal breathing Congested and coughing   Assessment and Plan: Marland KitchenMarland KitchenDiella was seen today for cough.  Diagnoses and all orders for this visit:  Allergic sinusitis -     dexamethasone (DECADRON) 4 MG tablet; Take 1 tablet (4 mg total) by mouth 2 (two) times daily with a meal. -     azithromycin (ZITHROMAX Z-PAK) 250 MG tablet; Take 2 tablets (500 mg) on  Day 1,  followed by 1 tablet (250 mg) once daily on Days 2 through 5.  Acute cough -     HYDROcodone bit-homatropine (HYCODAN) 5-1.5 MG/5ML syrup; Take 5 mLs by mouth every 8 (eight) hours as needed.   2 weeks of allergy symptoms into sinusitis Sent decardron and zpak Continue symptomatic care Sent hycodan with instructions to not take with hydrocodone  Canceled after conversation with pharmacy about multiple fills of hydrocodone.    Follow Up Instructions:    I discussed the assessment and treatment plan with the patient. The patient was  provided an opportunity to ask questions and all were answered. The patient agreed with the plan and demonstrated an understanding of the instructions.   The patient was advised to call back or seek an in-person evaluation if the symptoms worsen or if the condition fails to improve as anticipated.   [name], PA-C

## 2023-05-24 ENCOUNTER — Ambulatory Visit: Admitting: Physician Assistant

## 2023-05-24 ENCOUNTER — Telehealth: Payer: Self-pay

## 2023-05-24 ENCOUNTER — Encounter: Payer: Self-pay | Admitting: Physician Assistant

## 2023-05-24 DIAGNOSIS — Z7901 Long term (current) use of anticoagulants: Secondary | ICD-10-CM | POA: Insufficient documentation

## 2023-05-24 MED ORDER — MOLNUPIRAVIR EUA 200MG CAPSULE: 4.0000 | ORAL_CAPSULE | Freq: Two times a day (BID) | ORAL | 0 refills | Status: AC

## 2023-05-24 NOTE — Telephone Encounter (Signed)
 Copied from CRM [id number]. Topic: Clinical - Medical Advice >> May 21, 2023  4:50 PM DeAngela L wrote: Reason for CRM: Patient calling back to report both her and her husband has positive Covid test results had an appt with Pcp today and discussed anti medication treatment Patient call back number [phone]

## 2023-05-24 NOTE — Telephone Encounter (Signed)
 Called and left detailed voice mail message on patient's home # =[email or web address]

## 2023-05-24 NOTE — Addendum Note (Signed)
 Addended by: Jomarie Longs on: 05/24/2023 01:03 PM   Modules accepted: Orders

## 2023-05-25 ENCOUNTER — Encounter: Payer: Self-pay | Admitting: Physician Assistant

## 2023-05-27 NOTE — Telephone Encounter (Signed)
 FYI-Spoke with patient  She was not able to pick up the molnupivir  She is now outside of her window for treatment as symptoms began about 1 weeks ago  She is feeling better - still having some lingering cough and congestion

## 2023-05-29 ENCOUNTER — Other Ambulatory Visit: Payer: Self-pay | Admitting: Physician Assistant

## 2023-05-29 DIAGNOSIS — R053 Chronic cough: Secondary | ICD-10-CM

## 2023-05-31 ENCOUNTER — Ambulatory Visit (INDEPENDENT_AMBULATORY_CARE_PROVIDER_SITE_OTHER): Admitting: Physician Assistant

## 2023-05-31 ENCOUNTER — Telehealth: Payer: Self-pay

## 2023-05-31 ENCOUNTER — Encounter: Payer: Self-pay | Admitting: Physician Assistant

## 2023-05-31 VITALS — BP 105/63 | HR 59 | Ht 60.0 in

## 2023-05-31 DIAGNOSIS — M7989 Other specified soft tissue disorders: Secondary | ICD-10-CM

## 2023-05-31 DIAGNOSIS — R443 Hallucinations, unspecified: Secondary | ICD-10-CM | POA: Diagnosis not present

## 2023-05-31 DIAGNOSIS — L409 Psoriasis, unspecified: Secondary | ICD-10-CM | POA: Diagnosis not present

## 2023-05-31 DIAGNOSIS — M797 Fibromyalgia: Secondary | ICD-10-CM

## 2023-05-31 DIAGNOSIS — E66813 Obesity, class 3: Secondary | ICD-10-CM | POA: Diagnosis not present

## 2023-05-31 DIAGNOSIS — K5909 Other constipation: Secondary | ICD-10-CM

## 2023-05-31 DIAGNOSIS — M21372 Foot drop, left foot: Secondary | ICD-10-CM

## 2023-05-31 DIAGNOSIS — Z6841 Body Mass Index (BMI) 40.0 and over, adult: Secondary | ICD-10-CM

## 2023-05-31 DIAGNOSIS — G894 Chronic pain syndrome: Secondary | ICD-10-CM

## 2023-05-31 DIAGNOSIS — L4050 Arthropathic psoriasis, unspecified: Secondary | ICD-10-CM

## 2023-05-31 DIAGNOSIS — R29898 Other symptoms and signs involving the musculoskeletal system: Secondary | ICD-10-CM

## 2023-05-31 MED ORDER — LINACLOTIDE 290 MCG PO CAPS: 290.0000 ug | ORAL_CAPSULE | Freq: Every day | ORAL | 1 refills | Status: DC

## 2023-05-31 MED ORDER — CLOBETASOL PROPIONATE 0.05 % EX CREA: 1.0000 | TOPICAL_CREAM | Freq: Two times a day (BID) | CUTANEOUS | 1 refills | Status: AC

## 2023-05-31 MED ORDER — ZEPBOUND 2.5 MG/0.5ML ~~LOC~~ SOAJ: 2.5000 mg | SUBCUTANEOUS | 0 refills | Status: DC

## 2023-05-31 MED ORDER — FUROSEMIDE 20 MG PO TABS: 20.0000 mg | ORAL_TABLET | Freq: Every day | ORAL | 0 refills | Status: DC

## 2023-05-31 NOTE — Telephone Encounter (Signed)
 Pharmacy Patient Advocate Encounter   Received notification from Patient Pharmacy that prior authorization for Zepbound 2.5 is required/requested.   Insurance verification completed.   The patient is insured through [facility] .   Per test claim: PA required; PA submitted to above mentioned insurance via CoverMyMeds Key/confirmation #/EOC [id number] Status is pending

## 2023-05-31 NOTE — Progress Notes (Unsigned)
 Established Patient Office Visit  Subjective   Patient ID: Sarah Mayer, female    DOB: [birth year]  Age: 53 y.o. MRN: [id number]  Chief Complaint  Patient presents with   Medical Management of Chronic Issues    Left leg edema, fup on cough and coongestion    HPI Pt is a 53 yo obese female who presents to the clinic to follow up on left leg swelling and drop foot of left foot.   Her swelling has improved with elevation and lasix. She has noticed the foot drop a few days ago and concerning to her. No numbness or tingling. It has caused her to be tripped up a little more easier.   Psoriatic arthritis with some rash areas and needing clobetasol.   Pt has been having some visual hallcinations and seeing things that are not there. She has had this before but resolved. Seems to have come back. She will see people who are dead and snakes and spiders in the house. She admits to being out of trintellix for 2 weeks.   She is having lots of gas and bloating. Admits she does not have regular bowel movements and at times she strains and they are hard. Miralax not helping.     ROS    Objective:     BP 105/63   Pulse (!) 59   Ht 5' (1.524 m)   SpO2 99%   BMI 50.58 kg/m  BP Readings from Last 3 Encounters:  05/31/23 105/63  05/18/23 119/67  05/04/23 138/78   Wt Readings from Last 3 Encounters:  12/01/22 259 lb (117.5 kg)  10/12/22 259 lb (117.5 kg)  06/03/22 245 lb (111.1 kg)      Physical Exam Constitutional:      Appearance: Normal appearance. She is obese.  HENT:     Head: Normocephalic.  Cardiovascular:     Rate and Rhythm: Normal rate.  Pulmonary:     Effort: Pulmonary effort is normal.  Musculoskeletal:     Right lower leg: No edema.     Left lower leg: Edema present.  Neurological:     General: No focal deficit present.     Mental Status: She is alert and oriented to person, place, and time.  Psychiatric:        Mood and Affect: Mood normal.     The  10-year ASCVD risk score (Arnett DK, et al., 2019) is: 0.7%    Assessment & Plan:  Sarah KitchenMarland KitchenAverly was seen today for medical management of chronic issues.  Diagnoses and all orders for this visit:  Hallucinations  Psoriatic arthritis (HCC)  Psoriasis -     clobetasol cream (TEMOVATE) 0.05 %; Apply 1 Application topically 2 (two) times daily.  Class 3 severe obesity due to excess calories with serious comorbidity and body mass index (BMI) of 50.0 to 59.9 in adult (HCC) -     tirzepatide (ZEPBOUND) 2.5 MG/0.5ML Pen; Inject 2.5 mg into the skin once a week.  Foot drop, left foot -     Ambulatory referral to Neurology  Fibromyalgia  Bilateral leg weakness -     Ambulatory referral to Neurology  Chronic pain syndrome  Left leg swelling -     furosemide (LASIX) 20 MG tablet; Take 1 tablet (20 mg total) by mouth daily.  Chronic constipation -     linaclotide (LINZESS) 290 MCG CAPS capsule; Take 1 capsule (290 mcg total) by mouth daily.   Lasix as needed Referral to neurology for drop foot  Ok to wear boot she has at home as brace until then HO given Linzess for constipation, failed miralax and stool softeners Restart trintellix for mood could be reason for hallucinations Restart zepbound for weight loss Was on but could not get at pharmacy Tried years of diets and failed Failed topamax, phentermine, wellbutrin Follow up in 4 weeks    Sarah Gaw, PA-C

## 2023-05-31 NOTE — Patient Instructions (Addendum)
 Restart trintellix Will make referral for neurology Ok to wear boot for drop foot Start linzess for constipation and GAS X for gas Clobetasol for rash and itchy skin lesions  Zepbound for weight loss  Common Peroneal Nerve Entrapment  Common peroneal nerve entrapment is a condition that can make it hard to lift a foot. The condition results from pressure on a nerve in the lower leg called the common peroneal nerve. Your common peroneal nerve provides feeling to your outer lower leg and foot. It also supplies the muscles that move your foot and toes upward and outward. What are the causes? This condition may be caused by: Sitting cross-legged, squatting, or kneeling for long periods of time. A hard, direct hit to the outside of the lower leg. Swelling from a knee injury. A break or fracture in one of the lower leg bones. Wearing a boot or cast that ends just below the knee. A growth or cyst near the nerve. What increases the risk? This condition is more likely to develop in people who play: Contact sports, such as football or hockey. Sports where you wear high and stiff boots, such as skiing. What are the signs or symptoms? Symptoms of this condition include: Trouble lifting your foot up (foot drop). Tripping often. Your foot hitting the ground harder than normal as you walk, resulting in a slapping-type sound. Numbness, tingling, or pain in the outside of the knee, outside of the lower leg, and top of the foot. Sensitivity to pressure on the front or outside of the leg. How is this diagnosed? This condition may be diagnosed based on: Your symptoms. Your medical history. A physical exam. Tests, such as: X-rays to check the bones of your knee and leg. MRI to check tendons that attach to the side of your knee. Ultrasound to check for a growth or cyst. Electromyogram (EMG) to check your nerves. During your physical exam, your health care provider will check for numbness in your  leg and test the strength of your lower leg muscles. He or she may tap the side of your lower leg to see if that causes tingling. How is this treated? Treatment for this condition may include: Avoiding activities that make symptoms worse. Using a brace to hold up your foot and toes. Taking anti-inflammatory pain medicines to relieve swelling and lessen pain. Having medicines injected into your ankle joint to lessen pain and swelling. Doing exercises to help you regain or maintain movement (physical therapy). Surgery to take pressure off the nerve. This may be needed if there is no improvement after 2-3 months or if there is a growth pushing on the nerve. Returning gradually to full activity. Follow these instructions at home: If you have a removable brace: Wear the brace as told by your health care provider. Remove it only as told by your health care provider. Check the skin around the brace every day. Tell your health care provider about any concerns. Loosen the brace if your toes tingle, become numb, or turn cold and blue. Keep the brace clean. If the brace is not waterproof: Do not let it get wet. Cover it with a watertight covering when you take a bath or a shower. Activity Ask your health care provider when it is safe to drive if you have a brace on your foot. Do not do any activities that make pain or swelling worse. Do exercises as told by your health care provider. Return to your normal activities as told by your health  care provider. Ask your health care provider what activities are safe for you. General instructions Take over-the-counter and prescription medicines only as told by your health care provider. Do not put your full weight on your knee until your health care provider says you can. Use crutches as directed by your health care provider. Keep all follow-up visits. This is important. How is this prevented? Wear supportive footwear that is appropriate for your athletic  activity. Avoid athletic activities that cause ankle pain or swelling. Wear protective padding over your lower legs when playing contact sports. Make sure your boots do not put extra pressure on the area just below your knees. Do not sit cross-legged for long periods of time. Contact a health care provider if: Your symptoms do not get better in 2-3 months. The weakness or numbness in your leg or foot gets worse. Summary Common peroneal nerve entrapment is a condition that results from pressure on a nerve in the lower leg called the common peroneal nerve. This condition may be caused by a hard hit, swelling, a fracture, or a cyst in the lower leg. Treatment may include rest, a brace, medicines, and physical therapy. Sometimes surgery is needed. Do not do any activities that make pain or swelling worse. This information is not intended to replace advice given to you by your health care provider. Make sure you discuss any questions you have with your health care provider. Document Revised: 10/23/2020 Document Reviewed: 10/23/2020 Elsevier Patient Education  2024 [employer].

## 2023-06-01 ENCOUNTER — Other Ambulatory Visit (HOSPITAL_COMMUNITY): Payer: Self-pay

## 2023-06-01 NOTE — Telephone Encounter (Signed)
 Pharmacy Patient Advocate Encounter  Received notification from [facility] that Prior Authorization for Zepbound 2.5 has been DENIED.  Full denial letter will be uploaded to the media tab. See denial reason below.   PA #/Case ID/Reference #: [id number]

## 2023-06-04 DIAGNOSIS — M7989 Other specified soft tissue disorders: Secondary | ICD-10-CM | POA: Insufficient documentation

## 2023-06-10 ENCOUNTER — Other Ambulatory Visit: Payer: Self-pay | Admitting: Physician Assistant

## 2023-06-10 DIAGNOSIS — G894 Chronic pain syndrome: Secondary | ICD-10-CM

## 2023-06-10 DIAGNOSIS — L4050 Arthropathic psoriasis, unspecified: Secondary | ICD-10-CM

## 2023-06-12 ENCOUNTER — Other Ambulatory Visit: Payer: Self-pay | Admitting: Physician Assistant

## 2023-06-12 DIAGNOSIS — G43011 Migraine without aura, intractable, with status migrainosus: Secondary | ICD-10-CM

## 2023-06-12 DIAGNOSIS — J302 Other seasonal allergic rhinitis: Secondary | ICD-10-CM

## 2023-06-12 DIAGNOSIS — M7918 Myalgia, other site: Secondary | ICD-10-CM

## 2023-06-14 ENCOUNTER — Other Ambulatory Visit (HOSPITAL_COMMUNITY): Payer: Self-pay

## 2023-06-14 ENCOUNTER — Telehealth: Payer: Self-pay

## 2023-06-14 ENCOUNTER — Encounter: Payer: Self-pay | Admitting: Physician Assistant

## 2023-06-14 DIAGNOSIS — L4050 Arthropathic psoriasis, unspecified: Secondary | ICD-10-CM

## 2023-06-14 DIAGNOSIS — G894 Chronic pain syndrome: Secondary | ICD-10-CM

## 2023-06-14 MED ORDER — HYDROCODONE-ACETAMINOPHEN 10-325 MG PO TABS: 1.0000 | ORAL_TABLET | Freq: Two times a day (BID) | ORAL | 0 refills | Status: DC

## 2023-06-14 NOTE — Telephone Encounter (Addendum)
 Pharmacy Patient Advocate Encounter   Received notification from Patient Pharmacy that prior authorization for Montelukast  10 is required/requested.   Insurance verification completed.   The patient is insured through [facility] .   Per test claim: PA required; PA submitted to above mentioned insurance via CoverMyMeds Key/confirmation #/EOC [id number] Status is pending

## 2023-06-14 NOTE — Telephone Encounter (Signed)
..  PDMP reviewed during this encounter. No concerns.  UTD appt.  Sent norco refill.

## 2023-06-15 ENCOUNTER — Telehealth: Payer: Self-pay

## 2023-06-15 ENCOUNTER — Other Ambulatory Visit (HOSPITAL_COMMUNITY): Payer: Self-pay

## 2023-06-15 NOTE — Telephone Encounter (Signed)
 Pharmacy Patient Advocate Encounter   Received notification from Patient Pharmacy that prior authorization for Zepbound  2.5 is required/requested.   Insurance verification completed.   The patient is insured through [employer] .   Per test claim: PA required; PA submitted to above mentioned insurance via CoverMyMeds Key/confirmation #/EOC [id number] Status is pending. Patient insurance does not cover this medication, see encounter 06/01/23

## 2023-06-15 NOTE — Telephone Encounter (Signed)
 Pharmacy Patient Advocate Encounter  Received notification from [facility] that Prior Authorization for Zepbound  2.5 has been DENIED.  Full denial letter will be uploaded to the media tab. See denial reason below.   PA #/Case ID/Reference #: [id number]   Patients insurance plan does not cover this medication.

## 2023-06-16 ENCOUNTER — Other Ambulatory Visit (HOSPITAL_COMMUNITY): Payer: Self-pay

## 2023-06-16 ENCOUNTER — Encounter: Payer: Self-pay | Admitting: Physician Assistant

## 2023-06-16 ENCOUNTER — Telehealth: Payer: Self-pay

## 2023-06-16 MED ORDER — WEGOVY 0.25 MG/0.5ML ~~LOC~~ SOAJ: 0.2500 mg | SUBCUTANEOUS | 0 refills | Status: DC

## 2023-06-16 NOTE — Telephone Encounter (Signed)
 PA request has been Approved. New Encounter has been or will be created for follow up. For additional info see Pharmacy Prior Auth telephone encounter from 06/14/23.  Patient insurance prefers a 90 day supply, test billing is for 90.

## 2023-06-17 ENCOUNTER — Telehealth: Payer: Self-pay

## 2023-06-17 ENCOUNTER — Other Ambulatory Visit (HOSPITAL_COMMUNITY): Payer: Self-pay

## 2023-06-17 NOTE — Telephone Encounter (Signed)
 Pharmacy Patient Advocate Encounter   Received notification from Patient Pharmacy that prior authorization for Wegovy  0.25 is required/requested.   Insurance verification completed.   The patient is insured through [facility] MEDICAID .   Per test claim: PA required; PA submitted to above mentioned insurance via CoverMyMeds Key/confirmation #/EOC [id number] Status is pending

## 2023-06-19 ENCOUNTER — Other Ambulatory Visit: Payer: Self-pay | Admitting: Physician Assistant

## 2023-06-19 DIAGNOSIS — J302 Other seasonal allergic rhinitis: Secondary | ICD-10-CM

## 2023-06-19 DIAGNOSIS — M7989 Other specified soft tissue disorders: Secondary | ICD-10-CM

## 2023-06-21 ENCOUNTER — Encounter: Payer: Self-pay | Admitting: Physician Assistant

## 2023-06-21 ENCOUNTER — Other Ambulatory Visit (HOSPITAL_COMMUNITY): Payer: Self-pay

## 2023-06-21 NOTE — Telephone Encounter (Signed)
 Pharmacy Patient Advocate Encounter  Received notification from [facility] that Prior Authorization for Wegovy  0.25 has been DENIED.  Full denial letter will be uploaded to the media tab. See denial reason below.   PA #/Case ID/Reference #: [id number]

## 2023-06-25 ENCOUNTER — Encounter: Payer: Self-pay | Admitting: Physician Assistant

## 2023-06-26 ENCOUNTER — Other Ambulatory Visit: Payer: Self-pay | Admitting: Physician Assistant

## 2023-06-26 DIAGNOSIS — F419 Anxiety disorder, unspecified: Secondary | ICD-10-CM

## 2023-06-26 DIAGNOSIS — J302 Other seasonal allergic rhinitis: Secondary | ICD-10-CM

## 2023-07-02 ENCOUNTER — Other Ambulatory Visit: Payer: Self-pay | Admitting: Physician Assistant

## 2023-07-02 DIAGNOSIS — G43011 Migraine without aura, intractable, with status migrainosus: Secondary | ICD-10-CM

## 2023-07-03 ENCOUNTER — Other Ambulatory Visit: Payer: Self-pay | Admitting: Physician Assistant

## 2023-07-03 DIAGNOSIS — L4050 Arthropathic psoriasis, unspecified: Secondary | ICD-10-CM

## 2023-07-03 DIAGNOSIS — G894 Chronic pain syndrome: Secondary | ICD-10-CM

## 2023-07-05 NOTE — Telephone Encounter (Signed)
 Requesting rx rf of gabapentin  800mg   Last written 03/19/20256 Last OV 05/31/2023 Upcoming appt 08/03/2023

## 2023-07-05 NOTE — Telephone Encounter (Signed)
 Requesting rx rf of ondansetron  8 mg Last written 04/21/225 Last OV 05/31/2023 Upcoming appt =08/03/2023

## 2023-07-06 ENCOUNTER — Other Ambulatory Visit: Payer: Self-pay | Admitting: Physician Assistant

## 2023-07-06 DIAGNOSIS — K2100 Gastro-esophageal reflux disease with esophagitis, without bleeding: Secondary | ICD-10-CM

## 2023-07-07 ENCOUNTER — Other Ambulatory Visit: Payer: Self-pay | Admitting: Physician Assistant

## 2023-07-09 NOTE — Telephone Encounter (Signed)
 Last OV: 05/31/23 Next OV: 08/03/23 Last RF: 04/21/23

## 2023-07-09 NOTE — Telephone Encounter (Signed)
 Attempted to contact the patient. No answer, left a brief vm msg for the patient to return a call back. Direct call back info provided.

## 2023-07-09 NOTE — Telephone Encounter (Signed)
 We need to assess how to use less of this. It is a control substance not indicated for regular use. Looks like you are taking about 7 a month. We need to get you down to once or twice a month or find something else for rescue especially since you have norco for chronic pain.

## 2023-07-13 NOTE — Telephone Encounter (Signed)
 Ok thank you

## 2023-07-13 NOTE — Telephone Encounter (Signed)
 Patient has been scheduled a virtual visit on 07/16/23 at 1030 am with provider to discuss alternative meds. Unable to come in to the office due to lack of transportation.

## 2023-07-15 ENCOUNTER — Encounter: Payer: Self-pay | Admitting: Physician Assistant

## 2023-07-16 ENCOUNTER — Other Ambulatory Visit: Payer: Self-pay | Admitting: Physician Assistant

## 2023-07-16 ENCOUNTER — Telehealth (INDEPENDENT_AMBULATORY_CARE_PROVIDER_SITE_OTHER): Admitting: Physician Assistant

## 2023-07-16 ENCOUNTER — Telehealth: Payer: Self-pay

## 2023-07-16 ENCOUNTER — Encounter: Payer: Self-pay | Admitting: Physician Assistant

## 2023-07-16 DIAGNOSIS — L089 Local infection of the skin and subcutaneous tissue, unspecified: Secondary | ICD-10-CM

## 2023-07-16 DIAGNOSIS — G43711 Chronic migraine without aura, intractable, with status migrainosus: Secondary | ICD-10-CM

## 2023-07-16 DIAGNOSIS — B9689 Other specified bacterial agents as the cause of diseases classified elsewhere: Secondary | ICD-10-CM | POA: Diagnosis not present

## 2023-07-16 MED ORDER — BUTALBITAL-APAP-CAFFEINE 50-325-40 MG PO TABS: ORAL_TABLET | ORAL | 0 refills | Status: DC

## 2023-07-16 MED ORDER — NURTEC 75 MG PO TBDP: ORAL_TABLET | ORAL | 5 refills | Status: DC

## 2023-07-16 MED ORDER — MUPIROCIN 2 % EX OINT: TOPICAL_OINTMENT | CUTANEOUS | 3 refills | Status: AC

## 2023-07-16 NOTE — Progress Notes (Unsigned)
 Pt reports that she is experiencing 5 out of 7 days of headaches. She said this has been an increase for her.

## 2023-07-16 NOTE — Telephone Encounter (Signed)
 Can we try to find a copy of this form online for me to sign?

## 2023-07-16 NOTE — Telephone Encounter (Signed)
 Pharmacy Patient Advocate Encounter   Received notification from CoverMyMeds that prior authorization for Nurtec 75MG  dispersible tablets is required/requested.   Insurance verification completed.   The patient is insured through [employer] .   Per test claim: PA required; PA submitted to above mentioned insurance via CoverMyMeds Key/confirmation #/EOC [id number] Status is pending

## 2023-07-16 NOTE — Patient Instructions (Signed)
Magnesium 400mg at bedtime

## 2023-07-16 NOTE — Progress Notes (Unsigned)
 ..Virtual Visit via Video Note  I connected with Sarah Mayer on 07/19/23 at 10:30 AM EDT by a video enabled telemedicine application and verified that I am speaking with the correct person using two identifiers.  Location: Patient: home Provider: clinic  .Aaron AasParticipating in visit:  Patient: Sarah Mayer Provider: [name] PA-C   I discussed the limitations of evaluation and management by telemedicine and the availability of in person appointments. The patient expressed understanding and agreed to proceed.  History of Present Illness: Pt is a 53 yo female who calls into the clinic to discuss migraines. She is having migraines almost every day. She is on gabapentin . She cannot take NSAIDs due to anticoagulation. Failed triptans for rescue. Failed topamax for prevention. On propranolol . Pt uses fioricet for breakthrough when tylenol  not working.   Pt gets intermittent infected sores over extremities from her "picking". She would like to have something to treat when they get infected.   .. Active Ambulatory Problems    Diagnosis Date Noted   Primary and psoriatic osteoarthritis of both knees 03/24/2016   Gastroesophageal reflux disease 03/24/2016   GAD (generalized anxiety disorder) 03/24/2016   Iron deficiency anemia 03/24/2016   History of pulmonary embolus (PE) 03/27/2016   History of DVT (deep vein thrombosis) 03/27/2016   Chronic tension-type headache, not intractable 03/27/2016   Primary insomnia 03/27/2016   Bilateral lower extremity edema 03/27/2016   Chronic pain syndrome 05/25/2016   Morbidly obese (HCC) 05/27/2016   Serum calcium elevated 08/30/2016   Diarrhea 08/30/2016   Collagenous colitis 10/18/2016   Barrett's esophagus without dysplasia 10/18/2016   Arthralgia 10/18/2016   Memory changes 11/23/2016   Major depressive disorder with current active episode 11/23/2016   OSA (obstructive sleep apnea) 11/23/2016   Twitching 12/22/2016   Upper airway cough syndrome  12/22/2016   Bilateral hip pain 12/22/2016   Tripping over things 01/12/2017   Left arm pain 01/12/2017   Left elbow pain 01/12/2017   Numbness and tingling in left arm 01/17/2017   Word finding difficulty 03/15/2017   Intractable migraine without aura and with status migrainosus 03/15/2017   Edema of both feet 07/04/2017   B12 deficiency 07/04/2017   Vitamin D  deficiency 07/04/2017   Secondary hyperparathyroidism, non-renal (HCC) 07/04/2017   Psoriasis 07/06/2017   CFS (chronic fatigue syndrome) 07/06/2017   Easy bruising 07/26/2017   Myofascial pain 08/19/2017   Itching 08/19/2017   Yeast infection 08/19/2017   IBS (irritable bowel syndrome) 12/03/2017   Recurrent falls 12/29/2017   Weakness 02/11/2018   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   Urinary frequency 02/14/2018   Chronic left shoulder pain 03/15/2018   Chronic pain of left thumb 03/15/2018   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   Jerking 09/12/2018   Episode of confusion 09/12/2018   Bilateral leg pain 11/14/2018   Dry mouth 11/14/2018   Bilateral leg weakness 01/23/2019   Skin infection 01/23/2019   Chronic nonintractable headache 02/07/2019   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   Intertrigo 08/11/2019   Melasma 08/11/2019   OAB (overactive bladder) 08/11/2019   DDD (degenerative disc disease), lumbar 01/23/2020   Osteoarthritis, hip, bilateral 01/23/2020   Cough, persistent 06/24/2020   COVID-19 long hauler 02/18/2021   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   Hallucinations 07/01/2021   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   Nausea 07/01/2021   Gastroesophageal reflux disease with esophagitis without hemorrhage 07/01/2021   Constipation 10/21/2021   Sternum pain 10/21/2021   Psoriatic arthritis (HCC) 06/03/2022   Open wound of umbilical region 07/03/2022   Class 3 severe obesity  due to excess calories with serious comorbidity and body mass index (BMI) of 50.0 to 59.9 in adult 10/12/2022   Post-viral cough syndrome 01/13/2023   Lymph  node enlargement 03/10/2023   Balance problems 04/14/2023   Recurrent pulmonary embolism (HCC) 04/14/2023   Fibromyalgia 05/03/2023   Rash 05/04/2023   Anticoagulated 05/24/2023   Foot drop, left foot 05/31/2023   Left leg swelling 06/04/2023   Chronic migraine without aura, with intractable migraine, so stated, with status migrainosus 07/19/2023   Resolved Ambulatory Problems    Diagnosis Date Noted   Hyperparathyroidism, primary (HCC) 09/18/2016   Rash and nonspecific skin eruption 10/18/2016   Acquired hypothyroidism 07/04/2017   Acute bronchitis 02/07/2018   Elevated serum creatinine 11/14/2018   Pneumonia of left upper lobe due to Chlamydia species 08/11/2019   Acute cystitis without hematuria 07/10/2020   Past Medical History:  Diagnosis Date   History of pulmonary embolism 03/27/2016   Microscopic colitis    Migraine    Morbid obesity (HCC) 05/27/2016        Observations/Objective: No acute distress Normal mood and appearance   Assessment and Plan: [name] was seen today for headache.  Diagnoses and all orders for this visit:  Chronic migraine without aura, with intractable migraine, so stated, with status migrainosus -     Rimegepant Sulfate (NURTEC) 75 MG TBDP; Take one tablet every other day for migraine prevention and rescue. -     butalbital -acetaminophen -caffeine  (FIORICET) 50-325-40 MG tablet; TAKE 1 TABLET BY MOUTH EVERY 6 HOURS AS NEEDED FOR HEADACHE  Superficial bacterial skin infection -     mupirocin  ointment (BACTROBAN ) 2 %; Apply to affected area twice a day as needed for 5 days at a time.   Failed topamax, propranol and gabapentin  for migraine prevention and imitrex for rescue.  Nurtec every other day for prevention and rescue.  Start magnesium 400mg  at bedtime.  Small quantity of Fioricet for rescue but discussed when need to continue to taper off this.  Bactroban  as needed for superficial skin infection.   Clean wounds with soap and water.      Follow Up Instructions:    I discussed the assessment and treatment plan with the patient. The patient was provided an opportunity to ask questions and all were answered. The patient agreed with the plan and demonstrated an understanding of the instructions.   The patient was advised to call back or seek an in-person evaluation if the symptoms worsen or if the condition fails to improve as anticipated.   Sarah Velador, PA-C

## 2023-07-17 ENCOUNTER — Encounter: Payer: Self-pay | Admitting: Physician Assistant

## 2023-07-17 DIAGNOSIS — G894 Chronic pain syndrome: Secondary | ICD-10-CM

## 2023-07-17 DIAGNOSIS — M797 Fibromyalgia: Secondary | ICD-10-CM

## 2023-07-19 ENCOUNTER — Encounter: Payer: Self-pay | Admitting: Physician Assistant

## 2023-07-19 DIAGNOSIS — G43711 Chronic migraine without aura, intractable, with status migrainosus: Secondary | ICD-10-CM | POA: Insufficient documentation

## 2023-07-20 NOTE — Telephone Encounter (Signed)
 Forwarding message to Dr. Greer Leak covering [name]

## 2023-07-20 NOTE — Telephone Encounter (Signed)
 Pharmacy Patient Advocate Encounter  Received notification from [employer] that Prior Authorization for Nurtec 75mg  has been DENIED.  Full denial letter will be uploaded to the media tab. See denial reason below.   PA #/Case ID/Reference #: [id number]

## 2023-07-21 NOTE — Addendum Note (Signed)
 Addended by: Kariann Wecker D on: 07/21/2023 10:08 PM   Modules accepted: Orders

## 2023-07-21 NOTE — Telephone Encounter (Signed)
 Orders Placed This Encounter  Procedures  . Ambulatory referral to Physical Therapy    Referral Priority:   Routine    Referral Type:   Physical Medicine    Referral Reason:   Specialty Services Required    Requested Specialty:   Physical Therapy    Number of Visits Requested:   1

## 2023-07-21 NOTE — Telephone Encounter (Signed)
 Let her know the insurance equire trial fo emgality   Also can you check with her is she on Trintellex and Cymbalta ?   Is she on Aciphex  BId and Pepcid  BID?

## 2023-07-22 ENCOUNTER — Encounter: Payer: Self-pay | Admitting: Physician Assistant

## 2023-07-22 NOTE — Telephone Encounter (Signed)
 Attempted to contact the patient, no answer. Left a detailed vm msg regarding the provider's note. Direct call back info provided.

## 2023-07-25 ENCOUNTER — Encounter: Payer: Self-pay | Admitting: Physician Assistant

## 2023-07-25 DIAGNOSIS — L4050 Arthropathic psoriasis, unspecified: Secondary | ICD-10-CM

## 2023-07-25 DIAGNOSIS — G894 Chronic pain syndrome: Secondary | ICD-10-CM

## 2023-07-26 ENCOUNTER — Telehealth: Payer: Self-pay

## 2023-07-26 MED ORDER — HYDROCODONE-ACETAMINOPHEN 10-325 MG PO TABS: 1.0000 | ORAL_TABLET | Freq: Two times a day (BID) | ORAL | 0 refills | Status: DC

## 2023-07-26 NOTE — Telephone Encounter (Signed)
 Form was printed and given to [name] to sign.

## 2023-07-26 NOTE — Telephone Encounter (Signed)
 Copied from CRM [id number]. Topic: Clinical - Prescription Issue >> Jul 26, 2023  1:44 PM Madelyne Schiff wrote: Sarah Mayer with [facility] ask the you please call him regarding patients medications:  1. Butalbital -acetaminophen -caffeine  (FIORICET) 50-325-40 MG tablet 2. HYDROcodone -acetaminophen  (NORCO) 10-325 MG tablet  And also please call about patient switching from [facility] to [facility]

## 2023-07-26 NOTE — Addendum Note (Signed)
 Addended by: Araceli Knight on: 07/26/2023 01:05 PM   Modules accepted: Orders

## 2023-07-26 NOTE — Telephone Encounter (Signed)
 Spoke with [facility] pharmacy. Cancelled prescription for hydrococone acet   at [facility] as was sent in error.

## 2023-07-26 NOTE — Telephone Encounter (Signed)
..  PDMP reviewed during this encounter. No concerns Last fill 4/21 Sent norco On pain contrat Next appt 6/10

## 2023-07-26 NOTE — Telephone Encounter (Signed)
 I sent

## 2023-07-31 ENCOUNTER — Other Ambulatory Visit: Payer: Self-pay | Admitting: Physician Assistant

## 2023-07-31 DIAGNOSIS — G43011 Migraine without aura, intractable, with status migrainosus: Secondary | ICD-10-CM

## 2023-07-31 DIAGNOSIS — R053 Chronic cough: Secondary | ICD-10-CM

## 2023-08-03 ENCOUNTER — Other Ambulatory Visit (HOSPITAL_COMMUNITY): Payer: Self-pay

## 2023-08-03 ENCOUNTER — Telehealth: Payer: Self-pay | Admitting: Physician Assistant

## 2023-08-03 ENCOUNTER — Telehealth: Payer: Self-pay

## 2023-08-03 ENCOUNTER — Ambulatory Visit: Admitting: Physician Assistant

## 2023-08-03 VITALS — BP 107/70 | HR 64 | Ht 60.0 in | Wt 259.0 lb

## 2023-08-03 DIAGNOSIS — M79672 Pain in left foot: Secondary | ICD-10-CM

## 2023-08-03 DIAGNOSIS — G894 Chronic pain syndrome: Secondary | ICD-10-CM | POA: Diagnosis not present

## 2023-08-03 DIAGNOSIS — L4050 Arthropathic psoriasis, unspecified: Secondary | ICD-10-CM

## 2023-08-03 DIAGNOSIS — M170 Bilateral primary osteoarthritis of knee: Secondary | ICD-10-CM | POA: Diagnosis not present

## 2023-08-03 MED ORDER — HYDROCODONE-ACETAMINOPHEN 10-325 MG PO TABS: 1.0000 | ORAL_TABLET | Freq: Two times a day (BID) | ORAL | 0 refills | Status: DC

## 2023-08-03 MED ORDER — PREDNISONE 50 MG PO TABS: ORAL_TABLET | ORAL | 0 refills | Status: DC

## 2023-08-03 MED ORDER — FOLIC ACID 1 MG PO TABS: 1.0000 mg | ORAL_TABLET | Freq: Every day | ORAL | 3 refills | Status: AC

## 2023-08-03 NOTE — Telephone Encounter (Signed)
 Pharmacy Patient Advocate Encounter  Received notification from [employer] that Prior Authorization for Famotidine  40mg  tabs has been CANCELLED due to Prior Authorization is not required   PA #/Case ID/Reference #: [id number]  Per test claim, 3 month supply preferred.   Currently a quantity of 180 tabs is a 90 day supply and the co-pay is $0 .   This test claim was processed through [facility]- copay amounts may vary at other pharmacies due to pharmacy/plan contracts, or as the patient moves through the different stages of their insurance plan.     Placed a call to [facility] and per the pharmacist, the order did not have enough to cover a 3 month supply.   Please send in a new order.

## 2023-08-03 NOTE — Progress Notes (Signed)
 Established Patient Office Visit  Subjective   Patient ID: Sarah Mayer, female    DOB: 17-Sep-1970  Age: 53 y.o. MRN: [id number]  Chief Complaint  Patient presents with   Pain Management   Hallucinations    HPI Pt is a 53 yo obese female who presents to the clinic for 3 month pain follow up.   She is taking norco bid. She is doing ok. She stills see rheumatology but not having great results with [facility]. Her hands have lost a lot of function. She is going to have one joint repaired to see if she can get some ROM back.   She continues to have migraines. Nurtec has not been able to be approved via insurance. She does still use fioricet as needed. Reports norco does not help her migraines.    Pt had ran out of trintellix  and visual hallcuinations had started. She went back on and they have nearly resolved and gotten a lot better.   Pt is having some left dorsal foot pain. No trauma or injury noted. No bruising, swelling, redness. It hurts to move left foot up and down and mainly over dorsal foot.    ROS See HPI.    Objective:     BP 107/70 (BP Location: Left Arm, Patient Position: Sitting, Cuff Size: Large)   Pulse 64   Ht 5' (1.524 m)   Wt 259 lb (117.5 kg)   SpO2 96%   BMI 50.58 kg/m  BP Readings from Last 3 Encounters:  08/03/23 107/70  05/31/23 105/63  05/18/23 119/67   Wt Readings from Last 3 Encounters:  08/03/23 259 lb (117.5 kg)  12/01/22 259 lb (117.5 kg)  10/12/22 259 lb (117.5 kg)      Physical Exam Constitutional:      Appearance: Normal appearance. She is obese.  HENT:     Head: Normocephalic.   Cardiovascular:     Rate and Rhythm: Normal rate.  Pulmonary:     Effort: Pulmonary effort is normal.   Musculoskeletal:     Right lower leg: No edema.     Left lower leg: No edema.     Comments: No bruising, swelling, redness of bilateral feet. Pain over dorsal left foot to palpation. Pain with dorsiflexion of left foot.   Bony deformities  of bilateral hands.    Neurological:     General: No focal deficit present.     Mental Status: She is alert and oriented to person, place, and time.   Psychiatric:        Mood and Affect: Mood normal.       The 10-year ASCVD risk score (Arnett DK, et al., 2019) is: 0.8%    Assessment & Plan:  Sarah Mayer was seen today for pain management and hallucinations.  Diagnoses and all orders for this visit:  Chronic pain syndrome -     HYDROcodone -acetaminophen  (NORCO) 10-325 MG tablet; Take 1 tablet by mouth every 12 (twelve) hours. -     HYDROcodone -acetaminophen  (NORCO) 10-325 MG tablet; Take 1 tablet by mouth every 12 (twelve) hours. -     HYDROcodone -acetaminophen  (NORCO) 10-325 MG tablet; Take 1 tablet by mouth every 12 (twelve) hours. -     Drug Screen 12+Alcohol+CRT, Ur -     Specimen status report  Psoriatic arthritis (HCC) -     HYDROcodone -acetaminophen  (NORCO) 10-325 MG tablet; Take 1 tablet by mouth every 12 (twelve) hours. -     HYDROcodone -acetaminophen  (NORCO) 10-325 MG tablet; Take 1 tablet by mouth  every 12 (twelve) hours. -     HYDROcodone -acetaminophen  (NORCO) 10-325 MG tablet; Take 1 tablet by mouth every 12 (twelve) hours. -     Drug Screen 12+Alcohol+CRT, Ur -     Specimen status report  Primary and psoriatic osteoarthritis of both knees -     folic acid  (FOLVITE ) 1 MG tablet; Take 1 tablet (1 mg total) by mouth daily.  Left foot pain -     predniSONE  (DELTASONE ) 50 MG tablet; Take one tablet daily for 5 days.   Reviewed pain contract and signed today .Sarah AasPDMP reviewed during this encounter. No concerns UDS ordered today Refilled norco for 3 months Follow up in 3 months  Discussed fioricet and how it is not indicated to keep her in this for rescue.  Consider tylenol  headaches Cannot take NSaIDs. Pt aware this may not be refilled again.  She is only getting a small quantity.  Insurance is not wanting to pay for nurtec.   Stay on trintellix  and  wellbutrin  and buspar  and cymbalt  Discussed left dorsal foot pain Seems more like extensor tendons She was a boot at home she can wear Start exercises Burst of prednisone  for 5 days given Discussed rest, ice, compression and elevation.    Return in about 3 months (around 11/03/2023).    Dina Warbington, PA-C

## 2023-08-03 NOTE — Patient Instructions (Addendum)
 Stop wellbutrin  and see if helps with headaches.  Will attempt to appeal for nurtec for rescue  Norco refilled Prednisone  once daily for 5 days.

## 2023-08-03 NOTE — Telephone Encounter (Signed)
 Per E2C2 agent. Bearl Limes, pharmacist with [facility] states he does not feel comfortable filling rx for hydrocodone  for this patient.  He states he has spoken to pcp about this before. Pharmacist states he prefers pcp to send rx to [facility].

## 2023-08-03 NOTE — Telephone Encounter (Signed)
 Pharmacy Patient Advocate Encounter   Received notification from Patient Pharmacy that prior authorization for Famotidine  40mg   is required/requested.   Insurance verification completed.   The patient is insured through [employer] .   Per test claim: PA required; PA submitted to above mentioned insurance via CoverMyMeds Key/confirmation #/EOC [id number] Status is pending

## 2023-08-05 LAB — SPECIMEN STATUS REPORT

## 2023-08-06 ENCOUNTER — Other Ambulatory Visit: Payer: Self-pay | Admitting: Physician Assistant

## 2023-08-06 ENCOUNTER — Encounter: Payer: Self-pay | Admitting: Physician Assistant

## 2023-08-06 DIAGNOSIS — M79672 Pain in left foot: Secondary | ICD-10-CM | POA: Insufficient documentation

## 2023-08-06 NOTE — Progress Notes (Signed)
 I have been made aware that patient requested [facility]co [facility] go to [facility] then [facility]. She now wants [facility] to [facility] only. Will need to change pain contract at next visit. PMDP shows only getting norco once a month.

## 2023-08-06 NOTE — Progress Notes (Signed)
..  PDMP reviewed during this encounter. Pt is not abusing her medications and is on pain contract with us  here in clinic.  [facility] has flagged me but [facility] get [facility].

## 2023-08-06 NOTE — Telephone Encounter (Signed)
 Attempted to reach pt, left detailed vm to return  call back to clinic

## 2023-08-07 ENCOUNTER — Other Ambulatory Visit: Payer: Self-pay | Admitting: Physician Assistant

## 2023-08-07 DIAGNOSIS — G894 Chronic pain syndrome: Secondary | ICD-10-CM

## 2023-08-07 DIAGNOSIS — L4050 Arthropathic psoriasis, unspecified: Secondary | ICD-10-CM

## 2023-08-10 NOTE — Telephone Encounter (Signed)
 Please advise on refill request

## 2023-08-14 ENCOUNTER — Other Ambulatory Visit: Payer: Self-pay | Admitting: Physician Assistant

## 2023-08-14 DIAGNOSIS — F419 Anxiety disorder, unspecified: Secondary | ICD-10-CM

## 2023-08-14 DIAGNOSIS — M7918 Myalgia, other site: Secondary | ICD-10-CM

## 2023-08-16 NOTE — Telephone Encounter (Signed)
 Please advise on refill request

## 2023-08-22 ENCOUNTER — Other Ambulatory Visit: Payer: Self-pay | Admitting: Physician Assistant

## 2023-08-22 DIAGNOSIS — G43011 Migraine without aura, intractable, with status migrainosus: Secondary | ICD-10-CM

## 2023-08-22 DIAGNOSIS — J302 Other seasonal allergic rhinitis: Secondary | ICD-10-CM

## 2023-08-24 ENCOUNTER — Encounter: Payer: Self-pay | Admitting: Physician Assistant

## 2023-08-24 DIAGNOSIS — L4050 Arthropathic psoriasis, unspecified: Secondary | ICD-10-CM

## 2023-08-24 DIAGNOSIS — G894 Chronic pain syndrome: Secondary | ICD-10-CM

## 2023-08-28 ENCOUNTER — Encounter: Payer: Self-pay | Admitting: Physician Assistant

## 2023-08-28 MED ORDER — HYDROCODONE-ACETAMINOPHEN 10-325 MG PO TABS: 1.0000 | ORAL_TABLET | Freq: Two times a day (BID) | ORAL | 0 refills | Status: DC

## 2023-08-28 MED ORDER — HYDROCODONE-ACETAMINOPHEN 10-325 MG PO TABS: 1.0000 | ORAL_TABLET | Freq: Two times a day (BID) | ORAL | 0 refills | Status: AC

## 2023-08-28 NOTE — Telephone Encounter (Signed)
 Meds sent. We just need to call other pharmacy and make sure they are canceled.   Meds ordered this encounter  Medications   HYDROcodone -acetaminophen  (NORCO) 10-325 MG tablet    Sig: Take 1 tablet by mouth every 12 (twelve) hours.    Dispense:  60 tablet    Refill:  0    Chronic pain contract   HYDROcodone -acetaminophen  (NORCO) 10-325 MG tablet    Sig: Take 1 tablet by mouth every 12 (twelve) hours.    Dispense:  60 tablet    Refill:  0    Chronic pain contract   HYDROcodone -acetaminophen  (NORCO) 10-325 MG tablet    Sig: Take 1 tablet by mouth every 12 (twelve) hours.    Dispense:  60 tablet    Refill:  0    Chronic pain contract

## 2023-08-29 ENCOUNTER — Encounter: Payer: Self-pay | Admitting: Physician Assistant

## 2023-09-08 ENCOUNTER — Other Ambulatory Visit: Payer: Self-pay | Admitting: Physician Assistant

## 2023-09-08 DIAGNOSIS — L304 Erythema intertrigo: Secondary | ICD-10-CM

## 2023-09-10 ENCOUNTER — Ambulatory Visit: Payer: Self-pay

## 2023-09-10 NOTE — Telephone Encounter (Signed)
 FYI Only or Action Required?: FYI only for provider.  Patient was last seen in primary care on 08/03/2023 by Antoniette Vermell CROME, PA-C.  Called Nurse Triage reporting Urinary Frequency.  Symptoms began several days ago.  Interventions attempted: Rest, hydration, or home remedies.  Symptoms are: unchanged.  Triage Disposition: See Physician Within 24 Hours  Patient/caregiver understands and will follow disposition?: Yes  **Appt. Scheduled for 7/21**                              Copied from CRM #[id number]. Topic: Clinical - Red Word Triage >> Sep 10, 2023  4:44 PM Mercer PEDLAR wrote: Red Word that prompted transfer to Nurse Triage: Possible UTI. Reason for Disposition  Urinating more frequently than usual (i.e., frequency) OR new-onset of the feeling of an urgent need to urinate (i.e., urgency)  Answer Assessment - Initial Assessment Questions 1. SYMPTOM: What's the main symptom you're concerned about? (e.g., frequency, incontinence)     Unable to empty bladder fully, urinary discomfort, tingling  2. ONSET: When did the  symptoms  start?     X 3 days   3. PAIN: Is there any pain? If Yes, ask: How bad is it? (Scale: 1-10; mild, moderate, severe)      No   4. CAUSE: What do you think is causing the symptoms?     Unknown    5. OTHER SYMPTOMS: Do you have any other symptoms? (e.g., blood in urine, fever, flank pain, pain with urination)  No   Drinking cranberry juice.  Protocols used: Urinary Symptoms-A-AH

## 2023-09-13 ENCOUNTER — Ambulatory Visit (INDEPENDENT_AMBULATORY_CARE_PROVIDER_SITE_OTHER): Admitting: Physician Assistant

## 2023-09-13 ENCOUNTER — Encounter: Payer: Self-pay | Admitting: Physician Assistant

## 2023-09-13 VITALS — BP 117/84 | HR 105 | Temp 98.7°F | Ht 60.0 in | Wt 259.0 lb

## 2023-09-13 DIAGNOSIS — L304 Erythema intertrigo: Secondary | ICD-10-CM

## 2023-09-13 DIAGNOSIS — R296 Repeated falls: Secondary | ICD-10-CM

## 2023-09-13 DIAGNOSIS — R300 Dysuria: Secondary | ICD-10-CM | POA: Diagnosis not present

## 2023-09-13 DIAGNOSIS — N3000 Acute cystitis without hematuria: Secondary | ICD-10-CM

## 2023-09-13 DIAGNOSIS — G43711 Chronic migraine without aura, intractable, with status migrainosus: Secondary | ICD-10-CM

## 2023-09-13 DIAGNOSIS — R2689 Other abnormalities of gait and mobility: Secondary | ICD-10-CM | POA: Diagnosis not present

## 2023-09-13 DIAGNOSIS — N3001 Acute cystitis with hematuria: Secondary | ICD-10-CM

## 2023-09-13 DIAGNOSIS — L4050 Arthropathic psoriasis, unspecified: Secondary | ICD-10-CM

## 2023-09-13 LAB — POCT URINALYSIS DIP (CLINITEK)
Bilirubin, UA: NEGATIVE
Blood, UA: NEGATIVE
Glucose, UA: NEGATIVE mg/dL
Ketones, POC UA: NEGATIVE mg/dL
Nitrite, UA: POSITIVE — AB
POC PROTEIN,UA: 100 — AB
Spec Grav, UA: >=1.03 — AB (ref 1.010–1.025)
Urobilinogen, UA: 2 U/dL — AB
pH, UA: 6 (ref 5.0–8.0)

## 2023-09-13 MED ORDER — NYSTATIN 100000 UNIT/GM EX OINT
1.0000 | TOPICAL_OINTMENT | Freq: Two times a day (BID) | CUTANEOUS | 1 refills | Status: AC
Start: 2023-09-13 — End: ?

## 2023-09-13 MED ORDER — FLUCONAZOLE 150 MG PO TABS: ORAL_TABLET | ORAL | 0 refills | Status: DC

## 2023-09-13 MED ORDER — NITROFURANTOIN MONOHYD MACRO 100 MG PO CAPS: 100.0000 mg | ORAL_CAPSULE | Freq: Two times a day (BID) | ORAL | 0 refills | Status: DC

## 2023-09-13 NOTE — Progress Notes (Signed)
 Acute Office Visit  Subjective:     Patient ID: Sarah Mayer, female    DOB: Jan 11, 1971, 53 y.o.   MRN: [id number]  Chief Complaint  Patient presents with   Dysuria    Urinary Symptoms     Patient is in today for urinary tract infection symptoms and a yeast infection. Patient states that se began feeling UTI symptoms last Thursday or Friday, with increased frequency, pressure, and pain with urination. She states it feels like other urinary tract infections she has had in the past and states that she will often get them after a round of antibiotics or steroids. Patient states she took a prescribed steroids a few week ago which may have triggered this. Her symptoms have remained the same throughout the weekend and she states she has been drinking cranberry juice to help.  Patient also states she noticed a rash developing between her stomach and thighs for the past week, which she believes is a yeast infection. She has had these before, and states that it is worse on the left side than the right side, but that there are red dots and the areas are very moist. She states with the hot weather, this has gotten worse. She has been using some left over ketoconazole  on the area which has helped very little. She also just got an antifungal ointment at the store for this to try and help.   Patient admits to having some falls recently at home. Patient does not ambulate very often, but endorses that she struggles whenever she does. She has not tripped over anything, but states that she will 'just go down'. She feels very weak, and states that she needs to build her strength back up, which she hope aquatic PT (starting today) will be helpful.   Patient also expresses concern over migraine medication not being approved by her insurance. She states her symptoms fluctuate, but her husband states that some weeks she will have a migraine every day. Patient cannot tolerate Topamax, and is currently taking  Firocet.  Review of Systems  Constitutional:  Negative for chills and fever.  Genitourinary:  Positive for dysuria, frequency and urgency. Negative for flank pain and hematuria.  Skin:  Positive for itching and rash.        Objective:    BP 117/84   Pulse (!) 105   Temp 98.7 F (37.1 C) (Oral)   Ht 5' (1.524 m)   Wt 259 lb (117.5 kg)   SpO2 99%   BMI 50.58 kg/m  BP Readings from Last 3 Encounters:  09/13/23 117/84  08/03/23 107/70  05/31/23 105/63      Physical Exam Constitutional:      Appearance: Normal appearance. She is obese.     Comments: Pt is in wheelchair  Cardiovascular:     Rate and Rhythm: Regular rhythm. Tachycardia present.     Pulses: Normal pulses.  Pulmonary:     Effort: Pulmonary effort is normal.  Musculoskeletal:     Comments: ROM limited due to muscular atrophy. Patient is in wheel chair.  Skin:    General: Skin is warm.     Findings: Lesion and rash present.     Comments: Satellite lesions, erythema, and moisture present underneath pannus on both left and right sides. Left side is worse than right.   Neurological:     Mental Status: She is alert.     Results for orders placed or performed in visit on 09/13/23  POCT URINALYSIS DIP (CLINITEK)  Result Value Ref Range   Color, UA other (A) yellow   Clarity, UA cloudy (A) clear   Glucose, UA negative negative mg/dL   Bilirubin, UA negative negative   Ketones, POC UA negative negative mg/dL   Spec Grav, UA >=8.969 (A) 1.010 - 1.025   Blood, UA negative negative   pH, UA 6.0 5.0 - 8.0   POC PROTEIN,UA =100 (A) negative, trace   Urobilinogen, UA 2.0 (A) 0.2 or 1.0 E.U./dL   Nitrite, UA Positive (A) Negative   Leukocytes, UA Moderate (2+) (A) Negative        Assessment & Plan:  Sarah Mayer was seen today for dysuria.  Diagnoses and all orders for this visit:  Acute cystitis without hematuria -     nitrofurantoin , macrocrystal-monohydrate, (MACROBID ) 100 MG capsule; Take 1 capsule  (100 mg total) by mouth 2 (two) times daily.  Dysuria -     POCT URINALYSIS DIP (CLINITEK) -     Urine Culture  Intertrigo -     nystatin  ointment (MYCOSTATIN ); Apply 1 Application topically 2 (two) times daily. -     fluconazole  (DIFLUCAN ) 150 MG tablet; Take one tablet now and then one tablet after antibiotic.  Balance problems -     For home use only DME 4 wheeled rolling walker with seat ([id number])  Recurrent falls -     For home use only DME 4 wheeled rolling walker with seat ([id number])  Chronic migraine without aura, with intractable migraine, so stated, with status migrainosus -     AMB Referral [facility] Care Management  Morbid obesity (HCC) -     For home use only DME 4 wheeled rolling walker with seat ([id number])  Psoriatic arthritis (HCC)   UA positive for nitrites,leukocytes, protein Treated with macrobid  Will culture urine Oral diflucan  to take one tablet now and then when finish abx Topical nystatin  ointment sent to pharmacy Discussed importance of keeping skin folds dry Consider interdry as needed OTC Concerned with frequent falls and bilateral leg strength She is basically non-ambulatory She has referral for water therapy and her husband states he is going to start working with her She does need walker for use in the home due to recurrent falls/bilateral leg weakness/psoriatic arthritis/obesity - Encouraged patient to attempt body weight exercises that she can do from the wheel chair that do not require help  - Start aquatic physical therapy   Migraines  - Nurtec not approved by insurance, will send in again and contact [place] to see if this can be approved - Continue using Firocet but sparingly  Return if symptoms worsen or fail to improve.  Ethelene Closser, PA-C

## 2023-09-13 NOTE — Patient Instructions (Addendum)
 Interdry patches Use nystatin  ointment as needed Diflucan  one tablet now and then after antibiotic Start macrobid  for UTI. Will order a walker to help with mobility at home and decrease fall risk  Urinary Tract Infection, Female A urinary tract infection (UTI) is an infection in your urinary tract. The urinary tract is made up of organs that make, store, and get rid of pee (urine) in your body. These organs include: The kidneys. The ureters. The bladder. The urethra. What are the causes? Most UTIs are caused by germs called bacteria. They may be in or near your genitals. These germs grow and cause swelling in your urinary tract. What increases the risk? You're more likely to get a UTI if: You're a female. The urethra is shorter in females than in males. You have a soft tube called a catheter that drains your pee. You can't control when you pee or poop. You have trouble peeing because of: A kidney stone. A urinary blockage. A nerve condition that affects your bladder. Not getting enough to drink. You're sexually active. You use a birth control inside your vagina, like spermicide. You're pregnant. You have low levels of the hormone estrogen in your body. You're an older adult. You're also more likely to get a UTI if you have other health problems. These may include: Diabetes. A weak immune system. Your immune system is your body's defense system. Sickle cell disease. Injury of the spine. What are the signs or symptoms? Symptoms may include: Needing to pee right away. Peeing small amounts often. Pain or burning when you pee. Blood in your pee. Pee that smells bad or odd. Pain in your belly or lower back. You may also: Feel confused. This may be the first symptom in older adults. Vomit. Not feel hungry. Feel tired or easily annoyed. Have a fever or chills. How is this diagnosed? A UTI is diagnosed based on your medical history and an exam. You may also have other tests.  These may include: Pee tests. Blood tests. Tests for sexually transmitted infections (STIs). If you've had more than one UTI, you may need to have imaging studies done to find out why you keep getting them. How is this treated? A UTI can be treated by: Taking antibiotics or other medicines. Drinking enough fluid to keep your pee pale yellow. In rare cases, a UTI can cause a very bad condition called sepsis. Sepsis may be treated in the hospital. Follow these instructions at home: Medicines Take your medicines only as told by your health care provider. If you were given antibiotics, take them as told by your provider. Do not stop taking them even if you start to feel better. General instructions Make sure you: Pee often and fully. Do not hold your pee for a long time. Wipe from front to back after you pee or poop. Use each tissue only once when you wipe. Pee after you have sex. Do not douche or use sprays or powders in your genital area. Contact a health care provider if: Your symptoms don't get better after 1-2 days of taking antibiotics. Your symptoms go away and then come back. You have a fever or chills. You vomit or feel like you may vomit. Get help right away if: You have very bad pain in your back or lower belly. You faint. This information is not intended to replace advice given to you by your health care provider. Make sure you discuss any questions you have with your health care provider. Document Revised:  01/20/2023 Document Reviewed: 05/15/2022 Elsevier Patient Education  2025 [employer].

## 2023-09-14 ENCOUNTER — Encounter: Payer: Self-pay | Admitting: Physician Assistant

## 2023-09-16 LAB — URINE CULTURE

## 2023-09-17 ENCOUNTER — Ambulatory Visit: Payer: Self-pay | Admitting: Physician Assistant

## 2023-09-17 NOTE — Progress Notes (Signed)
 Attempted call to patient. Left a detailed voice mail message on home # ( allowed per DPR)

## 2023-09-17 NOTE — Progress Notes (Signed)
 Urine culture is back and macrobid  should treat infection.

## 2023-09-18 ENCOUNTER — Other Ambulatory Visit: Payer: Self-pay | Admitting: Physician Assistant

## 2023-09-18 DIAGNOSIS — R053 Chronic cough: Secondary | ICD-10-CM

## 2023-09-22 ENCOUNTER — Telehealth: Payer: Self-pay | Admitting: *Deleted

## 2023-09-22 ENCOUNTER — Encounter: Payer: Self-pay | Admitting: Physician Assistant

## 2023-09-22 NOTE — Progress Notes (Unsigned)
 Care Guide Pharmacy Note  09/22/2023 Name: Henna Derderian MRN: [id number] DOB: [birth year]  Referred By: Antoniette Vermell CROME, PA-C Reason for referral: Call Attempt #1 and Complex Care Management (Outreach to schedule referral with pharmacist )   Donis Kotowski is a 53 y.o. year old female who is a primary care patient of Antoniette, Jade L, PA-C.  Sibley Rolison was referred to the pharmacist for assistance related to: Migraine   An unsuccessful telephone outreach was attempted today to contact the patient who was referred to the pharmacy team for assistance with medication assistance. Additional attempts will be made to contact the patient.  Thedford Franks, CMA [place]  [facility], [facility] Guide Direct Dial: [phone]  Fax: [phone] Website: [email or web address]

## 2023-09-23 NOTE — Progress Notes (Unsigned)
 Care Guide Pharmacy Note  09/23/2023 Name: Sarah Mayer MRN: [id number] DOB: Nov 24, 1970  Referred By: Antoniette Vermell CROME, PA-C Reason for referral: Call Attempt #1 and Complex Care Management (Outreach to schedule referral with pharmacist )   Sarah Mayer is a 53 y.o. year old female who is a primary care patient of Antoniette, Jade L, PA-C.  Sarah Mayer was referred to the pharmacist for assistance related to: Migraine   A second unsuccessful telephone outreach was attempted today to contact the patient who was referred to the pharmacy team for assistance with medication assistance. Additional attempts will be made to contact the patient.  Thedford Franks, CMA [place]  [facility], [facility] Guide Direct Dial: [phone]  Fax: [phone] Website: [email or web address]

## 2023-09-24 ENCOUNTER — Other Ambulatory Visit: Payer: Self-pay | Admitting: Physician Assistant

## 2023-09-24 DIAGNOSIS — J302 Other seasonal allergic rhinitis: Secondary | ICD-10-CM

## 2023-09-24 DIAGNOSIS — F419 Anxiety disorder, unspecified: Secondary | ICD-10-CM

## 2023-09-24 DIAGNOSIS — G43011 Migraine without aura, intractable, with status migrainosus: Secondary | ICD-10-CM

## 2023-09-24 DIAGNOSIS — M7918 Myalgia, other site: Secondary | ICD-10-CM

## 2023-09-24 NOTE — Progress Notes (Signed)
 Care Guide Pharmacy Note  09/24/2023 Name: Jerzey Komperda MRN: [id number] DOB: 23-Oct-1970  Referred By: Antoniette Vermell CROME, PA-C Reason for referral: Call Attempt #1 and Complex Care Management (Outreach to schedule referral with pharmacist )   Ilo Beamon is a 53 y.o. year old female who is a primary care patient of Antoniette, Jade L, PA-C.  Jacquelina Hewins was referred to the pharmacist for assistance related to: Migraine  A third unsuccessful telephone outreach was attempted today to contact the patient who was referred to the pharmacy team for assistance with medication management. The Population Health team is pleased to engage with this patient at any time in the future upon receipt of referral and should he/she be interested in assistance from the Population Health team.  Thedford Franks, CMA [facility]  [facility], [facility] Guide Direct Dial: [phone]  Fax: [phone] Website: [email or web address]

## 2023-10-01 ENCOUNTER — Encounter: Payer: Self-pay | Admitting: Physician Assistant

## 2023-10-03 ENCOUNTER — Encounter: Payer: Self-pay | Admitting: Physician Assistant

## 2023-10-06 ENCOUNTER — Other Ambulatory Visit (HOSPITAL_COMMUNITY): Payer: Self-pay

## 2023-10-06 ENCOUNTER — Telehealth: Payer: Self-pay

## 2023-10-06 NOTE — Telephone Encounter (Signed)
 Pharmacy Patient Advocate Encounter  Received notification from [employer] that Prior Authorization for Hydrocodone /APAP 10/325mg  has been DENIED.  See denial reason below. No denial letter attached in CMM. Will attach denial letter to Media tab once received.   PA #/Case ID/Reference #: [id number]

## 2023-10-07 ENCOUNTER — Other Ambulatory Visit: Payer: Self-pay | Admitting: Physician Assistant

## 2023-10-07 DIAGNOSIS — F5101 Primary insomnia: Secondary | ICD-10-CM

## 2023-10-08 MED ORDER — NURTEC 75 MG PO TBDP: ORAL_TABLET | ORAL | 2 refills | Status: DC

## 2023-10-08 NOTE — Telephone Encounter (Signed)
nurtec 

## 2023-10-08 NOTE — Telephone Encounter (Signed)
 Pt will have to pain cash pay.

## 2023-10-09 ENCOUNTER — Encounter: Payer: Self-pay | Admitting: Physician Assistant

## 2023-10-11 NOTE — Progress Notes (Signed)
   10/11/2023  Patient ID: Sarah Mayer, female   DOB: Jul 20, 1970, 53 y.o.   MRN: [id number]  Prior authorization requests sent via CoverMyMeds to both of patient's insurance plans (Keys:  [id number], [id number]) along with documentation of all other medications tried/failed.  I will monitor progress to keep patient and provider informed.  [name], PharmD, DPLA

## 2023-10-11 NOTE — Telephone Encounter (Signed)
 Last fill 03/08/23 last visit 09/13/23 next visit 11/02/23

## 2023-10-13 NOTE — Progress Notes (Signed)
   10/13/2023  Patient ID: Sarah Mayer, female   DOB: [birth year], 53 y.o.   MRN: [id number]  Medicaid has denied the prior authorization for Nurtec based on the following: -Patients must have not had more than 15 headache days per month during the past 6 months -Patients must have tried rizatriptan or sumatriptan in the past An eppeal is available if the patient does meet the listed criteria.  Request was originally submitted stating patient had tried/failed triptan therapy previously but did not list exact ones.  Information also did not specify how many days/month patient is currently experiencing migraines.    Attempted to contact patient to obtain more detailed information to submit with eappeal, but I was not able to reach her.  HIPAA compliant voicemail was left with my direct phone number, and I am also sending a MyChart message.  [name], PharmD, DPLA

## 2023-10-15 ENCOUNTER — Telehealth: Payer: Self-pay

## 2023-10-15 NOTE — Progress Notes (Signed)
   10/15/2023  Patient ID: Sarah Mayer, female   DOB: 10-17-1970, 53 y.o.   MRN: [id number]  Nurtec PA denied by Medicaid, stating patient must have first tried rizatriptan and sumatriptan and have <15 headaches per month over the past 6 months.  Contacted Sarah Mayer, and she confirmed she used rizatriptan tablets in the past, but these were not effective.  She has also used sumatriptan tablets and injections in the past, and both formulations caused nausea, vomiting, and near syncope.  Patient also endorses having approximately 3 migraines per week, or an average of 12 per month.  Contacted insurance to initiate appeal, but they state appeal request form must be completed and faxed in, along with patient signature authorizing me to represent her and submit the appeal.  Sending patient a MyChart message to see if she can see me in person at [facility] 9/28 to sign appeal request.  [name], PharmD, DPLA

## 2023-10-16 ENCOUNTER — Encounter: Payer: Self-pay | Admitting: Physician Assistant

## 2023-10-16 DIAGNOSIS — G43711 Chronic migraine without aura, intractable, with status migrainosus: Secondary | ICD-10-CM

## 2023-10-20 MED ORDER — BUTALBITAL-APAP-CAFFEINE 50-325-40 MG PO TABS: ORAL_TABLET | ORAL | 0 refills | Status: AC

## 2023-10-26 ENCOUNTER — Encounter: Payer: Self-pay | Admitting: Sports Medicine

## 2023-10-30 ENCOUNTER — Other Ambulatory Visit: Payer: Self-pay | Admitting: Physician Assistant

## 2023-10-30 DIAGNOSIS — G43011 Migraine without aura, intractable, with status migrainosus: Secondary | ICD-10-CM

## 2023-10-30 DIAGNOSIS — J302 Other seasonal allergic rhinitis: Secondary | ICD-10-CM

## 2023-10-30 DIAGNOSIS — K219 Gastro-esophageal reflux disease without esophagitis: Secondary | ICD-10-CM

## 2023-11-02 ENCOUNTER — Other Ambulatory Visit: Payer: Self-pay | Admitting: Physician Assistant

## 2023-11-02 ENCOUNTER — Ambulatory Visit: Admitting: Physician Assistant

## 2023-11-02 DIAGNOSIS — L4050 Arthropathic psoriasis, unspecified: Secondary | ICD-10-CM

## 2023-11-02 DIAGNOSIS — F419 Anxiety disorder, unspecified: Secondary | ICD-10-CM

## 2023-11-02 DIAGNOSIS — M7918 Myalgia, other site: Secondary | ICD-10-CM

## 2023-11-02 DIAGNOSIS — K2100 Gastro-esophageal reflux disease with esophagitis, without bleeding: Secondary | ICD-10-CM

## 2023-11-04 ENCOUNTER — Encounter: Payer: Self-pay | Admitting: Physician Assistant

## 2023-11-04 DIAGNOSIS — Z86711 Personal history of pulmonary embolism: Secondary | ICD-10-CM

## 2023-11-04 DIAGNOSIS — F5101 Primary insomnia: Secondary | ICD-10-CM

## 2023-11-04 NOTE — Telephone Encounter (Signed)
 Requesting rx rf of Eliquis  5mg   Last written 10/12/2022 Last OV 09/13/2023 Upcoming appt = none

## 2023-11-05 MED ORDER — ELIQUIS 5 MG PO TABS: 5.0000 mg | ORAL_TABLET | Freq: Two times a day (BID) | ORAL | 3 refills | Status: DC

## 2023-11-05 NOTE — Progress Notes (Signed)
   11/05/2023  Patient ID: Sarah Mayer, female   DOB: 01/19/1971, 53 y.o.   MRN: [id number]  Patient has been approved for Medicaid, so I have submitted a prior authorization request for Nurtec 75mg  to the plan via CoverMyMeds (KEY [id number]).  I will monitor progress of PA and keep patient and provider informed.  [name], PharmD, DPLA

## 2023-11-06 ENCOUNTER — Other Ambulatory Visit: Payer: Self-pay | Admitting: Physician Assistant

## 2023-11-06 DIAGNOSIS — Z86711 Personal history of pulmonary embolism: Secondary | ICD-10-CM

## 2023-11-08 ENCOUNTER — Telehealth: Payer: Self-pay

## 2023-11-08 NOTE — Telephone Encounter (Signed)
 Requesting rx rf of Eliquis  5mg   Last written 10/11/2023

## 2023-11-08 NOTE — Progress Notes (Signed)
   11/08/2023  Patient ID: Sarah Mayer, female   DOB: May 14, 1970, 53 y.o.   MRN: [id number]  Contacted [employer] Medicaid to submit appeal for denied prior authorization for Nurtec 75mg .  PA was denied stating patient must have first tried/failed rizatriptan, which she has; and this information was provided in the initial PA request.  Appeal request has been submitted - [id number] call reference number.  I will follow-up on the appeal status next week, but the plan did state it could take up to 30 days.  [name], PharmD, DPLA

## 2023-11-15 ENCOUNTER — Encounter: Payer: Self-pay | Admitting: Physician Assistant

## 2023-11-19 ENCOUNTER — Telehealth: Payer: Self-pay

## 2023-11-19 ENCOUNTER — Other Ambulatory Visit (HOSPITAL_COMMUNITY): Payer: Self-pay

## 2023-11-19 NOTE — Telephone Encounter (Signed)
 Pharmacy Patient Advocate Encounter   Received notification from Pt Calls Messages that prior authorization for Hydrocodone /APAP 10/325mg  tabs is required/requested.   Insurance verification completed.   The patient is insured through [employer] .   Per test claim: PA required; PA submitted to above mentioned insurance via Latent Key/confirmation #/EOC [facility] Status is pending

## 2023-11-22 ENCOUNTER — Encounter: Payer: Self-pay | Admitting: Physician Assistant

## 2023-11-22 ENCOUNTER — Other Ambulatory Visit (HOSPITAL_COMMUNITY): Payer: Self-pay

## 2023-11-22 DIAGNOSIS — G894 Chronic pain syndrome: Secondary | ICD-10-CM

## 2023-11-22 DIAGNOSIS — L4050 Arthropathic psoriasis, unspecified: Secondary | ICD-10-CM

## 2023-11-22 MED ORDER — HYDROCODONE-ACETAMINOPHEN 10-325 MG PO TABS: 1.0000 | ORAL_TABLET | Freq: Two times a day (BID) | ORAL | 0 refills | Status: AC

## 2023-11-22 NOTE — Telephone Encounter (Signed)
 Pharmacy Patient Advocate Encounter  Received notification from [employer] that Prior Authorization for Hydrocodone / APAP 10/325mg  has been APPROVED from 11/20/23 to 05/18/24   PA #/Case ID/Reference #: [id number]

## 2023-11-23 ENCOUNTER — Telehealth (INDEPENDENT_AMBULATORY_CARE_PROVIDER_SITE_OTHER): Admitting: Physician Assistant

## 2023-11-23 DIAGNOSIS — N3281 Overactive bladder: Secondary | ICD-10-CM | POA: Diagnosis not present

## 2023-11-23 DIAGNOSIS — J329 Chronic sinusitis, unspecified: Secondary | ICD-10-CM

## 2023-11-23 DIAGNOSIS — J40 Bronchitis, not specified as acute or chronic: Secondary | ICD-10-CM

## 2023-11-23 DIAGNOSIS — N3946 Mixed incontinence: Secondary | ICD-10-CM

## 2023-11-23 MED ORDER — PROMETHAZINE-DM 6.25-15 MG/5ML PO SYRP: 5.0000 mL | ORAL_SOLUTION | Freq: Four times a day (QID) | ORAL | 0 refills | Status: DC | PRN

## 2023-11-23 MED ORDER — ALBUTEROL SULFATE HFA 108 (90 BASE) MCG/ACT IN AERS: 2.0000 | INHALATION_SPRAY | Freq: Four times a day (QID) | RESPIRATORY_TRACT | 1 refills | Status: AC | PRN

## 2023-11-23 MED ORDER — METHYLPREDNISOLONE 4 MG PO TBPK: ORAL_TABLET | ORAL | 0 refills | Status: DC

## 2023-11-23 NOTE — Progress Notes (Addendum)
..  Virtual Visit via Video Note  I connected with Sarah Mayer on 01/25/24 at  2:40 PM EDT by a video enabled telemedicine application and verified that I am speaking with the correct person using two identifiers.  Location: Patient: home Provider: clinic  .SABRAParticipating in visit:  Patient: Sarah Mayer Provider: Vermell Bologna PA-C   I discussed the limitations of evaluation and management by telemedicine and the availability of in person appointments. The patient expressed understanding and agreed to proceed.  History of Present Illness: Pt is a 53 yo obese female with multiple health co-morbidies who calls into the clinic with cough, congestion, chest tightness, fatigue. Her husband has also been sick. Home covid and flu testing negative. She needs refills of albuterol  which is helping. No known fever. Cough is productive with clear to yellow sputum. Taking some mucinex OTC.     Observations/Objective: No acute distress Pale appearance Productive cough on video No wheezing Normal breathing  Assessment and Plan: Diagnoses and all orders for this visit:  Sinobronchitis -     Discontinue: methylPREDNISolone  (MEDROL  DOSEPAK) 4 MG TBPK tablet; Take as directed by package insert. -     Discontinue: promethazine -dextromethorphan (PROMETHAZINE -DM) 6.25-15 MG/5ML syrup; Take 5 mLs by mouth 4 (four) times daily as needed. -     albuterol  (VENTOLIN  HFA) 108 (90 Base) MCG/ACT inhaler; Inhale 2 puffs into the lungs every 6 (six) hours as needed for wheezing or shortness of breath.  OAB (overactive bladder)  Morbid obesity (HCC)  Mixed stress and urge urinary incontinence   Treated for sinobronchitis with medrol  dose pack, cough syrup, albuterol  refill Follow up as needed in office if not improving  Getting denial from [employer] for incontinence supplies. Pt is morbidly obese not ambulatory with OAB and urinary incontinence. She uses wheelchair hard to ambulate to bathroom in timely  manner. She needs urinary diapers to help keep skin dry and decrease chances of skin break down.    Follow Up Instructions:    I discussed the assessment and treatment plan with the patient. The patient was provided an opportunity to ask questions and all were answered. The patient agreed with the plan and demonstrated an understanding of the instructions.   The patient was advised to call back or seek an in-person evaluation if the symptoms worsen or if the condition fails to improve as anticipated.   Sarah Horrell, PA-C

## 2023-11-25 ENCOUNTER — Encounter: Payer: Self-pay | Admitting: Physician Assistant

## 2023-11-26 ENCOUNTER — Telehealth: Payer: Self-pay

## 2023-11-26 NOTE — Progress Notes (Signed)
   11/26/2023  Patient ID: Sarah Mayer, female   DOB: December 09, 1970, 53 y.o.   MRN: [id number]  Contacted [employer] Medicaid to follow-up on the appeal status for Nurtec 75mg .  Appeal is still in review with a due date of 10/16, so decision does not have to be made my insurance until that date.  Case # [id number].  Sending patient a MyChart message to make her aware that I will contact them again 10/16 if we have not received communication in regard to the Nurtec and to make sure she was able to pick up her hydrocodone /APAP.  [name], PharmD, DPLA

## 2023-11-29 ENCOUNTER — Telehealth: Payer: Self-pay

## 2023-11-29 ENCOUNTER — Other Ambulatory Visit (HOSPITAL_COMMUNITY): Payer: Self-pay

## 2023-11-29 NOTE — Telephone Encounter (Signed)
 Pharmacy Patient Advocate Encounter  Received notification from [facility] MEDICAID that Prior Authorization for Hydrocodone /APAP 10/325mg  has been APPROVED from 11/29/23 to 05/29/24   PA #/Case ID/Reference #: [id number]

## 2023-11-29 NOTE — Progress Notes (Signed)
   11/29/2023  Patient ID: Sarah Mayer, female   DOB: [birth year], 53 y.o.   MRN: [id number]  Patient has not been able to pick up hydrocodone /APAP 10/325mg  prescribed by PCP the end of September.  Prior authorization was approved by her commercial plan, but when the pharmacy tried to bill to it, coverage is no longer active.  [facility] Medicaid is active, but it will require a PA to cover the hydrocodone /APAP.  Coordinating with the PA team to work on this.  [name], PharmD, DPLA

## 2023-11-29 NOTE — Telephone Encounter (Signed)
 Pharmacy Patient Advocate Encounter   Received notification from Pt Calls Messages that prior authorization for Hydrocodone /APAP 10/325mg  is required/requested.   Insurance verification completed.   The patient is insured through [facility] MEDICAID.   Per test claim: PA required; PA submitted to above mentioned insurance via Latent Key/confirmation #/EOC [id number] Status is pending

## 2023-12-01 ENCOUNTER — Encounter: Payer: Self-pay | Admitting: Physician Assistant

## 2023-12-02 ENCOUNTER — Other Ambulatory Visit: Payer: Self-pay | Admitting: Physician Assistant

## 2023-12-02 DIAGNOSIS — K219 Gastro-esophageal reflux disease without esophagitis: Secondary | ICD-10-CM

## 2023-12-03 ENCOUNTER — Other Ambulatory Visit (HOSPITAL_COMMUNITY): Payer: Self-pay

## 2023-12-03 ENCOUNTER — Other Ambulatory Visit: Payer: Self-pay

## 2023-12-07 ENCOUNTER — Encounter: Payer: Self-pay | Admitting: Physician Assistant

## 2023-12-08 MED ORDER — FLUCONAZOLE 150 MG PO TABS: ORAL_TABLET | ORAL | 0 refills | Status: DC

## 2023-12-13 ENCOUNTER — Other Ambulatory Visit: Payer: Self-pay | Admitting: Physician Assistant

## 2023-12-13 DIAGNOSIS — L299 Pruritus, unspecified: Secondary | ICD-10-CM

## 2023-12-13 DIAGNOSIS — Z86711 Personal history of pulmonary embolism: Secondary | ICD-10-CM

## 2023-12-15 ENCOUNTER — Encounter: Payer: Self-pay | Admitting: Physician Assistant

## 2023-12-16 ENCOUNTER — Telehealth: Payer: Self-pay

## 2023-12-16 ENCOUNTER — Encounter: Payer: Self-pay | Admitting: Physician Assistant

## 2023-12-16 ENCOUNTER — Other Ambulatory Visit (HOSPITAL_COMMUNITY): Payer: Self-pay

## 2023-12-16 NOTE — Telephone Encounter (Signed)
 Pharmacy Patient Advocate Encounter   Received notification from CoverMyMeds that prior authorization for Nurtec 75mg  tabs is required/requested.   Insurance verification completed.   The patient is insured through [facility] MEDICAID.   Per test claim: PA required; PA submitted to above mentioned insurance via Latent Key/confirmation #/EOC [id number] Status is pending

## 2023-12-16 NOTE — Telephone Encounter (Signed)
 Pharmacy Patient Advocate Encounter  Received notification from [facility] MEDICAID that Prior Authorization for Nurtec 75mg  tabs has been APPROVED from 12/16/23 to 12/15/24   PA #/Case ID/Reference #: [id number]  Left a message at [facility] to notify of the approval

## 2023-12-17 NOTE — Telephone Encounter (Signed)
 Received notification from [facility] MEDICAID that Prior Authorization for Nurtec 75mg  tabs has been APPROVED from 12/16/23 to 12/15/24

## 2023-12-18 ENCOUNTER — Encounter: Payer: Self-pay | Admitting: Physician Assistant

## 2023-12-18 DIAGNOSIS — L4050 Arthropathic psoriasis, unspecified: Secondary | ICD-10-CM

## 2023-12-18 DIAGNOSIS — F329 Major depressive disorder, single episode, unspecified: Secondary | ICD-10-CM

## 2023-12-18 DIAGNOSIS — F410 Panic disorder [episodic paroxysmal anxiety] without agoraphobia: Secondary | ICD-10-CM

## 2023-12-18 DIAGNOSIS — N3281 Overactive bladder: Secondary | ICD-10-CM

## 2023-12-18 DIAGNOSIS — G894 Chronic pain syndrome: Secondary | ICD-10-CM

## 2023-12-20 ENCOUNTER — Other Ambulatory Visit (HOSPITAL_COMMUNITY): Payer: Self-pay

## 2023-12-20 ENCOUNTER — Telehealth: Payer: Self-pay

## 2023-12-20 ENCOUNTER — Encounter: Payer: Self-pay | Admitting: Physician Assistant

## 2023-12-20 MED ORDER — OXYBUTYNIN CHLORIDE ER 10 MG PO TB24: ORAL_TABLET | ORAL | 3 refills | Status: AC

## 2023-12-20 MED ORDER — GABAPENTIN 800 MG PO TABS: 800.0000 mg | ORAL_TABLET | Freq: Three times a day (TID) | ORAL | 1 refills | Status: AC

## 2023-12-20 MED ORDER — VORTIOXETINE HBR 20 MG PO TABS: 20.0000 mg | ORAL_TABLET | Freq: Every day | ORAL | 1 refills | Status: AC

## 2023-12-20 NOTE — Telephone Encounter (Signed)
 Pharmacy Patient Advocate Encounter   Received notification from CoverMyMeds that prior authorization for Rabeprazole  20mg  tabs is required/requested.   Insurance verification completed.   The patient is insured through [facility] MEDICAID.   Per test claim: PA required; PA submitted to above mentioned insurance via Latent Key/confirmation #/EOC [id number] Status is pending

## 2023-12-21 ENCOUNTER — Other Ambulatory Visit (HOSPITAL_COMMUNITY): Payer: Self-pay

## 2023-12-21 NOTE — Telephone Encounter (Signed)
 Pharmacy Patient Advocate Encounter  Received notification from [facility] MEDICAID that Prior Authorization for Rabeprazole  20mg  tabs has been DENIED.  See denial reason below. No denial letter attached in CMM. Will attach denial letter to Media tab once received.   PA #/Case ID/Reference #: [id number]    Resubmitted the PA over the phone because the patient has had failure to lansoprazole, omeprazole, and pantoprazole   Phone# [phone] Case ID: [id number]

## 2023-12-23 ENCOUNTER — Other Ambulatory Visit (HOSPITAL_COMMUNITY): Payer: Self-pay

## 2023-12-30 ENCOUNTER — Other Ambulatory Visit: Payer: Self-pay | Admitting: Physician Assistant

## 2023-12-30 ENCOUNTER — Other Ambulatory Visit (HOSPITAL_COMMUNITY): Payer: Self-pay

## 2023-12-30 DIAGNOSIS — F410 Panic disorder [episodic paroxysmal anxiety] without agoraphobia: Secondary | ICD-10-CM

## 2023-12-30 NOTE — Telephone Encounter (Signed)
 Pharmacy Patient Advocate Encounter  Received notification from [facility] MEDICAID that Prior Authorization for Rabeprazole  20mg  tabs has been APPROVED from 12/23/23 to 12/22/24. Ran test claim, Copay is $4. This test claim was processed through [facility]- copay amounts may vary at other pharmacies due to pharmacy/plan contracts, or as the patient moves through the different stages of their insurance plan.   PA #/Case ID/Reference #:  [id number]  Left a message at [facility] to notify of the approval

## 2024-01-02 ENCOUNTER — Other Ambulatory Visit: Payer: Self-pay | Admitting: Medical Genetics

## 2024-01-03 ENCOUNTER — Other Ambulatory Visit: Payer: Self-pay | Admitting: Medical Genetics

## 2024-01-11 NOTE — Telephone Encounter (Signed)
 Again attempted call to patient. Again left a voice mail message requesting a return call.

## 2024-01-11 NOTE — Telephone Encounter (Signed)
 In process of ordering wheelchair via parachute - will need hip measurement in inches.  Just from Left to Right - not around sides as well  Called patient and left a voice mail message requesting a return call.

## 2024-01-13 ENCOUNTER — Other Ambulatory Visit: Payer: Self-pay | Admitting: Physician Assistant

## 2024-01-13 DIAGNOSIS — L299 Pruritus, unspecified: Secondary | ICD-10-CM

## 2024-01-13 NOTE — Telephone Encounter (Signed)
 Awaiting completion or prior auth by provider - I will then enter this into parachute and complete process.

## 2024-01-15 ENCOUNTER — Encounter: Payer: Self-pay | Admitting: Physician Assistant

## 2024-01-15 DIAGNOSIS — L4050 Arthropathic psoriasis, unspecified: Secondary | ICD-10-CM

## 2024-01-15 DIAGNOSIS — G894 Chronic pain syndrome: Secondary | ICD-10-CM

## 2024-01-17 NOTE — Telephone Encounter (Signed)
 She was supposed to f/u in Sept with Jade for chronic pain med.  We sent in October so she could schedule.

## 2024-01-18 NOTE — Telephone Encounter (Signed)
 Attempted call to patient. Left a voice mail message requesting a return call.

## 2024-01-19 ENCOUNTER — Telehealth: Payer: Self-pay

## 2024-01-19 ENCOUNTER — Other Ambulatory Visit (HOSPITAL_COMMUNITY): Payer: Self-pay

## 2024-01-19 NOTE — Telephone Encounter (Signed)
 Pharmacy Patient Advocate Encounter  Received notification from [employer] that Prior Authorization for Trintellix  (formerly Brintellix) 20MG  tablets  has been APPROVED from 01/19/24 to 01/18/25. Ran test claim, Copay is $4. This test claim was processed through [facility]- copay amounts may vary at other pharmacies due to pharmacy/plan contracts, or as the patient moves through the different stages of their insurance plan.   PA #/Case ID/Reference #: [id number]

## 2024-01-19 NOTE — Telephone Encounter (Signed)
 Last read by Allean Piety at 10:23AM on 01/18/2024.

## 2024-01-19 NOTE — Telephone Encounter (Signed)
 Pharmacy Patient Advocate Encounter   Received notification from [employer] that prior authorization for Trintellix  (formerly Brintellix) 20MG  tablets is required/requested.   Insurance verification completed.   The patient is insured through [employer].   Per test claim: PA required; PA submitted to above mentioned insurance via Latent Key/confirmation #/EOC [facility] Status is pending

## 2024-01-25 DIAGNOSIS — N3946 Mixed incontinence: Secondary | ICD-10-CM | POA: Insufficient documentation

## 2024-01-28 ENCOUNTER — Telehealth: Payer: Self-pay

## 2024-01-28 ENCOUNTER — Encounter: Payer: Self-pay | Admitting: Physician Assistant

## 2024-01-28 NOTE — Telephone Encounter (Unsigned)
 Copied from CRM #[id number]. Topic: Clinical - Prescription Issue >> Jan 27, 2024  3:18 PM Emylou G wrote: Reason for CRM: [facility] called.. said they don't do heavy duty wheel chair.SABRA to please send to another place

## 2024-01-28 NOTE — Telephone Encounter (Signed)
 Can you help with where to send?

## 2024-01-31 NOTE — Telephone Encounter (Signed)
 I think order was canceled because they did not do extra large wheelchair. Can we submit for extra large wheelchair for patient?

## 2024-02-01 NOTE — Telephone Encounter (Signed)
 Order placed again through [employer] - when I called [employer] - was told that they do accept pt insurance , they do deliver to [place] and they do have HDW but that this patient would likely not need but a standard wheelchair so this was ordered in it's place.

## 2024-02-02 NOTE — Telephone Encounter (Signed)
 Will patient be able to fit in a standard wheelchair?

## 2024-02-02 NOTE — Telephone Encounter (Signed)
 Yes

## 2024-02-02 NOTE — Telephone Encounter (Signed)
 Sent parachute/[employer] a message and requesting a return call to discuss concerns over which wheelchair the patient will receive. Awaiting a response.

## 2024-02-03 NOTE — Telephone Encounter (Signed)
 Order was resubmitted for heavy duty wheelchair and sent via parachute.

## 2024-02-09 ENCOUNTER — Ambulatory Visit: Admitting: Physician Assistant

## 2024-02-09 VITALS — BP 128/71 | HR 64 | Ht 60.0 in

## 2024-02-09 DIAGNOSIS — F410 Panic disorder [episodic paroxysmal anxiety] without agoraphobia: Secondary | ICD-10-CM

## 2024-02-09 DIAGNOSIS — Z23 Encounter for immunization: Secondary | ICD-10-CM | POA: Diagnosis not present

## 2024-02-09 DIAGNOSIS — Z79899 Other long term (current) drug therapy: Secondary | ICD-10-CM

## 2024-02-09 DIAGNOSIS — F329 Major depressive disorder, single episode, unspecified: Secondary | ICD-10-CM | POA: Diagnosis not present

## 2024-02-09 DIAGNOSIS — G894 Chronic pain syndrome: Secondary | ICD-10-CM | POA: Diagnosis not present

## 2024-02-09 DIAGNOSIS — L4050 Arthropathic psoriasis, unspecified: Secondary | ICD-10-CM | POA: Diagnosis not present

## 2024-02-09 DIAGNOSIS — Z86711 Personal history of pulmonary embolism: Secondary | ICD-10-CM

## 2024-02-09 DIAGNOSIS — N3281 Overactive bladder: Secondary | ICD-10-CM

## 2024-02-09 DIAGNOSIS — N3946 Mixed incontinence: Secondary | ICD-10-CM

## 2024-02-09 DIAGNOSIS — G43011 Migraine without aura, intractable, with status migrainosus: Secondary | ICD-10-CM

## 2024-02-09 MED ORDER — NURTEC 75 MG PO TBDP: ORAL_TABLET | ORAL | 5 refills | Status: AC

## 2024-02-09 MED ORDER — KETOROLAC TROMETHAMINE 60 MG/2ML IM SOLN
60.0000 mg | Freq: Once | INTRAMUSCULAR | Status: AC
  Administered 2024-02-09: 16:00:00 60 mg via INTRAMUSCULAR

## 2024-02-09 NOTE — Progress Notes (Unsigned)
 Established Patient Office Visit  Subjective   Patient ID: Sarah Mayer, female    DOB: December 15, 1970  Age: 53 y.o. MRN: [id number]  Chief Complaint  Patient presents with   Medical Management of Chronic Issues    HPI .SABRADiscussed the use of AI scribe software for clinical note transcription with the patient, who gave verbal consent to proceed.  History of Present Illness Sarah Mayer is a 53 year old female who presents for medication refills and insurance-related issues.  Medication access and adherence - Difficulty managing medications due to insurance changes and prior authorization issues. - Currently on Otezla but unable to take all prescribed medications, including hydroxychloroquine and azathioprine, due to access challenges. - Loss of previous insurance subsidies; now on Medicaid, complicating medication access.  Pain management - Hydrocodone  taken at least once daily, usually twice, for pain control. - Gabapentin  and Cymbalta  also used for pain management.  Anticoagulation concerns - Off Eliquis  for approximately six weeks. - Concerned about risk of blood clots, referencing prior pulmonary embolism.  Migraine management - Migraines managed with Nurtec as a rescue medication; effective about half the time. - Propranolol  used as a preventative agent.  Urinary incontinence - Continuous urinary leakage requiring incontinence supplies. - Difficulty obtaining supplies through Medicaid due to paperwork issues. - Leakage worsened by stress, urge, sneezing, and coughing.  Low back pain - Intermittent low back pain worsened by certain positions. [place], shooting pain radiates into buttocks but not down legs. - Pain impairs ambulation and stretching.  Laboratory testing - No recent laboratory work completed. - Interest in [facility] lab work; registered but not yet contacted.    ROS See HPI.    Objective:     There were no vitals taken for this  visit. BP Readings from Last 3 Encounters:  09/13/23 117/84  08/03/23 107/70  05/31/23 105/63   Wt Readings from Last 3 Encounters:  09/13/23 259 lb (117.5 kg)  08/03/23 259 lb (117.5 kg)  12/01/22 259 lb (117.5 kg)      Physical Exam    The 10-year ASCVD risk score (Arnett DK, et al., 2019) is: 1%    Assessment & Plan:  SABRASABRAKristin was seen today for medical management of chronic issues.  Diagnoses and all orders for this visit:  Immunization due -     Flu vaccine trivalent PF, 6mos and older(Flulaval,Afluria,Fluarix,Fluzone) -     Pfizer Comirnaty Covid-19 Vaccine 58yrs & older -     Pneumococcal conjugate vaccine 20-valent (Prevnar 20)  Psoriatic arthritis (HCC) -     CMP14+EGFR  Chronic pain syndrome  Panic attacks  OAB (overactive bladder)  Major depressive disorder with current active episode, unspecified depression episode severity, unspecified whether recurrent -     CMP14+EGFR  History of pulmonary embolus (PE) -     D-dimer, quantitative  Morbid obesity (HCC)  Mixed stress and urge urinary incontinence -     CMP14+EGFR  Medication management -     D-dimer, quantitative -     CMP14+EGFR -     CBC w/Diff/Platelet  Other orders -     Rimegepant Sulfate (NURTEC) 75 MG TBDP; Take one tablet every other day for migraine prevention.   SABRA.Assessment and Plan Assessment & Plan Mixed urinary incontinence Continuous leakage worsened by stress and urge. Managed with maximum dose oxybutynin . Issues with Medicaid paperwork for supplies. - Sent notes to Medicaid for documentation. - Check if [facility] saved previous paperwork for supplies. - Consider TENS unit for pain management.  Chronic low back pain Intermittent sharp pain into buttocks, affecting mobility. - Encouraged TENS unit use. - Encouraged Biofreeze for relief.  Rheumatoid arthritis Managed with Otezla. Issues with hydroxychloroquine and azathioprine due to authorization and refill problems. -  Continue Otezla. - Address authorization and refill issues for hydroxychloroquine and azathioprine.  Migraine Managed with Nurtec, propranolol , gabapentin , and Cymbalta . Nurtec effective as rescue, variable as preventative. Insurance issues with Nurtec. - Sent Nurtec prescription for preventative use. - Continue propranolol , gabapentin , and Cymbalta .  History of venous thromboembolism Off Eliquis  for six weeks. No symptoms but concern for clot formation. - Ordered D-dimer test.  General health maintenance Blood pressure controlled. Oxygen saturation normal. No need for hepatitis B vaccination. - Continue current regimen.     No follow-ups on file.    Kirstan Fentress, PA-C

## 2024-02-10 ENCOUNTER — Ambulatory Visit: Payer: Self-pay | Admitting: Physician Assistant

## 2024-02-10 LAB — CMP14+EGFR
ALT: 12 IU/L (ref 0–32)
AST: 9 IU/L (ref 0–40)
Albumin: 4.2 g/dL (ref 3.8–4.9)
Alkaline Phosphatase: 133 IU/L (ref 49–135)
BUN/Creatinine Ratio: 10 (ref 9–23)
BUN: 10 mg/dL (ref 6–24)
Bilirubin Total: 0.4 mg/dL (ref 0.0–1.2)
CO2: 24 mmol/L (ref 20–29)
Calcium: 9.6 mg/dL (ref 8.7–10.2)
Chloride: 104 mmol/L (ref 96–106)
Creatinine, Ser: 0.98 mg/dL (ref 0.57–1.00)
Globulin, Total: 1.7 g/dL (ref 1.5–4.5)
Glucose: 92 mg/dL (ref 70–99)
Potassium: 4.6 mmol/L (ref 3.5–5.2)
Sodium: 141 mmol/L (ref 134–144)
Total Protein: 5.9 g/dL — ABNORMAL LOW (ref 6.0–8.5)
eGFR: 69 mL/min/1.73 (ref >59)

## 2024-02-10 LAB — CBC WITH DIFFERENTIAL/PLATELET
Basophils Absolute: 0.1 x10E3/uL (ref 0.0–0.2)
Basos: 1 %
EOS (ABSOLUTE): 0.2 x10E3/uL (ref 0.0–0.4)
Eos: 2 %
Hematocrit: 41.4 % (ref 34.0–46.6)
Hemoglobin: 13.3 g/dL (ref 11.1–15.9)
Immature Grans (Abs): 0.1 x10E3/uL (ref 0.0–0.1)
Immature Granulocytes: 1 %
Lymphocytes Absolute: 2.9 x10E3/uL (ref 0.7–3.1)
Lymphs: 27 %
MCH: 28.1 pg (ref 26.6–33.0)
MCHC: 32.1 g/dL (ref 31.5–35.7)
MCV: 88 fL (ref 79–97)
Monocytes Absolute: 0.7 x10E3/uL (ref 0.1–0.9)
Monocytes: 7 %
Neutrophils Absolute: 6.8 x10E3/uL (ref 1.4–7.0)
Neutrophils: 62 %
Platelets: 317 x10E3/uL (ref 150–450)
RBC: 4.73 x10E6/uL (ref 3.77–5.28)
RDW: 11.8 % (ref 11.7–15.4)
WBC: 10.7 x10E3/uL (ref 3.4–10.8)

## 2024-02-10 LAB — D-DIMER, QUANTITATIVE: D-DIMER: <0.2 mg{FEU}/L (ref 0.00–0.49)

## 2024-02-10 NOTE — Progress Notes (Signed)
 Sarah Mayer,   Total protein is low. Increase protein in diet.  D-dimer normal. No increased risk of blood clot.  Hemoglobin and WBC look great.

## 2024-02-11 ENCOUNTER — Encounter: Payer: Self-pay | Admitting: Physician Assistant

## 2024-02-11 MED ORDER — HYDROCODONE-ACETAMINOPHEN 10-325 MG PO TABS: 1.0000 | ORAL_TABLET | Freq: Two times a day (BID) | ORAL | 0 refills | Status: AC

## 2024-02-23 ENCOUNTER — Other Ambulatory Visit: Payer: Self-pay | Admitting: Physician Assistant

## 2024-02-23 DIAGNOSIS — G43011 Migraine without aura, intractable, with status migrainosus: Secondary | ICD-10-CM

## 2024-03-04 ENCOUNTER — Other Ambulatory Visit: Payer: Self-pay | Admitting: Physician Assistant

## 2024-03-04 DIAGNOSIS — Z86711 Personal history of pulmonary embolism: Secondary | ICD-10-CM

## 2024-03-16 ENCOUNTER — Encounter: Payer: Self-pay | Admitting: Physician Assistant

## 2024-03-16 ENCOUNTER — Other Ambulatory Visit: Payer: Self-pay | Admitting: Medical Genetics

## 2024-03-16 DIAGNOSIS — Z006 Encounter for examination for normal comparison and control in clinical research program: Secondary | ICD-10-CM

## 2024-03-17 MED ORDER — WEGOVY 0.25 MG/0.5ML ~~LOC~~ SOAJ: 0.2500 mg | SUBCUTANEOUS | 0 refills | Status: AC

## 2024-03-21 ENCOUNTER — Telehealth: Payer: Self-pay | Admitting: Pharmacist

## 2024-03-21 ENCOUNTER — Other Ambulatory Visit (HOSPITAL_COMMUNITY): Payer: Self-pay

## 2024-03-21 NOTE — Telephone Encounter (Signed)
 Pharmacy Patient Advocate [employer] companies are becoming increasingly stricter about requiring thorough documentation of lifestyle modifications in the patient's chart at each visit. This includes detailed records of diet recommendations (caloric intake, etc), exercise plans (amount of time/wk, etc), and an emphasis on the patient's commitment to continuing these efforts while on medication.  Without this additional documentation in the chart notes, a prior authorization will most likely be denied.   Please advise.  Thank you, Devere Pandy, PharmD Clinical Pharmacist  [place]  Direct Dial: [phone]

## 2024-05-09 ENCOUNTER — Ambulatory Visit: Admitting: Physician Assistant
